# Patient Record
Sex: Female | Born: 1955
Health system: Southern US, Community
[De-identification: ages and names within clinical notes are randomized; demographics above are authoritative.]

## PROBLEM LIST (undated history)

## (undated) DIAGNOSIS — H269 Unspecified cataract: Secondary | ICD-10-CM

## (undated) DIAGNOSIS — I10 Essential (primary) hypertension: Secondary | ICD-10-CM

## (undated) DIAGNOSIS — H409 Unspecified glaucoma: Secondary | ICD-10-CM

## (undated) DIAGNOSIS — E119 Type 2 diabetes mellitus without complications: Secondary | ICD-10-CM

## (undated) DIAGNOSIS — R011 Cardiac murmur, unspecified: Secondary | ICD-10-CM

## (undated) DIAGNOSIS — E7800 Pure hypercholesterolemia, unspecified: Secondary | ICD-10-CM

## (undated) DIAGNOSIS — H543 Unqualified visual loss, both eyes: Secondary | ICD-10-CM

## (undated) DIAGNOSIS — K219 Gastro-esophageal reflux disease without esophagitis: Secondary | ICD-10-CM

## (undated) HISTORY — DX: Gastro-esophageal reflux disease without esophagitis: K21.9

## (undated) HISTORY — PX: TUBAL LIGATION: SHX77

## (undated) HISTORY — DX: Unspecified glaucoma: H40.9

## (undated) HISTORY — DX: Cardiac murmur, unspecified: R01.1

## (undated) HISTORY — DX: Unqualified visual loss, both eyes: H54.3

## (undated) HISTORY — DX: Pure hypercholesterolemia, unspecified: E78.00

## (undated) HISTORY — DX: Type 2 diabetes mellitus without complications: E11.9

## (undated) HISTORY — DX: Unspecified cataract: H26.9

## (undated) HISTORY — PX: CATARACT EXTRACTION, BILATERAL: SHX1313

---

## 1997-05-28 ENCOUNTER — Ambulatory Visit (HOSPITAL_COMMUNITY): Admission: RE | Admit: 1997-05-28 | Discharge: 1997-05-28 | Payer: Self-pay | Admitting: Internal Medicine

## 1997-10-31 ENCOUNTER — Ambulatory Visit (HOSPITAL_COMMUNITY): Admission: RE | Admit: 1997-10-31 | Discharge: 1997-10-31 | Payer: Self-pay | Admitting: Obstetrics and Gynecology

## 1998-04-22 ENCOUNTER — Encounter: Payer: Self-pay | Admitting: Internal Medicine

## 1998-04-22 ENCOUNTER — Ambulatory Visit (HOSPITAL_COMMUNITY): Admission: RE | Admit: 1998-04-22 | Discharge: 1998-04-22 | Payer: Self-pay | Admitting: Internal Medicine

## 1998-05-05 ENCOUNTER — Encounter: Payer: Self-pay | Admitting: Obstetrics and Gynecology

## 1998-05-05 ENCOUNTER — Ambulatory Visit (HOSPITAL_COMMUNITY): Admission: RE | Admit: 1998-05-05 | Discharge: 1998-05-05 | Payer: Self-pay | Admitting: Obstetrics and Gynecology

## 1999-04-26 ENCOUNTER — Encounter: Payer: Self-pay | Admitting: Geriatric Medicine

## 1999-04-26 ENCOUNTER — Ambulatory Visit (HOSPITAL_COMMUNITY): Admission: RE | Admit: 1999-04-26 | Discharge: 1999-04-26 | Payer: Self-pay | Admitting: Geriatric Medicine

## 2000-06-26 ENCOUNTER — Ambulatory Visit (HOSPITAL_COMMUNITY): Admission: RE | Admit: 2000-06-26 | Discharge: 2000-06-26 | Payer: Self-pay | Admitting: Internal Medicine

## 2000-06-26 ENCOUNTER — Encounter: Payer: Self-pay | Admitting: Internal Medicine

## 2001-01-12 ENCOUNTER — Other Ambulatory Visit: Admission: RE | Admit: 2001-01-12 | Discharge: 2001-01-12 | Payer: Self-pay | Admitting: Obstetrics and Gynecology

## 2001-07-02 ENCOUNTER — Ambulatory Visit (HOSPITAL_COMMUNITY): Admission: RE | Admit: 2001-07-02 | Discharge: 2001-07-02 | Payer: Self-pay | Admitting: Internal Medicine

## 2001-07-02 ENCOUNTER — Encounter: Payer: Self-pay | Admitting: Internal Medicine

## 2002-01-16 ENCOUNTER — Other Ambulatory Visit: Admission: RE | Admit: 2002-01-16 | Discharge: 2002-01-16 | Payer: Self-pay | Admitting: Obstetrics and Gynecology

## 2002-08-13 ENCOUNTER — Encounter: Payer: Self-pay | Admitting: Internal Medicine

## 2002-08-13 ENCOUNTER — Ambulatory Visit (HOSPITAL_COMMUNITY): Admission: RE | Admit: 2002-08-13 | Discharge: 2002-08-13 | Payer: Self-pay | Admitting: Internal Medicine

## 2003-01-28 ENCOUNTER — Other Ambulatory Visit: Admission: RE | Admit: 2003-01-28 | Discharge: 2003-01-28 | Payer: Self-pay | Admitting: Obstetrics and Gynecology

## 2003-07-16 ENCOUNTER — Encounter: Admission: RE | Admit: 2003-07-16 | Discharge: 2003-07-16 | Payer: Self-pay | Admitting: Internal Medicine

## 2003-08-15 ENCOUNTER — Other Ambulatory Visit: Admission: RE | Admit: 2003-08-15 | Discharge: 2003-08-15 | Payer: Self-pay | Admitting: *Deleted

## 2004-02-05 ENCOUNTER — Other Ambulatory Visit: Admission: RE | Admit: 2004-02-05 | Discharge: 2004-02-05 | Payer: Self-pay | Admitting: Obstetrics and Gynecology

## 2004-08-09 ENCOUNTER — Ambulatory Visit (HOSPITAL_COMMUNITY): Admission: RE | Admit: 2004-08-09 | Discharge: 2004-08-09 | Payer: Self-pay | Admitting: Internal Medicine

## 2005-10-18 ENCOUNTER — Ambulatory Visit (HOSPITAL_COMMUNITY): Admission: RE | Admit: 2005-10-18 | Discharge: 2005-10-18 | Payer: Self-pay | Admitting: Internal Medicine

## 2007-05-24 ENCOUNTER — Ambulatory Visit (HOSPITAL_COMMUNITY): Admission: RE | Admit: 2007-05-24 | Discharge: 2007-05-24 | Payer: Self-pay | Admitting: Internal Medicine

## 2009-08-11 ENCOUNTER — Ambulatory Visit (HOSPITAL_COMMUNITY): Admission: RE | Admit: 2009-08-11 | Discharge: 2009-08-11 | Payer: Self-pay | Admitting: Internal Medicine

## 2009-08-13 ENCOUNTER — Encounter: Admission: RE | Admit: 2009-08-13 | Discharge: 2009-08-13 | Payer: Self-pay | Admitting: Internal Medicine

## 2010-01-17 HISTORY — PX: EYE SURGERY: SHX253

## 2010-02-07 ENCOUNTER — Encounter: Payer: Self-pay | Admitting: Internal Medicine

## 2010-08-24 ENCOUNTER — Encounter: Payer: Self-pay | Admitting: Dietician

## 2010-08-26 NOTE — Progress Notes (Deleted)
  Medical Nutrition Therapy:  Appt start time: {Time; Appointment:21385} end time:  {Time; Appointment:21385}.   Assessment:  Primary concerns today: ***.   MEDICATIONS: ***   DIETARY INTAKE:  Usual eating pattern includes *** meals and *** snacks per day.  Everyday foods include ***.  Avoided foods include ***.    24-hr recall:  B ( AM): ***  Snk ( AM): ***  L ( PM): *** Snk ( PM): *** D ( PM): *** Snk ( PM): ***  Usual physical activity: ***  Estimated energy needs: *** calories *** g carbohydrates *** g protein *** g fat  Progress Towards Goal(s):  {Desc; Goals Progress:21388}.   Nutritional Diagnosis:  {CHL AMB NUTRITIONAL [phone]}    Intervention:  Nutrition ***.  Monitoring/Evaluation:  Dietary intake, exercise, ***, and body weight {follow up:15908}.

## 2013-01-17 DIAGNOSIS — I639 Cerebral infarction, unspecified: Secondary | ICD-10-CM

## 2013-01-17 HISTORY — DX: Cerebral infarction, unspecified: I63.9

## 2013-05-17 ENCOUNTER — Encounter (INDEPENDENT_AMBULATORY_CARE_PROVIDER_SITE_OTHER): Payer: 59 | Admitting: Ophthalmology

## 2013-05-17 DIAGNOSIS — E1139 Type 2 diabetes mellitus with other diabetic ophthalmic complication: Secondary | ICD-10-CM

## 2013-05-17 DIAGNOSIS — E11319 Type 2 diabetes mellitus with unspecified diabetic retinopathy without macular edema: Secondary | ICD-10-CM

## 2013-05-17 DIAGNOSIS — H4310 Vitreous hemorrhage, unspecified eye: Secondary | ICD-10-CM

## 2013-05-17 DIAGNOSIS — H43819 Vitreous degeneration, unspecified eye: Secondary | ICD-10-CM

## 2013-05-17 DIAGNOSIS — E1165 Type 2 diabetes mellitus with hyperglycemia: Secondary | ICD-10-CM

## 2013-06-14 ENCOUNTER — Encounter (INDEPENDENT_AMBULATORY_CARE_PROVIDER_SITE_OTHER): Payer: 59 | Admitting: Ophthalmology

## 2013-06-14 DIAGNOSIS — E11311 Type 2 diabetes mellitus with unspecified diabetic retinopathy with macular edema: Secondary | ICD-10-CM

## 2013-06-14 DIAGNOSIS — H43819 Vitreous degeneration, unspecified eye: Secondary | ICD-10-CM

## 2013-06-14 DIAGNOSIS — E1165 Type 2 diabetes mellitus with hyperglycemia: Secondary | ICD-10-CM

## 2013-06-14 DIAGNOSIS — E1139 Type 2 diabetes mellitus with other diabetic ophthalmic complication: Secondary | ICD-10-CM

## 2013-06-14 DIAGNOSIS — E11359 Type 2 diabetes mellitus with proliferative diabetic retinopathy without macular edema: Secondary | ICD-10-CM

## 2013-07-12 ENCOUNTER — Encounter (INDEPENDENT_AMBULATORY_CARE_PROVIDER_SITE_OTHER): Payer: 59 | Admitting: Ophthalmology

## 2013-07-12 DIAGNOSIS — E1139 Type 2 diabetes mellitus with other diabetic ophthalmic complication: Secondary | ICD-10-CM

## 2013-07-12 DIAGNOSIS — H43819 Vitreous degeneration, unspecified eye: Secondary | ICD-10-CM

## 2013-07-12 DIAGNOSIS — E11311 Type 2 diabetes mellitus with unspecified diabetic retinopathy with macular edema: Secondary | ICD-10-CM

## 2013-07-12 DIAGNOSIS — E1165 Type 2 diabetes mellitus with hyperglycemia: Secondary | ICD-10-CM

## 2013-07-12 DIAGNOSIS — E11359 Type 2 diabetes mellitus with proliferative diabetic retinopathy without macular edema: Secondary | ICD-10-CM

## 2013-07-12 DIAGNOSIS — H4310 Vitreous hemorrhage, unspecified eye: Secondary | ICD-10-CM

## 2013-07-26 ENCOUNTER — Other Ambulatory Visit (INDEPENDENT_AMBULATORY_CARE_PROVIDER_SITE_OTHER): Payer: 59 | Admitting: Ophthalmology

## 2013-07-26 DIAGNOSIS — H3581 Retinal edema: Secondary | ICD-10-CM

## 2013-08-02 ENCOUNTER — Other Ambulatory Visit (INDEPENDENT_AMBULATORY_CARE_PROVIDER_SITE_OTHER): Payer: 59 | Admitting: Ophthalmology

## 2013-08-02 DIAGNOSIS — E1139 Type 2 diabetes mellitus with other diabetic ophthalmic complication: Secondary | ICD-10-CM

## 2013-08-02 DIAGNOSIS — E11359 Type 2 diabetes mellitus with proliferative diabetic retinopathy without macular edema: Secondary | ICD-10-CM

## 2013-08-02 DIAGNOSIS — E1165 Type 2 diabetes mellitus with hyperglycemia: Secondary | ICD-10-CM

## 2013-08-23 ENCOUNTER — Other Ambulatory Visit (INDEPENDENT_AMBULATORY_CARE_PROVIDER_SITE_OTHER): Payer: 59 | Admitting: Ophthalmology

## 2013-08-23 DIAGNOSIS — E11359 Type 2 diabetes mellitus with proliferative diabetic retinopathy without macular edema: Secondary | ICD-10-CM

## 2013-08-23 DIAGNOSIS — E1139 Type 2 diabetes mellitus with other diabetic ophthalmic complication: Secondary | ICD-10-CM

## 2013-08-23 DIAGNOSIS — E1165 Type 2 diabetes mellitus with hyperglycemia: Secondary | ICD-10-CM

## 2013-08-30 ENCOUNTER — Other Ambulatory Visit (INDEPENDENT_AMBULATORY_CARE_PROVIDER_SITE_OTHER): Payer: 59 | Admitting: Ophthalmology

## 2013-08-30 DIAGNOSIS — E11359 Type 2 diabetes mellitus with proliferative diabetic retinopathy without macular edema: Secondary | ICD-10-CM

## 2013-08-30 DIAGNOSIS — E1165 Type 2 diabetes mellitus with hyperglycemia: Secondary | ICD-10-CM

## 2013-08-30 DIAGNOSIS — E1139 Type 2 diabetes mellitus with other diabetic ophthalmic complication: Secondary | ICD-10-CM

## 2013-10-25 ENCOUNTER — Encounter (INDEPENDENT_AMBULATORY_CARE_PROVIDER_SITE_OTHER): Payer: 59 | Admitting: Ophthalmology

## 2013-10-25 DIAGNOSIS — E10311 Type 1 diabetes mellitus with unspecified diabetic retinopathy with macular edema: Secondary | ICD-10-CM

## 2013-10-25 DIAGNOSIS — E10351 Type 1 diabetes mellitus with proliferative diabetic retinopathy with macular edema: Secondary | ICD-10-CM

## 2013-10-31 ENCOUNTER — Other Ambulatory Visit: Payer: Self-pay | Admitting: Family Medicine

## 2013-10-31 DIAGNOSIS — H5461 Unqualified visual loss, right eye, normal vision left eye: Secondary | ICD-10-CM

## 2013-11-01 ENCOUNTER — Ambulatory Visit
Admission: RE | Admit: 2013-11-01 | Discharge: 2013-11-01 | Disposition: A | Discharge status: Home / Self Care | Payer: 59 | Source: Ambulatory Visit | Attending: Family Medicine | Admitting: Family Medicine

## 2013-11-01 DIAGNOSIS — H5461 Unqualified visual loss, right eye, normal vision left eye: Secondary | ICD-10-CM

## 2013-11-04 ENCOUNTER — Encounter: Payer: Self-pay | Admitting: *Deleted

## 2013-11-05 ENCOUNTER — Encounter (INDEPENDENT_AMBULATORY_CARE_PROVIDER_SITE_OTHER): Payer: Self-pay

## 2013-11-05 ENCOUNTER — Encounter: Payer: Self-pay | Admitting: Neurology

## 2013-11-05 ENCOUNTER — Ambulatory Visit (INDEPENDENT_AMBULATORY_CARE_PROVIDER_SITE_OTHER): Payer: 59 | Admitting: Neurology

## 2013-11-05 ENCOUNTER — Ambulatory Visit
Admission: RE | Admit: 2013-11-05 | Discharge: 2013-11-05 | Disposition: A | Discharge status: Home / Self Care | Payer: 59 | Source: Ambulatory Visit | Attending: Family Medicine | Admitting: Family Medicine

## 2013-11-05 ENCOUNTER — Ambulatory Visit: Admission: RE | Admit: 2013-11-05 | Payer: 59 | Source: Ambulatory Visit

## 2013-11-05 VITALS — BP 142/80 | HR 85 | Ht 67.0 in | Wt 180.0 lb

## 2013-11-05 DIAGNOSIS — E11349 Type 2 diabetes mellitus with severe nonproliferative diabetic retinopathy without macular edema: Secondary | ICD-10-CM

## 2013-11-05 DIAGNOSIS — H3411 Central retinal artery occlusion, right eye: Secondary | ICD-10-CM

## 2013-11-05 DIAGNOSIS — H5461 Unqualified visual loss, right eye, normal vision left eye: Secondary | ICD-10-CM

## 2013-11-05 DIAGNOSIS — E785 Hyperlipidemia, unspecified: Secondary | ICD-10-CM

## 2013-11-05 NOTE — Patient Instructions (Signed)
-   continue ASA 325 mg and crestor for stroke prevention - DM control is the key - Please follow up with your primary care physician for stroke risk factor modification. - please let me know the result of MRI and carotid doppler and echo and we will give you more recommendations after that - continue to follow up with your eye doctor for visual improvement - follow up in 2 weeks.

## 2013-11-05 NOTE — Progress Notes (Signed)
NEUROLOGY CLINIC NEW PATIENT NOTE  NAME: Adrienne [name] DOB: 1955/08/26  I saw Adrienne [name] as a new patient in the neurovascular clinic today regarding  Chief Complaint  Patient presents with  . New Evaluation    RM 4  . Cerebrovascular Accident  .  HPI: Adrienne [name] is a 58 y.o. female with PMH of DM and HLD who presents as a new patient for right eye vision loss.   Patient stated that the on October 8, she was working and suddenly she felt right eye blurry vision, which lasted for 2 hours and cleared up. However it returned in the evening, she did not seek for any medical attention and went to sleep. When she woke up in the morning second day, she still had the right eye blurry vision as well as right ptosis. She went to see her ophthalmologist Dr. Zigmund Daniel, and her ptosis gradually getting better during the clinic visit. Dr. Zigmund Daniel refer her to see could not ophthalmologist in [place], and was told to have a CRAO. She was recommended to have further work up for stroke. Her PCP has ordered MRI and MRA, carotid doppler and echo with stroke lab. She was sent here for further evaluation. For these days, she stated that that her right high blurry vision still continues, with intermittent right orbit pain. Denies any speech difficulty, weakness, numbness, loss of consciousness, or headache.  She had history of diabetes since 1997, she is on insulin with Lantus and night and pre-meal insulin at dinner. She had bilateral diabetic retinopathy, left eye worse than right eye, at baseline she has poor vision on the left eye, but relatively good on the right side. She had later surgery both eye in 2012 for retinopathy.  She had a hyperlipidemia, on Crestor. She denies hypertension, her blood pressure 142/80 today.  Family history, father has carotid disease decreased status post bypass surgery, mom had stroke, heart attack. Siblings have diabetes and hypertension.  She has quit smoking  and alcohol in 1997, use of cocaine a couple times in the past, denies any other illicit drug use.   She had another appointment with her ophthalmology [facility] on October 27.  10/27 see in [place]  Past Medical History  Diagnosis Date  . Diabetes   . High cholesterol    Past Surgical History  Procedure Laterality Date  . Cesarean section      Twins, Breech  . Tubal ligation Bilateral   . Eye surgery Bilateral 2012    LASER, CATARACT   Family History  Problem Relation Age of Onset  . Diabetes Father   . Hypertension Father   . Dementia Mother   . Hypertension Sister   . Diabetes Sister   . Diabetes Sister   . Heart disease Sister    Current Outpatient Prescriptions  Medication Sig Dispense Refill  . APIDRA 100 UNIT/ML injection Inject 100 Units into the skin 3 (three) times daily with meals.       Marland Kitchen aspirin EC 81 MG tablet Take 81 mg by mouth daily.       . insulin glargine (LANTUS) 100 UNIT/ML injection Inject 100 Units into the skin at bedtime.      . JENTADUETO 2.05-998 MG TABS Take by mouth daily.       . rosuvastatin (CRESTOR) 10 MG tablet Take 10 mg by mouth daily.        No current facility-administered medications for this visit.   Allergies  Allergen Reactions  . Codeine  Itching  . Hydrocodone Itching  . Oxycodone Itching  . Penicillin G Itching   History   Social History  . Marital Status: Married    Spouse Name: N/A    Number of Children: 3  . Years of Education: 12   Occupational History  .     Social History Main Topics  . Smoking status: Former Research scientist (life sciences)  . Smokeless tobacco: Never Used     Comment: QUIT IN 36  . Alcohol Use: No     Comment: QUIT IN 1987  . Drug Use: 2.00 per week     Comment: QUIT 1980  . Sexual Activity: Not on file   Other Topics Concern  . Not on file   Social History Narrative  . No narrative on file    Review of Systems Full 14 system review of systems performed and notable only for those listed, all others are  neg:  Constitutional: fatigue Cardiovascular:   chest painEar/Nose/Throat: N/A  Skin: N/A  Eyes: revision, double vision, loss of vision, eye pain   Respiratory: cough, runny nose   Gastroitestinal: N/A  Hematology/Lymphatic: N/A  Endocrine: N/A  Musculoskeletal: N/A  Allergy/Immunology:  skin sensitivity  Neurological: numbness   Psychiatric: not enough sleep, sleepiness    Physical Exam  Filed Vitals:   11/05/13 0810  BP: 142/80  Pulse: 85    General - Well nourished, well developed, in no apparent distress.  Ophthalmologic - Sharp disc margin bilaterally. right side disc pale, no retinal artery or vein seen, chronic retinal hemorrhage. Left side disc pale, retinal artery and vein were seen but some branches are missing.   Cardiovascular - Regular rate and rhythm with no murmur. Carotid pulses were 2+ without bruits .   Neck - supple, no nuchal rigidity .  Mental Status -  Level of arousal and orientation to time, place, and person were intact. Language including expression, naming, repetition, comprehension was assessed and found intact.  Cranial Nerves II - XII - II - Visual field intact OU, right visual acuity finger counting, left 20/400. III, IV, VI - Extraocular movements intact. V - Facial sensation intact bilaterally. VII - Facial movement intact bilaterally. VIII - Hearing & vestibular intact bilaterally. X - Palate elevates symmetrically. XI - Chin turning & shoulder shrug intact bilaterally. XII - Tongue protrusion intact.  Motor Strength - The patient's strength was normal in all extremities and pronator drift was absent.  Bulk was normal and fasciculations were absent.   Motor Tone - Muscle tone was assessed at the neck and appendages and was normal.  Reflexes - The patient's reflexes were normal in all extremities and she had no pathological reflexes.  Sensory - Light touch, temperature/pinprick were assessed and were normal.    Coordination - The  patient had normal movements in the hands and feet with no ataxia or dysmetria.  Tremor was absent.  Gait and Station - slow cautious gait due to decreased vision.   Imaging none  Lab Review CMP normal, CBC normal, ESR 5, LDL 86, A1C 12.1, CRP 1.8 (10/29/13)    Assessment and Plan:   In summary, Adrienne [name] is a 59 y.o. female with PMH of diabetes, hyperlipidemia presents  acute onset right decreased vision. 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 Fungal examination today also showed sharp disc bilaterally but no retinal artery away in seen on the right with retinal central artery branches missing on  the left. Her A1c was 12.1, diabetes not in good control.  She needs further stroke workup. Will followup with her imaging result today.   - Continue aspirin 325 mg and Crestor for stroke prevention - Aggressive diabetic control - Followup with a PCP closely for stroke risk factor modification - Will followup with MRI brain, carotid Doppler, 2-D echo, and make further recommendations - Keep appointment with ophthalmologist in [place] on October 27 - Followup in 2 weeks  I recommend aggressive blood pressure control with a goal <130/80 mm Hg.  Lipids should be managed intensively, with a goal LDL < 70 mg/dL.  I encouraged the patient to discuss these important issues with her primary care physician.  I counseled the patient on measures to reduce stroke risk, including the importance of medication compliance, risk factor control, exercise, healthy diet, and avoidance of smoking.  I reviewed stroke warning signs and symptoms and appropriate actions to take if such occurs.   Thank you very much for the opportunity to participate in the care of this patient.  Please do not hesitate to call if any questions or concerns arise.  Orders Placed This Encounter  Procedures  .  Ambulatory referral to Occupational Therapy    Referral Priority:  Routine    Referral Type:  Occupational Therapy    Referral Reason:  Specialty Services Required    Requested Specialty:  Occupational Therapy    Number of Visits Requested:  1    No orders of the defined types were placed in this encounter.    Patient Instructions  - continue ASA 325 mg and crestor for stroke prevention - DM control is the key - Please follow up with your primary care physician for stroke risk factor modification. - please let me know the result of MRI and carotid doppler and echo and we will give you more recommendations after that - continue to follow up with your eye doctor for visual improvement - follow up in 2 weeks.   Rosalin Hawking, MD PhD [facility] [address] [place], [place] [address] [phone]

## 2013-11-08 ENCOUNTER — Other Ambulatory Visit: Payer: 59

## 2013-11-19 ENCOUNTER — Encounter: Payer: Self-pay | Admitting: Neurology

## 2013-11-19 ENCOUNTER — Ambulatory Visit (INDEPENDENT_AMBULATORY_CARE_PROVIDER_SITE_OTHER): Payer: 59 | Admitting: Neurology

## 2013-11-19 VITALS — BP 125/77 | HR 85 | Ht 66.0 in | Wt 179.6 lb

## 2013-11-19 DIAGNOSIS — E7849 Other hyperlipidemia: Secondary | ICD-10-CM | POA: Insufficient documentation

## 2013-11-19 DIAGNOSIS — E119 Type 2 diabetes mellitus without complications: Secondary | ICD-10-CM | POA: Insufficient documentation

## 2013-11-19 DIAGNOSIS — E785 Hyperlipidemia, unspecified: Secondary | ICD-10-CM

## 2013-11-19 DIAGNOSIS — E11349 Type 2 diabetes mellitus with severe nonproliferative diabetic retinopathy without macular edema: Secondary | ICD-10-CM

## 2013-11-19 DIAGNOSIS — H3411 Central retinal artery occlusion, right eye: Secondary | ICD-10-CM

## 2013-11-19 MED ORDER — ALPRAZOLAM 0.5 MG PO TABS: 0.5000 mg | ORAL_TABLET | ORAL | Status: DC | PRN

## 2013-11-19 NOTE — Progress Notes (Signed)
NEUROLOGY CLINIC FOLLOW UP NOTE  NAME: Adrienne [name] DOB: [birth year] History from: pt and chart  History summary: Adrienne [name] is a 58 y.o. female with PMH of DM and HLD who was first seen on 11/05/13 as a new patient for right eye vision loss. Patient stated that the on October 8, she was working and suddenly she felt right eye blurry vision, which lasted for 2 hours and cleared up. However it returned in the evening, she did not seek for any medical attention and went to sleep. When she woke up in the morning second day, she still had the right eye blurry vision as well as right ptosis. She went to see her ophthalmologist Dr. Zigmund Daniel, and her ptosis gradually getting better during the clinic visit. Dr. Zigmund Daniel refer her to see could not ophthalmologist in [employer] [employer], and was told to have a CRAO. She was recommended to have further work up for stroke. Her PCP has ordered MRI and MRA, carotid doppler and echo with stroke lab. She was sent here for further evaluation. For these days, she stated that that her right high blurry vision still continues, with intermittent right orbit pain. Denies any speech difficulty, weakness, numbness, loss of consciousness, or headache. She had history of diabetes since 1997, she is on insulin with Lantus and night and pre-meal insulin at dinner. She had bilateral diabetic retinopathy, left eye worse than right eye, at baseline she has poor vision on the left eye, but relatively good on the right side. She had later surgery both eye in 2012 for retinopathy. She had a hyperlipidemia, on Crestor. She denies hypertension, her blood pressure 142/80 today. Family history, father has carotid disease decreased status post bypass surgery, mom had stroke, heart attack. Siblings have diabetes and hypertension. She has quit smoking and alcohol in 1997, use of cocaine a couple times in the past, denies any other illicit drug use.   Interval history: During the interval time,  she has been doing the same. She stated that her right eye vision has not improved or worsened. She followed up in [place] ophthalmology and got left eye injection for DM retinopathy. She was also seen a neurologist in [place] for second opinion and was told no need to follow with neurology any more. She has not had MRI done yet due to anxiety and claustrophobia. She had carotid doppler which is unremarkable. Still waiting for 2D echo. She stated that her sugar level at 200s and she saw her PCP 4 days ago and increased on lantus. BP good today in clinic 125/77.  Past Medical History  Diagnosis Date  . Diabetes   . High cholesterol    Past Surgical History  Procedure Laterality Date  . Cesarean section      Twins, Breech  . Tubal ligation Bilateral   . Eye surgery Bilateral 2012    LASER, CATARACT   Family History  Problem Relation Age of Onset  . Diabetes Father   . Hypertension Father   . Dementia Mother   . Hypertension Sister   . Diabetes Sister   . Diabetes Sister   . Heart disease Sister    Current Outpatient Prescriptions  Medication Sig Dispense Refill  . APIDRA 100 UNIT/ML injection Inject 100 Units into the skin 3 (three) times daily with meals.     Marland Kitchen aspirin EC 81 MG tablet Take 81 mg by mouth daily.     . clopidogrel (PLAVIX) 75 MG tablet Take by mouth.    Marland Kitchen  insulin glargine (LANTUS) 100 UNIT/ML injection Inject 100 Units into the skin at bedtime.    . JENTADUETO 2.05-998 MG TABS Take by mouth daily.     Marland Kitchen LANTUS SOLOSTAR 100 UNIT/ML Solostar Pen 28 Units.    Marland Kitchen losartan (COZAAR) 50 MG tablet Take by mouth.    . Loteprednol Etabonate (LOTEMAX) 0.5 % GEL     . rosuvastatin (CRESTOR) 10 MG tablet Take 10 mg by mouth daily.     . rosuvastatin (CRESTOR) 10 MG tablet Take by mouth.    . ALPRAZolam (XANAX) 0.5 MG tablet Take 1 tablet (0.5 mg total) by mouth as needed for anxiety. 3 tablet 0   No current facility-administered medications for this visit.   Allergies  Allergen  Reactions  . Codeine Itching  . Hydrocodone Itching  . Oxycodone Itching  . Penicillin G Itching   History   Social History  . Marital Status: Married    Spouse Name: N/A    Number of Children: 3  . Years of Education: 12   Occupational History  .     Social History Main Topics  . Smoking status: Former Research scientist (life sciences)  . Smokeless tobacco: Never Used     Comment: QUIT IN 62  . Alcohol Use: No     Comment: QUIT IN 1987  . Drug Use: 2.00 per week     Comment: QUIT 1980  . Sexual Activity: Not on file   Other Topics Concern  . Not on file   Social History Narrative   Patient is married with 3 children.   Patient is right handed.   Patient has 12 th grade education.   Patient has not been drinking caffeine.    Review of Systems Full 14 system review of systems performed and notable only for those listed, all others are neg:  Constitutional: fatigue Cardiovascular: chest pain Ear/Nose/Throat: N/A  Skin: N/A  Eyes: double vision, loss of vision, eye pain   Respiratory: cough, runny nose   Gastroitestinal: N/A  Hematology/Lymphatic: N/A  Endocrine: N/A  Musculoskeletal: N/A  Allergy/Immunology:  skin sensitivity  Neurological: HA Psychiatric: not enough sleep, sleepiness    Physical Exam  Filed Vitals:   11/19/13 1021  BP: 125/77  Pulse: 85    General - Well nourished, well developed, in no apparent distress.  Ophthalmologic - Sharp disc margin bilaterally. right side disc pale, there are two branches of retinal artery were seen today, chronic retinal hemorrhage. Left side disc pale, retinal artery and vein were seen but some branches are missing.   Cardiovascular - Regular rate and rhythm with no murmur. Carotid pulses were 2+ without bruits .   Neck - supple, no nuchal rigidity .  Mental Status -  Level of arousal and orientation to time, place, and person were intact. Language including expression, naming, repetition, comprehension was assessed and found  intact.  Cranial Nerves II - XII - II - Visual field intact OU, right visual acuity finger counting, left 20/400. III, IV, VI - Extraocular movements intact. V - Facial sensation intact bilaterally. VII - Facial movement intact bilaterally. VIII - Hearing & vestibular intact bilaterally. X - Palate elevates symmetrically. XI - Chin turning & shoulder shrug intact bilaterally. XII - Tongue protrusion intact.  Motor Strength - The patient's strength was normal in all extremities and pronator drift was absent.  Bulk was normal and fasciculations were absent.   Motor Tone - Muscle tone was assessed at the neck and appendages and was normal.  Reflexes - The patient's reflexes were normal in all extremities and she had no pathological reflexes.  Sensory - Light touch, temperature/pinprick were assessed and were normal.    Coordination - The patient had normal movements in the hands and feet with no ataxia or dysmetria.  Tremor was absent.  Gait and Station - slow cautious gait due to decreased vision.   Imaging CUS - 1. Very minimal amount of right-sided atherosclerotic plaque, not resulting in a hemodynamically significant stenosis. 2. Normal sonographic evaluation of the left carotid system.  Lab Review CMP normal, CBC normal, ESR 5, LDL 86, A1C 12.1, CRP 1.8 (10/29/13)    Assessment and Plan:   In summary, Adrienne [name] is a 58 y.o. female with PMH of diabetes, hyperlipidemia presents  acute onset right decreased vision. 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 Fungal examination today also showed sharp disc bilaterally but improved retinal artery branches than last visit. CUS normal ruled out carotid dissection, ESR ruled out GCA. MRI still pending, will do open MRI and MRA. Her A1c was 12.1, diabetes not in good control. Needs close follow up with PCP.  -  Continue aspirin  and Crestor for stroke prevention - Aggressive diabetic control - Followup with a PCP closely for glucose control - Follow up with your primary care physician for stroke risk factor modification. Recommend maintain blood pressure goal <130/80, diabetes with hemoglobin A1c goal below 6.5% and lipids with LDL cholesterol goal below 70 mg/dL.  - will order open MRI and MRA.  - may consider TCD to look at ophthalmic artery if MRA not see well - will follow up for 2D echo - may consider OT after stroke work up. - follow up with ophthalmologist for vision loss - Followup in 4 weeks  Orders Placed This Encounter  Procedures  . MR Brain Wo Contrast    Open MRI please, pt has failed twice with regular MRI. Thanks.    Standing Status: Future     Number of Occurrences:      Standing Expiration Date: 01/21/2015    Scheduling Instructions:     Open MRI please, pt has failed twice with regular MRI. Thanks.    Order Specific Question:  Reason for Exam (SYMPTOM  OR DIAGNOSIS REQUIRED)    Answer:  vision loss    Order Specific Question:  Preferred imaging location?    Answer:  Internal    Order Specific Question:  Does the patient have a pacemaker or implanted devices?    Answer:  No    Order Specific Question:  What is the patient's sedation requirement?    Answer:  No Sedation  . MR MRA HEAD WO CONTRAST    Open MRI please, pt has failed twice with regular MRI. Thanks.    Standing Status: Future     Number of Occurrences:      Standing Expiration Date: 01/21/2015    Scheduling Instructions:     Open MRI please, pt has failed twice with regular MRI. Thanks.    Order Specific Question:  Reason for Exam (SYMPTOM  OR DIAGNOSIS REQUIRED)    Answer:  vision loss    Order Specific Question:  Preferred imaging location?    Answer:  Internal    Order Specific Question:  Does the patient have a pacemaker or implanted devices?    Answer:  No    Order Specific Question:  What is the  patient's  sedation requirement?    Answer:  No Sedation    Meds ordered this encounter                                       . ALPRAZolam (XANAX) 0.5 MG tablet    Sig: Take 1 tablet (0.5 mg total) by mouth as needed for anxiety.    Dispense:  3 tablet    Refill:  0    Patient Instructions  - continue ASA and crestor for stroke prevention - will order open MRI and MRA to evaluate brain structure - wait for 2D echo result - continue insulin therapy for DM control - Follow up with your primary care physician for stroke risk factor modification. Recommend maintain blood pressure goal <130/80, diabetes with hemoglobin A1c goal below 6.5% and lipids with LDL cholesterol goal below 70 mg/dL.  - continue follow up with ophthalmology for vision loss - follow up in one month  Rosalin Hawking, MD PhD [facility] [address], [place], [place] [address] [phone]

## 2013-11-19 NOTE — Patient Instructions (Signed)
-   continue ASA and crestor for stroke prevention - will order open MRI and MRA to evaluate brain structure - wait for 2D echo result - continue insulin therapy for DM control - Follow up with your primary care physician for stroke risk factor modification. Recommend maintain blood pressure goal <130/80, diabetes with hemoglobin A1c goal below 6.5% and lipids with LDL cholesterol goal below 70 mg/dL.  - continue follow up with ophthalmology for vision loss - follow up in one month

## 2013-12-16 ENCOUNTER — Ambulatory Visit: Payer: 59 | Admitting: Neurology

## 2013-12-19 ENCOUNTER — Encounter: Payer: Self-pay | Admitting: Neurology

## 2013-12-19 ENCOUNTER — Ambulatory Visit (INDEPENDENT_AMBULATORY_CARE_PROVIDER_SITE_OTHER): Payer: 59 | Admitting: Neurology

## 2013-12-19 VITALS — BP 147/91 | HR 78 | Ht 66.0 in | Wt 190.2 lb

## 2013-12-19 DIAGNOSIS — E785 Hyperlipidemia, unspecified: Secondary | ICD-10-CM

## 2013-12-19 DIAGNOSIS — E11349 Type 2 diabetes mellitus with severe nonproliferative diabetic retinopathy without macular edema: Secondary | ICD-10-CM

## 2013-12-19 DIAGNOSIS — H3411 Central retinal artery occlusion, right eye: Secondary | ICD-10-CM

## 2013-12-19 DIAGNOSIS — I10 Essential (primary) hypertension: Secondary | ICD-10-CM

## 2013-12-19 NOTE — Patient Instructions (Addendum)
-   continue ASA and plavix and crestor for stroke prevention - follow up with Dr. Einar Gip for TEE and please let Dr. Einar Gip fax over the result to [place] - will contact Dr. Einar Gip to see how long you need to be on both ASA and plavix - Follow up with your primary care physician for stroke risk factor modification. Recommend maintain blood pressure goal <130/80, diabetes with hemoglobin A1c goal below 6.5% and lipids with LDL cholesterol goal below 70 mg/dL.  - continue to follow up with ophthalmology in [place] - check glucose at home - ask Dr. Karren Burly for a prescription for home BP monitoring device and check for BP at home. - follow up in 2 months.

## 2013-12-20 DIAGNOSIS — I10 Essential (primary) hypertension: Secondary | ICD-10-CM | POA: Insufficient documentation

## 2013-12-20 NOTE — Progress Notes (Signed)
NEUROLOGY CLINIC FOLLOW UP NOTE  NAME: Adrienne [name] DOB: 06-Mar-1955 History from: pt and chart  History summary: Adrienne [name] is a 58 y.o. female with PMH of DM and HLD who was first seen on 11/05/13 as a new patient for right eye vision loss. Patient stated that the on October 8, she was working and suddenly she felt right eye blurry vision, which lasted for 2 hours and cleared up. However it returned in the evening, she did not seek for any medical attention and went to sleep. When she woke up in the morning second day, she still had the right eye blurry vision as well as right ptosis. She went to see her ophthalmologist Dr. Zigmund Daniel, and her ptosis gradually getting better during the clinic visit. Dr. Zigmund Daniel refer her to see could not ophthalmologist in [place], and was told to have a CRAO. She was recommended to have further work up for stroke. Her PCP has ordered MRI and MRA, carotid doppler and echo with stroke lab. She was sent here for further evaluation. For these days, she stated that that her right high blurry vision still continues, with intermittent right orbit pain. Denies any speech difficulty, weakness, numbness, loss of consciousness, or headache. She had history of diabetes since 1997, she is on insulin with Lantus and night and pre-meal insulin at dinner. She had bilateral diabetic retinopathy, left eye worse than right eye, at baseline she has poor vision on the left eye, but relatively good on the right side. She had later surgery both eye in 2012 for retinopathy. She had a hyperlipidemia, on Crestor. She denies hypertension, her blood pressure 142/80 today. Family history, father has carotid disease decreased status post bypass surgery, mom had stroke, heart attack. Siblings have diabetes and hypertension. She has quit smoking and alcohol in 1997, use of cocaine a couple times in the past, denies any other illicit drug use.  Follow up 11/19/13 -  she has been doing the  same. She stated that her right eye vision has not improved or worsened. She followed up in [facility] [facility] and got left eye injection for DM retinopathy. She was also seen a neurologist in [place] for second opinion and was told no need to follow with neurology any more. She has not had MRI done yet due to anxiety and claustrophobia. She had carotid doppler which is unremarkable. Still waiting for 2D echo. She stated that her sugar level at 200s and she saw her PCP 4 days ago and increased on lantus. BP good today in clinic 125/77.  Interval history: During the interval time, pt has been doing the same. Her vision changes are stable and no change since last visit. She had 2D echo with Dr. Einar Gip and was told normal although I do not have her record. She is also going to have TEE with Dr. Einar Gip on 12/31/13. She will follow up with her PCP next month. She has been followed up with her ophthalmologist in [place] and received laser treatment and injection but so far no change of visual acuity. Her DM is in better control and glucose at home today 99, she is on lantus with novolog as well as jentadueto. Her BP was 147/91 today in clinic. She was put on dural antiplatelet by Dr. Einar Gip during the interval time. We have tried to do open MRI for her, however, she still has severe claustrophobic even in open MRI, so it was aborted.   Past Medical History  Diagnosis Date  .  Diabetes   . High cholesterol    Past Surgical History  Procedure Laterality Date  . Cesarean section      Twins, Breech  . Tubal ligation Bilateral   . Eye surgery Bilateral 2012    LASER, CATARACT   Family History  Problem Relation Age of Onset  . Diabetes Father   . Hypertension Father   . Dementia Mother   . Hypertension Sister   . Diabetes Sister   . Diabetes Sister   . Heart disease Sister    Current Outpatient Prescriptions  Medication Sig Dispense Refill  . aspirin EC 81 MG tablet Take 81 mg by mouth daily.     .  clopidogrel (PLAVIX) 75 MG tablet Take by mouth.    . CRESTOR 20 MG tablet   1  . insulin glargine (LANTUS) 100 UNIT/ML injection Inject 100 Units into the skin at bedtime.    . JENTADUETO 2.05-998 MG TABS Take by mouth daily.     Marland Kitchen LANTUS SOLOSTAR 100 UNIT/ML Solostar Pen 28 Units.    Marland Kitchen losartan (COZAAR) 50 MG tablet Take by mouth.    Marland Kitchen NOVOLOG FLEXPEN 100 UNIT/ML FlexPen   5  . ALPRAZolam (XANAX) 0.5 MG tablet Take 1 tablet (0.5 mg total) by mouth as needed for anxiety. (Patient not taking: Reported on 12/19/2013) 3 tablet 0  . Loteprednol Etabonate (LOTEMAX) 0.5 % GEL Place into both eyes 2 (two) times daily.      No current facility-administered medications for this visit.   Allergies  Allergen Reactions  . Codeine Itching  . Hydrocodone Itching  . Oxycodone Itching  . Penicillin G Itching   History   Social History  . Marital Status: Divorced    Spouse Name: N/A    Number of Children: 3  . Years of Education: 12   Occupational History  .     Social History Main Topics  . Smoking status: Former Research scientist (life sciences)  . Smokeless tobacco: Never Used     Comment: QUIT IN 67  . Alcohol Use: No     Comment: QUIT IN 1987  . Drug Use: No     Comment: QUIT 1980  . Sexual Activity: Not on file   Other Topics Concern  . Not on file   Social History Narrative   Patient is married with 3 children.   Patient is right handed.   Patient has 12 th grade education.   Patient has not been drinking caffeine.    Review of Systems Full 14 system review of systems performed and notable only for those listed, all others are neg:  Constitutional: fatigue Cardiovascular: chest pain Ear/Nose/Throat: N/A  Skin: N/A  Eyes: double vision, loss of vision, eye pain   Respiratory: cough, runny nose   Gastroitestinal: N/A  Hematology/Lymphatic: N/A  Endocrine: N/A  Musculoskeletal: N/A  Allergy/Immunology:  skin sensitivity  Neurological: HA Psychiatric: not enough sleep, sleepiness     Physical Exam  Filed Vitals:   12/19/13 0819  BP: 147/91  Pulse: 78    General - Well nourished, well developed, in no apparent distress.  Ophthalmologic - Sharp disc margin bilaterally. right side disc pale, there are several small branches of retinal artery were grown into disc, chronic retinal hemorrhage. Left side disc pale, retinal artery and vein were seen but some branches are still missing.   Cardiovascular - Regular rate and rhythm with no murmur. Carotid pulses were 2+ without bruits .   Neck - supple, no nuchal rigidity .  Mental Status -  Level of arousal and orientation to time, place, and person were intact. Language including expression, naming, repetition, comprehension was assessed and found intact.  Cranial Nerves II - XII - II - Visual field intact OU, right visual acuity finger counting, left 20/400. III, IV, VI - Extraocular movements intact. V - Facial sensation intact bilaterally. VII - Facial movement intact bilaterally. VIII - Hearing & vestibular intact bilaterally. X - Palate elevates symmetrically. XI - Chin turning & shoulder shrug intact bilaterally. XII - Tongue protrusion intact.  Motor Strength - The patient's strength was normal in all extremities and pronator drift was absent.  Bulk was normal and fasciculations were absent.   Motor Tone - Muscle tone was assessed at the neck and appendages and was normal.  Reflexes - The patient's reflexes were normal in all extremities and she had no pathological reflexes.  Sensory - Light touch, temperature/pinprick were assessed and were normal.    Coordination - The patient had normal movements in the hands and feet with no ataxia or dysmetria.  Tremor was absent.  Gait and Station - slow cautious gait due to decreased vision.   Imaging CUS - 1. Very minimal amount of right-sided atherosclerotic plaque, not resulting in a hemodynamically significant stenosis. 2. Normal sonographic evaluation  of the left carotid system.  Lab Review CMP normal, CBC normal, ESR 5, LDL 86, A1C 12.1, CRP 1.8 (10/29/13)    Assessment and Plan:   In summary, Adrienne [name] is a 58 y.o. female with PMH of diabetes, hyperlipidemia presents  acute onset right decreased vision in 10/2013. 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 Fungal examination today also showed sharp disc bilaterally but improved retinal artery branches overtime. However, her visual acuity did not improve much. CUS normal ruled out carotid dissection, ESR ruled out GCA. MRI not able to tolerate, will hold off as it will not change management at this time. Cardiology will do TEE but TTE was told to be negative. She is on dural antiplatelet now. Her A1c was 12.1, diabetes not in good control, currently on lantus and novolog, glucose better than before.   - Continue aspirin and plavix and Crestor for stroke prevention - will contact Dr. Einar Gip to see how long dural antiplatelet we need. - Aggressive diabetic control with insulin, follow up with PCP closely. Check glucose at home. - continue BP meds for BP control. Check BP at home. - Follow up with your primary care physician for stroke risk factor modification. Recommend maintain blood pressure goal <130/80, diabetes with hemoglobin A1c goal below 6.5% and lipids with LDL cholesterol goal below 70 mg/dL.  - will follow up TEE by Dr. Einar Gip - may consider OT after all work up done. - follow up with ophthalmologist for vision loss - RTC in 2 months.  No orders of the defined types were placed in this encounter.    Patient Instructions  - continue ASA and plavix and crestor for stroke prevention - follow up with Dr. Einar Gip for TEE and please let Dr. Einar Gip fax over the result to [place] - will contact Dr. Einar Gip to see how long you need to be on both ASA and plavix - Follow up  with your primary care physician for stroke risk factor modification. Recommend maintain blood pressure goal <130/80, diabetes with hemoglobin A1c goal below 6.5% and lipids with LDL cholesterol goal below 70 mg/dL.  - continue to  follow up with ophthalmology in [place] - check glucose at home - ask Dr. Karren Burly for a prescription for home BP monitoring device and check for BP at home. - follow up in 2 months.  Rosalin Hawking, MD PhD [facility] [address] [place], [place] [address] [phone]

## 2013-12-31 ENCOUNTER — Encounter (HOSPITAL_COMMUNITY)
Admission: RE | Disposition: A | Discharge status: Home / Self Care | Payer: Self-pay | Source: Ambulatory Visit | Attending: Cardiology

## 2013-12-31 ENCOUNTER — Ambulatory Visit (HOSPITAL_COMMUNITY)
Admission: RE | Admit: 2013-12-31 | Discharge: 2013-12-31 | Disposition: A | Discharge status: Home / Self Care | Payer: 59 | Source: Ambulatory Visit | Attending: Cardiology | Admitting: Cardiology

## 2013-12-31 ENCOUNTER — Encounter (HOSPITAL_COMMUNITY): Payer: Self-pay

## 2013-12-31 DIAGNOSIS — Q211 Atrial septal defect: Secondary | ICD-10-CM | POA: Diagnosis present

## 2013-12-31 HISTORY — PX: TEE WITHOUT CARDIOVERSION: SHX5443

## 2013-12-31 LAB — GLUCOSE, CAPILLARY: Glucose-Capillary: 142 mg/dL — ABNORMAL HIGH (ref 70–99)

## 2013-12-31 SURGERY — ECHOCARDIOGRAM, TRANSESOPHAGEAL
Anesthesia: Moderate Sedation

## 2013-12-31 MED ORDER — BUTAMBEN-TETRACAINE-BENZOCAINE 2-2-14 % EX AERO
INHALATION_SPRAY | CUTANEOUS | Status: DC | PRN
  Administered 2013-12-31: 2 via TOPICAL

## 2013-12-31 MED ORDER — SODIUM CHLORIDE 0.9 % IV SOLN
INTRAVENOUS | Status: DC
  Administered 2013-12-31: 11:00:00 via INTRAVENOUS

## 2013-12-31 MED ORDER — MIDAZOLAM HCL 10 MG/2ML IJ SOLN
INTRAMUSCULAR | Status: DC | PRN
  Administered 2013-12-31: 1 mg via INTRAVENOUS
  Administered 2013-12-31: 2 mg via INTRAVENOUS

## 2013-12-31 MED ORDER — HYDRALAZINE HCL 20 MG/ML IJ SOLN
INTRAMUSCULAR | Status: DC | PRN
  Administered 2013-12-31: 10 mg via INTRAVENOUS

## 2013-12-31 MED ORDER — FENTANYL CITRATE 0.05 MG/ML IJ SOLN
INTRAMUSCULAR | Status: DC | PRN
  Administered 2013-12-31: 50 ug via INTRAVENOUS

## 2013-12-31 MED ORDER — HYDRALAZINE HCL 20 MG/ML IJ SOLN
INTRAMUSCULAR | Status: AC
  Filled 2013-12-31: qty 1

## 2013-12-31 MED ORDER — FENTANYL CITRATE 0.05 MG/ML IJ SOLN
INTRAMUSCULAR | Status: AC
  Filled 2013-12-31: qty 2

## 2013-12-31 MED ORDER — MIDAZOLAM HCL 5 MG/ML IJ SOLN
INTRAMUSCULAR | Status: AC
  Filled 2013-12-31: qty 2

## 2013-12-31 NOTE — Discharge Instructions (Addendum)
Conscious Sedation °Sedation is the use of medicines to promote relaxation and relieve discomfort and anxiety. Conscious sedation is a type of sedation. Under conscious sedation you are less alert than normal but are still able to respond to instructions or stimulation. Conscious sedation is used during short medical and dental procedures. It is milder than deep sedation or general anesthesia and allows you to return to your regular activities sooner.  °LET YOUR HEALTH CARE PROVIDER KNOW ABOUT:  °· Any allergies you have. °· All medicines you are taking, including vitamins, herbs, eye drops, creams, and over-the-counter medicines. °· Use of steroids (by mouth or creams). °· Previous problems you or members of your family have had with the use of anesthetics. °· Any blood disorders you have. °· Previous surgeries you have had. °· Medical conditions you have. °· Possibility of pregnancy, if this applies. °· Use of cigarettes, alcohol, or illegal drugs. °RISKS AND COMPLICATIONS °Generally, this is a safe procedure. However, as with any procedure, problems can occur. Possible problems include: °· Oversedation. °· Trouble breathing on your own. You may need to have a breathing tube until you are awake and breathing on your own. °· Allergic reaction to any of the medicines used for the procedure. °BEFORE THE PROCEDURE °· You may have blood tests done. These tests can help show how well your kidneys and liver are working. They can also show how well your blood clots. °· A physical exam will be done.   °· Only take medicines as directed by your health care provider. You may need to stop taking medicines (such as blood thinners, aspirin, or nonsteroidal anti-inflammatory drugs) before the procedure.   °· Do not eat or drink at least 6 hours before the procedure or as directed by your health care provider. °· Arrange for a responsible adult, family member, or friend to take you home after the procedure. He or she should stay  with you for at least 24 hours after the procedure, until the medicine has worn off. °PROCEDURE  °· An intravenous (IV) catheter will be inserted into one of your veins. Medicine will be able to flow directly into your body through this catheter. You may be given medicine through this tube to help prevent pain and help you relax. °· The medical or dental procedure will be done. °AFTER THE PROCEDURE °· You will stay in a recovery area until the medicine has worn off. Your blood pressure and pulse will be checked.   °·  Depending on the procedure you had, you may be allowed to go home when you can tolerate liquids and your pain is under control. °Document Released: 09/28/2000 Document Revised: 01/08/2013 Document Reviewed: 09/10/2012 °ExitCare® Patient Information ©2015 [employer]. This information is not intended to replace advice given to you by your health care provider. Make sure you discuss any questions you have with your health care provider. °Transesophageal Echocardiogram °Transesophageal echocardiography (TEE) is a special type of test that produces images of the heart by using sound waves (echocardiogram). This type of echocardiography can obtain better images of the heart than standard echocardiography. TEE is done by passing a flexible tube down the esophagus. The heart is located in front of the esophagus. Because the heart and esophagus are close to one another, your health care provider can take very clear, detailed pictures of the heart via ultrasound waves. °TEE may be done: °· If your health care provider needs more information based on standard echocardiography findings. °· If you had a stroke. This might have   happened because a clot formed in your heart. TEE can visualize different areas of the heart and check for clots. °· To check valve anatomy and function. °· To check for infection on the inside of your heart (endocarditis). °· To evaluate the dividing wall (septum) of the heart and  presence of a hole that did not close after birth (patent foramen ovale or atrial septal defect). °· To help diagnose a tear in the wall of the aorta (aortic dissection). °· During cardiac valve surgery. This allows the surgeon to assess the valve repair before closing the chest. °· During a variety of other cardiac procedures to guide positioning of catheters. °· Sometimes before a cardioversion, which is a shock to convert heart rhythm back to normal. °LET YOUR HEALTH CARE PROVIDER KNOW ABOUT:  °· Any allergies you have. °· All medicines you are taking, including vitamins, herbs, eye drops, creams, and over-the-counter medicines. °· Previous problems you or members of your family have had with the use of anesthetics. °· Any blood disorders you have. °· Previous surgeries you have had. °· Medical conditions you have. °· Swallowing difficulties. °· An esophageal obstruction. °RISKS AND COMPLICATIONS  °Generally, TEE is a safe procedure. However, as with any procedure, complications can occur. Possible complications include an esophageal tear (rupture). °BEFORE THE PROCEDURE  °· Do not eat or drink for 6 hours before the procedure or as directed by your health care provider. °· Arrange for someone to drive you home after the procedure. Do not drive yourself home. During the procedure, you will be given medicines that can continue to make you feel drowsy and can impair your reflexes. °· An IV access tube will be started in the arm. °PROCEDURE  °· A medicine to help you relax (sedative) will be given through the IV access tube. °· A medicine may be sprayed or gargled to numb the back of the throat. °· Your blood pressure, heart rate, and breathing (vital signs) will be monitored during the procedure. °· The TEE probe is a long, flexible tube. The tip of the probe is placed into the back of the mouth, and you will be asked to swallow. This helps to pass the tip of the probe into the esophagus. Once the tip of the probe  is in the correct area, your health care provider can take pictures of the heart. °· TEE is usually not a painful procedure. You may feel the probe press against the back of the throat. The probe does not enter the trachea and does not affect your breathing. °AFTER THE PROCEDURE  °· You will be in bed, resting, until you have fully returned to consciousness. °· When you first awaken, your throat may feel slightly sore and will probably still feel numb. This will improve slowly over time. °· You will not be allowed to eat or drink until it is clear that the numbness has improved. °· Once you have been able to drink, urinate, and sit on the edge of the bed without feeling sick to your stomach (nausea) or dizzy, you may be cleared to go home. °· You should have a friend or family member with you for the next 24 hours after your procedure. °Document Released: 03/26/2002 Document Revised: 01/08/2013 Document Reviewed: 07/05/2012 °ExitCare® Patient Information ©2015 [employer]. This information is not intended to replace advice given to you by your health care provider. Make sure you discuss any questions you have with your health care provider. ° °

## 2013-12-31 NOTE — H&P (Signed)
  Please see office visit notes for complete details of HPI.  

## 2013-12-31 NOTE — Progress Notes (Signed)
  Echocardiogram Echocardiogram Transesophageal has been performed.  Adrienne [name] 12/31/2013, 11:39 AM

## 2013-12-31 NOTE — CV Procedure (Signed)
Normal LVEF. Small fenestrated atrial septal defect with left to right shunting by color Doppler exam. The largest defect measured 0.5cm2 Contrast study was negative for right to left shunting. Otherwise normal TEE with trace MR, TR. Mild soft plaque in the thoracic aorta.

## 2013-12-31 NOTE — Interval H&P Note (Signed)
History and Physical Interval Note:  12/31/2013 11:06 AM  Adrienne [name]  has presented today for surgery, with the diagnosis of ASB  The various methods of treatment have been discussed with the patient and family. After consideration of risks, benefits and other options for treatment, the patient has consented to  Procedure(s) with comments: TRANSESOPHAGEAL ECHOCARDIOGRAM (TEE) (N/A) - H&P in file as a surgical intervention .  The patient's history has been reviewed, patient examined, no change in status, stable for surgery.  I have reviewed the patient's chart and labs.  Questions were answered to the patient's satisfaction.     Laverda Page

## 2014-01-01 ENCOUNTER — Encounter (HOSPITAL_COMMUNITY): Payer: Self-pay | Admitting: Cardiology

## 2014-01-06 ENCOUNTER — Ambulatory Visit (INDEPENDENT_AMBULATORY_CARE_PROVIDER_SITE_OTHER): Payer: 59 | Admitting: Ophthalmology

## 2014-01-08 ENCOUNTER — Ambulatory Visit: Payer: 59 | Admitting: Occupational Therapy

## 2014-01-13 ENCOUNTER — Encounter: Payer: Self-pay | Admitting: Neurology

## 2014-01-30 ENCOUNTER — Ambulatory Visit: Payer: 59 | Admitting: Occupational Therapy

## 2014-02-17 ENCOUNTER — Encounter: Payer: Self-pay | Admitting: Neurology

## 2014-02-25 ENCOUNTER — Ambulatory Visit: Payer: 59 | Admitting: Neurology

## 2014-10-13 ENCOUNTER — Telehealth: Payer: Self-pay

## 2014-10-13 NOTE — Telephone Encounter (Signed)
Received records request from [employer] , forwarded to [employer] for processing. ° °

## 2014-10-22 NOTE — Telephone Encounter (Signed)
[employer] calling from [employer] office to check status of records release.  Gave contact information for [employer]  To check status of request.

## 2015-09-05 ENCOUNTER — Inpatient Hospital Stay (HOSPITAL_COMMUNITY)
Admission: EM | Admit: 2015-09-05 | Discharge: 2015-09-08 | DRG: 639 | Disposition: A | Discharge status: Home Health | Payer: Medicaid Other | Attending: Internal Medicine | Admitting: Internal Medicine

## 2015-09-05 ENCOUNTER — Encounter (HOSPITAL_COMMUNITY): Payer: Self-pay

## 2015-09-05 DIAGNOSIS — W19XXXA Unspecified fall, initial encounter: Secondary | ICD-10-CM | POA: Diagnosis present

## 2015-09-05 DIAGNOSIS — E1110 Type 2 diabetes mellitus with ketoacidosis without coma: Secondary | ICD-10-CM | POA: Diagnosis present

## 2015-09-05 DIAGNOSIS — Z9119 Patient's noncompliance with other medical treatment and regimen: Secondary | ICD-10-CM

## 2015-09-05 DIAGNOSIS — Z7982 Long term (current) use of aspirin: Secondary | ICD-10-CM

## 2015-09-05 DIAGNOSIS — I480 Paroxysmal atrial fibrillation: Secondary | ICD-10-CM

## 2015-09-05 DIAGNOSIS — H409 Unspecified glaucoma: Secondary | ICD-10-CM | POA: Diagnosis present

## 2015-09-05 DIAGNOSIS — Z7902 Long term (current) use of antithrombotics/antiplatelets: Secondary | ICD-10-CM

## 2015-09-05 DIAGNOSIS — Z833 Family history of diabetes mellitus: Secondary | ICD-10-CM

## 2015-09-05 DIAGNOSIS — E875 Hyperkalemia: Secondary | ICD-10-CM | POA: Diagnosis present

## 2015-09-05 DIAGNOSIS — E11319 Type 2 diabetes mellitus with unspecified diabetic retinopathy without macular edema: Secondary | ICD-10-CM | POA: Diagnosis present

## 2015-09-05 DIAGNOSIS — E860 Dehydration: Secondary | ICD-10-CM | POA: Diagnosis present

## 2015-09-05 DIAGNOSIS — Z87891 Personal history of nicotine dependence: Secondary | ICD-10-CM

## 2015-09-05 DIAGNOSIS — E785 Hyperlipidemia, unspecified: Secondary | ICD-10-CM | POA: Diagnosis present

## 2015-09-05 DIAGNOSIS — I959 Hypotension, unspecified: Secondary | ICD-10-CM | POA: Diagnosis present

## 2015-09-05 DIAGNOSIS — Z8249 Family history of ischemic heart disease and other diseases of the circulatory system: Secondary | ICD-10-CM

## 2015-09-05 DIAGNOSIS — Z8673 Personal history of transient ischemic attack (TIA), and cerebral infarction without residual deficits: Secondary | ICD-10-CM

## 2015-09-05 DIAGNOSIS — I10 Essential (primary) hypertension: Secondary | ICD-10-CM | POA: Diagnosis present

## 2015-09-05 DIAGNOSIS — Z885 Allergy status to narcotic agent status: Secondary | ICD-10-CM

## 2015-09-05 DIAGNOSIS — E1310 Other specified diabetes mellitus with ketoacidosis without coma: Principal | ICD-10-CM | POA: Diagnosis present

## 2015-09-05 DIAGNOSIS — Z880 Allergy status to penicillin: Secondary | ICD-10-CM

## 2015-09-05 DIAGNOSIS — E119 Type 2 diabetes mellitus without complications: Secondary | ICD-10-CM | POA: Diagnosis present

## 2015-09-05 DIAGNOSIS — R296 Repeated falls: Secondary | ICD-10-CM | POA: Diagnosis present

## 2015-09-05 DIAGNOSIS — Z9114 Patient's other noncompliance with medication regimen: Secondary | ICD-10-CM

## 2015-09-05 DIAGNOSIS — E7849 Other hyperlipidemia: Secondary | ICD-10-CM | POA: Diagnosis present

## 2015-09-05 DIAGNOSIS — Z794 Long term (current) use of insulin: Secondary | ICD-10-CM

## 2015-09-05 HISTORY — DX: Essential (primary) hypertension: I10

## 2015-09-05 LAB — BASIC METABOLIC PANEL
ANION GAP: 17 — AB (ref 5–15)
BUN: 31 mg/dL — ABNORMAL HIGH (ref 6–20)
CHLORIDE: 92 mmol/L — AB (ref 101–111)
CO2: 16 mmol/L — AB (ref 22–32)
Calcium: 9.6 mg/dL (ref 8.9–10.3)
Creatinine, Ser: 1.7 mg/dL — ABNORMAL HIGH (ref 0.44–1.00)
GFR calc non Af Amer: 32 mL/min — ABNORMAL LOW (ref >60)
GFR, EST AFRICAN AMERICAN: 37 mL/min — AB (ref >60)
Glucose, Bld: 818 mg/dL (ref 65–99)
POTASSIUM: 5.7 mmol/L — AB (ref 3.5–5.1)
Sodium: 125 mmol/L — ABNORMAL LOW (ref 135–145)

## 2015-09-05 LAB — URINALYSIS, ROUTINE W REFLEX MICROSCOPIC
Bilirubin Urine: NEGATIVE
Ketones, ur: 15 mg/dL — AB
Leukocytes, UA: NEGATIVE
Nitrite: NEGATIVE
Protein, ur: NEGATIVE mg/dL
SPECIFIC GRAVITY, URINE: 1.03 (ref 1.005–1.030)
pH: 5 (ref 5.0–8.0)

## 2015-09-05 LAB — BLOOD GAS, VENOUS
ACID-BASE DEFICIT: 11.9 mmol/L — AB (ref 0.0–2.0)
Bicarbonate: 15 mEq/L — ABNORMAL LOW (ref 20.0–24.0)
DRAWN BY: 11249
FIO2: 0.21
O2 Saturation: 55.3 %
PCO2 VEN: 38.1 mmHg — AB (ref 45.0–50.0)
PH VEN: 7.219 — AB (ref 7.250–7.300)
PO2 VEN: 32.9 mmHg (ref 31.0–45.0)
Patient temperature: 98.6
TCO2: 13.9 mmol/L (ref 0–100)

## 2015-09-05 LAB — I-STAT CG4 LACTIC ACID, ED: LACTIC ACID, VENOUS: 2.23 mmol/L — AB (ref 0.5–1.9)

## 2015-09-05 LAB — I-STAT CHEM 8, ED
BUN: 36 mg/dL — ABNORMAL HIGH (ref 6–20)
CALCIUM ION: 1.31 mmol/L — AB (ref 1.12–1.23)
CHLORIDE: 94 mmol/L — AB (ref 101–111)
CREATININE: 1.4 mg/dL — AB (ref 0.44–1.00)
HCT: 43 % (ref 36.0–46.0)
Hemoglobin: 14.6 g/dL (ref 12.0–15.0)
Potassium: 6.2 mmol/L — ABNORMAL HIGH (ref 3.5–5.1)
SODIUM: 126 mmol/L — AB (ref 135–145)
TCO2: 19 mmol/L (ref 0–100)

## 2015-09-05 LAB — CBC
HCT: 39.4 % (ref 36.0–46.0)
HEMOGLOBIN: 13.3 g/dL (ref 12.0–15.0)
MCH: 31.1 pg (ref 26.0–34.0)
MCHC: 33.8 g/dL (ref 30.0–36.0)
MCV: 92.1 fL (ref 78.0–100.0)
Platelets: 423 10*3/uL — ABNORMAL HIGH (ref 150–400)
RBC: 4.28 MIL/uL (ref 3.87–5.11)
RDW: 12.1 % (ref 11.5–15.5)
WBC: 7.7 10*3/uL (ref 4.0–10.5)

## 2015-09-05 LAB — CBG MONITORING, ED: Glucose-Capillary: >600 mg/dL (ref 65–99)

## 2015-09-05 LAB — URINE MICROSCOPIC-ADD ON
BACTERIA UA: NONE SEEN
WBC UA: NONE SEEN WBC/hpf (ref 0–5)

## 2015-09-05 MED ORDER — SODIUM CHLORIDE 0.9 % IV SOLN
INTRAVENOUS | Status: DC
  Administered 2015-09-05: 5.4 [IU]/h via INTRAVENOUS
  Filled 2015-09-05: qty 2.5

## 2015-09-05 MED ORDER — SODIUM CHLORIDE 0.9 % IV BOLUS (SEPSIS)
1000.0000 mL | Freq: Once | INTRAVENOUS | Status: AC
  Administered 2015-09-06: 1000 mL via INTRAVENOUS

## 2015-09-05 MED ORDER — DEXTROSE-NACL 5-0.45 % IV SOLN: INTRAVENOUS | Status: DC

## 2015-09-05 MED ORDER — SODIUM CHLORIDE 0.9 % IV BOLUS (SEPSIS)
1000.0000 mL | Freq: Once | INTRAVENOUS | Status: AC
  Administered 2015-09-05: 1000 mL via INTRAVENOUS

## 2015-09-05 MED ORDER — SODIUM CHLORIDE 0.9 % IV SOLN
INTRAVENOUS | Status: DC
  Administered 2015-09-06: 03:00:00 via INTRAVENOUS

## 2015-09-05 NOTE — ED Notes (Signed)
Critical glucose 818

## 2015-09-05 NOTE — H&P (Signed)
Adrienne [name] DOB: [birth year] DOA: 09/05/2015     PCP: Maggie Font, MD   Outpatient Specialists: Cardiology  Patient coming from:    home Lives alone,        Chief Complaint: Lightheadedness and frequent urination  HPI: Adrienne [name] is a 60 y.o. female with medical history significant of DM2, A. fib, diabetic retinopathy followed by [facility], glaucoma, hypertension      Presented with severe hyperglycemia patient reports feeling lightheaded and increasing urinary frequency for at least 2 weeks she's been nauseous but not vomiting have been feeling increased thirst this and this has became severe today and she presented to emergency me she haven't checked her blood sugar but that has been taking her medications except that she ran out of her insulin and haven't had a chance to get her refill  secondary to trouble of insurance reports has been out of insulin for the past 3 weeks. She reports have been feeling frequent falls, no head injury. Unsure if had LOC 2 weeks ago. She has been trying to drink Pepsi to help her thirst with no results.   Forced no chest pain or shortness of breath. 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 No Hx of CAD,    IN ER:  Temp (24hrs) Max:97.7 F (36.5 C)      And arrival to ER blood sugar 818 VBG 7.219 30.1 bicarbonate 15 Sodium 126 creatinine 1.7 Lactic acid 2.2 AG17 WBC 7.7 Following Medications were ordered in ER: Medications  insulin regular (NOVOLIN R,HUMULIN R) 250 Units in sodium chloride 0.9 % 250 mL (1 Units/mL) infusion (not administered)  sodium chloride 0.9 % bolus 1,000 mL (not administered)    And  sodium chloride 0.9 % bolus 1,000 mL (not administered)    And  0.9 %  sodium chloride infusion (not administered)  dextrose 5 %-0.45 % sodium chloride infusion (not  administered)     Hospitalist was called for admission for DKA secondary to medical noncompliance  Review of Systems:    Pertinent positives include: Frequency of urination  fatigue, weight loss   Constitutional:  No weight loss, night sweats, Fevers, chills, HEENT:  No headaches, Difficulty swallowing,Tooth/dental problems,Sore throat,  No sneezing, itching, ear ache, nasal congestion, post nasal drip,  Cardio-vascular:  No chest pain, Orthopnea, PND, anasarca, dizziness, palpitations.no Bilateral lower extremity swelling  GI:  No heartburn, indigestion, abdominal pain, nausea, vomiting, diarrhea, change in bowel habits, loss of appetite, melena, blood in stool, hematemesis Resp:  no shortness of breath at rest. No dyspnea on exertion, No excess mucus, no productive cough, No non-productive cough, No coughing up of blood.No change in color of mucus.No wheezing. Skin:  no rash or lesions. No jaundice GU:  no dysuria, change in color of urine, no urgency or frequency. No straining to urinate.  No flank pain.  Musculoskeletal:  No joint pain or no joint swelling. No decreased range of motion. No back pain.  Psych:  No change in mood or affect. No depression or anxiety. No memory loss.  Neuro: no localizing neurological complaints, no tingling, no weakness, no double vision, no gait abnormality, no slurred speech, no confusion  As per HPI otherwise 10 point review of systems negative.   Past Medical History: Past Medical History:  Diagnosis Date  . Diabetes ([place])   .  High cholesterol   . Hypertension    Past Surgical History:  Procedure Laterality Date  . CATARACT EXTRACTION, BILATERAL     Laser eye surgery, cataract surgery bi-lat  . CESAREAN SECTION     [facility]ins, [place]  . EYE SURGERY Bilateral 2012   LASER, CATARACT  . TEE WITHOUT CARDIOVERSION N/A 12/31/2013   Procedure: TRANSESOPHAGEAL ECHOCARDIOGRAM (TEE);  Surgeon: Laverda Page, MD;  Location: [place];   Service: Cardiovascular;  Laterality: N/A;  H&P in file  . TUBAL LIGATION Bilateral      Social History:  Ambulatory   independently or cane     reports that she has quit smoking. She has never used smokeless tobacco. She reports that she does not drink alcohol or use drugs.  Allergies:   Allergies  Allergen Reactions  . Codeine Itching  . Hydrocodone Itching  . Niacin And Related Itching  . Oxycodone Itching  . Penicillin G Itching     Family History:   Family History  Problem Relation Age of Onset  . Dementia Mother   . Diabetes Father   . Hypertension Father   . Hypertension Sister   . Diabetes Sister   . Diabetes Sister   . Heart disease Sister     Medications: Prior to Admission medications   Medication Sig Start Date End Date Taking? Authorizing Provider  aspirin EC 81 MG tablet Take 81 mg by mouth daily.    Yes Historical Provider, MD  brimonidine (ALPHAGAN) 0.15 % ophthalmic solution Place 1 drop into both eyes 2 (two) times daily.  05/20/15  Yes Historical Provider, MD  carvedilol (COREG) 12.5 MG tablet Take 12.5 mg by mouth 2 (two) times daily. 07/06/15  Yes Historical Provider, MD  clopidogrel (PLAVIX) 75 MG tablet Take 75 mg by mouth daily.  11/06/13  Yes Historical Provider, MD  dorzolamide-timolol (COSOPT) 22.3-6.8 MG/ML ophthalmic solution Place 1 drop into both eyes 2 (two) times daily.  08/05/15  Yes Historical Provider, MD  metFORMIN (GLUCOPHAGE) 1000 MG tablet Take 1,000 mg by mouth 2 (two) times daily. 08/05/15  Yes Historical Provider, MD  spironolactone (ALDACTONE) 50 MG tablet Take 50 mg by mouth every morning. 08/15/15  Yes Historical Provider, MD  valsartan (DIOVAN) 320 MG tablet Take 320 mg by mouth daily. 06/30/15  Yes Historical Provider, MD  ALPRAZolam Duanne Moron) 0.5 MG tablet Take 1 tablet (0.5 mg total) by mouth as needed for anxiety. Patient not taking: Reported on 12/19/2013 11/19/13   Rosalin Hawking, MD  insulin glargine (LANTUS) 100 UNIT/ML  injection Inject 30 Units into the skin at bedtime.     Historical Provider, MD  insulin lispro (HUMALOG KWIKPEN) 100 UNIT/ML KiwkPen Inject 18-20 Units into the skin every evening.  11/02/14   Historical Provider, MD    Physical Exam: Patient Vitals for the past 24 hrs:  BP Temp Temp src Pulse Resp SpO2 Height Weight  09/05/15 2141 122/77 - - 83 16 98 % - -  09/05/15 2044 - - - - - - 5\' 6"  (1.676 m) 81.6 kg (180 lb)  09/05/15 1943 126/73 97.7 F (36.5 C) Oral 99 18 99 % - -    1. General:  in No Acute distress 2. Psychological: Alert and  Oriented 3. Head/ENT:     Dry Mucous Membranes                          Head Non traumatic, neck supple  Poor Dentition 4. SKIN:   decreased Skin turgor,  Skin clean Dry and intact no rash 5. Heart: Regular rate and rhythm no  Murmur, Rub or gallop 6. Lungs: Clear to auscultation bilaterally, no wheezes or crackles   7. Abdomen: Soft,  non-tender, Non distended 8. Lower extremities: no clubbing, cyanosis, or edema 9. Neurologically Grossly intact, moving all 4 extremities equally  10. MSK: Normal range of motion   body mass index is 29.05 kg/m.  Labs on Admission:   Labs on Admission: I have personally reviewed following labs and imaging studies  CBC:  Recent Labs Lab 09/05/15 2059 09/05/15 2259  WBC 7.7  --   HGB 13.3 14.6  HCT 39.4 43.0  MCV 92.1  --   PLT 423*  --    Basic Metabolic Panel:  Recent Labs Lab 09/05/15 2059 09/05/15 2259 09/06/15 0033  NA 125* 126* 127*  K 5.7* 6.2* 6.9*  CL 92* 94* 100*  CO2 16*  --   --   GLUCOSE 818* >700* 665*  BUN 31* 36* 44*  CREATININE 1.70* 1.40* 1.30*  CALCIUM 9.6  --   --    GFR: Estimated Creatinine Clearance: 46 mL/min (by C-G formula based on SCr of 1.4 mg/dL). Liver Function Tests: No results for input(s): AST, ALT, ALKPHOS, BILITOT, PROT, ALBUMIN in the last 168 hours. No results for input(s): LIPASE, AMYLASE in the last 168 hours. No results  for input(s): AMMONIA in the last 168 hours. Coagulation Profile: No results for input(s): INR, PROTIME in the last 168 hours. Cardiac Enzymes: No results for input(s): CKTOTAL, CKMB, CKMBINDEX, TROPONINI in the last 168 hours. BNP (last 3 results) No results for input(s): PROBNP in the last 8760 hours. HbA1C: No results for input(s): HGBA1C in the last 72 hours. CBG:  Recent Labs Lab 09/05/15 1942 09/05/15 2245  GLUCAP >600* >600*   Lipid Profile: No results for input(s): CHOL, HDL, LDLCALC, TRIG, CHOLHDL, LDLDIRECT in the last 72 hours. Thyroid Function Tests: No results for input(s): TSH, T4TOTAL, FREET4, T3FREE, THYROIDAB in the last 72 hours. Anemia Panel: No results for input(s): VITAMINB12, FOLATE, FERRITIN, TIBC, IRON, RETICCTPCT in the last 72 hours. Urine analysis:    Component Value Date/Time   COLORURINE YELLOW 09/05/2015 2047   APPEARANCEUR CLEAR 09/05/2015 2047   LABSPEC 1.030 09/05/2015 2047   PHURINE 5.0 09/05/2015 2047   GLUCOSEU >1000 (A) 09/05/2015 2047   HGBUR MODERATE (A) 09/05/2015 2047   BILIRUBINUR NEGATIVE 09/05/2015 2047   KETONESUR 15 (A) 09/05/2015 2047   PROTEINUR NEGATIVE 09/05/2015 2047   NITRITE NEGATIVE 09/05/2015 2047   LEUKOCYTESUR NEGATIVE 09/05/2015 2047   Sepsis Labs: @LABRCNTIP (procalcitonin:4,lacticidven:4) )No results found for this or any previous visit (from the past 240 hour(s)).    UA   no evidence of UTI    No results found for: HGBA1C  Estimated Creatinine Clearance: 46 mL/min (by C-G formula based on SCr of 1.4 mg/dL).  BNP (last 3 results) No results for input(s): PROBNP in the last 8760 hours.   ECG REPORT  Independently reviewed Rate: 74  Rhythm: SR ST&T Change: No acute ischemic changes  QTC 445  Filed Weights   09/05/15 2044  Weight: 81.6 kg (180 lb)     Cultures: No results found for: SDES, SPECREQUEST, CULT, REPTSTATUS   Radiological Exams on Admission: No results found.  Chart has been  reviewed   Assessment/Plan  60 y.o. female with medical history significant of DM2, A. fib, diabetic retinopathy followed by [facility], glaucoma,  hypertension  Here with DKA secondary to medical noncompliance   Present on Admission:  . DKA, type 2, not at goal [facility]) will admit per DKA protocol, obtain serial BMET, start on glucosestabalizer, aggressive IVF.   Will work up cause of DKA was likely noncompliance patient has been off of insulin for the past 3 weeks Monitor in [place]. Replace potassium as needed.  Diabetic coordinator consult, can manage a consult for help with medication compliance . HLD (hyperlipidemia)   check lipid panel in the morning . Hyperkalemia - potassium elevated on i-STAT labs only will obtain BMET nursing staff is concerned that it has been hemolyzed . Falls -most likely secondary to dehydration but will order PT OT evaluation . Hypertension - Hold ARB given worsening renal function in the setting of dehydration and DKA History of atrial fibrillation currently in sinus rhythm patient on aspirin and Plavix has had recent falls will hold off on father anticoagulation patient will need to follow up with cardiology Dehydration secondary to DKA will rehydrate Other plan as per orders.  DVT prophylaxis:    Lovenox     Code Status:  FULL CODE as per patient    Family Communication:   Family not  at  Bedside    Disposition Plan:    To home once workup is complete and patient is stable                        Would benefit from PT/OT eval prior to DC due to repeated falls  ordered                     Case mangemnt    Diabetes coordinator       consulted                          Consults called: none   Admission status:    inpatient      Level of care       SDU      I have spent a total of 55 min on this admission    Adrienne [name] 09/05/2015, 1:04 AM    [facility]  Pager [phone]   after 2 AM please page floor coverage PA If 7AM-7PM, please  contact the day team taking care of the patient  [email or web address]  Password [email or web address]

## 2015-09-05 NOTE — ED Provider Notes (Addendum)
[facility] DEPT Provider Note   CSN: [id number] Arrival date & time: 09/05/15  1905     History   Chief Complaint Chief Complaint  Patient presents with  . Hyperglycemia    HPI Adrienne [name] is a 60 y.o. female.  HPI Pt comes in with cc of elevated blood sugar. Pt has IDDM type 2, stroke, HTN, HL hx. Reports that she lost her insurance and ran out of insulin 3 weeks ago. Since then she has had elevated blood sugars, but now she is having weakness, and falls and anorexia - so she decided to come to the ER. Pt denies nausea, emesis, fevers, chills, chest pains, shortness of breath, headaches, abdominal pain, uti like symptoms.    Past Medical History:  Diagnosis Date  . Diabetes ([place])   . High cholesterol   . Hypertension     Patient Active Problem List   Diagnosis Date Noted  . Fall 09/06/2015  . DKA (diabetic ketoacidoses) ([place]) 09/06/2015  . Hyperkalemia 09/05/2015  . Hypertension 12/20/2013  . DKA, type 2, not at goal [facility]) 11/19/2013  . HLD (hyperlipidemia) 11/19/2013  . 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 CATARACT EXTRACTION, BILATERAL     Laser eye surgery, cataract surgery bi-lat  . CESAREAN SECTION     Twins, [place]  . EYE SURGERY Bilateral 2012   LASER, CATARACT  . TEE WITHOUT CARDIOVERSION N/A 12/31/2013   Procedure: TRANSESOPHAGEAL ECHOCARDIOGRAM (TEE);  Surgeon: Laverda Page, MD;  Location: [place];  Service: Cardiovascular;  Laterality: N/A;  H&P in file  . TUBAL LIGATION Bilateral     OB History    No data available       Home Medications    Prior to Admission medications   Medication Sig Start Date End Date Taking? Authorizing Provider  aspirin EC 81 MG tablet Take 81 mg by mouth daily.    Yes Historical Provider, MD  brimonidine (ALPHAGAN) 0.15 % ophthalmic solution Place 1 drop into both eyes 2 (two) times daily.  05/20/15  Yes Historical Provider,  MD  carvedilol (COREG) 12.5 MG tablet Take 12.5 mg by mouth 2 (two) times daily. 07/06/15  Yes Historical Provider, MD  clopidogrel (PLAVIX) 75 MG tablet Take 75 mg by mouth daily.  11/06/13  Yes Historical Provider, MD  dorzolamide-timolol (COSOPT) 22.3-6.8 MG/ML ophthalmic solution Place 1 drop into both eyes 2 (two) times daily.  08/05/15  Yes Historical Provider, MD  metFORMIN (GLUCOPHAGE) 1000 MG tablet Take 1,000 mg by mouth 2 (two) times daily. 08/05/15  Yes Historical Provider, MD  spironolactone (ALDACTONE) 50 MG tablet Take 50 mg by mouth every morning. 08/15/15  Yes Historical Provider, MD  valsartan (DIOVAN) 320 MG tablet Take 320 mg by mouth daily. 06/30/15  Yes Historical Provider, MD  ALPRAZolam Duanne Moron) 0.5 MG tablet Take 1 tablet (0.5 mg total) by mouth as needed for anxiety. Patient not taking: Reported on 12/19/2013 11/19/13   Rosalin Hawking, MD  insulin glargine (LANTUS) 100 UNIT/ML injection Inject 30 Units into the skin at bedtime.     Historical Provider, MD  insulin lispro (HUMALOG KWIKPEN) 100 UNIT/ML KiwkPen Inject 18-20 Units into the skin every evening.  11/02/14   Historical Provider, MD    Family History Family History  Problem Relation Age of Onset  . Dementia Mother   . Diabetes Father   . Hypertension Father   . Hypertension Sister   . Diabetes Sister   .  Diabetes Sister   . Heart disease Sister     Social History Social History  Substance Use Topics  . Smoking status: Former Research scientist (life sciences)  . Smokeless tobacco: Never Used     Comment: QUIT IN 9  . Alcohol use No     Comment: QUIT IN 1987     Allergies   Codeine; Hydrocodone; Niacin and related; Oxycodone; and Penicillin g   Review of Systems Review of Systems  ROS 10 Systems reviewed and are negative for acute change except as noted in the HPI.    Physical Exam Updated Vital Signs BP 121/75 (BP Location: Right Arm)   Pulse 77   Temp 97.7 F (36.5 C) (Oral)   Resp 11   Ht 5\' 6"  (1.676 m)   Wt  180 lb (81.6 kg)   SpO2 100%   BMI 29.05 kg/m   Physical Exam  Constitutional: She is oriented to person, place, and time. She appears well-developed.  HENT:  Head: Normocephalic and atraumatic.  Eyes: Conjunctivae and EOM are normal. Pupils are equal, round, and reactive to light.  Neck: Normal range of motion. Neck supple.  Cardiovascular: Normal rate, regular rhythm and normal heart sounds.   Pulmonary/Chest: Effort normal and breath sounds normal. No respiratory distress.  Abdominal: Soft. Bowel sounds are normal. She exhibits no distension. There is no tenderness. There is no rebound and no guarding.  Neurological: She is alert and oriented to person, place, and time.  Skin: Skin is warm and dry.  Nursing note and vitals reviewed.    ED Treatments / Results  Labs (all labs ordered are listed, but only abnormal results are displayed) Labs Reviewed  BASIC METABOLIC PANEL - Abnormal; Notable for the following:       Result Value   Sodium 125 (*)    Potassium 5.7 (*)    Chloride 92 (*)    CO2 16 (*)    Glucose, Bld 818 (*)    BUN 31 (*)    Creatinine, Ser 1.70 (*)    GFR calc non Af Amer 32 (*)    GFR calc Af Amer 37 (*)    Anion gap 17 (*)    All other components within normal limits  CBC - Abnormal; Notable for the following:    Platelets 423 (*)    All other components within normal limits  URINALYSIS, ROUTINE W REFLEX MICROSCOPIC (NOT AT [facility]) - Abnormal; Notable for the following:    Glucose, UA >1000 (*)    Hgb urine dipstick MODERATE (*)    Ketones, ur 15 (*)    All other components within normal limits  URINE MICROSCOPIC-ADD ON - Abnormal; Notable for the following:    Squamous Epithelial / LPF 0-5 (*)    All other components within normal limits  BLOOD GAS, VENOUS - Abnormal; Notable for the following:    pH, Ven 7.219 (*)    pCO2, Ven 38.1 (*)    Bicarbonate 15.0 (*)    Acid-base deficit 11.9 (*)    All other components within normal limits  CBG  MONITORING, ED - Abnormal; Notable for the following:    Glucose-Capillary >600 (*)    All other components within normal limits  CBG MONITORING, ED - Abnormal; Notable for the following:    Glucose-Capillary >600 (*)    All other components within normal limits  I-STAT CHEM 8, ED - Abnormal; Notable for the following:    Sodium 126 (*)    Potassium 6.2 (*)  Chloride 94 (*)    BUN 36 (*)    Creatinine, Ser 1.40 (*)    Glucose, Bld >700 (*)    Calcium, Ion 1.31 (*)    All other components within normal limits  I-STAT CG4 LACTIC ACID, ED - Abnormal; Notable for the following:    Lactic Acid, Venous 2.23 (*)    All other components within normal limits  TROPONIN I  TROPONIN I  TROPONIN I  CBG MONITORING, ED  CBG MONITORING, ED  I-STAT CHEM 8, ED    EKG  EKG Interpretation  Date/Time:  Saturday September 05 2015 23:54:56 EDT Ventricular Rate:  74 PR Interval:    QRS Duration: 92 QT Interval:  401 QTC Calculation: 445 R Axis:   2 Text Interpretation:  Sinus rhythm Normal ECG No previous tracing Confirmed by KNOTT MD, DANIEL [id number]) on 09/06/2015 12:06:30 AM       Radiology No results found.  Procedures .Critical Care Performed by: Varney Biles Authorized by: Varney Biles   Critical care provider statement:    Critical care time (minutes):  40   Critical care time was exclusive of:  Separately billable procedures and treating other patients   Critical care was necessary to treat or prevent imminent or life-threatening deterioration of the following conditions:  Metabolic crisis   Critical care was time spent personally by me on the following activities:  Blood draw for specimens, development of treatment plan with patient or surrogate, discussions with consultants, examination of patient, obtaining history from patient or surrogate, ordering and performing treatments and interventions, ordering and review of laboratory studies, ordering and review of radiographic  studies, pulse oximetry and re-evaluation of patient's condition    (including critical care time)  Medications Ordered in ED Medications  insulin regular (NOVOLIN R,HUMULIN R) 250 Units in sodium chloride 0.9 % 250 mL (1 Units/mL) infusion (5.4 Units/hr Intravenous New Bag/Given 09/05/15 2330)  sodium chloride 0.9 % bolus 1,000 mL (not administered)    And  sodium chloride 0.9 % bolus 1,000 mL (1,000 mLs Intravenous New Bag/Given 09/05/15 2329)    And  0.9 %  sodium chloride infusion (not administered)  dextrose 5 %-0.45 % sodium chloride infusion (not administered)     Initial Impression / Assessment and Plan / ED Course  I have reviewed the triage vital signs and the nursing notes.  Pertinent labs & imaging results that were available during my care of the patient were reviewed by me and considered in my medical decision making (see chart for details).  Clinical Course    Pt in DKA, likely due to her non compliance. Will start hydration and insulin drip. Medicine to admit.  Final Clinical Impressions(s) / ED Diagnoses   Final diagnoses:  Diabetic ketoacidosis without coma associated with type 2 diabetes mellitus ([place])    New Prescriptions New Prescriptions   No medications on file     Varney Biles, MD 09/05/15 [place], MD 09/06/15 [id number]

## 2015-09-05 NOTE — ED Triage Notes (Signed)
Patient states that she began feeling lightheaded and urinary frequency, and nausea x2 weeks ago, denies vomiting..  Patient states that became worse today.  Patient states that does not check her blood sugar daily but, does take her medications.  Patient states that she is having trouble with her insurance and has not been able to f/u as she should be.  Patient denies pain at this time.

## 2015-09-06 DIAGNOSIS — E1110 Type 2 diabetes mellitus with ketoacidosis without coma: Secondary | ICD-10-CM | POA: Diagnosis present

## 2015-09-06 DIAGNOSIS — Z87891 Personal history of nicotine dependence: Secondary | ICD-10-CM | POA: Diagnosis not present

## 2015-09-06 DIAGNOSIS — Z7982 Long term (current) use of aspirin: Secondary | ICD-10-CM | POA: Diagnosis not present

## 2015-09-06 DIAGNOSIS — Z9114 Patient's other noncompliance with medication regimen: Secondary | ICD-10-CM | POA: Diagnosis not present

## 2015-09-06 DIAGNOSIS — Z880 Allergy status to penicillin: Secondary | ICD-10-CM | POA: Diagnosis not present

## 2015-09-06 DIAGNOSIS — R350 Frequency of micturition: Secondary | ICD-10-CM | POA: Diagnosis present

## 2015-09-06 DIAGNOSIS — Z8673 Personal history of transient ischemic attack (TIA), and cerebral infarction without residual deficits: Secondary | ICD-10-CM | POA: Diagnosis not present

## 2015-09-06 DIAGNOSIS — Z885 Allergy status to narcotic agent status: Secondary | ICD-10-CM | POA: Diagnosis not present

## 2015-09-06 DIAGNOSIS — E860 Dehydration: Secondary | ICD-10-CM | POA: Diagnosis present

## 2015-09-06 DIAGNOSIS — E1310 Other specified diabetes mellitus with ketoacidosis without coma: Principal | ICD-10-CM

## 2015-09-06 DIAGNOSIS — Z794 Long term (current) use of insulin: Secondary | ICD-10-CM | POA: Diagnosis not present

## 2015-09-06 DIAGNOSIS — H409 Unspecified glaucoma: Secondary | ICD-10-CM | POA: Diagnosis present

## 2015-09-06 DIAGNOSIS — Z8249 Family history of ischemic heart disease and other diseases of the circulatory system: Secondary | ICD-10-CM | POA: Diagnosis not present

## 2015-09-06 DIAGNOSIS — E875 Hyperkalemia: Secondary | ICD-10-CM | POA: Diagnosis present

## 2015-09-06 DIAGNOSIS — E11319 Type 2 diabetes mellitus with unspecified diabetic retinopathy without macular edema: Secondary | ICD-10-CM | POA: Diagnosis present

## 2015-09-06 DIAGNOSIS — Z7902 Long term (current) use of antithrombotics/antiplatelets: Secondary | ICD-10-CM | POA: Diagnosis not present

## 2015-09-06 DIAGNOSIS — Z9119 Patient's noncompliance with other medical treatment and regimen: Secondary | ICD-10-CM | POA: Diagnosis not present

## 2015-09-06 DIAGNOSIS — W19XXXA Unspecified fall, initial encounter: Secondary | ICD-10-CM | POA: Diagnosis present

## 2015-09-06 DIAGNOSIS — I480 Paroxysmal atrial fibrillation: Secondary | ICD-10-CM | POA: Diagnosis present

## 2015-09-06 DIAGNOSIS — I959 Hypotension, unspecified: Secondary | ICD-10-CM | POA: Diagnosis present

## 2015-09-06 DIAGNOSIS — I10 Essential (primary) hypertension: Secondary | ICD-10-CM | POA: Diagnosis present

## 2015-09-06 DIAGNOSIS — R296 Repeated falls: Secondary | ICD-10-CM | POA: Diagnosis present

## 2015-09-06 DIAGNOSIS — E785 Hyperlipidemia, unspecified: Secondary | ICD-10-CM | POA: Diagnosis present

## 2015-09-06 DIAGNOSIS — Z833 Family history of diabetes mellitus: Secondary | ICD-10-CM | POA: Diagnosis not present

## 2015-09-06 LAB — BASIC METABOLIC PANEL
ANION GAP: 13 (ref 5–15)
Anion gap: 5 (ref 5–15)
BUN: 29 mg/dL — ABNORMAL HIGH (ref 6–20)
BUN: 21 mg/dL — AB (ref 6–20)
CHLORIDE: 103 mmol/L (ref 101–111)
CHLORIDE: 113 mmol/L — AB (ref 101–111)
CO2: 16 mmol/L — AB (ref 22–32)
CO2: 21 mmol/L — ABNORMAL LOW (ref 22–32)
CREATININE: 0.85 mg/dL (ref 0.44–1.00)
Calcium: 9.1 mg/dL (ref 8.9–10.3)
Calcium: 8.5 mg/dL — ABNORMAL LOW (ref 8.9–10.3)
Creatinine, Ser: 1.45 mg/dL — ABNORMAL HIGH (ref 0.44–1.00)
GFR calc Af Amer: >60 mL/min (ref >60)
GFR calc non Af Amer: >60 mL/min (ref >60)
GFR, EST AFRICAN AMERICAN: 44 mL/min — AB (ref >60)
GFR, EST NON AFRICAN AMERICAN: 38 mL/min — AB (ref >60)
GLUCOSE: 525 mg/dL — AB (ref 65–99)
GLUCOSE: 162 mg/dL — AB (ref 65–99)
POTASSIUM: 4.4 mmol/L (ref 3.5–5.1)
Potassium: 3.4 mmol/L — ABNORMAL LOW (ref 3.5–5.1)
SODIUM: 139 mmol/L (ref 135–145)
Sodium: 132 mmol/L — ABNORMAL LOW (ref 135–145)

## 2015-09-06 LAB — I-STAT CHEM 8, ED
BUN: 44 mg/dL — AB (ref 6–20)
CHLORIDE: 100 mmol/L — AB (ref 101–111)
CREATININE: 1.3 mg/dL — AB (ref 0.44–1.00)
Calcium, Ion: 1.12 mmol/L (ref 1.12–1.23)
GLUCOSE: 665 mg/dL — AB (ref 65–99)
HCT: 45 % (ref 36.0–46.0)
HEMOGLOBIN: 15.3 g/dL — AB (ref 12.0–15.0)
POTASSIUM: 6.9 mmol/L — AB (ref 3.5–5.1)
Sodium: 127 mmol/L — ABNORMAL LOW (ref 135–145)
TCO2: 17 mmol/L (ref 0–100)

## 2015-09-06 LAB — GLUCOSE, CAPILLARY
GLUCOSE-CAPILLARY: 358 mg/dL — AB (ref 65–99)
GLUCOSE-CAPILLARY: 233 mg/dL — AB (ref 65–99)
GLUCOSE-CAPILLARY: 217 mg/dL — AB (ref 65–99)
GLUCOSE-CAPILLARY: 183 mg/dL — AB (ref 65–99)
GLUCOSE-CAPILLARY: 180 mg/dL — AB (ref 65–99)
GLUCOSE-CAPILLARY: 115 mg/dL — AB (ref 65–99)
GLUCOSE-CAPILLARY: 223 mg/dL — AB (ref 65–99)
Glucose-Capillary: 252 mg/dL — ABNORMAL HIGH (ref 65–99)
Glucose-Capillary: 136 mg/dL — ABNORMAL HIGH (ref 65–99)
Glucose-Capillary: 118 mg/dL — ABNORMAL HIGH (ref 65–99)
Glucose-Capillary: 132 mg/dL — ABNORMAL HIGH (ref 65–99)
Glucose-Capillary: 405 mg/dL — ABNORMAL HIGH (ref 65–99)

## 2015-09-06 LAB — CBG MONITORING, ED: Glucose-Capillary: 467 mg/dL — ABNORMAL HIGH (ref 65–99)

## 2015-09-06 LAB — TROPONIN I
Troponin I: <0.03 ng/mL (ref <0.03)
Troponin I: <0.03 ng/mL (ref <0.03)
Troponin I: <0.03 ng/mL (ref <0.03)

## 2015-09-06 MED ORDER — BRIMONIDINE TARTRATE 0.15 % OP SOLN
1.0000 [drp] | Freq: Two times a day (BID) | OPHTHALMIC | Status: DC
  Administered 2015-09-06 – 2015-09-08 (×5): 1 [drp] via OPHTHALMIC
  Filled 2015-09-06: qty 5

## 2015-09-06 MED ORDER — SODIUM CHLORIDE 0.9 % IV SOLN: INTRAVENOUS | Status: DC

## 2015-09-06 MED ORDER — SPIRONOLACTONE 25 MG PO TABS
50.0000 mg | ORAL_TABLET | Freq: Every morning | ORAL | Status: DC
  Administered 2015-09-06: 50 mg via ORAL
  Filled 2015-09-06: qty 2

## 2015-09-06 MED ORDER — SODIUM CHLORIDE 0.9 % IV SOLN
INTRAVENOUS | Status: DC
  Administered 2015-09-06 – 2015-09-08 (×3): via INTRAVENOUS

## 2015-09-06 MED ORDER — ENOXAPARIN SODIUM 40 MG/0.4ML ~~LOC~~ SOLN
40.0000 mg | SUBCUTANEOUS | Status: DC
  Administered 2015-09-06 – 2015-09-07 (×2): 40 mg via SUBCUTANEOUS
  Filled 2015-09-06: qty 0.4

## 2015-09-06 MED ORDER — CLOPIDOGREL BISULFATE 75 MG PO TABS
75.0000 mg | ORAL_TABLET | Freq: Every day | ORAL | Status: DC
  Administered 2015-09-06 – 2015-09-08 (×3): 75 mg via ORAL
  Filled 2015-09-06 (×3): qty 1

## 2015-09-06 MED ORDER — ENOXAPARIN SODIUM 30 MG/0.3ML ~~LOC~~ SOLN: 30.0000 mg | SUBCUTANEOUS | Status: DC

## 2015-09-06 MED ORDER — DEXTROSE-NACL 5-0.45 % IV SOLN: INTRAVENOUS | Status: DC

## 2015-09-06 MED ORDER — ACETAMINOPHEN 325 MG PO TABS: 650.0000 mg | ORAL_TABLET | Freq: Four times a day (QID) | ORAL | Status: DC | PRN

## 2015-09-06 MED ORDER — POTASSIUM CHLORIDE CRYS ER 20 MEQ PO TBCR
40.0000 meq | EXTENDED_RELEASE_TABLET | Freq: Once | ORAL | Status: AC
  Administered 2015-09-06: 40 meq via ORAL
  Filled 2015-09-06: qty 2

## 2015-09-06 MED ORDER — CHLORHEXIDINE GLUCONATE 0.12 % MT SOLN
15.0000 mL | Freq: Two times a day (BID) | OROMUCOSAL | Status: DC
  Administered 2015-09-06 (×2): 15 mL via OROMUCOSAL
  Filled 2015-09-06 (×2): qty 15

## 2015-09-06 MED ORDER — DEXTROSE-NACL 5-0.45 % IV SOLN
INTRAVENOUS | Status: DC
  Administered 2015-09-06: 07:00:00 via INTRAVENOUS

## 2015-09-06 MED ORDER — ASPIRIN EC 81 MG PO TBEC
81.0000 mg | DELAYED_RELEASE_TABLET | Freq: Every day | ORAL | Status: DC
  Administered 2015-09-06 – 2015-09-08 (×3): 81 mg via ORAL
  Filled 2015-09-06 (×3): qty 1

## 2015-09-06 MED ORDER — DORZOLAMIDE HCL-TIMOLOL MAL 2-0.5 % OP SOLN
1.0000 [drp] | Freq: Two times a day (BID) | OPHTHALMIC | Status: DC
  Administered 2015-09-06 – 2015-09-08 (×5): 1 [drp] via OPHTHALMIC
  Filled 2015-09-06: qty 10

## 2015-09-06 MED ORDER — CETYLPYRIDINIUM CHLORIDE 0.05 % MT LIQD
7.0000 mL | Freq: Two times a day (BID) | OROMUCOSAL | Status: DC
  Administered 2015-09-06 (×2): 7 mL via OROMUCOSAL

## 2015-09-06 MED ORDER — SODIUM CHLORIDE 0.9 % IV BOLUS (SEPSIS)
1000.0000 mL | INTRAVENOUS | Status: AC
  Administered 2015-09-06 (×2): 1000 mL via INTRAVENOUS

## 2015-09-06 MED ORDER — INSULIN ASPART 100 UNIT/ML ~~LOC~~ SOLN
0.0000 [IU] | Freq: Three times a day (TID) | SUBCUTANEOUS | Status: DC
  Administered 2015-09-06: 5 [IU] via SUBCUTANEOUS
  Administered 2015-09-07: 11 [IU] via SUBCUTANEOUS
  Administered 2015-09-07: 15 [IU] via SUBCUTANEOUS
  Administered 2015-09-07: 5 [IU] via SUBCUTANEOUS
  Administered 2015-09-08: 8 [IU] via SUBCUTANEOUS
  Administered 2015-09-08: 5 [IU] via SUBCUTANEOUS

## 2015-09-06 MED ORDER — SODIUM CHLORIDE 0.9 % IV SOLN
INTRAVENOUS | Status: DC
  Filled 2015-09-06: qty 2.5

## 2015-09-06 MED ORDER — INSULIN ASPART 100 UNIT/ML ~~LOC~~ SOLN
0.0000 [IU] | Freq: Every day | SUBCUTANEOUS | Status: DC
  Administered 2015-09-06: 5 [IU] via SUBCUTANEOUS
  Administered 2015-09-07: 3 [IU] via SUBCUTANEOUS

## 2015-09-06 MED ORDER — SODIUM POLYSTYRENE SULFONATE 15 GM/60ML PO SUSP: 15.0000 g | Freq: Once | ORAL | Status: DC

## 2015-09-06 MED ORDER — ALPRAZOLAM 0.5 MG PO TABS: 0.5000 mg | ORAL_TABLET | ORAL | Status: DC | PRN

## 2015-09-06 MED ORDER — INSULIN GLARGINE 100 UNIT/ML ~~LOC~~ SOLN
30.0000 [IU] | Freq: Every day | SUBCUTANEOUS | Status: DC
  Administered 2015-09-06: 30 [IU] via SUBCUTANEOUS
  Filled 2015-09-06 (×2): qty 0.3

## 2015-09-06 MED ORDER — CARVEDILOL 12.5 MG PO TABS
12.5000 mg | ORAL_TABLET | Freq: Two times a day (BID) | ORAL | Status: DC
  Administered 2015-09-06: 12.5 mg via ORAL
  Filled 2015-09-06: qty 1

## 2015-09-06 MED ORDER — SODIUM CHLORIDE 0.9 % IV SOLN: INTRAVENOUS | Status: AC

## 2015-09-06 NOTE — Progress Notes (Signed)
[facility] Progress Note  Patient: Adrienne [name] [id number]   PCP: Maggie Font, MD DOB: 1955/03/15   DOA: 09/05/2015   DOS: 09/06/2015   Date of Service: the patient was seen and examined on 09/06/2015  Subjective: Denies any acute complaint other than feeling fatigued and sleepy. No nausea no vomiting no abdominal pain. Nutrition: Tolerating oral diet  Brief hospital course: Pt. with PMH of DM, HTN; admitted on 09/05/2015, with complaint of fatigue, was found to have DKA. Currently further plan is continue IV hydration.  Assessment and Plan: 1. DKA Likely from noncompliance of medication. Still remained hypotensive. IV fluid bolus given. Anion gap is closed. Advance diet advance to home insulin regimen with sliding scale.  2. History of hypertension. Currently hypotensive and therefore will discontinue blood pressure medication.   Pain management: When necessary Tylenol Activity: Consulted physical therapy Bowel regimen: last BM prior to admission Diet: Carb modified diet DVT Prophylaxis: subcutaneous Heparin  Advance goals of care discussion: Full code  Family Communication: nofamily was present at bedside, at the time of interview.   Disposition:  Discharge to home. Expected discharge date: 09/07/2015 Consultants: none Procedures: none  Antibiotics: Anti-infectives    None        Intake/Output Summary (Last 24 hours) at 09/06/15 1823 Last data filed at 09/06/15 1700  Gross per 24 hour  Intake          2751.81 ml  Output             1000 ml  Net          1751.81 ml   Filed Weights   09/05/15 2044 09/06/15 0317  Weight: 81.6 kg (180 lb) 76.8 kg (169 lb 4.8 oz)    Objective: Physical Exam: Vitals:   09/06/15 0605 09/06/15 1355 09/06/15 1402 09/06/15 1629  BP: (!) 107/57 (!) 79/54 (!) 88/40 114/65  Pulse: 79 68    Resp: 16 18    Temp: 98 F (36.7 C)     TempSrc: Oral     SpO2: 100% 99%    Weight:      Height:        General: Alert,  Awake and Oriented to Time, Place and Person. Appear in mild distress Eyes: PERRL, Conjunctiva normal ENT: Oral Mucosa clear moist. Neck: no JVD, no Abnormal Mass Or lumps Cardiovascular: S1 and S2 Present, no Murmur, Respiratory: Bilateral Air entry equal and Decreased, Clear to Auscultation, no Crackles, no wheezes Abdomen: Bowel Sound present, Soft and no tenderness Skin: no redness, no Rash  Extremities: no Pedal edema, no calf tenderness Neurologic: Grossly no focal neuro deficit. Bilaterally Equal motor strength  Data Reviewed: CBC:  Recent Labs Lab 09/05/15 2059 09/05/15 2259 09/06/15 0033  WBC 7.7  --   --   HGB 13.3 14.6 15.3*  HCT 39.4 43.0 45.0  MCV 92.1  --   --   PLT 423*  --   --    Basic Metabolic Panel:  Recent Labs Lab 09/05/15 2059 09/05/15 2259 09/06/15 0033 09/06/15 0101 09/06/15 0940  NA 125* 126* 127* 132* 139  K 5.7* 6.2* 6.9* 4.4 3.4*  CL 92* 94* 100* 103 113*  CO2 16*  --   --  16* 21*  GLUCOSE 818* >700* 665* 525* 162*  BUN 31* 36* 44* 29* 21*  CREATININE 1.70* 1.40* 1.30* 1.45* 0.85  CALCIUM 9.6  --   --  9.1 8.5*    Liver Function Tests: No results for input(s): AST, ALT,  ALKPHOS, BILITOT, PROT, ALBUMIN in the last 168 hours. No results for input(s): LIPASE, AMYLASE in the last 168 hours. No results for input(s): AMMONIA in the last 168 hours. Coagulation Profile: No results for input(s): INR, PROTIME in the last 168 hours. Cardiac Enzymes:  Recent Labs Lab 09/05/15 2356 09/06/15 0700 09/06/15 1155  TROPONINI <0.03 <0.03 <0.03   BNP (last 3 results) No results for input(s): PROBNP in the last 8760 hours.  CBG:  Recent Labs Lab 09/06/15 1019 09/06/15 1130 09/06/15 1202 09/06/15 1321 09/06/15 1719  GLUCAP 136* 115* 118* 132* 223*    Studies: No results found.   Scheduled Meds: . antiseptic oral rinse  7 mL Mouth Rinse q12n4p  . aspirin EC  81 mg Oral Daily  . brimonidine  1 drop Both Eyes BID  . chlorhexidine   15 mL Mouth Rinse BID  . clopidogrel  75 mg Oral Daily  . dorzolamide-timolol  1 drop Both Eyes BID  . enoxaparin (LOVENOX) injection  40 mg Subcutaneous Q24H  . insulin aspart  0-15 Units Subcutaneous TID WC  . insulin aspart  0-5 Units Subcutaneous QHS  . insulin glargine  30 Units Subcutaneous Daily   Continuous Infusions: . sodium chloride 75 mL/hr at 09/06/15 1631   PRN Meds: acetaminophen, ALPRAZolam  Time spent: 30 minutes  Author: Berle Mull, MD [facility] Pager: [phone] 09/06/2015 6:23 PM  If 7PM-7AM, please contact night-coverage at [email or web address], password [facility]

## 2015-09-06 NOTE — Evaluation (Signed)
Physical Therapy Evaluation Patient Details Name: Adrienne [name] MRN: [id number] DOB: [birth year] Today's Date: 09/06/2015   History of Present Illness  Pt admit with hyperglycemia over 800, and recent falls (3 ) in past 2 weeks.   Clinical Impression  Pt with recent falls admit with hyperglycemia , however during our evaluation pt with reports of very groggy feeling today and with increased dizziness sitting EOB for 2 minutes with drop in BP. Pt with decreased mobility at this time , to continue to follow with PT to assure safety with mobility.    Follow Up Recommendations Home health PT (depending on progress here )    Equipment Recommendations  Other (comment) (unsure at this time)    Recommendations for Other Services       Precautions / Restrictions Precautions Precautions: Fall (pt reprots she has fallen about 3 times in past 2 weeks , no injuries )      Mobility  Bed Mobility Overal bed mobility: Modified Independent             General bed mobility comments: moved to EOB fairly well. However after sitting for about 2 minutes continued to get dizzy and it did not ease off. So patient laid back down and nurse took BP after about 2 minutes it was 79/54. Even still dizzy after a few minutes. Educated on safety of not walkign to bathroom at this time, to call for assist and may have to use the [facility], nursing will assess.   Transfers                    Ambulation/Gait Ambulation/Gait assistance:  (pt stated she has walked to bathroom since she has been here (however felt dizzy a bit then as well) )              Stairs            Wheelchair Mobility    Modified Rankin (Stroke Patients Only)       Balance                                             Pertinent Vitals/Pain Pain Assessment: No/denies pain    Home Living Family/patient expects to be discharged to:: Private residence Living Arrangements: Alone Available Help  at Discharge: Friend(s) Type of Home: [place]: Two level;Bed/bath upstairs (pt not very specific for questions ) Home Equipment: Cane - single point      Prior Function Level of Independence: Independent         Comments: occasssionally I use a cane, but normally indpendent. Not driving. (pt was not very detailed with how she mamages her meals and grocery, even aftter asked seeral ways)      Hand Dominance        Extremity/Trunk Assessment               Lower Extremity Assessment: Generalized weakness         Communication   Communication: No difficulties  Cognition Arousal/Alertness: Lethargic (Pt stated " i have been ery sleepy today, maybe becasue my blood sugars came down" groggy with conversation as well ) Behavior During Therapy: Flat affect Overall Cognitive Status: Within Functional Limits for tasks assessed  General Comments      Exercises        Assessment/Plan    PT Assessment Patient needs continued PT services  PT Diagnosis Generalized weakness   PT Problem List Decreased activity tolerance;Decreased knowledge of use of DME  PT Treatment Interventions Functional mobility training;Gait training;Therapeutic activities;Patient/family education   PT Goals (Current goals can be found in the Care Plan section) Acute Rehab PT Goals Patient Stated Goal: I want to feel better  PT Goal Formulation: With patient Time For Goal Achievement: 09/20/15 Potential to Achieve Goals: Good    Frequency Min 3X/week   Barriers to discharge        Co-evaluation               End of Session   Activity Tolerance: Patient limited by lethargy (limited due to low BPs) Patient left: in bed;with bed alarm set Nurse Communication: Mobility status    Functional Assessment Tool Used: clinical judgement Functional Limitation: Mobility: Walking and moving around Mobility: Walking and Moving Around Current  Status [id number]): At least 20 percent but less than 40 percent impaired, limited or restricted Mobility: Walking and Moving Around Goal Status [phone]): 0 percent impaired, limited or restricted    Time: 1330-1357 PT Time Calculation (min) (ACUTE ONLY): 27 min   Charges:   PT Evaluation $PT Eval Low Complexity: 1 Procedure     PT G Codes:   PT G-Codes **NOT FOR INPATIENT CLASS** Functional Assessment Tool Used: clinical judgement Functional Limitation: Mobility: Walking and moving around Mobility: Walking and Moving Around Current Status [id number]): At least 20 percent but less than 40 percent impaired, limited or restricted Mobility: Walking and Moving Around Goal Status [phone]): 0 percent impaired, limited or restricted    Adrienne [name] 09/06/2015, 2:16 PM  Clide Dales, PT Pager: [phone] 09/06/2015

## 2015-09-06 NOTE — Progress Notes (Signed)
BP +88/40 manually. pt feels dizzy. MD notified  Barbee Shropshire. Brigitte Pulse, RN

## 2015-09-07 ENCOUNTER — Telehealth: Payer: Self-pay

## 2015-09-07 LAB — LIPID PANEL
CHOLESTEROL: 266 mg/dL — AB (ref 0–200)
HDL: 25 mg/dL — ABNORMAL LOW (ref >40)
LDL CALC: 174 mg/dL — AB (ref 0–99)
Total CHOL/HDL Ratio: 10.6 RATIO
Triglycerides: 334 mg/dL — ABNORMAL HIGH (ref <150)
VLDL: 67 mg/dL — AB (ref 0–40)

## 2015-09-07 LAB — GLUCOSE, CAPILLARY
GLUCOSE-CAPILLARY: 268 mg/dL — AB (ref 65–99)
Glucose-Capillary: 345 mg/dL — ABNORMAL HIGH (ref 65–99)
Glucose-Capillary: 245 mg/dL — ABNORMAL HIGH (ref 65–99)

## 2015-09-07 LAB — CBC
HCT: 28.1 % — ABNORMAL LOW (ref 36.0–46.0)
Hemoglobin: 9.9 g/dL — ABNORMAL LOW (ref 12.0–15.0)
MCH: 31.8 pg (ref 26.0–34.0)
MCHC: 35.2 g/dL (ref 30.0–36.0)
MCV: 90.4 fL (ref 78.0–100.0)
Platelets: 310 10*3/uL (ref 150–400)
RBC: 3.11 MIL/uL — AB (ref 3.87–5.11)
RDW: 12.2 % (ref 11.5–15.5)
WBC: 5.7 10*3/uL (ref 4.0–10.5)

## 2015-09-07 LAB — BASIC METABOLIC PANEL
Anion gap: 5 (ref 5–15)
BUN: 12 mg/dL (ref 6–20)
CALCIUM: 8.1 mg/dL — AB (ref 8.9–10.3)
CO2: 18 mmol/L — AB (ref 22–32)
CREATININE: 0.85 mg/dL (ref 0.44–1.00)
Chloride: 111 mmol/L (ref 101–111)
GFR calc non Af Amer: >60 mL/min (ref >60)
Glucose, Bld: 381 mg/dL — ABNORMAL HIGH (ref 65–99)
Potassium: 4.3 mmol/L (ref 3.5–5.1)
SODIUM: 134 mmol/L — AB (ref 135–145)

## 2015-09-07 LAB — HEMOGLOBIN A1C
Hgb A1c MFr Bld: 15 % — ABNORMAL HIGH (ref 4.8–5.6)
Mean Plasma Glucose: 384 mg/dL

## 2015-09-07 MED ORDER — INSULIN ASPART PROT & ASPART (70-30 MIX) 100 UNIT/ML ~~LOC~~ SUSP
25.0000 [IU] | Freq: Two times a day (BID) | SUBCUTANEOUS | Status: DC
  Filled 2015-09-07 (×2): qty 10

## 2015-09-07 MED ORDER — CLOTRIMAZOLE 1 % VA CREA
1.0000 | TOPICAL_CREAM | Freq: Every day | VAGINAL | Status: DC
  Administered 2015-09-07: 1 via VAGINAL
  Filled 2015-09-07: qty 45

## 2015-09-07 MED ORDER — CLOTRIMAZOLE 2 % VA CREA
1.0000 | TOPICAL_CREAM | Freq: Every day | VAGINAL | Status: DC
  Filled 2015-09-07: qty 21

## 2015-09-07 MED ORDER — INSULIN GLARGINE 100 UNIT/ML ~~LOC~~ SOLN: 40.0000 [IU] | Freq: Every day | SUBCUTANEOUS | Status: DC

## 2015-09-07 MED ORDER — FLEET ENEMA 7-19 GM/118ML RE ENEM
1.0000 | ENEMA | Freq: Once | RECTAL | Status: AC
  Administered 2015-09-07: 1 via RECTAL
  Filled 2015-09-07: qty 1

## 2015-09-07 MED ORDER — INSULIN GLARGINE 100 UNIT/ML ~~LOC~~ SOLN
35.0000 [IU] | Freq: Every day | SUBCUTANEOUS | Status: DC
  Administered 2015-09-07: 35 [IU] via SUBCUTANEOUS
  Filled 2015-09-07: qty 0.35

## 2015-09-07 MED ORDER — INSULIN ASPART 100 UNIT/ML ~~LOC~~ SOLN
8.0000 [IU] | Freq: Three times a day (TID) | SUBCUTANEOUS | Status: DC
  Administered 2015-09-08 (×2): 8 [IU] via SUBCUTANEOUS

## 2015-09-07 NOTE — Telephone Encounter (Signed)
Message received from Dessa Phi, RN CM requesting a hospital follow up appointment for the patient at [facility].  An appointment was scheduled for 09/11/15 @ 1530 and the information was placed on the AVS.   Update provided to K. Mahabir, RN CM

## 2015-09-07 NOTE — Progress Notes (Signed)
Inpatient Diabetes Program Recommendations  AACE/ADA: New Consensus Statement on Inpatient Glycemic Control (2015)  Target Ranges:  Prepandial:   less than 140 mg/dL      Peak postprandial:   less than 180 mg/dL (1-2 hours)      Critically ill patients:  140 - 180 mg/dL   Lab Results  Component Value Date   GLUCAP 345 (H) 09/07/2015   HGBA1C 15.0 (H) 09/06/2015    Review of Glycemic Control  Diabetes history: DM2 Outpatient Diabetes medications: None on admission. Previously on Lantus 30 units, Humalog 18-20 units QPM, metformin 1000 mg bid  Current orders for Inpatient glycemic control: Lantus 40 units QD, Novolog moderate tidwc and hs  HgbA1C of 15% indicates poor glycemic control prior to admission. Pt states she does not have insurance at this time and will need affordable medicine. Will also need PCP to manage DM.  Inpatient Diabetes Program Recommendations:    Consider addition of Novolog 8 units tidwc for meal coverage insulin. Needs close f/u by PCP. Will need affordable insulin when discharged -  70/30 25 units bid and Novolin R for correction insulin.  Long discussion with pt regarding importance of getting HgbA1C down to prevent short and long term complications of DM. Pt states she can afford generic 70/30 and Regular insulin at [facility] for $24.88. Discussed glucose monitoring 3-4 times/day and taking logbook of blood sugars to MD for any needed adjustments.  Will continue to follow. Discussed with RN.  Thank you. Lorenda Peck, RD, LDN, CDE Inpatient Diabetes Coordinator [phone]

## 2015-09-07 NOTE — Progress Notes (Addendum)
Physical Therapy Treatment Patient Details Name: Adrienne [name] MRN: [id number] DOB: 30-Nov-1955 Today's Date: 09/07/2015    History of Present Illness Pt admit with hyperglycemia over 800, and recent falls (3 ) in past 2 weeks.     PT Comments    Progressing with mobility. Pt c/o mild lightheadedness/dizziness this session. She was unsteady at times especially without UE support. Will continue to follow and progress activity as tolerated. BP sitting: 133/70, standing:105/71, end of session: 110/72.  Follow Up Recommendations  Home health PT;Supervision for mobility/OOB     Equipment Recommendations   (pt states she has a cane for the blind that she sometimes uses)    Recommendations for Other Services OT consult     Precautions / Restrictions Precautions Precautions: Fall Precaution Comments: reports falls in the days leading up to hospitalization Restrictions Weight Bearing Restrictions: No    Mobility  Bed Mobility Overal bed mobility: Modified Independent                Transfers Overall transfer level: Needs assistance   Transfers: Sit to/from Stand Sit to Stand: Supervision         General transfer comment: supervised for safety, no c/o dizziness  Ambulation/Gait Ambulation/Gait assistance: Min assist Ambulation Distance (Feet): 120 Feet Assistive device:  (none vs IV pole) Gait Pattern/deviations: Step-through pattern;Decreased stride length     General Gait Details: intermittent assist needed to stabilize especially without 1 UE support of IV pole. Pt c/o mild lightheadedness/dizziness.    Stairs            Wheelchair Mobility    Modified Rankin (Stroke Patients Only)       Balance Overall balance assessment: Needs assistance;History of Falls           Standing balance-Leahy Scale: Fair                      Cognition Arousal/Alertness: Awake/alert Behavior During Therapy: WFL for tasks assessed/performed Overall  Cognitive Status: Within Functional Limits for tasks assessed                      Exercises      General Comments        Pertinent Vitals/Pain Pain Assessment: No/denies pain    Home Living Family/patient expects to be discharged to:: Private residence Living Arrangements: Alone ("someone is the home") Available Help at Discharge: Friend(s);Available PRN/intermittently;Family (only for distant supervision) Type of Home: House Home Access: Stairs to enter Entrance Stairs-Rails: Right Home Layout: Two level;Bed/bath upstairs;1/2 bath on main level Home Equipment: Bedside commode;Shower seat;Other (comment) (low vision cane)      Prior Function Level of Independence: Independent (reports having trouble with housekeeping )      Comments: uses cane intermittently, doesn't want to be dependent on it, opthamologist told her not to drive   PT Goals (current goals can now be found in the care plan section) Acute Rehab PT Goals Patient Stated Goal: I want to feel better  Progress towards PT goals: Progressing toward goals    Frequency  Min 3X/week    PT Plan Current plan remains appropriate    Co-evaluation             End of Session Equipment Utilized During Treatment: Gait belt Activity Tolerance: Patient tolerated treatment well Patient left: in bed;with call bell/phone within reach     Time: 1216-1232 PT Time Calculation (min) (ACUTE ONLY): 16 min  Charges:  $Gait Training: 8-22  mins                    G Codes:      Weston Anna, MPT Pager: [phone]

## 2015-09-07 NOTE — Progress Notes (Signed)
[facility] Progress Note  Patient: Adrienne [name] [id number]   PCP: Maggie Font, MD DOB: April 11, 1955   DOA: 09/05/2015   DOS: 09/07/2015   Date of Service: the patient was seen and examined on 09/07/2015  Subjective: Headache is resolved. Complaining about generalized fatigue. Mentions that she has not taken her insulin due to financial situation. Nutrition: Tolerating oral diet  Brief hospital course: Pt. with PMH of DM, HTN; admitted on 09/05/2015, with complaint of fatigue, was found to have DKA. Currently further plan is continue IV hydration.  Assessment and Plan: 1. DKA Likely from noncompliance of medication. Blood pressure significantly improved after IV fluid bolus Anion gap is closed. Advance diet. As the patient is unable to afford Lantus I would transition her to 7030 insulin 25 units twice a day, also add 8 units of short-acting insulin per diabetic educator.  2. History of hypertension. Currently hypotensive and therefore will discontinue blood pressure medication.   Pain management: When necessary Tylenol Activity: Consulted physical therapy Bowel regimen: last BM 09/06/2015 Diet: Carb modified diet DVT Prophylaxis: subcutaneous Heparin  Advance goals of care discussion: Full code  Family Communication: no family was present at bedside, at the time of interview.   Disposition:  Discharge to home. Expected discharge date: 09/08/2015 Consultants: none Procedures: none  Antibiotics: Anti-infectives    None        Intake/Output Summary (Last 24 hours) at 09/07/15 1806 Last data filed at 09/07/15 1342  Gross per 24 hour  Intake            642.5 ml  Output             2425 ml  Net          -1782.5 ml   Filed Weights   09/05/15 2044 09/06/15 0317  Weight: 81.6 kg (180 lb) 76.8 kg (169 lb 4.8 oz)    Objective: Physical Exam: Vitals:   09/06/15 1629 09/06/15 2018 09/07/15 0533 09/07/15 1330  BP: 114/65 123/70 128/64 (!) 120/55  Pulse:   71 61 69  Resp:  16 18 18   Temp:  98.2 F (36.8 C) 97.8 F (36.6 C) 97.9 F (36.6 C)  TempSrc:  Oral Oral Oral  SpO2:  100% 99% 100%  Weight:      Height:        General: Alert, Awake and Oriented to Time, Place and Person. Appear in mild distress Eyes: PERRL, Conjunctiva normal ENT: Oral Mucosa clear moist. Neck: no JVD, no Abnormal Mass Or lumps Cardiovascular: S1 and S2 Present, no Murmur, Respiratory: Bilateral Air entry equal and Decreased, Clear to Auscultation, no Crackles, no wheezes Abdomen: Bowel Sound present, Soft and no tenderness Skin: no redness, no Rash  Extremities: no Pedal edema, no calf tenderness Neurologic: Grossly no focal neuro deficit. Bilaterally Equal motor strength  Data Reviewed: CBC:  Recent Labs Lab 09/05/15 2059 09/05/15 2259 09/06/15 0033 09/07/15 0605  WBC 7.7  --   --  5.7  HGB 13.3 14.6 15.3* 9.9*  HCT 39.4 43.0 45.0 28.1*  MCV 92.1  --   --  90.4  PLT 423*  --   --  99991111   Basic Metabolic Panel:  Recent Labs Lab 09/05/15 2059 09/05/15 2259 09/06/15 0033 09/06/15 0101 09/06/15 0940 09/07/15 0605  NA 125* 126* 127* 132* 139 134*  K 5.7* 6.2* 6.9* 4.4 3.4* 4.3  CL 92* 94* 100* 103 113* 111  CO2 16*  --   --  16* 21* 18*  GLUCOSE 818* >700* 665* 525* 162* 381*  BUN 31* 36* 44* 29* 21* 12  CREATININE 1.70* 1.40* 1.30* 1.45* 0.85 0.85  CALCIUM 9.6  --   --  9.1 8.5* 8.1*    Liver Function Tests: No results for input(s): AST, ALT, ALKPHOS, BILITOT, PROT, ALBUMIN in the last 168 hours. No results for input(s): LIPASE, AMYLASE in the last 168 hours. No results for input(s): AMMONIA in the last 168 hours. Coagulation Profile: No results for input(s): INR, PROTIME in the last 168 hours. Cardiac Enzymes:  Recent Labs Lab 09/05/15 2356 09/06/15 0700 09/06/15 1155  TROPONINI <0.03 <0.03 <0.03   BNP (last 3 results) No results for input(s): PROBNP in the last 8760 hours.  CBG:  Recent Labs Lab 09/06/15 1321  09/06/15 1719 09/06/15 2140 09/07/15 0735 09/07/15 1656  GLUCAP 132* 223* 405* 345* 245*    Studies: No results found.   Scheduled Meds: . aspirin EC  81 mg Oral Daily  . brimonidine  1 drop Both Eyes BID  . clopidogrel  75 mg Oral Daily  . dorzolamide-timolol  1 drop Both Eyes BID  . insulin aspart  0-15 Units Subcutaneous TID WC  . insulin aspart  0-5 Units Subcutaneous QHS  . [START ON 09/08/2015] insulin aspart  8 Units Subcutaneous TID WC  . [START ON 09/08/2015] insulin aspart protamine- aspart  25 Units Subcutaneous BID WC   Continuous Infusions: . sodium chloride 75 mL/hr at 09/07/15 0449   PRN Meds: acetaminophen, ALPRAZolam  Time spent: 30 minutes  Author: Berle Mull, MD [facility] Pager: [phone] 09/07/2015 6:06 PM  If 7PM-7AM, please contact night-coverage at [email or web address], password [email or web address]

## 2015-09-07 NOTE — Evaluation (Signed)
Occupational Therapy Evaluation and Discharge Patient Details Name: Adrienne [name] MRN: [id number] DOB: [birth year] Today's Date: 09/07/2015    History of Present Illness Pt admit with hyperglycemia over 800, and recent falls (3 ) in past 2 weeks.    Clinical Impression   Pt is performing ADL and ADL transfers at a set up to supervision level due to hx of hypotension during hospitalization. No further OT needs.    Follow Up Recommendations  No OT follow up    Equipment Recommendations  None recommended by OT    Recommendations for Other Services       Precautions / Restrictions Precautions Precautions: Fall Precaution Comments: reports falls in the days leading up to hospitalization Restrictions Weight Bearing Restrictions: No      Mobility Bed Mobility Overal bed mobility: Modified Independent                Transfers Overall transfer level: Needs assistance   Transfers: Sit to/from Stand Sit to Stand: Supervision         General transfer comment: supervised for safety, no c/o dizziness    Balance                                            ADL Overall ADL's : Needs assistance/impaired Eating/Feeding: Independent;Sitting   Grooming: Wash/dry hands;Standing;Supervision/safety   Upper Body Bathing: Set up;Sitting   Lower Body Bathing: Sit to/from stand;Set up   Upper Body Dressing : Set up;Sitting   Lower Body Dressing: Set up;Sit to/from stand   Toilet Transfer: Supervision/safety   Toileting- Water quality scientist and Hygiene: Supervision/safety       Functional mobility during ADLs: Supervision/safety       Vision Additional Comments: pt with light only on R and has blurred vision in L, 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   Perception     Praxis      Pertinent Vitals/Pain Pain Assessment: No/denies pain     Hand Dominance Right   Extremity/Trunk Assessment Upper Extremity Assessment Upper  Extremity Assessment: Overall WFL for tasks assessed   Lower Extremity Assessment Lower Extremity Assessment: Defer to PT evaluation       Communication Communication Communication: No difficulties   Cognition Arousal/Alertness: Awake/alert Behavior During Therapy: WFL for tasks assessed/performed Overall Cognitive Status: Within Functional Limits for tasks assessed                     General Comments       Exercises       Shoulder Instructions      Home Living Family/patient expects to be discharged to:: Private residence Living Arrangements: Alone ("someone is the home") Available Help at Discharge: Friend(s);Available PRN/intermittently;Family (only for distant supervision) Type of Home: House Home Access: Stairs to enter [employer] of Steps: 3 Entrance Stairs-Rails: Right Home Layout: Two level;Bed/bath upstairs;1/2 bath on main level     Bathroom Shower/Tub: Occupational psychologist: Standard     Home Equipment: Bedside commode;Shower seat;Other (comment) (low vision cane)          Prior Functioning/Environment Level of Independence: Independent (reports having trouble with housekeeping )        Comments: uses cane intermittently, doesn't want to be dependent on it, opthamologist told her not to drive    OT Diagnosis: Generalized weakness;Blindness and low vision   OT  Problem List:     OT Treatment/Interventions:      OT Goals(Current goals can be found in the care plan section) Acute Rehab OT Goals Patient Stated Goal: I want to feel better   OT Frequency:     Barriers to D/C:            Co-evaluation              End of Session    Activity Tolerance: Patient tolerated treatment well Patient left: in chair;with call bell/phone within reach   Time: 1125-1201 OT Time Calculation (min): 36 min Charges:  OT General Charges $OT Visit: 1 Procedure OT Evaluation $OT Eval Moderate Complexity: 1  Procedure OT Treatments $Self Care/Home Management : 8-22 mins G-Codes:    Malka So 09/07/2015, 12:08 PM [phone]

## 2015-09-07 NOTE — Care Management Note (Signed)
Case Management Note  Patient Details  Name: Ariel Laubenstein MRN: [id number] Date of Birth: [birth year]  Subjective/Objective: 60 y/o f admitted w/htn,DM. From home. Hx: legally blind.No insurance-financial counselor-medicaid potential. PT-recc HHPT. Spoke to patient about d/c plans.[place] liason for TCC Opal Sidles will let me know if qualifies for TCC or if she can go to [facility] for pcp-await response.  Patient will need further instruction of drawaing up insulin. [facility] chosen for [employer] aware & will let me know if able to accept.Would recc HHRN, Education officer, museum. Patient will need script for glucometer,& strips. DM ed-see note for recc.                  Action/Plan:d/c plan home w/HHC.   Expected Discharge Date:                  Expected Discharge Plan:  [place]  In-House Referral:     Discharge planning Services  CM Consult, Medication Assistance, [facility]  Post Acute Care Choice:    Choice offered to:  Patient  DME Arranged:    DME Agency:     HH Arranged:    [place] Agency:  [place]  Status of Service:  In process, will continue to follow  If discussed at Long Length of Stay Meetings, dates discussed:    Additional Comments:  Dessa Phi, RN 09/07/2015, 3:40 PM

## 2015-09-08 ENCOUNTER — Telehealth: Payer: Self-pay

## 2015-09-08 LAB — BASIC METABOLIC PANEL
Anion gap: 4 — ABNORMAL LOW (ref 5–15)
BUN: 11 mg/dL (ref 6–20)
CALCIUM: 8.1 mg/dL — AB (ref 8.9–10.3)
CO2: 19 mmol/L — AB (ref 22–32)
CREATININE: 0.74 mg/dL (ref 0.44–1.00)
Chloride: 113 mmol/L — ABNORMAL HIGH (ref 101–111)
GFR calc non Af Amer: >60 mL/min (ref >60)
Glucose, Bld: 253 mg/dL — ABNORMAL HIGH (ref 65–99)
Potassium: 3.6 mmol/L (ref 3.5–5.1)
SODIUM: 136 mmol/L (ref 135–145)

## 2015-09-08 LAB — GLUCOSE, CAPILLARY
GLUCOSE-CAPILLARY: 245 mg/dL — AB (ref 65–99)
Glucose-Capillary: 268 mg/dL — ABNORMAL HIGH (ref 65–99)

## 2015-09-08 MED ORDER — INSULIN ASPART 100 UNIT/ML ~~LOC~~ SOLN: 8.0000 [IU] | Freq: Three times a day (TID) | SUBCUTANEOUS | 0 refills | Status: DC

## 2015-09-08 MED ORDER — INSULIN PEN NEEDLE 31G X 5 MM MISC: 1.0000 | Freq: Three times a day (TID) | 0 refills | Status: DC

## 2015-09-08 MED ORDER — INSULIN GLARGINE 100 UNIT/ML SOLOSTAR PEN: 40.0000 [IU] | PEN_INJECTOR | Freq: Every day | SUBCUTANEOUS | 0 refills | Status: DC

## 2015-09-08 MED ORDER — BLOOD GLUCOSE METER KIT: PACK | 0 refills | Status: DC

## 2015-09-08 MED FILL — !TRUE METRIX BLOOD GLUCOSE: 1 days supply | Qty: 1 | Fill #0

## 2015-09-08 MED FILL — TRUEplus LANCETS 28G MISC: 25 days supply | Qty: 100 | Fill #0

## 2015-09-08 MED FILL — !NOVOLOG 100UNITS/ML VIAL: 100/ML | 28 days supply | Qty: 10 | Fill #0

## 2015-09-08 MED FILL — TRUE METRIX TEST STRIP: 25 days supply | Qty: 100 | Fill #0

## 2015-09-08 MED FILL — TRUEPLUS SYR 0.5ML 30GX5/16: 30G X 5/16" | 25 days supply | Qty: 100 | Fill #0

## 2015-09-08 MED FILL — !LANTUS SOLOSTAR 100UNITS/M: 100 | 12 days supply | Qty: 12 | Fill #0

## 2015-09-08 NOTE — Progress Notes (Signed)
Nutrition Brief Note  Patient identified on the Malnutrition Screening Tool (MST) Report  Wt Readings from Last 15 Encounters:  09/06/15 169 lb 4.8 oz (76.8 kg)  12/19/13 190 lb 3.2 oz (86.3 kg)  11/19/13 179 lb 9.6 oz (81.5 kg)  11/05/13 180 lb (81.6 kg)    Body mass index is 27.33 kg/m. Patient meets criteria for overweight based on current BMI. No skin issues noted.   Current diet order is Carb Modified, patient is consuming 50-100% of meals at this time. Labs and medications reviewed.   Order for d/c in place, no summary yet. DM Coordinator, who is also an RD, saw pt on 8/21 PM and talked with her about importance of medication administration, complications if [id number] remains elevated. Pt admitted for hyperglycemia with hx of Type 2 DM.   No further nutrition interventions warranted at this time. If nutrition issues arise, please consult RD.    Jarome Matin, MS, RD, LDN Inpatient Clinical Dietitian Pager # [phone] After hours/weekend pager # [phone]

## 2015-09-08 NOTE — Care Management Note (Signed)
Case Management Note  Patient Details  Name: Amymarie Gal MRN: [id number] Date of Birth: 1955-03-29  Subjective/Objective:  Contacted [place] pharmacy-[place]-has Lantus Solo Star pen samples, & Novolog samples.MD can escript to [facility] pharmacy. Also can escript-glucometer,syringes,strips. Await [place] social worker order-[facility] already [place] aware.Patient has pcp appt.No further CM needs.                  Action/Plan:d/c home w/HHC.   Expected Discharge Date:                  Expected Discharge Plan:  [place]  In-House Referral:     Discharge planning Services  CM Consult, Medication Assistance, [facility]  Post Acute Care Choice:    Choice offered to:  Patient  DME Arranged:    DME Agency:     HH Arranged:  RN, Social Work [employer] Agency:  [place]  Status of Service:  Completed, signed off  If discussed at [employer], dates discussed:    Additional Comments:  Dessa Phi, RN 09/08/2015, 11:40 AM

## 2015-09-08 NOTE — Discharge Summary (Signed)
[facility] Discharge Summary   Patient: Adrienne [name] [id number]   PCP: Maggie Font, MD DOB: 03/22/1955   Date of admission: 09/05/2015   Date of discharge: 09/08/2015     Discharge Diagnoses:  Active Problems:   DKA, type 2, not at goal [facility])   HLD (hyperlipidemia)   Hypertension   Hyperkalemia   Fall   DKA (diabetic ketoacidoses) ([place])   Admitted From: Home Disposition:  Home with home health  Recommendations for Outpatient Follow-up:  1. Establish care with [facility]   Follow-up [place]. Go on 09/11/2015.   Specialty:  Internal Medicine Why:  at 3:30pm for an hospital follow up appointment Contact information: [address] [id number] mc [address] [place] [phone]       [place] [facility] .   Why:  Go directly to the pharmacy for meds @ d/c- they have been escripted to pharmacy. Contact information: [place] [phone] [place] .   Why:  [place] Education officer, museum. Contact information: [place] [address] [phone]          Diet recommendation: Cardiac and carb modified diet  Activity: The patient is advised to gradually reintroduce usual activities.  Discharge Condition: good  Code Status: Full code  History of present illness: As per the H and P dictated on admission, "Presented with severe hyperglycemia patient reports feeling lightheaded and increasing urinary frequency for at least 2 weeks she's been nauseous but not vomiting have been feeling increased thirst this and this has became severe today and she presented to emergency me she haven't checked her blood sugar but that has been taking her medications except that she ran out of her insulin and haven't had a chance to get her refill  secondary to trouble  of insurance reports has been out of insulin for the past 3 weeks. She reports have been feeling frequent falls, no head injury. Unsure if had LOC 2 weeks ago. She has been trying to drink Pepsi to help her thirst with no results. "  Hospital Course:  Summary of her active problems in the hospital is as following. 1. DKA Likely from noncompliance of medication. Tolerating diet. Anion gap remained closed. Case management was able to arrange insulin Lantus for the patient. Patient will discharged on insulin Lantus and NovoLog.  2. History of hypertension. Blood pressures are stable without medication. discontinue blood pressure medication  All other chronic medical condition were stable during the hospitalization.  Patient was seen by physical therapy, who recommended home health, which was arranged by Education officer, museum and case Freight forwarder. On the day of the discharge the patient's vitals were stable, and no other acute medical condition were reported by patient. the patient was felt safe to be discharge at home with home health.  Procedures and Results:  none   Consultations:  none  DISCHARGE MEDICATION: Discharge Medication List as of 09/08/2015  2:31 PM    START taking these medications   Details  blood glucose meter kit and supplies Dispense based on patient and insurance preference. Use up to four times daily as directed. (FOR ICD-9 250.00, 250.01)., Print    Insulin Pen Needle 31G X 5 MM MISC 1 Act by Does not apply route 4 (four) times daily - after meals and at bedtime., Starting Tue 09/08/2015, Normal  CONTINUE these medications which have CHANGED   Details  insulin aspart (NOVOLOG) 100 UNIT/ML injection Inject 8 Units into the skin 3 (three) times daily with meals., Starting Tue 09/08/2015, Normal    Insulin Glargine (LANTUS) 100 UNIT/ML Solostar Pen Inject 40 Units into the skin daily at 10 pm., Starting Tue 09/08/2015, Normal      CONTINUE these medications which have  NOT CHANGED   Details  ALPRAZolam (XANAX) 0.5 MG tablet Take 1 tablet (0.5 mg total) by mouth as needed for anxiety., Starting Tue 11/19/2013, Print    aspirin EC 81 MG tablet Take 81 mg by mouth daily. , Historical Med    brimonidine (ALPHAGAN) 0.15 % ophthalmic solution Place 1 drop into both eyes 2 (two) times daily. , Starting Wed 05/20/2015, Historical Med    clopidogrel (PLAVIX) 75 MG tablet Take 75 mg by mouth daily. , Starting Wed 11/06/2013, Historical Med    dorzolamide-timolol (COSOPT) 22.3-6.8 MG/ML ophthalmic solution Place 1 drop into both eyes 2 (two) times daily. , Starting Wed 08/05/2015, Historical Med    metFORMIN (GLUCOPHAGE) 1000 MG tablet Take 1,000 mg by mouth 2 (two) times daily., Starting Wed 08/05/2015, Historical Med      STOP taking these medications     carvedilol (COREG) 12.5 MG tablet      insulin glargine (LANTUS) 100 UNIT/ML injection      insulin lispro (HUMALOG KWIKPEN) 100 UNIT/ML KiwkPen      spironolactone (ALDACTONE) 50 MG tablet      valsartan (DIOVAN) 320 MG tablet        Allergies  Allergen Reactions  . Codeine Itching  . Hydrocodone Itching  . Niacin And Related Itching  . Oxycodone Itching  . Penicillin G Itching   Discharge Instructions    Diet - low sodium heart healthy    Complete by:  As directed   Diet Carb Modified    Complete by:  As directed   Discharge instructions    Complete by:  As directed   It is important that you read following instructions as well as go over your medication list with RN to help you understand your care after this hospitalization.  Discharge Instructions: Please follow-up with PCP in one week  Please request your primary care physician to go over all Hospital Tests and Procedure/Radiological results at the follow up,  Please get all Hospital records sent to your PCP by signing hospital release before you go home.   Do not take more than prescribed Pain, Sleep and Anxiety Medications. You were  cared for by a hospitalist during your hospital stay. If you have any questions about your discharge medications or the care you received while you were in the hospital after you are discharged, you can call the unit and ask to speak with the hospitalist on call if the hospitalist that took care of you is not available.  Once you are discharged, your primary care physician will handle any further medical issues. Please note that NO REFILLS for any discharge medications will be authorized once you are discharged, as it is imperative that you return to your primary care physician (or establish a relationship with a primary care physician if you do not have one) for your aftercare needs so that they can reassess your need for medications and monitor your lab values. You Must read complete instructions/literature along with all the possible adverse reactions/side effects for all the Medicines you take and that have been prescribed to you. Take any new  Medicines after you have completely understood and accept all the possible adverse reactions/side effects. Wear Seat belts while driving.   Increase activity slowly    Complete by:  As directed     Discharge Exam: Filed Weights   09/05/15 2044 09/06/15 0317  Weight: 81.6 kg (180 lb) 76.8 kg (169 lb 4.8 oz)   Vitals:   09/07/15 2122 09/08/15 0434  BP: 136/68 (!) 120/55  Pulse: 64 61  Resp: 18 16  Temp: 98.3 F (36.8 C) 98.5 F (36.9 C)   General: Appear in no distress, no Rash; Oral Mucosa moist. Cardiovascular: S1 and S2 Present, no Murmur, no JVD Respiratory: Bilateral Air entry present and Clear to Auscultation, no Crackles, no wheezes Abdomen: Bowel Sound present, Soft and no tenderness Extremities: no Pedal edema, no calf tenderness Neurology: Grossly no focal neuro deficit.  The results of significant diagnostics from this hospitalization (including imaging, microbiology, ancillary and laboratory) are listed below for reference.     Significant Diagnostic Studies: No results found.  Microbiology: No results found for this or any previous visit (from the past 240 hour(s)).   Labs: CBC:  Recent Labs Lab 09/05/15 2059 09/05/15 2259 09/06/15 0033 09/07/15 0605  WBC 7.7  --   --  5.7  HGB 13.3 14.6 15.3* 9.9*  HCT 39.4 43.0 45.0 28.1*  MCV 92.1  --   --  90.4  PLT 423*  --   --  353   Basic Metabolic Panel:  Recent Labs Lab 09/05/15 2059  09/06/15 0033 09/06/15 0101 09/06/15 0940 09/07/15 0605 09/08/15 0602  NA 125*  < > 127* 132* 139 134* 136  K 5.7*  < > 6.9* 4.4 3.4* 4.3 3.6  CL 92*  < > 100* 103 113* 111 113*  CO2 16*  --   --  16* 21* 18* 19*  GLUCOSE 818*  < > 665* 525* 162* 381* 253*  BUN 31*  < > 44* 29* 21* 12 11  CREATININE 1.70*  < > 1.30* 1.45* 0.85 0.85 0.74  CALCIUM 9.6  --   --  9.1 8.5* 8.1* 8.1*  < > = values in this interval not displayed. Liver Function Tests: No results for input(s): AST, ALT, ALKPHOS, BILITOT, PROT, ALBUMIN in the last 168 hours. No results for input(s): LIPASE, AMYLASE in the last 168 hours. No results for input(s): AMMONIA in the last 168 hours. Cardiac Enzymes:  Recent Labs Lab 09/05/15 2356 09/06/15 0700 09/06/15 1155  TROPONINI <0.03 <0.03 <0.03   BNP (last 3 results) No results for input(s): BNP in the last 8760 hours. CBG:  Recent Labs Lab 09/07/15 0735 09/07/15 1656 09/07/15 2134 09/08/15 0732 09/08/15 1122  GLUCAP 345* 245* 268* 268* 245*   Time spent: 30 minutes  Signed:  PATEL, PRANAV  [facility] 09/08/2015 , 6:47 PM

## 2015-09-08 NOTE — Telephone Encounter (Signed)
Message from [facility], Rn CM inquiring if the [facility] has samples of NPH insulin.  As per Phillis Knack, [facility] Pharmacist, there are samples of novolog 70/30 in the pharmacy.  udpate provided to K. Mahabir, RN CM

## 2015-09-11 ENCOUNTER — Encounter: Payer: Self-pay | Admitting: Physician Assistant

## 2015-09-11 ENCOUNTER — Ambulatory Visit: Payer: Self-pay | Attending: Internal Medicine | Admitting: Physician Assistant

## 2015-09-11 ENCOUNTER — Other Ambulatory Visit: Payer: Self-pay | Admitting: Physician Assistant

## 2015-09-11 VITALS — BP 132/83 | HR 91 | Temp 98.6°F | Resp 16 | Wt 179.6 lb

## 2015-09-11 DIAGNOSIS — D508 Other iron deficiency anemias: Secondary | ICD-10-CM | POA: Insufficient documentation

## 2015-09-11 DIAGNOSIS — I10 Essential (primary) hypertension: Secondary | ICD-10-CM | POA: Insufficient documentation

## 2015-09-11 DIAGNOSIS — Z880 Allergy status to penicillin: Secondary | ICD-10-CM | POA: Insufficient documentation

## 2015-09-11 DIAGNOSIS — E1110 Type 2 diabetes mellitus with ketoacidosis without coma: Secondary | ICD-10-CM

## 2015-09-11 DIAGNOSIS — Z79899 Other long term (current) drug therapy: Secondary | ICD-10-CM | POA: Insufficient documentation

## 2015-09-11 DIAGNOSIS — Z794 Long term (current) use of insulin: Secondary | ICD-10-CM | POA: Insufficient documentation

## 2015-09-11 DIAGNOSIS — Z7982 Long term (current) use of aspirin: Secondary | ICD-10-CM | POA: Insufficient documentation

## 2015-09-11 DIAGNOSIS — E1310 Other specified diabetes mellitus with ketoacidosis without coma: Secondary | ICD-10-CM | POA: Insufficient documentation

## 2015-09-11 LAB — CBC WITH DIFFERENTIAL/PLATELET
BASOS ABS: 0 {cells}/uL (ref 0–200)
Basophils Relative: 0 %
EOS ABS: 210 {cells}/uL (ref 15–500)
Eosinophils Relative: 3 %
HCT: 30 % — ABNORMAL LOW (ref 35.0–45.0)
HEMOGLOBIN: 9.8 g/dL — AB (ref 11.7–15.5)
LYMPHS ABS: 1820 {cells}/uL (ref 850–3900)
Lymphocytes Relative: 26 %
MCH: 31 pg (ref 27.0–33.0)
MCHC: 32.7 g/dL (ref 32.0–36.0)
MCV: 94.9 fL (ref 80.0–100.0)
MPV: 10.3 fL (ref 7.5–12.5)
Monocytes Absolute: 420 cells/uL (ref 200–950)
Monocytes Relative: 6 %
NEUTROS ABS: 4550 {cells}/uL (ref 1500–7800)
Neutrophils Relative %: 65 %
Platelets: 425 10*3/uL — ABNORMAL HIGH (ref 140–400)
RBC: 3.16 MIL/uL — ABNORMAL LOW (ref 3.80–5.10)
RDW: 13.3 % (ref 11.0–15.0)
WBC: 7 10*3/uL (ref 3.8–10.8)

## 2015-09-11 LAB — GLUCOSE, POCT (MANUAL RESULT ENTRY): POC Glucose: 259 mg/dl — AB (ref 70–99)

## 2015-09-11 NOTE — Patient Instructions (Addendum)
Check Blood sugar 3x daily and record.  Hold Novolog dose if blood sugar is at or under 150.

## 2015-09-11 NOTE — Progress Notes (Signed)
Pt is in the office today for hospital f/u Pt states she is not in any pain

## 2015-09-11 NOTE — Progress Notes (Signed)
Adrienne [name], is a 60 y.o. female  YSH:[id number]  [id number]  DOB - Jan 10, 1956  Subjective:  Chief Complaint and HPI: Adrienne [name] is a 60 y.o. female here today to establish care and for a follow up visit after a recent hospital admission from 09/05/2015-09/08/2015 for uncontrolled diabetes. She was experiencing lightheadedness, polyuria, and polydipsia for about 3 weeks after running out of her meds.  Her last doctor's visit was with Tacey Heap in about April or May of this year.  She has since lost health insurance and had trouble getting her meds filled and could no longer afford lantus.  For the few months prior to hospitalization she was only taking metformin and sliding scale Novolog because she didn't have Lantus.  She has been having some mild leg swelling the last few days.  Home health and social worker are assigned to her case. No s/sx of volume loss/bleeding- Light headedness and dizziness has resolved.   ED/Hospital notes reviewed.  Of note but I don't see that it was addressed:  09/06/2015 Hgb=15.3; Hct=45.0 09/07/2015 Hgb=9.9;  Hct 28.1   ROS:   Constitutional:  No f/c, No night sweats, No unexplained weight loss. EENT:  No vision changes, No blurry vision, No hearing changes. No mouth, throat, or ear problems.  Respiratory: No cough, No SOB Cardiac: No CP, no palpitations GI:  No abd pain, No N/V/D. GU: No Urinary s/sx Musculoskeletal: No joint pain Neuro: No headache, no dizziness, no motor weakness.  Skin: No rash Endocrine:  No polydipsia. No polyuria.  Psych: Denies SI/HI  No problems updated.  ALLERGIES: Allergies  Allergen Reactions  . Codeine Itching  . Hydrocodone Itching  . Niacin And Related Itching  . Oxycodone Itching  . Penicillin G Itching    PAST MEDICAL HISTORY: Past Medical History:  Diagnosis Date  . Diabetes ([place])   . High cholesterol   . Hypertension     MEDICATIONS AT HOME: Prior to Admission medications     Medication Sig Start Date End Date Taking? Authorizing Provider  ALPRAZolam Duanne Moron) 0.5 MG tablet Take 1 tablet (0.5 mg total) by mouth as needed for anxiety. 11/19/13  Yes Rosalin Hawking, MD  aspirin EC 81 MG tablet Take 81 mg by mouth daily.    Yes Historical Provider, MD  blood glucose meter kit and supplies Dispense based on patient and insurance preference. Use up to four times daily as directed. (FOR ICD-9 250.00, 250.01). 09/08/15  Yes Lavina Hamman, MD  brimonidine (ALPHAGAN) 0.15 % ophthalmic solution Place 1 drop into both eyes 2 (two) times daily.  05/20/15  Yes Historical Provider, MD  clopidogrel (PLAVIX) 75 MG tablet Take 75 mg by mouth daily.  11/06/13  Yes Historical Provider, MD  dorzolamide-timolol (COSOPT) 22.3-6.8 MG/ML ophthalmic solution Place 1 drop into both eyes 2 (two) times daily.  08/05/15  Yes Historical Provider, MD  insulin aspart (NOVOLOG) 100 UNIT/ML injection Inject 8 Units into the skin 3 (three) times daily with meals. 09/08/15  Yes Lavina Hamman, MD  Insulin Glargine (LANTUS) 100 UNIT/ML Solostar Pen Inject 40 Units into the skin daily at 10 pm. 09/08/15  Yes Lavina Hamman, MD  Insulin Pen Needle 31G X 5 MM MISC 1 Act by Does not apply route 4 (four) times daily - after meals and at bedtime. 09/08/15  Yes Lavina Hamman, MD  metFORMIN (GLUCOPHAGE) 1000 MG tablet Take 1,000 mg by mouth 2 (two) times daily. 08/05/15  Yes Historical Provider,  MD     Objective:  EXAM:   Vitals:   09/11/15 1531  BP: 132/83  Pulse: 91  Resp: 16  Temp: 98.6 F (37 C)  TempSrc: Oral  SpO2: 99%  Weight: 179 lb 9.6 oz (81.5 kg)    General appearance : A&OX3. NAD. Non-toxic-appearing HEENT: Atraumatic and Normocephalic.  PERRLA. EOM intact. Neck: supple, no JVD. No cervical lymphadenopathy. No thyromegaly Chest/Lungs:  Breathing-non-labored, Good air entry bilaterally, breath sounds normal without rales, rhonchi, or wheezing  CVS: S1 S2 regular, no murmurs, gallops, rubs   Extremities: Bilateral Lower Ext shows <1+ edema, both legs are warm to touch with = pulse throughout Neurology:  CN II-XII grossly intact, Non focal.   Psych:  TP linear. J/I WNL. Normal speech. Appropriate eye contact and affect.  Skin:  No Rash  Data Review Lab Results  Component Value Date   HGBA1C 15.0 (H) 09/06/2015     Assessment & Plan   1. DKA, type 2, not at goal [facility]) Blood sugars improving - Glucose (CBG) - Basic metabolic panel Check Blood sugar 3x daily and record. Hold Novolog dose if blood sugar is at or under 150.  She is currently taking Lantus 35units at bedtime and Novolog 8 units with meals tid in addition to metformin. ROI filled out for Cardiologist(Dr Tamsen Snider) and PCP Dr Iona Beard   2. Other iron deficiency anemias Recent drop in hospital of hemoglobin that I do not see was addressed as inpatient.  She is asymptomatic - CBC with Differential/Platelet   Patient have been counseled extensively about nutrition and exercise  Return in about 3 weeks (around 10/02/2015) for establish with PCP; f/up diabetes.  The patient was given clear instructions to go to ER or return to medical center if symptoms don't improve, worsen or new problems develop. The patient verbalized understanding. The patient was told to call to get lab results if they haven't heard anything in the next week.     Freeman Caldron, PA-C [facility] and [place] [place], [place]   09/11/2015, 5:48 PM

## 2015-09-12 LAB — BASIC METABOLIC PANEL
BUN: 15 mg/dL (ref 7–25)
CALCIUM: 9 mg/dL (ref 8.6–10.4)
CO2: 20 mmol/L (ref 20–31)
Chloride: 108 mmol/L (ref 98–110)
Creat: 0.9 mg/dL (ref 0.50–0.99)
GLUCOSE: 248 mg/dL — AB (ref 65–99)
Potassium: 3.8 mmol/L (ref 3.5–5.3)
Sodium: 140 mmol/L (ref 135–146)

## 2015-09-14 ENCOUNTER — Other Ambulatory Visit: Payer: Self-pay | Admitting: Physician Assistant

## 2015-09-14 ENCOUNTER — Telehealth: Payer: Self-pay

## 2015-09-14 DIAGNOSIS — D509 Iron deficiency anemia, unspecified: Secondary | ICD-10-CM

## 2015-09-14 NOTE — Telephone Encounter (Signed)
Contacted pt to go over lab results pt is aware of lab results and is aware of the addition labs being added on and the colonoscopy referral being placed. Pt states she doesn't have any questions or concerns

## 2015-09-15 ENCOUNTER — Telehealth: Payer: Self-pay | Admitting: Gastroenterology

## 2015-09-15 LAB — IBC PANEL
%SAT: 27 % (ref 11–50)
TIBC: 256 ug/dL (ref 250–450)
UIBC: 187 ug/dL (ref 125–400)

## 2015-09-15 LAB — VITAMIN B12: VITAMIN B 12: 436 pg/mL (ref 200–1100)

## 2015-09-15 LAB — FOLATE: FOLATE: 6.3 ng/mL (ref >5.4)

## 2015-09-15 LAB — IRON: Iron: 69 ug/dL (ref 45–160)

## 2015-09-15 LAB — FERRITIN: FERRITIN: 150 ng/mL (ref 20–288)

## 2015-09-15 NOTE — Telephone Encounter (Signed)
routine

## 2015-09-15 NOTE — Telephone Encounter (Signed)
Patient states GI hx w-Dr. Sharlett Iles 20 years ago. GI hx w-[facility] 8-9 years ago. Patient will have [facility] records faxed to our office for review.

## 2015-09-18 ENCOUNTER — Other Ambulatory Visit: Payer: Self-pay | Admitting: Physician Assistant

## 2015-09-18 ENCOUNTER — Telehealth: Payer: Self-pay | Admitting: Internal Medicine

## 2015-09-18 MED ORDER — INSULIN GLARGINE 100 UNIT/ML SOLOSTAR PEN: 30.0000 [IU] | PEN_INJECTOR | Freq: Every day | SUBCUTANEOUS | 0 refills | Status: DC

## 2015-09-18 NOTE — Telephone Encounter (Signed)
Please call and have them adjust her Lantus to 30 units at bedtime instead of 40units at bedtime.  Record blood sugars and call back if needed.  To ED if she is symptomatic.

## 2015-09-18 NOTE — Telephone Encounter (Signed)
Will forward to Dr. Thereasa Solo

## 2015-09-18 NOTE — Telephone Encounter (Signed)
Contacted Margaretha Sheffield and informed of Angela's changes to the patients lantus Margaretha Sheffield states she is aware and understand the changes and will make patient aware

## 2015-09-18 NOTE — Telephone Encounter (Signed)
Adrienne [name] from [facility] calling to inform that pt woke up sweating, but did not check blood sugar, instead pt ate fruit then checked blood sugar and it was 130  [name] from [facility] states this is the second time this has occurred. Pt states she cannot check blood sugar right when she wake up, pt instantly needs food  Adrienne [name] states pt has one pedal edema in left lower extremity and 1 trace edema in right  Contact for more information: [phone]

## 2015-09-23 ENCOUNTER — Telehealth: Payer: Self-pay

## 2015-09-23 NOTE — Telephone Encounter (Signed)
-----   Message from Argentina Donovan, [place] sent at 09/17/2015  8:50 AM EDT ----- Please call patient and let her know that her additional labs I added to her blood work were B12, folate, and Iron studies and were all normal.  I have put in a referral for colonoscopy because of the low hemoglobin.  No other action required at this time other than keeping her follow-up appointment with her assigned PCP.   Thanks, Freeman Caldron, PA-C

## 2015-09-23 NOTE — Telephone Encounter (Signed)
Patient hipaa verif; advised of recent lab results/colonoscopy referral per PA McClung. Patient verbalized understanding.

## 2015-10-05 ENCOUNTER — Ambulatory Visit: Payer: Self-pay | Attending: Family Medicine | Admitting: Family Medicine

## 2015-10-05 ENCOUNTER — Encounter: Payer: Self-pay | Admitting: Family Medicine

## 2015-10-05 VITALS — BP 147/76 | HR 82 | Temp 98.2°F | Resp 18 | Ht 66.0 in | Wt 180.2 lb

## 2015-10-05 DIAGNOSIS — D6489 Other specified anemias: Secondary | ICD-10-CM

## 2015-10-05 DIAGNOSIS — Z7902 Long term (current) use of antithrombotics/antiplatelets: Secondary | ICD-10-CM | POA: Insufficient documentation

## 2015-10-05 DIAGNOSIS — Z7982 Long term (current) use of aspirin: Secondary | ICD-10-CM | POA: Insufficient documentation

## 2015-10-05 DIAGNOSIS — E785 Hyperlipidemia, unspecified: Secondary | ICD-10-CM

## 2015-10-05 DIAGNOSIS — E1169 Type 2 diabetes mellitus with other specified complication: Secondary | ICD-10-CM

## 2015-10-05 DIAGNOSIS — Z23 Encounter for immunization: Secondary | ICD-10-CM

## 2015-10-05 DIAGNOSIS — H3411 Central retinal artery occlusion, right eye: Secondary | ICD-10-CM

## 2015-10-05 DIAGNOSIS — Z79899 Other long term (current) drug therapy: Secondary | ICD-10-CM | POA: Insufficient documentation

## 2015-10-05 DIAGNOSIS — D649 Anemia, unspecified: Secondary | ICD-10-CM | POA: Insufficient documentation

## 2015-10-05 LAB — CBC WITH DIFFERENTIAL/PLATELET
BASOS PCT: 1 %
Basophils Absolute: 71 cells/uL (ref 0–200)
EOS PCT: 4 %
Eosinophils Absolute: 284 cells/uL (ref 15–500)
HEMATOCRIT: 33.3 % — AB (ref 35.0–45.0)
Hemoglobin: 10.8 g/dL — ABNORMAL LOW (ref 11.7–15.5)
LYMPHS ABS: 2201 {cells}/uL (ref 850–3900)
LYMPHS PCT: 31 %
MCH: 30.6 pg (ref 27.0–33.0)
MCHC: 32.4 g/dL (ref 32.0–36.0)
MCV: 94.3 fL (ref 80.0–100.0)
MONO ABS: 568 {cells}/uL (ref 200–950)
MPV: 9.3 fL (ref 7.5–12.5)
Monocytes Relative: 8 %
NEUTROS ABS: 3976 {cells}/uL (ref 1500–7800)
NEUTROS PCT: 56 %
Platelets: 527 10*3/uL — ABNORMAL HIGH (ref 140–400)
RBC: 3.53 MIL/uL — AB (ref 3.80–5.10)
RDW: 14.1 % (ref 11.0–15.0)
WBC: 7.1 10*3/uL (ref 3.8–10.8)

## 2015-10-05 LAB — GLUCOSE, POCT (MANUAL RESULT ENTRY): POC GLUCOSE: 143 mg/dL — AB (ref 70–99)

## 2015-10-05 MED ORDER — CLOPIDOGREL BISULFATE 75 MG PO TABS: 75.0000 mg | ORAL_TABLET | Freq: Every day | ORAL | 5 refills | Status: DC

## 2015-10-05 MED ORDER — INSULIN GLARGINE 100 UNIT/ML SOLOSTAR PEN
30.0000 [IU] | PEN_INJECTOR | Freq: Every day | SUBCUTANEOUS | 5 refills | Status: DC
Start: 2015-10-05 — End: 2015-12-15

## 2015-10-05 MED ORDER — METFORMIN HCL 1000 MG PO TABS: 1000.0000 mg | ORAL_TABLET | Freq: Two times a day (BID) | ORAL | 5 refills | Status: DC

## 2015-10-05 MED ORDER — ATORVASTATIN CALCIUM 40 MG PO TABS: 40.0000 mg | ORAL_TABLET | Freq: Every day | ORAL | 5 refills | Status: DC

## 2015-10-05 MED ORDER — INSULIN ASPART 100 UNIT/ML ~~LOC~~ SOLN
8.0000 [IU] | Freq: Three times a day (TID) | SUBCUTANEOUS | 5 refills | Status: DC
Start: 2015-10-05 — End: 2015-12-15

## 2015-10-05 MED FILL — !NOVOLOG 100UNITS/ML VIAL: 100/ML | 28 days supply | Qty: 10 | Fill #0

## 2015-10-05 MED FILL — CLOPIDOGREL 75 MG TABLET: 75 | 30 days supply | Qty: 30 | Fill #0

## 2015-10-05 MED FILL — ?METFORMIN HCL 1,000 MG TAB: 1000 | 30 days supply | Qty: 60 | Fill #0

## 2015-10-05 MED FILL — LANTUS SOLOSTAR 100 UNITS/M: 100 | 28 days supply | Qty: 9 | Fill #0

## 2015-10-05 MED FILL — ATORVASTATIN 40 MG TABLET: 40 | 30 days supply | Qty: 30 | Fill #0

## 2015-10-05 NOTE — Progress Notes (Signed)
Subjective:  Patient ID: Adrienne [name], female    DOB: June 24, 1955  Age: 60 y.o. MRN: [id number]  CC: Diabetes   HPI Adrienne [name] is a 60 year old female with a history of uncontrolled type 2 diabetes mellitus (A1c 15.0), anemia, central retinal artery occlusion, hypertension who comes into the clinic for a follow-up visit.  She has been compliant with 30 units of Lantus and denies hypoglycemia. Blood sugar log reveals fasting sugars ranging from 84 to 254 and random sugars in the 270 range. She has been unable to continue with her regular primary care doctor due to loss of insurance.  Labs from last visit revealed anemia for which she was referred to GI for colonoscopy and is currently awaiting an appointment.  Denies shortness of breath, chest pains or pedal edema at this time but tells me that in the past she was on Lasix for pedal edema.  2-D echo from 11/2013 revealed EF of 45-50%, diffuse hypokinesis; she is not currently followed by cardiology.  Past Medical History:  Diagnosis Date  . Diabetes ([place])   . High cholesterol   . Hypertension     Past Surgical History:  Procedure Laterality Date  . CATARACT EXTRACTION, BILATERAL     Laser eye surgery, cataract surgery bi-lat  . CESAREAN SECTION     Twins, Breech  . EYE SURGERY Bilateral 2012   LASER, CATARACT  . TEE WITHOUT CARDIOVERSION N/A 12/31/2013   Procedure: TRANSESOPHAGEAL ECHOCARDIOGRAM (TEE);  Surgeon: Laverda Page, MD;  Location: [place];  Service: Cardiovascular;  Laterality: N/A;  H&P in file  . TUBAL LIGATION Bilateral     Allergies  Allergen Reactions  . Codeine Itching  . Hydrocodone Itching  . Niacin And Related Itching  . Oxycodone Itching  . Penicillin G Itching     Outpatient Medications Prior to Visit  Medication Sig Dispense Refill  . aspirin EC 81 MG tablet Take 81 mg by mouth daily.     . blood glucose meter kit and supplies Dispense based on patient and insurance  preference. Use up to four times daily as directed. (FOR ICD-9 250.00, 250.01). 1 each 0  . brimonidine (ALPHAGAN) 0.15 % ophthalmic solution Place 1 drop into both eyes 2 (two) times daily.     . dorzolamide-timolol (COSOPT) 22.3-6.8 MG/ML ophthalmic solution Place 1 drop into both eyes 2 (two) times daily.   2  . Insulin Pen Needle 31G X 5 MM MISC 1 Act by Does not apply route 4 (four) times daily - after meals and at bedtime. 300 each 0  . clopidogrel (PLAVIX) 75 MG tablet Take 75 mg by mouth daily.     . insulin aspart (NOVOLOG) 100 UNIT/ML injection Inject 8 Units into the skin 3 (three) times daily with meals. 10 mL 0  . Insulin Glargine (LANTUS) 100 UNIT/ML Solostar Pen Inject 30 Units into the skin daily at 10 pm. 15 mL 0  . metFORMIN (GLUCOPHAGE) 1000 MG tablet Take 1,000 mg by mouth 2 (two) times daily.  2  . ALPRAZolam (XANAX) 0.5 MG tablet Take 1 tablet (0.5 mg total) by mouth as needed for anxiety. (Patient not taking: Reported on 10/05/2015) 3 tablet 0   No facility-administered medications prior to visit.     ROS Review of Systems  Constitutional: Negative for activity change, appetite change and fatigue.  HENT: Negative for congestion, sinus pressure and sore throat.   Eyes: Negative for visual disturbance.  Respiratory: Negative for cough, chest tightness, shortness of breath  and wheezing.   Cardiovascular: Negative for chest pain and palpitations.  Gastrointestinal: Negative for abdominal distention, abdominal pain and constipation.  Endocrine: Negative for polydipsia.  Genitourinary: Negative for dysuria and frequency.  Musculoskeletal: Negative for arthralgias and back pain.  Skin: Negative for rash.  Neurological: Negative for tremors, light-headedness and numbness.  Hematological: Does not bruise/bleed easily.  Psychiatric/Behavioral: Negative for agitation and behavioral problems.    Objective:  BP (!) 147/76 (BP Location: Right Arm, Patient Position: Sitting,  Cuff Size: Normal)   Pulse 82   Temp 98.2 F (36.8 C) (Oral)   Resp 18   Ht 5' 6"  (1.676 m)   Wt 180 lb 3.2 oz (81.7 kg)   SpO2 100%   BMI 29.09 kg/m   BP/Weight 10/05/2015 09/11/2015 5/64/3329  Systolic BP 518 841 660  Diastolic BP 76 83 55  Wt. (Lbs) 180.2 179.6 -  BMI 29.09 28.99 -      Physical Exam  Constitutional: She is oriented to person, place, and time. She appears well-developed and well-nourished.  Cardiovascular: Normal rate and intact distal pulses.   Murmur heard. Pulmonary/Chest: Effort normal and breath sounds normal. She has no wheezes. She has no rales. She exhibits no tenderness.  Abdominal: Soft. Bowel sounds are normal. She exhibits no distension and no mass. There is no tenderness.  Musculoskeletal: Normal range of motion.  Neurological: She is alert and oriented to person, place, and time.      Lab Results  Component Value Date   HGBA1C 15.0 (H) 09/06/2015    Assessment & Plan:   1. Type 2 diabetes mellitus with other specified complication (HCC) Y3K of 15.0 Blood sugar log reveals some improvement The regimen changed today and she has been provided with a blood sugar log for for for recording of sugars which will be reviewed at her next visit and insulin adjusted as needed. - Glucose (CBG) - insulin aspart (NOVOLOG) 100 UNIT/ML injection; Inject 8 Units into the skin 3 (three) times daily with meals.  Dispense: 10 mL; Refill: 5 - Insulin Glargine (LANTUS) 100 UNIT/ML Solostar Pen; Inject 30 Units into the skin daily at 10 pm.  Dispense: 15 mL; Refill: 5 - metFORMIN (GLUCOPHAGE) 1000 MG tablet; Take 1 tablet (1,000 mg total) by mouth 2 (two) times daily.  Dispense: 60 tablet; Refill: 5  2. CRAO (central retinal artery occlusion), right - clopidogrel (PLAVIX) 75 MG tablet; Take 1 tablet (75 mg total) by mouth daily.  Dispense: 30 tablet; Refill: 5  3. Encounter for immunization - Glucose (CBG) - Flu Vaccine QUAD 36+ mos IM  4. HLD  (hyperlipidemia) Currently not on a statin. Statin initiated - atorvastatin (LIPITOR) 40 MG tablet; Take 1 tablet (40 mg total) by mouth daily.  Dispense: 30 tablet; Refill: 5  5. Anemia due to other cause Referred for colonoscopy as part of the anemia workup  - CBC with Differential/Platelet   Meds ordered this encounter  Medications  . clopidogrel (PLAVIX) 75 MG tablet    Sig: Take 1 tablet (75 mg total) by mouth daily.    Dispense:  30 tablet    Refill:  5  . insulin aspart (NOVOLOG) 100 UNIT/ML injection    Sig: Inject 8 Units into the skin 3 (three) times daily with meals.    Dispense:  10 mL    Refill:  5  . Insulin Glargine (LANTUS) 100 UNIT/ML Solostar Pen    Sig: Inject 30 Units into the skin daily at 10 pm.  Dispense:  15 mL    Refill:  5  . metFORMIN (GLUCOPHAGE) 1000 MG tablet    Sig: Take 1 tablet (1,000 mg total) by mouth 2 (two) times daily.    Dispense:  60 tablet    Refill:  5  . atorvastatin (LIPITOR) 40 MG tablet    Sig: Take 1 tablet (40 mg total) by mouth daily.    Dispense:  30 tablet    Refill:  5    Follow-up: Return in about 3 weeks (around 10/26/2015) for Follow-up of diabetes mellitus.   Arnoldo Morale MD

## 2015-10-05 NOTE — Progress Notes (Signed)
Patient is here for DM FU  Patient denies pain at this time.  Patient has taken medication today and patient has eaten today. 

## 2015-10-05 NOTE — Patient Instructions (Signed)
Diabetes Mellitus and Food It is important for you to manage your blood sugar (glucose) level. Your blood glucose level can be greatly affected by what you eat. Eating healthier foods in the appropriate amounts throughout the day at about the same time each day will help you control your blood glucose level. It can also help slow or prevent worsening of your diabetes mellitus. Healthy eating may even help you improve the level of your blood pressure and reach or maintain a healthy weight.  General recommendations for healthful eating and cooking habits include:  Eating meals and snacks regularly. Avoid going long periods of time without eating to lose weight.  Eating a diet that consists mainly of plant-based foods, such as fruits, vegetables, nuts, legumes, and whole grains.  Using low-heat cooking methods, such as baking, instead of high-heat cooking methods, such as deep frying. Work with your dietitian to make sure you understand how to use the Nutrition Facts information on food labels. HOW CAN FOOD AFFECT ME? Carbohydrates Carbohydrates affect your blood glucose level more than any other type of food. Your dietitian will help you determine how many carbohydrates to eat at each meal and teach you how to count carbohydrates. Counting carbohydrates is important to keep your blood glucose at a healthy level, especially if you are using insulin or taking certain medicines for diabetes mellitus. Alcohol Alcohol can cause sudden decreases in blood glucose (hypoglycemia), especially if you use insulin or take certain medicines for diabetes mellitus. Hypoglycemia can be a life-threatening condition. Symptoms of hypoglycemia (sleepiness, dizziness, and disorientation) are similar to symptoms of having too much alcohol.  If your health care provider has given you approval to drink alcohol, do so in moderation and use the following guidelines:  Women should not have more than one drink per day, and men  should not have more than two drinks per day. One drink is equal to:  12 oz of beer.  5 oz of wine.  1 oz of hard liquor.  Do not drink on an empty stomach.  Keep yourself hydrated. Have water, diet soda, or unsweetened iced tea.  Regular soda, juice, and other mixers might contain a lot of carbohydrates and should be counted. WHAT FOODS ARE NOT RECOMMENDED? As you make food choices, it is important to remember that all foods are not the same. Some foods have fewer nutrients per serving than other foods, even though they might have the same number of calories or carbohydrates. It is difficult to get your body what it needs when you eat foods with fewer nutrients. Examples of foods that you should avoid that are high in calories and carbohydrates but low in nutrients include:  Trans fats (most processed foods list trans fats on the Nutrition Facts label).  Regular soda.  Juice.  Candy.  Sweets, such as cake, pie, doughnuts, and cookies.  Fried foods. WHAT FOODS CAN I EAT? Eat nutrient-rich foods, which will nourish your body and keep you healthy. The food you should eat also will depend on several factors, including:  The calories you need.  The medicines you take.  Your weight.  Your blood glucose level.  Your blood pressure level.  Your cholesterol level. You should eat a variety of foods, including:  Protein.  Lean cuts of meat.  Proteins low in saturated fats, such as fish, egg whites, and beans. Avoid processed meats.  Fruits and vegetables.  Fruits and vegetables that may help control blood glucose levels, such as apples, mangoes, and   yams.  Dairy products.  Choose fat-free or low-fat dairy products, such as milk, yogurt, and cheese.  Grains, bread, pasta, and rice.  Choose whole grain products, such as multigrain bread, whole oats, and brown rice. These foods may help control blood pressure.  Fats.  Foods containing healthful fats, such as nuts,  avocado, olive oil, canola oil, and fish. DOES EVERYONE WITH DIABETES MELLITUS HAVE THE SAME MEAL PLAN? Because every person with diabetes mellitus is different, there is not one meal plan that works for everyone. It is very important that you meet with a dietitian who will help you create a meal plan that is just right for you.   This information is not intended to replace advice given to you by your health care provider. Make sure you discuss any questions you have with your health care provider.   Document Released: 09/30/2004 Document Revised: 01/24/2014 Document Reviewed: 11/30/2012 Elsevier Interactive Patient Education 2016 [employer]  

## 2015-10-12 ENCOUNTER — Other Ambulatory Visit: Payer: Self-pay | Admitting: Pharmacist

## 2015-10-12 MED ORDER — GLUCOSE BLOOD VI STRP: ORAL_STRIP | 12 refills | Status: DC

## 2015-10-12 MED FILL — TRUE METRIX GLUCOSE TEST ST: 25 days supply | Qty: 100 | Fill #0

## 2015-10-26 ENCOUNTER — Ambulatory Visit: Payer: Self-pay | Admitting: Family Medicine

## 2015-11-04 ENCOUNTER — Encounter: Payer: Self-pay | Admitting: Family Medicine

## 2015-11-04 ENCOUNTER — Ambulatory Visit: Payer: Self-pay | Attending: Family Medicine | Admitting: Family Medicine

## 2015-11-04 VITALS — BP 163/90 | HR 79 | Temp 98.5°F | Ht 66.0 in | Wt 184.2 lb

## 2015-11-04 DIAGNOSIS — E0810 Diabetes mellitus due to underlying condition with ketoacidosis without coma: Secondary | ICD-10-CM

## 2015-11-04 DIAGNOSIS — E785 Hyperlipidemia, unspecified: Secondary | ICD-10-CM | POA: Insufficient documentation

## 2015-11-04 DIAGNOSIS — Z885 Allergy status to narcotic agent status: Secondary | ICD-10-CM | POA: Insufficient documentation

## 2015-11-04 DIAGNOSIS — E119 Type 2 diabetes mellitus without complications: Secondary | ICD-10-CM

## 2015-11-04 DIAGNOSIS — Z7902 Long term (current) use of antithrombotics/antiplatelets: Secondary | ICD-10-CM | POA: Insufficient documentation

## 2015-11-04 DIAGNOSIS — I10 Essential (primary) hypertension: Secondary | ICD-10-CM | POA: Insufficient documentation

## 2015-11-04 DIAGNOSIS — E118 Type 2 diabetes mellitus with unspecified complications: Secondary | ICD-10-CM | POA: Insufficient documentation

## 2015-11-04 DIAGNOSIS — Z880 Allergy status to penicillin: Secondary | ICD-10-CM | POA: Insufficient documentation

## 2015-11-04 DIAGNOSIS — Z794 Long term (current) use of insulin: Secondary | ICD-10-CM | POA: Insufficient documentation

## 2015-11-04 DIAGNOSIS — E7800 Pure hypercholesterolemia, unspecified: Secondary | ICD-10-CM | POA: Insufficient documentation

## 2015-11-04 DIAGNOSIS — E7849 Other hyperlipidemia: Secondary | ICD-10-CM

## 2015-11-04 DIAGNOSIS — Z7982 Long term (current) use of aspirin: Secondary | ICD-10-CM | POA: Insufficient documentation

## 2015-11-04 DIAGNOSIS — E784 Other hyperlipidemia: Secondary | ICD-10-CM

## 2015-11-04 DIAGNOSIS — R0982 Postnasal drip: Secondary | ICD-10-CM | POA: Insufficient documentation

## 2015-11-04 DIAGNOSIS — D638 Anemia in other chronic diseases classified elsewhere: Secondary | ICD-10-CM | POA: Insufficient documentation

## 2015-11-04 DIAGNOSIS — Z79899 Other long term (current) drug therapy: Secondary | ICD-10-CM | POA: Insufficient documentation

## 2015-11-04 DIAGNOSIS — H3411 Central retinal artery occlusion, right eye: Secondary | ICD-10-CM | POA: Insufficient documentation

## 2015-11-04 LAB — CBG MONITORING, ED

## 2015-11-04 MED ORDER — CETIRIZINE HCL 10 MG PO TABS: 10.0000 mg | ORAL_TABLET | Freq: Every day | ORAL | 3 refills | Status: DC

## 2015-11-04 MED FILL — BRIMONIDINE TARTRATE 0.15%: 0.15 | 15 days supply | Qty: 5 | Fill #0

## 2015-11-04 NOTE — Patient Instructions (Signed)
Diabetes Mellitus and Food It is important for you to manage your blood sugar (glucose) level. Your blood glucose level can be greatly affected by what you eat. Eating healthier foods in the appropriate amounts throughout the day at about the same time each day will help you control your blood glucose level. It can also help slow or prevent worsening of your diabetes mellitus. Healthy eating may even help you improve the level of your blood pressure and reach or maintain a healthy weight.  General recommendations for healthful eating and cooking habits include:  Eating meals and snacks regularly. Avoid going long periods of time without eating to lose weight.  Eating a diet that consists mainly of plant-based foods, such as fruits, vegetables, nuts, legumes, and whole grains.  Using low-heat cooking methods, such as baking, instead of high-heat cooking methods, such as deep frying. Work with your dietitian to make sure you understand how to use the Nutrition Facts information on food labels. HOW CAN FOOD AFFECT ME? Carbohydrates Carbohydrates affect your blood glucose level more than any other type of food. Your dietitian will help you determine how many carbohydrates to eat at each meal and teach you how to count carbohydrates. Counting carbohydrates is important to keep your blood glucose at a healthy level, especially if you are using insulin or taking certain medicines for diabetes mellitus. Alcohol Alcohol can cause sudden decreases in blood glucose (hypoglycemia), especially if you use insulin or take certain medicines for diabetes mellitus. Hypoglycemia can be a life-threatening condition. Symptoms of hypoglycemia (sleepiness, dizziness, and disorientation) are similar to symptoms of having too much alcohol.  If your health care provider has given you approval to drink alcohol, do so in moderation and use the following guidelines:  Women should not have more than one drink per day, and men  should not have more than two drinks per day. One drink is equal to:  12 oz of beer.  5 oz of wine.  1 oz of hard liquor.  Do not drink on an empty stomach.  Keep yourself hydrated. Have water, diet soda, or unsweetened iced tea.  Regular soda, juice, and other mixers might contain a lot of carbohydrates and should be counted. WHAT FOODS ARE NOT RECOMMENDED? As you make food choices, it is important to remember that all foods are not the same. Some foods have fewer nutrients per serving than other foods, even though they might have the same number of calories or carbohydrates. It is difficult to get your body what it needs when you eat foods with fewer nutrients. Examples of foods that you should avoid that are high in calories and carbohydrates but low in nutrients include:  Trans fats (most processed foods list trans fats on the Nutrition Facts label).  Regular soda.  Juice.  Candy.  Sweets, such as cake, pie, doughnuts, and cookies.  Fried foods. WHAT FOODS CAN I EAT? Eat nutrient-rich foods, which will nourish your body and keep you healthy. The food you should eat also will depend on several factors, including:  The calories you need.  The medicines you take.  Your weight.  Your blood glucose level.  Your blood pressure level.  Your cholesterol level. You should eat a variety of foods, including:  Protein.  Lean cuts of meat.  Proteins low in saturated fats, such as fish, egg whites, and beans. Avoid processed meats.  Fruits and vegetables.  Fruits and vegetables that may help control blood glucose levels, such as apples, mangoes, and   yams.  Dairy products.  Choose fat-free or low-fat dairy products, such as milk, yogurt, and cheese.  Grains, bread, pasta, and rice.  Choose whole grain products, such as multigrain bread, whole oats, and brown rice. These foods may help control blood pressure.  Fats.  Foods containing healthful fats, such as nuts,  avocado, olive oil, canola oil, and fish. DOES EVERYONE WITH DIABETES MELLITUS HAVE THE SAME MEAL PLAN? Because every person with diabetes mellitus is different, there is not one meal plan that works for everyone. It is very important that you meet with a dietitian who will help you create a meal plan that is just right for you.   This information is not intended to replace advice given to you by your health care provider. Make sure you discuss any questions you have with your health care provider.   Document Released: 09/30/2004 Document Revised: 01/24/2014 Document Reviewed: 11/30/2012 Elsevier Interactive Patient Education 2016 [employer]  

## 2015-11-04 NOTE — Progress Notes (Signed)
Medication refills- eye drops

## 2015-11-04 NOTE — Progress Notes (Signed)
Subjective:    Patient ID: Adrienne [name], female    DOB: [birth year], 60 y.o.   MRN: [id number]  HPI Adrienne [name] is a 60 year old female with a history of uncontrolled type 2 diabetes mellitus (A1c 15.0), anemia, central retinal artery occlusion, hypertension who comes into the clinic for a follow-up visit.  She has been compliant with 30 units of Lantus and denies hypoglycemia. Blood sugar log reveals fasting sugars ranging from 80-130 with a single value of 44 and random sugars in the 270 -300 range; she attributes random sugars to eating pasta She has been unable to continue with her regular primary care doctor due to loss of insurance.  Labs from last visit revealed anemia for which she was referred to GI for colonoscopy and is currently awaiting an appointment.  Denies shortness of breath, chest pains or pedal edema at this time but tells me that in the past she was on Lasix for pedal edema.  2-D echo from 11/2013 revealed EF of 45-50%, diffuse hypokinesis; she is not currently followed by cardiology.  Complains of postnasal drip and cough. But no sinus tenderness or fever.  Past Medical History:  Diagnosis Date  . Diabetes ([place])   . High cholesterol   . Hypertension     Past Surgical History:  Procedure Laterality Date  . CATARACT EXTRACTION, BILATERAL     Laser eye surgery, cataract surgery bi-lat  . CESAREAN SECTION     Twins, Breech  . EYE SURGERY Bilateral 2012   LASER, CATARACT  . TEE WITHOUT CARDIOVERSION N/A 12/31/2013   Procedure: TRANSESOPHAGEAL ECHOCARDIOGRAM (TEE);  Surgeon: Laverda Page, MD;  Location: [place];  Service: Cardiovascular;  Laterality: N/A;  H&P in file  . TUBAL LIGATION Bilateral     Allergies  Allergen Reactions  . Codeine Itching  . Hydrocodone Itching  . Niacin And Related Itching  . Oxycodone Itching  . Penicillin G Itching    Current Outpatient Prescriptions on File Prior to Visit  Medication Sig Dispense Refill   . aspirin EC 81 MG tablet Take 81 mg by mouth daily.     Marland Kitchen atorvastatin (LIPITOR) 40 MG tablet Take 1 tablet (40 mg total) by mouth daily. 30 tablet 5  . blood glucose meter kit and supplies Dispense based on patient and insurance preference. Use up to four times daily as directed. (FOR ICD-9 250.00, 250.01). 1 each 0  . brimonidine (ALPHAGAN) 0.15 % ophthalmic solution Place 1 drop into both eyes 2 (two) times daily.     . clopidogrel (PLAVIX) 75 MG tablet Take 1 tablet (75 mg total) by mouth daily. 30 tablet 5  . dorzolamide-timolol (COSOPT) 22.3-6.8 MG/ML ophthalmic solution Place 1 drop into both eyes 2 (two) times daily.   2  . glucose blood (TRUE METRIX BLOOD GLUCOSE TEST) test strip Use as instructed 100 each 12  . insulin aspart (NOVOLOG) 100 UNIT/ML injection Inject 8 Units into the skin 3 (three) times daily with meals. 10 mL 5  . Insulin Glargine (LANTUS) 100 UNIT/ML Solostar Pen Inject 30 Units into the skin daily at 10 pm. 15 mL 5  . Insulin Pen Needle 31G X 5 MM MISC 1 Act by Does not apply route 4 (four) times daily - after meals and at bedtime. 300 each 0  . metFORMIN (GLUCOPHAGE) 1000 MG tablet Take 1 tablet (1,000 mg total) by mouth 2 (two) times daily. 60 tablet 5  . ALPRAZolam (XANAX) 0.5 MG tablet Take 1 tablet (0.5 mg total)  by mouth as needed for anxiety. (Patient not taking: Reported on 11/04/2015) 3 tablet 0   No current facility-administered medications on file prior to visit.      Review of Systems  Constitutional: Negative for activity change, appetite change and fatigue.  HENT: Positive for postnasal drip. Negative for congestion, sinus pressure and sore throat.   Eyes: Negative for visual disturbance.  Respiratory: Positive for cough. Negative for chest tightness, shortness of breath and wheezing.   Cardiovascular: Negative for chest pain and palpitations.  Gastrointestinal: Negative for abdominal distention, abdominal pain and constipation.  Endocrine:  Negative for polydipsia.  Genitourinary: Negative for dysuria and frequency.  Musculoskeletal: Negative for arthralgias and back pain.  Skin: Negative for rash.  Neurological: Negative for tremors, light-headedness and numbness.  Hematological: Does not bruise/bleed easily.  Psychiatric/Behavioral: Negative for agitation and behavioral problems.       Objective: Vitals:   11/04/15 1526  BP: (!) 163/90  Pulse: 79  Temp: 98.5 F (36.9 C)  TempSrc: Oral  SpO2: 100%  Weight: 184 lb 3.2 oz (83.6 kg)  Height: _0  (1.676 m)      Physical Exam  Constitutional: She is oriented to person, place, and time. She appears well-developed and well-nourished.  Cardiovascular: Normal rate, normal heart sounds and intact distal pulses.   No murmur heard. Pulmonary/Chest: Effort normal and breath sounds normal. She has no wheezes. She has no rales. She exhibits no tenderness.  Abdominal: Soft. Bowel sounds are normal. She exhibits no distension and no mass. There is no tenderness.  Musculoskeletal: Normal range of motion.  Neurological: She is alert and oriented to person, place, and time.          Assessment & Plan:  1. Type 2 diabetes mellitus with other specified complication (HCC) P9E of 15.0 Blood sugar log reveals some improvement Continue current regimen - Glucose (CBG) - insulin aspart (NOVOLOG) 100 UNIT/ML injection; Inject 8 Units into the skin 3 (three) times daily with meals.  Dispense: 10 mL; Refill: 5 - Insulin Glargine (LANTUS) 100 UNIT/ML Solostar Pen; Inject 30 Units into the skin daily at 10 pm.  Dispense: 15 mL; Refill: 5 - metFORMIN (GLUCOPHAGE) 1000 MG tablet; Take 1 tablet (1,000 mg total) by mouth 2 (two) times daily.  Dispense: 60 tablet; Refill: 5  2. CRAO (central retinal artery occlusion), right - clopidogrel (PLAVIX) 75 MG tablet; Take 1 tablet (75 mg total) by mouth daily.  Dispense: 30 tablet; Refill: 5  3. HLD (hyperlipidemia) Remains on a statin. -  atorvastatin (LIPITOR) 40 MG tablet; Take 1 tablet (40 mg total) by mouth daily.  Dispense: 30 tablet; Refill: 5  4. Anemia due to other cause Referred for colonoscopy as part of the anemia workup   5. Postnasal drip Placed on antihistamine If cough persists we'll place back on Lasix.  6. Hypertension Uncontrolled BP Patient attributes this to being upset prior to her coming here.

## 2015-11-06 ENCOUNTER — Telehealth: Payer: Self-pay | Admitting: Family Medicine

## 2015-11-06 MED FILL — LANTUS SOLOSTAR 100 UNITS/M: 100 | 28 days supply | Qty: 9 | Fill #1

## 2015-11-06 MED FILL — ATORVASTATIN 40 MG TABLET: 40 | 30 days supply | Qty: 30 | Fill #1

## 2015-11-06 NOTE — Telephone Encounter (Signed)
Adrienne [name] from [facility] called requesting additional orders for skilled nursing.  Please f/u

## 2015-11-18 NOTE — Telephone Encounter (Signed)
Writer has called Margaretha Sheffield from [facility] several times and have LVM as Probation officer did today.  Writer aksed her to call back with the additional requests she is looking for.

## 2015-11-26 MED FILL — TRUE METRIX GLUCOSE TEST ST: 25 days supply | Qty: 100 | Fill #1

## 2015-11-26 MED FILL — CLOPIDOGREL 75 MG TABLET: 75 | 30 days supply | Qty: 30 | Fill #1

## 2015-11-26 MED FILL — LANTUS SOLOSTAR 100 UNITS/M: 100 | 28 days supply | Qty: 9 | Fill #2

## 2015-12-02 MED FILL — ATORVASTATIN 40 MG TABLET: 40 | 30 days supply | Qty: 30 | Fill #2

## 2015-12-03 ENCOUNTER — Encounter: Payer: Self-pay | Admitting: Physician Assistant

## 2015-12-15 ENCOUNTER — Ambulatory Visit: Payer: Self-pay | Attending: Family Medicine | Admitting: Family Medicine

## 2015-12-15 ENCOUNTER — Encounter: Payer: Self-pay | Admitting: Family Medicine

## 2015-12-15 VITALS — BP 140/80 | HR 82 | Temp 98.2°F | Ht 66.0 in | Wt 186.0 lb

## 2015-12-15 DIAGNOSIS — E7800 Pure hypercholesterolemia, unspecified: Secondary | ICD-10-CM | POA: Insufficient documentation

## 2015-12-15 DIAGNOSIS — Z7902 Long term (current) use of antithrombotics/antiplatelets: Secondary | ICD-10-CM | POA: Insufficient documentation

## 2015-12-15 DIAGNOSIS — Z880 Allergy status to penicillin: Secondary | ICD-10-CM | POA: Insufficient documentation

## 2015-12-15 DIAGNOSIS — E1169 Type 2 diabetes mellitus with other specified complication: Secondary | ICD-10-CM

## 2015-12-15 DIAGNOSIS — Z1239 Encounter for other screening for malignant neoplasm of breast: Secondary | ICD-10-CM

## 2015-12-15 DIAGNOSIS — Z885 Allergy status to narcotic agent status: Secondary | ICD-10-CM | POA: Insufficient documentation

## 2015-12-15 DIAGNOSIS — I10 Essential (primary) hypertension: Secondary | ICD-10-CM | POA: Insufficient documentation

## 2015-12-15 DIAGNOSIS — Z7982 Long term (current) use of aspirin: Secondary | ICD-10-CM | POA: Insufficient documentation

## 2015-12-15 DIAGNOSIS — Z79899 Other long term (current) drug therapy: Secondary | ICD-10-CM | POA: Insufficient documentation

## 2015-12-15 DIAGNOSIS — H3411 Central retinal artery occlusion, right eye: Secondary | ICD-10-CM | POA: Insufficient documentation

## 2015-12-15 DIAGNOSIS — Z0000 Encounter for general adult medical examination without abnormal findings: Secondary | ICD-10-CM | POA: Insufficient documentation

## 2015-12-15 DIAGNOSIS — Z1231 Encounter for screening mammogram for malignant neoplasm of breast: Secondary | ICD-10-CM

## 2015-12-15 DIAGNOSIS — Z124 Encounter for screening for malignant neoplasm of cervix: Secondary | ICD-10-CM

## 2015-12-15 DIAGNOSIS — E1100 Type 2 diabetes mellitus with hyperosmolarity without nonketotic hyperglycemic-hyperosmolar coma (NKHHC): Secondary | ICD-10-CM | POA: Insufficient documentation

## 2015-12-15 DIAGNOSIS — Z23 Encounter for immunization: Secondary | ICD-10-CM

## 2015-12-15 DIAGNOSIS — Z794 Long term (current) use of insulin: Secondary | ICD-10-CM | POA: Insufficient documentation

## 2015-12-15 LAB — GLUCOSE, POCT (MANUAL RESULT ENTRY): POC GLUCOSE: 219 mg/dL — AB (ref 70–99)

## 2015-12-15 LAB — POCT GLYCOSYLATED HEMOGLOBIN (HGB A1C): HEMOGLOBIN A1C: 7.9

## 2015-12-15 MED ORDER — INSULIN ASPART 100 UNIT/ML ~~LOC~~ SOLN
8.0000 [IU] | Freq: Three times a day (TID) | SUBCUTANEOUS | 1 refills | Status: DC
Start: 2015-12-15 — End: 2016-05-04

## 2015-12-15 MED ORDER — ATORVASTATIN CALCIUM 40 MG PO TABS: 40.0000 mg | ORAL_TABLET | Freq: Every day | ORAL | 1 refills | Status: DC

## 2015-12-15 MED ORDER — CLOPIDOGREL BISULFATE 75 MG PO TABS: 75.0000 mg | ORAL_TABLET | Freq: Every day | ORAL | 1 refills | Status: DC

## 2015-12-15 MED ORDER — INSULIN GLARGINE 100 UNIT/ML SOLOSTAR PEN: 30.0000 [IU] | PEN_INJECTOR | Freq: Every day | SUBCUTANEOUS | 1 refills | Status: DC

## 2015-12-15 MED ORDER — METFORMIN HCL 1000 MG PO TABS: 1000.0000 mg | ORAL_TABLET | Freq: Two times a day (BID) | ORAL | 1 refills | Status: DC

## 2015-12-15 NOTE — Progress Notes (Signed)
Subjective:  Patient ID: Adrienne [name], female    DOB: 1955-09-11  Age: 60 y.o. MRN: [id number]  CC: Annual Exam   HPI Adrienne [name] presents for An annual physical exam. She also has a disability form to be completed which have informed her I will be completed at her next office visit. She informs me hydrocodone will also need an FMLA form completed so she can bring the patient for doctor's appointments.  Past Medical History:  Diagnosis Date  . Diabetes ([place])   . High cholesterol   . Hypertension     Past Surgical History:  Procedure Laterality Date  . CATARACT EXTRACTION, BILATERAL     Laser eye surgery, cataract surgery bi-lat  . CESAREAN SECTION     Twins, [place]  . EYE SURGERY Bilateral 2012   LASER, CATARACT  . TEE WITHOUT CARDIOVERSION N/A 12/31/2013   Procedure: TRANSESOPHAGEAL ECHOCARDIOGRAM (TEE);  Surgeon: Laverda Page, MD;  Location: [facility];  Service: Cardiovascular;  Laterality: N/A;  H&P in file  . TUBAL LIGATION Bilateral     Allergies  Allergen Reactions  . Codeine Itching  . Hydrocodone Itching  . Niacin And Related Itching  . Oxycodone Itching  . Penicillin G Itching      Outpatient Medications Prior to Visit  Medication Sig Dispense Refill  . aspirin EC 81 MG tablet Take 81 mg by mouth daily.     . blood glucose meter kit and supplies Dispense based on patient and insurance preference. Use up to four times daily as directed. (FOR ICD-9 250.00, 250.01). 1 each 0  . brimonidine (ALPHAGAN) 0.15 % ophthalmic solution Place 1 drop into both eyes 2 (two) times daily.     . dorzolamide-timolol (COSOPT) 22.3-6.8 MG/ML ophthalmic solution Place 1 drop into both eyes 2 (two) times daily.   2  . glucose blood (TRUE METRIX BLOOD GLUCOSE TEST) test strip Use as instructed 100 each 12  . atorvastatin (LIPITOR) 40 MG tablet Take 1 tablet (40 mg total) by mouth daily. 30 tablet 5  . clopidogrel (PLAVIX) 75 MG tablet Take 1 tablet (75 mg total)  by mouth daily. 30 tablet 5  . insulin aspart (NOVOLOG) 100 UNIT/ML injection Inject 8 Units into the skin 3 (three) times daily with meals. 10 mL 5  . Insulin Glargine (LANTUS) 100 UNIT/ML Solostar Pen Inject 30 Units into the skin daily at 10 pm. 15 mL 5  . metFORMIN (GLUCOPHAGE) 1000 MG tablet Take 1 tablet (1,000 mg total) by mouth 2 (two) times daily. 60 tablet 5  . cetirizine (ZYRTEC) 10 MG tablet Take 1 tablet (10 mg total) by mouth daily. (Patient not taking: Reported on 12/15/2015) 30 tablet 3  . Insulin Pen Needle 31G X 5 MM MISC 1 Act by Does not apply route 4 (four) times daily - after meals and at bedtime. 300 each 0  . ALPRAZolam (XANAX) 0.5 MG tablet Take 1 tablet (0.5 mg total) by mouth as needed for anxiety. (Patient not taking: Reported on 12/15/2015) 3 tablet 0   No facility-administered medications prior to visit.     ROS Review of Systems  Constitutional: Negative for activity change, appetite change and fatigue.  HENT: Negative for congestion, sinus pressure and sore throat.   Eyes: Negative for visual disturbance.  Respiratory: Negative for cough, chest tightness, shortness of breath and wheezing.   Cardiovascular: Negative for chest pain and palpitations.  Gastrointestinal: Negative for abdominal distention, abdominal pain and constipation.  Endocrine: Negative for polydipsia.  Genitourinary: Negative for dysuria and frequency.  Musculoskeletal: Negative for arthralgias and back pain.  Skin: Negative for rash.  Neurological: Negative for tremors, light-headedness and numbness.  Hematological: Does not bruise/bleed easily.  Psychiatric/Behavioral: Negative for agitation and behavioral problems.    Objective:  BP 140/80 (BP Location: Right Arm, Patient Position: Sitting, Cuff Size: Normal)   Pulse 82   Temp 98.2 F (36.8 C) (Oral)   Ht _0  (1.676 m)   Wt 186 lb (84.4 kg)   SpO2 100%   BMI 30.02 kg/m   BP/Weight 12/15/2015 11/04/2015 6/56/8127  Systolic  BP 517 001 749  Diastolic BP 80 90 76  Wt. (Lbs) 186 184.2 180.2  BMI 30.02 29.73 29.09      Physical Exam  Constitutional: She is oriented to person, place, and time. She appears well-developed and well-nourished. No distress.  HENT:  Head: Normocephalic.  Right Ear: External ear normal.  Left Ear: External ear normal.  Nose: Nose normal.  Mouth/Throat: Oropharynx is clear and moist.  Eyes: Conjunctivae and EOM are normal. Pupils are equal, round, and reactive to light.  Neck: Normal range of motion. No JVD present.  Cardiovascular: Normal rate, regular rhythm, normal heart sounds and intact distal pulses.  Exam reveals no gallop.   No murmur heard. Pulmonary/Chest: Effort normal and breath sounds normal. No respiratory distress. She has no wheezes. She has no rales. She exhibits no tenderness. Right breast exhibits no mass and no tenderness. Left breast exhibits no mass and no tenderness.  Abdominal: Soft. Bowel sounds are normal. She exhibits no distension and no mass. There is no tenderness.  Genitourinary:  Genitourinary Comments: Normal external genitalia Normal vagina, normal cervix  Musculoskeletal: Normal range of motion. She exhibits no edema or tenderness.  Neurological: She is alert and oriented to person, place, and time. She has normal reflexes.  Skin: Skin is warm and dry. She is not diaphoretic.  Psychiatric: She has a normal mood and affect.    Lab Results  Component Value Date   HGBA1C 7.9 12/15/2015    Assessment & Plan:   1. Type 2 diabetes mellitus with hyperosmolarity without coma, without long-term current use of insulin (HCC) A1c of 7.9 which has Improved significantly from A1c of 15 Microalbumin and comprehensive metabolic panel at next visit. - Glucose (CBG) - HgB A1c  2. Encounter for annual physical exam Patient is working with [facility] for a colonoscopy appointment. She has to obtain her previous colonoscopy report prior to being seen by  GI.  3. Screening for breast cancer - MM DIGITAL SCREENING BILATERAL; Future  4. Screening for malignant neoplasm of cervix - Cytology - PAP [place]  5. Need for Tdap vaccination  6. Type 2 diabetes mellitus with other specified complication, with long-term current use of insulin (HCC) - metFORMIN (GLUCOPHAGE) 1000 MG tablet; Take 1 tablet (1,000 mg total) by mouth 2 (two) times daily.  Dispense: 180 tablet; Refill: 1 - Insulin Glargine (LANTUS) 100 UNIT/ML Solostar Pen; Inject 30 Units into the skin daily at 10 pm.  Dispense: 45 mL; Refill: 1 - insulin aspart (NOVOLOG) 100 UNIT/ML injection; Inject 8 Units into the skin 3 (three) times daily with meals.  Dispense: 30 mL; Refill: 1  7. CRAO (central retinal artery occlusion), right Scheduled to see ophthalmology at [facility] - clopidogrel (PLAVIX) 75 MG tablet; Take 1 tablet (75 mg total) by mouth daily.  Dispense: 90 tablet; Refill: 1  8. Pure hypercholesterolemia Lipid panel at next visit -  atorvastatin (LIPITOR) 40 MG tablet; Take 1 tablet (40 mg total) by mouth daily.  Dispense: 90 tablet; Refill: 1   Meds ordered this encounter  Medications  . metFORMIN (GLUCOPHAGE) 1000 MG tablet    Sig: Take 1 tablet (1,000 mg total) by mouth 2 (two) times daily.    Dispense:  180 tablet    Refill:  1  . Insulin Glargine (LANTUS) 100 UNIT/ML Solostar Pen    Sig: Inject 30 Units into the skin daily at 10 pm.    Dispense:  45 mL    Refill:  1  . insulin aspart (NOVOLOG) 100 UNIT/ML injection    Sig: Inject 8 Units into the skin 3 (three) times daily with meals.    Dispense:  30 mL    Refill:  1  . clopidogrel (PLAVIX) 75 MG tablet    Sig: Take 1 tablet (75 mg total) by mouth daily.    Dispense:  90 tablet    Refill:  1  . atorvastatin (LIPITOR) 40 MG tablet    Sig: Take 1 tablet (40 mg total) by mouth daily.    Dispense:  90 tablet    Refill:  1    Follow-up: Return in 2 weeks (on 12/29/2015) for Completion of disability paperwork.    Arnoldo Morale MD

## 2015-12-15 NOTE — Progress Notes (Signed)
Pt requesting every 3 month prescription instead of every month.

## 2015-12-15 NOTE — Patient Instructions (Signed)

## 2015-12-16 LAB — CYTOLOGY - PAP
DIAGNOSIS: NEGATIVE
HPV: NOT DETECTED

## 2015-12-24 NOTE — Progress Notes (Signed)
After many attempts to call patient with lab results and letter was mailed out to her today.

## 2015-12-25 MED FILL — !LANTUS SOLOSTAR 100UNITS/M: 100 | 28 days supply | Qty: 9 | Fill #3

## 2015-12-25 MED FILL — ATORVASTATIN 40 MG TABLET: 40 | 90 days supply | Qty: 90 | Fill #0

## 2015-12-25 MED FILL — CLOPIDOGREL 75 MG TABLET: 75 | 90 days supply | Qty: 90 | Fill #0

## 2015-12-25 MED FILL — metFORMIN HCL 1000 MG TABS: 1000 | 90 days supply | Qty: 180 | Fill #0

## 2016-01-04 ENCOUNTER — Ambulatory Visit: Payer: Self-pay | Attending: Family Medicine | Admitting: Family Medicine

## 2016-01-04 ENCOUNTER — Encounter: Payer: Self-pay | Admitting: Family Medicine

## 2016-01-04 VITALS — BP 157/70 | HR 81 | Temp 98.0°F | Ht 66.0 in | Wt 167.6 lb

## 2016-01-04 DIAGNOSIS — E1100 Type 2 diabetes mellitus with hyperosmolarity without nonketotic hyperglycemic-hyperosmolar coma (NKHHC): Secondary | ICD-10-CM | POA: Insufficient documentation

## 2016-01-04 DIAGNOSIS — H5711 Ocular pain, right eye: Secondary | ICD-10-CM | POA: Insufficient documentation

## 2016-01-04 DIAGNOSIS — Z79899 Other long term (current) drug therapy: Secondary | ICD-10-CM | POA: Insufficient documentation

## 2016-01-04 DIAGNOSIS — Z794 Long term (current) use of insulin: Secondary | ICD-10-CM | POA: Insufficient documentation

## 2016-01-04 DIAGNOSIS — Z7982 Long term (current) use of aspirin: Secondary | ICD-10-CM | POA: Insufficient documentation

## 2016-01-04 DIAGNOSIS — H543 Unqualified visual loss, both eyes: Secondary | ICD-10-CM | POA: Insufficient documentation

## 2016-01-04 DIAGNOSIS — H3411 Central retinal artery occlusion, right eye: Secondary | ICD-10-CM | POA: Insufficient documentation

## 2016-01-04 DIAGNOSIS — Z0289 Encounter for other administrative examinations: Secondary | ICD-10-CM | POA: Insufficient documentation

## 2016-01-04 DIAGNOSIS — Z1159 Encounter for screening for other viral diseases: Secondary | ICD-10-CM | POA: Insufficient documentation

## 2016-01-04 DIAGNOSIS — I10 Essential (primary) hypertension: Secondary | ICD-10-CM | POA: Insufficient documentation

## 2016-01-04 DIAGNOSIS — Z029 Encounter for administrative examinations, unspecified: Secondary | ICD-10-CM

## 2016-01-04 LAB — COMPLETE METABOLIC PANEL WITH GFR
ALT: 13 U/L (ref 6–29)
AST: 15 U/L (ref 10–35)
Albumin: 4.5 g/dL (ref 3.6–5.1)
Alkaline Phosphatase: 89 U/L (ref 33–130)
BUN: 11 mg/dL (ref 7–25)
CALCIUM: 9.4 mg/dL (ref 8.6–10.4)
CHLORIDE: 104 mmol/L (ref 98–110)
CO2: 27 mmol/L (ref 20–31)
Creat: 0.85 mg/dL (ref 0.50–0.99)
GFR, EST NON AFRICAN AMERICAN: 74 mL/min (ref >=60)
GFR, Est African American: 86 mL/min (ref >=60)
GLUCOSE: 170 mg/dL — AB (ref 65–99)
POTASSIUM: 4.1 mmol/L (ref 3.5–5.3)
SODIUM: 139 mmol/L (ref 135–146)
Total Bilirubin: 0.3 mg/dL (ref 0.2–1.2)
Total Protein: 7.2 g/dL (ref 6.1–8.1)

## 2016-01-04 LAB — LIPID PANEL
CHOL/HDL RATIO: 2.7 ratio (ref <5.0)
CHOLESTEROL: 144 mg/dL (ref <200)
HDL: 53 mg/dL (ref >50)
LDL Cholesterol: 79 mg/dL (ref <100)
Triglycerides: 58 mg/dL (ref <150)
VLDL: 12 mg/dL (ref <30)

## 2016-01-04 LAB — GLUCOSE, POCT (MANUAL RESULT ENTRY): POC GLUCOSE: 168 mg/dL — AB (ref 70–99)

## 2016-01-04 LAB — HEPATITIS C ANTIBODY: HCV Ab: NEGATIVE

## 2016-01-04 NOTE — Patient Instructions (Signed)
Hypertension Hypertension, commonly called high blood pressure, is when the force of blood pumping through your arteries is too strong. Your arteries are the blood vessels that carry blood from your heart throughout your body. A blood pressure reading consists of a higher number over a lower number, such as 110/72. The higher number (systolic) is the pressure inside your arteries when your heart pumps. The lower number (diastolic) is the pressure inside your arteries when your heart relaxes. Ideally you want your blood pressure below 120/80. Hypertension forces your heart to work harder to pump blood. Your arteries may become narrow or stiff. Having untreated or uncontrolled hypertension can cause heart attack, stroke, kidney disease, and other problems. What increases the risk? Some risk factors for high blood pressure are controllable. Others are not. Risk factors you cannot control include:  Race. You may be at higher risk if you are African American.  Age. Risk increases with age.  Gender. Men are at higher risk than women before age 45 years. After age 65, women are at higher risk than men. Risk factors you can control include:  Not getting enough exercise or physical activity.  Being overweight.  Getting too much fat, sugar, calories, or salt in your diet.  Drinking too much alcohol. What are the signs or symptoms? Hypertension does not usually cause signs or symptoms. Extremely high blood pressure (hypertensive crisis) may cause headache, anxiety, shortness of breath, and nosebleed. How is this diagnosed? To check if you have hypertension, your health care provider will measure your blood pressure while you are seated, with your arm held at the level of your heart. It should be measured at least twice using the same arm. Certain conditions can cause a difference in blood pressure between your right and left arms. A blood pressure reading that is higher than normal on one occasion does  not mean that you need treatment. If it is not clear whether you have high blood pressure, you may be asked to return on a different day to have your blood pressure checked again. Or, you may be asked to monitor your blood pressure at home for 1 or more weeks. How is this treated? Treating high blood pressure includes making lifestyle changes and possibly taking medicine. Living a healthy lifestyle can help lower high blood pressure. You may need to change some of your habits. Lifestyle changes may include:  Following the DASH diet. This diet is high in fruits, vegetables, and whole grains. It is low in salt, red meat, and added sugars.  Keep your sodium intake below 2,300 mg per day.  Getting at least 30-45 minutes of aerobic exercise at least 4 times per week.  Losing weight if necessary.  Not smoking.  Limiting alcoholic beverages.  Learning ways to reduce stress. Your health care provider may prescribe medicine if lifestyle changes are not enough to get your blood pressure under control, and if one of the following is true:  You are 18-59 years of age and your systolic blood pressure is above 140.  You are 60 years of age or older, and your systolic blood pressure is above 150.  Your diastolic blood pressure is above 90.  You have diabetes, and your systolic blood pressure is over 140 or your diastolic blood pressure is over 90.  You have kidney disease and your blood pressure is above 140/90.  You have heart disease and your blood pressure is above 140/90. Your personal target blood pressure may vary depending on your medical   conditions, your age, and other factors. Follow these instructions at home:  Have your blood pressure rechecked as directed by your health care provider.  Take medicines only as directed by your health care provider. Follow the directions carefully. Blood pressure medicines must be taken as prescribed. The medicine does not work as well when you skip  doses. Skipping doses also puts you at risk for problems.  Do not smoke.  Monitor your blood pressure at home as directed by your health care provider. Contact a health care provider if:  You think you are having a reaction to medicines taken.  You have recurrent headaches or feel dizzy.  You have swelling in your ankles.  You have trouble with your vision. Get help right away if:  You develop a severe headache or confusion.  You have unusual weakness, numbness, or feel faint.  You have severe chest or abdominal pain.  You vomit repeatedly.  You have trouble breathing. This information is not intended to replace advice given to you by your health care provider. Make sure you discuss any questions you have with your health care provider. Document Released: 01/03/2005 Document Revised: 06/11/2015 Document Reviewed: 10/26/2012 Elsevier Interactive Patient Education  2017 Elsevier Inc.  

## 2016-01-04 NOTE — Progress Notes (Signed)
Subjective:  Patient ID: Adrienne [name], female    DOB: 11/09/1955  Age: 60 y.o. MRN: [id number]  CC: Diabetes; paperwork (disability); and Eye Pain (right side)   HPI Adrienne [name] presents for completion of disability paperwork. She is on disability due to right eye blindness and vision in left eye is 20/200. She is followed by Ophthalmology at [facility]; she has intermittent pain in her right eye. Due her visual impairment she does not drive.  Her blood pressure is elevated but she has been compliant with her antihypertensive.  Compliant with her Diabetic medications and a diabetic diet.  Past Medical History:  Diagnosis Date  . Diabetes ([place])   . High cholesterol   . Hypertension     Past Surgical History:  Procedure Laterality Date  . CATARACT EXTRACTION, BILATERAL     Laser eye surgery, cataract surgery bi-lat  . CESAREAN SECTION     Twins, [place]  . EYE SURGERY Bilateral 2012   LASER, CATARACT  . TEE WITHOUT CARDIOVERSION N/A 12/31/2013   Procedure: TRANSESOPHAGEAL ECHOCARDIOGRAM (TEE);  Surgeon: Laverda Page, MD;  Location: [facility];  Service: Cardiovascular;  Laterality: N/A;  H&P in file  . TUBAL LIGATION Bilateral     Allergies  Allergen Reactions  . Codeine Itching  . Hydrocodone Itching  . Niacin And Related Itching  . Oxycodone Itching  . Penicillin G Itching     Outpatient Medications Prior to Visit  Medication Sig Dispense Refill  . aspirin EC 81 MG tablet Take 81 mg by mouth daily.     Marland Kitchen atorvastatin (LIPITOR) 40 MG tablet Take 1 tablet (40 mg total) by mouth daily. 90 tablet 1  . blood glucose meter kit and supplies Dispense based on patient and insurance preference. Use up to four times daily as directed. (FOR ICD-9 250.00, 250.01). 1 each 0  . brimonidine (ALPHAGAN) 0.15 % ophthalmic solution Place 1 drop into both eyes 2 (two) times daily.     . clopidogrel (PLAVIX) 75 MG tablet Take 1 tablet (75 mg total) by mouth daily. 90 tablet  1  . dorzolamide-timolol (COSOPT) 22.3-6.8 MG/ML ophthalmic solution Place 1 drop into both eyes 2 (two) times daily.   2  . glucose blood (TRUE METRIX BLOOD GLUCOSE TEST) test strip Use as instructed 100 each 12  . insulin aspart (NOVOLOG) 100 UNIT/ML injection Inject 8 Units into the skin 3 (three) times daily with meals. 30 mL 1  . Insulin Glargine (LANTUS) 100 UNIT/ML Solostar Pen Inject 30 Units into the skin daily at 10 pm. 45 mL 1  . Insulin Pen Needle 31G X 5 MM MISC 1 Act by Does not apply route 4 (four) times daily - after meals and at bedtime. 300 each 0  . metFORMIN (GLUCOPHAGE) 1000 MG tablet Take 1 tablet (1,000 mg total) by mouth 2 (two) times daily. 180 tablet 1  . cetirizine (ZYRTEC) 10 MG tablet Take 1 tablet (10 mg total) by mouth daily. (Patient not taking: Reported on 01/04/2016) 30 tablet 3   No facility-administered medications prior to visit.     ROS Review of Systems  Constitutional: Negative for activity change and appetite change.  HENT: Negative for sinus pressure and sore throat.   Eyes: Positive for pain and visual disturbance.  Respiratory: Negative for chest tightness, shortness of breath and wheezing.   Cardiovascular: Negative for chest pain and palpitations.  Gastrointestinal: Negative for abdominal distention, abdominal pain and constipation.  Genitourinary: Negative.   Musculoskeletal: Negative.  Psychiatric/Behavioral: Negative for behavioral problems and dysphoric mood.    Objective:  BP (!) 157/70 (BP Location: Right Arm, Patient Position: Sitting, Cuff Size: Small)   Pulse 81   Temp 98 F (36.7 C) (Oral)   Ht _0  (1.676 m)   Wt 167 lb 9.6 oz (76 kg)   SpO2 96%   BMI 27.05 kg/m   BP/Weight 01/04/2016 12/15/2015 65/68/1275  Systolic BP 170 017 494  Diastolic BP 70 80 90  Wt. (Lbs) 167.6 186 184.2  BMI 27.05 30.02 29.73      Physical Exam  Constitutional: She is oriented to person, place, and time. She appears well-developed and  well-nourished.  Cardiovascular: Normal rate, normal heart sounds and intact distal pulses.   No murmur heard. Pulmonary/Chest: Effort normal and breath sounds normal. She has no wheezes. She has no rales. She exhibits no tenderness.  Abdominal: Soft. Bowel sounds are normal. She exhibits no distension and no mass. There is no tenderness.  Musculoskeletal: Normal range of motion.  Neurological: She is alert and oriented to person, place, and time.     Lab Results  Component Value Date   HGBA1C 7.9 12/15/2015    Assessment & Plan:   1. Type 2 diabetes mellitus with hyperosmolarity without coma, without long-term current use of insulin (HCC) A1c 7.9 Continue ADA diet - Glucose (CBG) - COMPLETE METABOLIC PANEL WITH GFR - Lipid panel - Microalbumin / creatinine urine ratio  2. 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 Need for hepatitis C screening test - Hepatitis C antibody, reflex  4. Essential hypertension Uncontrolled Low sodium diet  No regimen changes today  5. Encounters for administrative purposes Completed paper work   No orders of the defined types were placed in this encounter.   Follow-up: Return in about 3 months (around 04/03/2016) for Follow-up on diabetes mellitus.   Arnoldo Morale MD

## 2016-01-05 LAB — MICROALBUMIN / CREATININE URINE RATIO
Creatinine, Urine: 255 mg/dL (ref 20–320)
MICROALB UR: 6.3 mg/dL
Microalb Creat Ratio: 25 mcg/mg creat (ref <30)

## 2016-01-06 ENCOUNTER — Telehealth: Payer: Self-pay

## 2016-01-06 NOTE — Telephone Encounter (Signed)
Writer discussed lab results per Dr. Jarold Song.  Patient stated understanding.

## 2016-01-06 NOTE — Telephone Encounter (Signed)
Writer called patient to discuss lab results per Dr. Jarold Song.  LVM requesting patient to return this call.

## 2016-01-06 NOTE — Telephone Encounter (Signed)
-----   Message from Arnoldo Morale, MD sent at 01/05/2016  2:25 PM EST ----- Please inform the patient that labs are normal. Thank you.

## 2016-01-13 MED FILL — BRIMONIDINE TARTRATE 0.15%: 0.15 | 15 days supply | Qty: 5 | Fill #1

## 2016-01-14 ENCOUNTER — Telehealth: Payer: Self-pay | Admitting: Family Medicine

## 2016-01-14 NOTE — Telephone Encounter (Signed)
Writer spoke with patient regarding the request for cosopt and asked her why she wasn't filling it through [facility] since they were the ones that originally prescribed it for her.  Patient states that she did but they made a mistake and sent for two bottles of the alphagan instead of getting one of each.  Writer encouraged patient to call [facility] this afternoon and then update here tomorrow. Writer informed patient that MD was not here this afternoon.  Patient stated understanding.  [

## 2016-01-14 NOTE — Telephone Encounter (Signed)
Pt calling requesting a refill of dorzolamide-timolol (COSOPT) 22.3-6.8 MG/ML ophthalmic solution  Pt was informed that PCP did not originally  prescribe eye drops but pt states these eye drops were discussed with PCP at last visit and that PCP stated she would prescribe medication if needed  Pt requesting Rx be sent to [facility] pharmacy   Pt wanted to inform PCP that she was seen at [facility] on Dec. 4th

## 2016-01-14 NOTE — Telephone Encounter (Signed)
Patient called wanting to know status of medication request

## 2016-01-15 ENCOUNTER — Telehealth: Payer: Self-pay | Admitting: Family Medicine

## 2016-01-15 NOTE — Telephone Encounter (Signed)
Writer spoke to patient who stated that [facility] faxed her prescription for the eye drops over to our pharmacy here and that she would be in on Tuesday to pick them up.

## 2016-01-15 NOTE — Telephone Encounter (Signed)
Patient called the office to follow up on the conversation that she had with nurse regarding her medication (drops). Please follow up.   Thank you.

## 2016-01-19 MED FILL — DORZOLAMIDE-TIMOLOL EYE DRP: 22.3-6.8 | 80 days supply | Qty: 10 | Fill #0

## 2016-01-29 ENCOUNTER — Ambulatory Visit
Admission: RE | Admit: 2016-01-29 | Discharge: 2016-01-29 | Disposition: A | Discharge status: Home / Self Care | Payer: BLUE CROSS/BLUE SHIELD | Source: Ambulatory Visit | Attending: Family Medicine | Admitting: Family Medicine

## 2016-01-29 DIAGNOSIS — Z1239 Encounter for other screening for malignant neoplasm of breast: Secondary | ICD-10-CM

## 2016-02-08 MED FILL — !LANTUS SOLOSTAR 100UNITS/M: 100 | 28 days supply | Qty: 9 | Fill #4

## 2016-02-10 ENCOUNTER — Telehealth: Payer: Self-pay | Admitting: Family Medicine

## 2016-02-10 MED ORDER — TRUE METRIX METER DEVI: 1.0000 | Freq: Three times a day (TID) | 0 refills | Status: DC

## 2016-02-10 MED ORDER — RANITIDINE HCL 150 MG PO TABS: 150.0000 mg | ORAL_TABLET | Freq: Two times a day (BID) | ORAL | 1 refills | Status: DC

## 2016-02-10 NOTE — Telephone Encounter (Signed)
Prescription for ranitidine and glucometer sent to the pharmacy. We do have an LCSW she could speak with due to recent bereavement. If she has chest pain she would need to go to the ED.

## 2016-02-10 NOTE — Telephone Encounter (Signed)
C/o Periodic dull pain on the left side of her chest.  She has been stressed that the tenant that lives with her has died. Pain in chest rated at 1/10. Pain come and goes. She figured it would be from the gas b/c she has a problem with belching. Denies SOB or sweatiness. Was given results for mammogram but is unable to read them d/t vision impairment.  She does not feel that she needs to take Xanax when asked if she has medication for anxiety.  She states she has been told she has problems with reflux but doesn't have any medications to take for it.  Also adds that she lost her meter and request another one to be called to pharmacy. pls inform of  Mammogram results. Pt instructed to go to ED if symptoms progress and worsen before being able to hear from provider or nurse.

## 2016-02-10 NOTE — Telephone Encounter (Signed)
Patient called and said that she's experiencing pain in chest left side in breast area. Called routed to nurse  triage

## 2016-02-10 NOTE — Telephone Encounter (Signed)
Left message on voicemail.

## 2016-02-11 MED FILL — raNITIdine HCL 150 MG TABS: 150 | 30 days supply | Qty: 60 | Fill #0

## 2016-02-11 NOTE — Telephone Encounter (Signed)
Pt aware. She would like to speak to and make apt with CSW about bereavement tenant/roomate.

## 2016-02-12 ENCOUNTER — Telehealth: Payer: Self-pay | Admitting: Licensed Clinical Social Worker

## 2016-02-12 ENCOUNTER — Telehealth: Payer: Self-pay

## 2016-02-12 NOTE — Telephone Encounter (Signed)
Writer called patient and discussed her mammogram results.  Patient stated understanding.

## 2016-02-12 NOTE — Telephone Encounter (Signed)
-----   Message from Arnoldo Morale, MD sent at 02/01/2016 12:39 PM EST ----- Mammogram is negative for malignancy

## 2016-02-12 NOTE — Telephone Encounter (Signed)
LCSWA contacted pt via telephone to follow-up on requested information for bereavement resources.  LCSWA introduced self and explained role at [facility].  Pt reported that her tenant recently passed away last week and she is having difficulty coping stating "only prayers got me through". LCSWA discussed benefits of initiating grief counseling through hospice. Pt stated that she is interested; however, is unable to obtain contact information due to driving from out of town. LCSWA agreed to contact pt on Monday, January 29, 18 to provide requested information. Pt was appreciative for the assistance.

## 2016-02-15 ENCOUNTER — Telehealth: Payer: Self-pay | Admitting: Licensed Clinical Social Worker

## 2016-02-15 NOTE — Telephone Encounter (Signed)
LCSWA attempted to contact pt via telephone to provide requested grief counseling resources. LCSWA left a message for a return call.

## 2016-02-15 NOTE — Telephone Encounter (Signed)
Message received from the patient requesting a call back.   LCSWA placed call and provided requested grief support resources. No additional concerns noted.

## 2016-02-23 MED FILL — metFORMIN HCL 1000 MG TABS: 1000 | 90 days supply | Qty: 180 | Fill #1

## 2016-02-25 MED FILL — CLOPIDOGREL 75 MG TABLET: 75 | 90 days supply | Qty: 90 | Fill #1

## 2016-03-01 MED FILL — BRIMONIDINE TARTRATE 0.15%: 0.15 | 15 days supply | Qty: 5 | Fill #2

## 2016-03-01 MED FILL — !LANTUS 100 UNITS/ML VIAL: 100 | 28 days supply | Qty: 10 | Fill #0

## 2016-03-07 ENCOUNTER — Telehealth: Payer: Self-pay | Admitting: Family Medicine

## 2016-03-07 NOTE — Telephone Encounter (Signed)
error 

## 2016-03-07 NOTE — Telephone Encounter (Signed)
Pt called requesting [facility] records faxed over to [employer]  At [phone]. Please f/up

## 2016-03-10 NOTE — Telephone Encounter (Signed)
Writer called patient and discussed exactly what she was requesting.  Writer faxed the last note received from [facility] to the [place].  Patient states she has applied for a job there.

## 2016-03-29 MED FILL — BRIMONIDINE TARTRATE 0.15%: 0.15 | 40 days supply | Qty: 5 | Fill #0

## 2016-03-29 MED FILL — ?ATORVASTATIN 40MG TABLET: 40 | 90 days supply | Qty: 90 | Fill #1

## 2016-04-07 ENCOUNTER — Telehealth: Payer: Self-pay | Admitting: Family Medicine

## 2016-04-07 NOTE — Telephone Encounter (Signed)
Pt calling requesting to speak to nurse when available in regards to medical forms that were filled out for [facility]

## 2016-04-12 ENCOUNTER — Telehealth: Payer: Self-pay | Admitting: Family Medicine

## 2016-04-12 NOTE — Telephone Encounter (Signed)
Patient called the office asking to speak with PCP regarding a document that she needs PCP to fill out. Pt had dropped off document a while back and hasn't head any updates. Please follow up.  Thank you.

## 2016-04-13 MED FILL — LANTUS 100 UNITS/ML VIAL: 100 | 28 days supply | Qty: 10 | Fill #1

## 2016-04-13 MED FILL — NovoLOG 100 UNIT/ML SOLN: 100 | 42 days supply | Qty: 10 | Fill #0

## 2016-04-13 MED FILL — TRUE METRIX TEST STRIP: 15 days supply | Qty: 50 | Fill #2

## 2016-04-13 NOTE — Telephone Encounter (Signed)
She had a disability form completed in her presence at her visit of 01/04/16 after which it was returned to her.

## 2016-04-14 ENCOUNTER — Telehealth: Payer: Self-pay

## 2016-04-14 NOTE — Telephone Encounter (Signed)
Patient stopped in clinic today to talk about her disability paperwork.  After she left writer printed out requested OV notes, lab results and imaging results from 11/2015 til present. Writer faxed the information to [employer] her case Freight forwarder. Writer then called patient and LVM regarding this information.

## 2016-05-04 ENCOUNTER — Encounter: Payer: Self-pay | Admitting: Family Medicine

## 2016-05-04 ENCOUNTER — Ambulatory Visit: Payer: BLUE CROSS/BLUE SHIELD | Attending: Family Medicine | Admitting: Family Medicine

## 2016-05-04 VITALS — BP 145/95 | HR 81 | Temp 98.1°F | Ht 66.0 in | Wt 190.6 lb

## 2016-05-04 DIAGNOSIS — Z885 Allergy status to narcotic agent status: Secondary | ICD-10-CM | POA: Insufficient documentation

## 2016-05-04 DIAGNOSIS — I10 Essential (primary) hypertension: Secondary | ICD-10-CM | POA: Insufficient documentation

## 2016-05-04 DIAGNOSIS — R229 Localized swelling, mass and lump, unspecified: Secondary | ICD-10-CM

## 2016-05-04 DIAGNOSIS — Z888 Allergy status to other drugs, medicaments and biological substances status: Secondary | ICD-10-CM | POA: Diagnosis not present

## 2016-05-04 DIAGNOSIS — Z1211 Encounter for screening for malignant neoplasm of colon: Secondary | ICD-10-CM

## 2016-05-04 DIAGNOSIS — Z880 Allergy status to penicillin: Secondary | ICD-10-CM | POA: Insufficient documentation

## 2016-05-04 DIAGNOSIS — Z7902 Long term (current) use of antithrombotics/antiplatelets: Secondary | ICD-10-CM | POA: Diagnosis not present

## 2016-05-04 DIAGNOSIS — E1169 Type 2 diabetes mellitus with other specified complication: Secondary | ICD-10-CM

## 2016-05-04 DIAGNOSIS — E7800 Pure hypercholesterolemia, unspecified: Secondary | ICD-10-CM | POA: Diagnosis not present

## 2016-05-04 DIAGNOSIS — Z7982 Long term (current) use of aspirin: Secondary | ICD-10-CM | POA: Insufficient documentation

## 2016-05-04 DIAGNOSIS — Z794 Long term (current) use of insulin: Secondary | ICD-10-CM

## 2016-05-04 DIAGNOSIS — H3411 Central retinal artery occlusion, right eye: Secondary | ICD-10-CM | POA: Diagnosis not present

## 2016-05-04 DIAGNOSIS — K3184 Gastroparesis: Secondary | ICD-10-CM | POA: Insufficient documentation

## 2016-05-04 DIAGNOSIS — Z79899 Other long term (current) drug therapy: Secondary | ICD-10-CM | POA: Diagnosis not present

## 2016-05-04 DIAGNOSIS — E1143 Type 2 diabetes mellitus with diabetic autonomic (poly)neuropathy: Secondary | ICD-10-CM | POA: Diagnosis not present

## 2016-05-04 DIAGNOSIS — E119 Type 2 diabetes mellitus without complications: Secondary | ICD-10-CM | POA: Diagnosis present

## 2016-05-04 LAB — POCT GLYCOSYLATED HEMOGLOBIN (HGB A1C): HEMOGLOBIN A1C: 10.9

## 2016-05-04 LAB — GLUCOSE, POCT (MANUAL RESULT ENTRY): POC GLUCOSE: 168 mg/dL — AB (ref 70–99)

## 2016-05-04 MED ORDER — INSULIN ASPART 100 UNIT/ML FLEXPEN: 8.0000 [IU] | PEN_INJECTOR | Freq: Three times a day (TID) | SUBCUTANEOUS | 3 refills | Status: DC

## 2016-05-04 MED ORDER — INSULIN GLARGINE 100 UNIT/ML SOLOSTAR PEN: 40.0000 [IU] | PEN_INJECTOR | Freq: Every day | SUBCUTANEOUS | 3 refills | Status: DC

## 2016-05-04 MED ORDER — ONETOUCH ULTRA SYSTEM W/DEVICE KIT: 1.0000 | PACK | Freq: Three times a day (TID) | 0 refills | Status: DC

## 2016-05-04 MED ORDER — METOCLOPRAMIDE HCL 10 MG PO TABS: 10.0000 mg | ORAL_TABLET | Freq: Three times a day (TID) | ORAL | 1 refills | Status: DC | PRN

## 2016-05-04 MED ORDER — GLUCOSE BLOOD VI STRP: ORAL_STRIP | 12 refills | Status: DC

## 2016-05-04 MED ORDER — LOSARTAN POTASSIUM 50 MG PO TABS: 50.0000 mg | ORAL_TABLET | Freq: Every day | ORAL | 3 refills | Status: DC

## 2016-05-04 MED FILL — LOSARTAN POTASSIUM 50 MG TA: 50 | 30 days supply | Qty: 30 | Fill #0

## 2016-05-04 MED FILL — METOCLOPRAMIDE 10 MG TABLET: 10 | 30 days supply | Qty: 90 | Fill #0

## 2016-05-04 MED FILL — TRUE METRIX TEST STRIP: 15 days supply | Qty: 50 | Fill #3

## 2016-05-04 MED FILL — LANTUS SOLOSTAR 100 UNITS/M: 100 | 30 days supply | Qty: 12 | Fill #0

## 2016-05-04 NOTE — Progress Notes (Signed)
Subjective:  Patient ID: Adrienne [name], female    DOB: 09-08-1955  Age: 61 y.o. MRN: [id number]  CC: Diabetes; Hypertension; Nausea (after eating); Eye Problem (pressure is up in right eye); and Mass (abdominal)   HPI Adrienne [name]  is a 61 year old female with a history of uncontrolled type 2 diabetes mellitus (A1c 10.9 which has trended up from 7.9 previously), anemia, central retinal artery occlusion, hypertension who comes into the clinic for a follow-up visit.  Her blood pressure is elevated and she has not been on any antihypertensive as this was discontinued 9 months ago during a hospitalization in which she was hypotensive.  She has been compliant with NovoLog and Lantus but not with a diabetic diet. She is requesting switching from Lantus vials to pens due to her visual impairment. Denies numbness in extremities.  She is closely followed by [facility] due to the history of  right central retinal artery occlusion.  Complains of nausea especially after meals and this has been on for the last few weeks. [place] a nodule on her anterior abdominal wall which is not tender and has been present for the last few months with no increase in size.  Past Medical History:  Diagnosis Date  . Diabetes ([place])   . High cholesterol   . Hypertension     Past Surgical History:  Procedure Laterality Date  . CATARACT EXTRACTION, BILATERAL     Laser eye surgery, cataract surgery bi-lat  . CESAREAN SECTION     Twins, Breech  . EYE SURGERY Bilateral 2012   LASER, CATARACT  . TEE WITHOUT CARDIOVERSION N/A 12/31/2013   Procedure: TRANSESOPHAGEAL ECHOCARDIOGRAM (TEE);  Surgeon: Laverda Page, MD;  Location: [facility];  Service: Cardiovascular;  Laterality: N/A;  H&P in file  . TUBAL LIGATION Bilateral     Allergies  Allergen Reactions  . Codeine Itching  . Hydrocodone Itching  . Niacin And Related Itching  . Oxycodone Itching  . Penicillin G Itching       Outpatient Medications Prior to Visit  Medication Sig Dispense Refill  . aspirin EC 81 MG tablet Take 81 mg by mouth [name].     Marland Kitchen atorvastatin (LIPITOR) 40 MG tablet Take 1 tablet (40 mg total) by mouth [name]. 90 tablet 1  . blood glucose meter kit and supplies Dispense based on patient and insurance preference. Use up to four times [name] as directed. (FOR ICD-9 250.00, 250.01). 1 each 0  . Blood Glucose Monitoring Suppl (TRUE METRIX METER) DEVI 1 each by Does not apply route 3 (three) times [name] before meals. 1 Device 0  . brimonidine (ALPHAGAN) 0.15 % ophthalmic solution Place 1 drop into both eyes 2 (two) times [name].     . clopidogrel (PLAVIX) 75 MG tablet Take 1 tablet (75 mg total) by mouth [name]. 90 tablet 1  . dorzolamide-timolol (COSOPT) 22.3-6.8 MG/ML ophthalmic solution Place 1 drop into both eyes 2 (two) times [name].   2  . Insulin Pen Needle 31G X 5 MM MISC 1 Act by Does not apply route 4 (four) times [name] - after meals and at bedtime. 300 each 0  . metFORMIN (GLUCOPHAGE) 1000 MG tablet Take 1 tablet (1,000 mg total) by mouth 2 (two) times [name]. 180 tablet 1  . glucose blood (TRUE METRIX BLOOD GLUCOSE TEST) test strip Use as instructed 100 each 12  . insulin aspart (NOVOLOG) 100 UNIT/ML injection Inject 8 Units into the skin 3 (three) times [name] with meals. 30 mL 1  .  Insulin Glargine (LANTUS) 100 UNIT/ML Solostar Pen Inject 30 Units into the skin [name] at 10 pm. 45 mL 1  . cetirizine (ZYRTEC) 10 MG tablet Take 1 tablet (10 mg total) by mouth [name]. (Patient not taking: Reported on 01/04/2016) 30 tablet 3  . ranitidine (ZANTAC) 150 MG tablet Take 1 tablet (150 mg total) by mouth 2 (two) times [name]. (Patient not taking: Reported on 05/04/2016) 60 tablet 1   No facility-administered medications prior to visit.     ROS Review of Systems  Constitutional: Negative for activity change, appetite change and fatigue.  HENT: Negative for congestion, sinus pressure and sore  throat.   Eyes: Positive for visual disturbance.  Respiratory: Negative for cough, chest tightness, shortness of breath and wheezing.   Cardiovascular: Negative for chest pain and palpitations.  Gastrointestinal: Positive for nausea. Negative for abdominal distention, abdominal pain and constipation.  Endocrine: Negative for polydipsia.  Genitourinary: Negative for dysuria and frequency.  Musculoskeletal: Negative for arthralgias and back pain.  Skin: Positive for rash.  Neurological: Negative for tremors, light-headedness and numbness.  Hematological: Does not bruise/bleed easily.  Psychiatric/Behavioral: Negative for agitation and behavioral problems.    Objective:  BP (!) 145/95 (BP Location: Right Arm, Patient Position: Sitting, Cuff Size: Small)   Pulse 81   Temp 98.1 F (36.7 C) (Oral)   Ht 5' 6"  (1.676 m)   Wt 190 lb 9.6 oz (86.5 kg)   SpO2 95%   BMI 30.76 kg/m   BP/Weight 05/04/2016 01/04/2016 60/73/7106  Systolic BP 269 485 462  Diastolic BP 95 70 80  Wt. (Lbs) 190.6 167.6 186  BMI 30.76 27.05 30.02      Physical Exam  Constitutional: She is oriented to person, place, and time. She appears well-developed and well-nourished.  Cardiovascular: Normal rate, normal heart sounds and intact distal pulses.   No murmur heard. Pulmonary/Chest: Effort normal and breath sounds normal. She has no wheezes. She has no rales. She exhibits no tenderness.  Abdominal: Soft. Bowel sounds are normal. She exhibits no distension and no mass. There is no tenderness.  Musculoskeletal: Normal range of motion.  Neurological: She is alert and oriented to person, place, and time.  Skin: Skin is warm and dry.  0.5 x 0.5cm nodule on anterior abdominal wall  Psychiatric: She has a normal mood and affect.      CMP Latest Ref Rng & Units 01/04/2016 09/11/2015 09/08/2015  Glucose 65 - 99 mg/dL 170(H) 248(H) 253(H)  BUN 7 - 25 mg/dL 11 15 11   Creatinine 0.50 - 0.99 mg/dL 0.85 0.90 0.74  Sodium  135 - 146 mmol/L 139 140 136  Potassium 3.5 - 5.3 mmol/L 4.1 3.8 3.6  Chloride 98 - 110 mmol/L 104 108 113(H)  CO2 20 - 31 mmol/L 27 20 19(L)  Calcium 8.6 - 10.4 mg/dL 9.4 9.0 8.1(L)  Total Protein 6.1 - 8.1 g/dL 7.2 - -  Total Bilirubin 0.2 - 1.2 mg/dL 0.3 - -  Alkaline Phos 33 - 130 U/L 89 - -  AST 10 - 35 U/L 15 - -  ALT 6 - 29 U/L 13 - -    Lipid Panel     Component Value Date/Time   CHOL 144 01/04/2016 1138   TRIG 58 01/04/2016 1138   HDL 53 01/04/2016 1138   CHOLHDL 2.7 01/04/2016 1138   VLDL 12 01/04/2016 1138   LDLCALC 79 01/04/2016 1138    Lab Results  Component Value Date   HGBA1C 10.9 05/04/2016  Assessment & Plan:   1. Type 2 diabetes mellitus with other specified complication, with long-term current use of insulin (HCC) Uncontrolled with A1c of 10.9 which has trended up from 7.9 previously Increase Lantus dose to 40 units at bedtime Continue NovoLog - Glucose (CBG) - HgB A1c - Insulin Glargine (LANTUS SOLOSTAR) 100 UNIT/ML Solostar Pen; Inject 40 Units into the skin [name] at 10 pm.  Dispense: 5 pen; Refill: 3 - insulin aspart (NOVOLOG FLEXPEN) 100 UNIT/ML FlexPen; Inject 8 Units into the skin 3 (three) times [name] before meals.  Dispense: 15 mL; Refill: 3 - Blood Glucose Monitoring Suppl (ONE TOUCH ULTRA SYSTEM KIT) w/Device KIT; 1 kit by Does not apply route 3 (three) times [name].  Dispense: 1 each; Refill: 0 - glucose blood (ONE TOUCH ULTRA TEST) test strip; Use as instructed 3 times [name] before meals  Dispense: 100 each; Refill: 12 - CMP14+EGFR  2. Essential hypertension Uncontrolled Commenced losartan Low-sodium diet - losartan (COZAAR) 50 MG tablet; Take 1 tablet (50 mg total) by mouth [name].  Dispense: 30 tablet; Refill: 3  3. Central retinal artery occlusion of right eye Managed by ophthalmology  4. Gastroparesis Secondary to diabetes Advised to eat small frequent meals - metoCLOPramide (REGLAN) 10 MG tablet; Take 1 tablet (10 mg total) by  mouth every 8 (eight) hours as needed for nausea.  Dispense: 90 tablet; Refill: 1  5. Local superficial swelling, mass or lump Likely cyst on anterior abdominal wall Patient reassured  6. Screening for colon cancer - Ambulatory referral to Gastroenterology   Meds ordered this encounter  Medications  . losartan (COZAAR) 50 MG tablet    Sig: Take 1 tablet (50 mg total) by mouth [name].    Dispense:  30 tablet    Refill:  3  . Insulin Glargine (LANTUS SOLOSTAR) 100 UNIT/ML Solostar Pen    Sig: Inject 40 Units into the skin [name] at 10 pm.    Dispense:  5 pen    Refill:  3    Dispense pens- visually impaired. Discontinue previous dose  . insulin aspart (NOVOLOG FLEXPEN) 100 UNIT/ML FlexPen    Sig: Inject 8 Units into the skin 3 (three) times [name] before meals.    Dispense:  15 mL    Refill:  3  . metoCLOPramide (REGLAN) 10 MG tablet    Sig: Take 1 tablet (10 mg total) by mouth every 8 (eight) hours as needed for nausea.    Dispense:  90 tablet    Refill:  1  . Blood Glucose Monitoring Suppl (ONE TOUCH ULTRA SYSTEM KIT) w/Device KIT    Sig: 1 kit by Does not apply route 3 (three) times [name].    Dispense:  1 each    Refill:  0  . glucose blood (ONE TOUCH ULTRA TEST) test strip    Sig: Use as instructed 3 times [name] before meals    Dispense:  100 each    Refill:  12    Follow-up: Return in about 1 month (around 06/03/2016) for Follow-up on hypertension.   Arnoldo Morale MD

## 2016-05-04 NOTE — Patient Instructions (Signed)
Gastroparesis Gastroparesis, also called delayed gastric emptying, is a condition in which food takes longer than normal to empty from the stomach. The condition is usually long-lasting (chronic). What are the causes? This condition may be caused by:  An endocrine disorder, such as hypothyroidism or diabetes. Diabetes is the most common cause of this condition.  A nervous system disease, such as Parkinson disease or multiple sclerosis.  Cancer, infection, or surgery of the stomach or vagus nerve.  A connective tissue disorder, such as scleroderma.  Certain medicines. In most cases, the cause is not known. What increases the risk? This condition is more likely to develop in:  People with certain disorders, including endocrine disorders, eating disorders, amyloidosis, and scleroderma.  People with certain diseases, including Parkinson disease or multiple sclerosis.  People with cancer or infection of the stomach or vagus nerve.  People who have had surgery on the stomach or vagus nerve.  People who take certain medicines.  Women. What are the signs or symptoms? Symptoms of this condition include:  An early feeling of fullness when eating.  Nausea.  Weight loss.  Vomiting.  Heartburn.  Abdominal bloating.  Inconsistent blood glucose levels.  Lack of appetite.  Acid from the stomach coming up into the esophagus (gastroesophageal reflux).  Spasms of the stomach. Symptoms may come and go. How is this diagnosed? This condition is diagnosed with tests, such as:  Tests that check how long it takes food to move through the stomach and intestines. These tests include:  Upper gastrointestinal (GI) series. In this test, X-rays of the intestines are taken after you drink a liquid. The liquid makes the intestines show up better on the X-rays.  Gastric emptying scintigraphy. In this test, scans are taken after you eat food that contains a small amount of radioactive  material.  Wireless capsule GI monitoring system. This test involves swallowing a capsule that records information about movement through the stomach.  Gastric manometry. This test measures electrical and muscular activity in the stomach. It is done with a thin tube that is passed down the throat and into the stomach.  Endoscopy. This test checks for abnormalities in the lining of the stomach. It is done with a long, thin tube that is passed down the throat and into the stomach.  An ultrasound. This test can help rule out gallbladder disease or pancreatitis as a cause of your symptoms. It uses sound waves to take pictures of the inside of your body. How is this treated? There is no cure for gastroparesis. This condition may be managed with:  Treatment of the underlying condition causing the gastroparesis.  Lifestyle changes, including exercise and dietary changes. Dietary changes can include:  Changes in what and when you eat.  Eating smaller meals more often.  Eating low-fat foods.  Eating low-fiber forms of high-fiber foods, such as cooked vegetables instead of raw vegetables.  Having liquid foods in place of solid foods. Liquid foods are easier to digest.  Medicines. These may be given to control nausea and vomiting and to stimulate stomach muscles.  Getting food through a feeding tube. This may be done in severe cases.  A gastric neurostimulator. This is a device that is inserted into the body with surgery. It helps improve stomach emptying and control nausea and vomiting. Follow these instructions at home:  Follow your health care provider's instructions about exercise and diet.  Take medicines only as directed by your health care provider. Contact a health care provider if:  Your symptoms do not improve with treatment.  You have new symptoms. Get help right away if:  You have severe abdominal pain that does not improve with treatment.  You have nausea that does not  go away.  You cannot keep fluids down. This information is not intended to replace advice given to you by your health care provider. Make sure you discuss any questions you have with your health care provider. Document Released: 01/03/2005 Document Revised: 06/11/2015 Document Reviewed: 12/30/2013 Elsevier Interactive Patient Education  2017 [employer].

## 2016-05-04 NOTE — Progress Notes (Signed)
Meter not working Needs lantus pen due to eye sight

## 2016-05-05 LAB — CMP14+EGFR
A/G RATIO: 1.5 (ref 1.2–2.2)
ALT: 16 IU/L (ref 0–32)
AST: 14 IU/L (ref 0–40)
Albumin: 4 g/dL (ref 3.6–4.8)
Alkaline Phosphatase: 97 IU/L (ref 39–117)
BUN/Creatinine Ratio: 17 (ref 12–28)
BUN: 15 mg/dL (ref 8–27)
Bilirubin Total: 0.3 mg/dL (ref 0.0–1.2)
CALCIUM: 9.3 mg/dL (ref 8.7–10.3)
CO2: 25 mmol/L (ref 18–29)
Chloride: 100 mmol/L (ref 96–106)
Creatinine, Ser: 0.89 mg/dL (ref 0.57–1.00)
GFR calc Af Amer: 81 mL/min/{1.73_m2} (ref >59)
GFR, EST NON AFRICAN AMERICAN: 70 mL/min/{1.73_m2} (ref >59)
Globulin, Total: 2.7 g/dL (ref 1.5–4.5)
Glucose: 204 mg/dL — ABNORMAL HIGH (ref 65–99)
POTASSIUM: 4.3 mmol/L (ref 3.5–5.2)
Sodium: 139 mmol/L (ref 134–144)
Total Protein: 6.7 g/dL (ref 6.0–8.5)

## 2016-05-05 NOTE — Telephone Encounter (Signed)
-----   Message from Arnoldo Morale, MD sent at 05/05/2016  9:07 AM EDT ----- Normal kidney, liver function; glucose is elevated, please comply with new dose of Lantus and diabetic diet

## 2016-05-05 NOTE — Telephone Encounter (Signed)
Writer called patient and discussed lab results per Dr. Jarold Song with patient.  Patient stated understanding.

## 2016-05-10 ENCOUNTER — Other Ambulatory Visit: Payer: Self-pay | Admitting: Family Medicine

## 2016-05-10 DIAGNOSIS — Z794 Long term (current) use of insulin: Principal | ICD-10-CM

## 2016-05-10 DIAGNOSIS — E1169 Type 2 diabetes mellitus with other specified complication: Secondary | ICD-10-CM

## 2016-05-10 MED FILL — BRIMONIDINE TARTRATE 0.15%: 0.15 | 40 days supply | Qty: 5 | Fill #1

## 2016-05-11 MED FILL — ?METFORMIN HCL 1,000 MG TAB: 1000 | 30 days supply | Qty: 60 | Fill #0

## 2016-05-23 MED FILL — TIMOLOL 0.5% EYE DROPS: 0.5 | 90 days supply | Qty: 15 | Fill #0

## 2016-05-26 ENCOUNTER — Other Ambulatory Visit: Payer: Self-pay | Admitting: Family Medicine

## 2016-05-26 DIAGNOSIS — H3411 Central retinal artery occlusion, right eye: Secondary | ICD-10-CM

## 2016-05-26 MED FILL — CLOPIDOGREL 75 MG TABLET: 75 | 90 days supply | Qty: 90 | Fill #0

## 2016-05-26 NOTE — Telephone Encounter (Signed)
PT called to request a refill for clopidogrel (PLAVIX) 75 MG tablet  She is on the last one and she want to know if can get the prescription today or tomorrow, she was advice that usually take 24 to 48 hours to get a prescription, please follow up with PT

## 2016-05-26 NOTE — Telephone Encounter (Signed)
Patient is requesting a refill on Plavix.

## 2016-05-27 ENCOUNTER — Telehealth: Payer: Self-pay | Admitting: Family Medicine

## 2016-05-27 NOTE — Telephone Encounter (Signed)
Error

## 2016-06-02 ENCOUNTER — Other Ambulatory Visit: Payer: Self-pay | Admitting: Family Medicine

## 2016-06-02 DIAGNOSIS — E7800 Pure hypercholesterolemia, unspecified: Secondary | ICD-10-CM

## 2016-06-02 MED FILL — ATORVASTATIN 40 MG TABLET: 40 | 30 days supply | Qty: 30 | Fill #0

## 2016-06-02 MED FILL — LANTUS SOLOSTAR 100 UNITS/M: 100 | 30 days supply | Qty: 12 | Fill #1

## 2016-06-02 MED FILL — LOSARTAN POTASSIUM 50 MG TA: 50 | 30 days supply | Qty: 30 | Fill #1

## 2016-06-03 ENCOUNTER — Encounter: Payer: Self-pay | Admitting: Family Medicine

## 2016-06-03 ENCOUNTER — Other Ambulatory Visit: Payer: Self-pay | Admitting: Family Medicine

## 2016-06-03 ENCOUNTER — Ambulatory Visit: Payer: BLUE CROSS/BLUE SHIELD | Attending: Family Medicine | Admitting: Family Medicine

## 2016-06-03 VITALS — BP 171/71 | HR 76 | Temp 98.2°F | Resp 18 | Ht 66.0 in | Wt 197.0 lb

## 2016-06-03 DIAGNOSIS — I10 Essential (primary) hypertension: Secondary | ICD-10-CM | POA: Diagnosis present

## 2016-06-03 DIAGNOSIS — Z794 Long term (current) use of insulin: Secondary | ICD-10-CM | POA: Insufficient documentation

## 2016-06-03 DIAGNOSIS — D649 Anemia, unspecified: Secondary | ICD-10-CM | POA: Diagnosis not present

## 2016-06-03 DIAGNOSIS — Z880 Allergy status to penicillin: Secondary | ICD-10-CM | POA: Insufficient documentation

## 2016-06-03 DIAGNOSIS — M545 Low back pain, unspecified: Secondary | ICD-10-CM

## 2016-06-03 DIAGNOSIS — Z7982 Long term (current) use of aspirin: Secondary | ICD-10-CM | POA: Insufficient documentation

## 2016-06-03 DIAGNOSIS — E1100 Type 2 diabetes mellitus with hyperosmolarity without nonketotic hyperglycemic-hyperosmolar coma (NKHHC): Secondary | ICD-10-CM | POA: Diagnosis not present

## 2016-06-03 DIAGNOSIS — Z9889 Other specified postprocedural states: Secondary | ICD-10-CM | POA: Diagnosis not present

## 2016-06-03 DIAGNOSIS — Z79899 Other long term (current) drug therapy: Secondary | ICD-10-CM | POA: Insufficient documentation

## 2016-06-03 DIAGNOSIS — R600 Localized edema: Secondary | ICD-10-CM

## 2016-06-03 LAB — GLUCOSE, POCT (MANUAL RESULT ENTRY): POC GLUCOSE: 285 mg/dL — AB (ref 70–99)

## 2016-06-03 MED ORDER — METHOCARBAMOL 750 MG PO TABS: 750.0000 mg | ORAL_TABLET | Freq: Three times a day (TID) | ORAL | 1 refills | Status: DC | PRN

## 2016-06-03 MED ORDER — METHOCARBAMOL 500 MG PO TABS: 1000.0000 mg | ORAL_TABLET | Freq: Two times a day (BID) | ORAL | 1 refills | Status: DC | PRN

## 2016-06-03 MED ORDER — LOSARTAN POTASSIUM-HCTZ 100-25 MG PO TABS: 1.0000 | ORAL_TABLET | Freq: Every day | ORAL | 3 refills | Status: DC

## 2016-06-03 MED FILL — METHOCARBAMOL 500 MG TABLET: 500 | 30 days supply | Qty: 120 | Fill #0

## 2016-06-03 MED FILL — BRIMONIDINE TARTRATE 0.15%: 0.15 | 10 days supply | Qty: 5 | Fill #0

## 2016-06-03 MED FILL — LOSARTAN-HCTZ 100-25 MG TAB: 100-25 | 30 days supply | Qty: 30 | Fill #0

## 2016-06-03 NOTE — Progress Notes (Signed)
Subjective:  Patient ID: Adrienne [name], female    DOB: 1955/09/19  Age: 61 y.o. MRN: [id number]  CC: Hypertension and Diabetes   HPI Adrienne [name] is a 61 year old female with a history of uncontrolled type 2 diabetes mellitus (A1c 10.9 which has trended up from 7.9 previously), anemia, central retinal artery occlusion (Followed by [facility]), hypertension who comes into the clinic for a follow-up of her hypertension.  She has been compliant with losartan but blood pressure is elevated today. She has not been compliant with a low-sodium or diabetic diet and does eat a lot of [place] food which she states she has noticed causes her sugars to rise.  She also complains of bilateral lower extremity pedal edema ever since she began her new job with [employer] 3 weeks ago; this job entails sitting for prolonged hours. Denies shortness of breath or chest pains.  She also has low back pain on getting up from a sitting position but denies radiation to her legs. Denies numbness, loss of sphincteric function.  Past Medical History:  Diagnosis Date  . Diabetes ([place])   . High cholesterol   . Hypertension     Past Surgical History:  Procedure Laterality Date  . CATARACT EXTRACTION, BILATERAL     Laser eye surgery, cataract surgery bi-lat  . CESAREAN SECTION     Twins, Breech  . EYE SURGERY Bilateral 2012   LASER, CATARACT  . TEE WITHOUT CARDIOVERSION N/A 12/31/2013   Procedure: TRANSESOPHAGEAL ECHOCARDIOGRAM (TEE);  Surgeon: Laverda Page, MD;  Location: [facility];  Service: Cardiovascular;  Laterality: N/A;  H&P in file  . TUBAL LIGATION Bilateral     Allergies  Allergen Reactions  . Codeine Itching  . Hydrocodone Itching  . Niacin And Related Itching  . Oxycodone Itching  . Penicillin G Itching     Outpatient Medications Prior to Visit  Medication Sig Dispense Refill  . aspirin EC 81 MG tablet Take 81 mg by mouth daily.     Marland Kitchen atorvastatin  (LIPITOR) 40 MG tablet TAKE 1 TABLET BY MOUTH DAILY. 90 tablet 0  . blood glucose meter kit and supplies Dispense based on patient and insurance preference. Use up to four times daily as directed. (FOR ICD-9 250.00, 250.01). 1 each 0  . Blood Glucose Monitoring Suppl (ONE TOUCH ULTRA SYSTEM KIT) w/Device KIT 1 kit by Does not apply route 3 (three) times daily. 1 each 0  . Blood Glucose Monitoring Suppl (TRUE METRIX METER) DEVI 1 each by Does not apply route 3 (three) times daily before meals. 1 Device 0  . brimonidine (ALPHAGAN) 0.15 % ophthalmic solution Place 1 drop into both eyes 2 (two) times daily.     . clopidogrel (PLAVIX) 75 MG tablet TAKE 1 TABLET BY MOUTH DAILY. 90 tablet 1  . dorzolamide-timolol (COSOPT) 22.3-6.8 MG/ML ophthalmic solution Place 1 drop into both eyes 2 (two) times daily.   2  . glucose blood (ONE TOUCH ULTRA TEST) test strip Use as instructed 3 times daily before meals 100 each 12  . insulin aspart (NOVOLOG FLEXPEN) 100 UNIT/ML FlexPen Inject 8 Units into the skin 3 (three) times daily before meals. 15 mL 3  . Insulin Glargine (LANTUS SOLOSTAR) 100 UNIT/ML Solostar Pen Inject 40 Units into the skin daily at 10 pm. 5 pen 3  . Insulin Pen Needle 31G X 5 MM MISC 1 Act by Does not apply route 4 (four) times daily - after meals and at bedtime. 300 each  0  . metFORMIN (GLUCOPHAGE) 1000 MG tablet TAKE 1 TABLET BY MOUTH 2 TIMES DAILY. 180 tablet 1  . metoCLOPramide (REGLAN) 10 MG tablet Take 1 tablet (10 mg total) by mouth every 8 (eight) hours as needed for nausea. 90 tablet 1  . losartan (COZAAR) 50 MG tablet Take 1 tablet (50 mg total) by mouth daily. 30 tablet 3  . cetirizine (ZYRTEC) 10 MG tablet Take 1 tablet (10 mg total) by mouth daily. (Patient not taking: Reported on 01/04/2016) 30 tablet 3  . ranitidine (ZANTAC) 150 MG tablet Take 1 tablet (150 mg total) by mouth 2 (two) times daily. (Patient not taking: Reported on 05/04/2016) 60 tablet 1   No facility-administered  medications prior to visit.     ROS Review of Systems Review of Systems  Constitutional: Negative for activity change, appetite change and fatigue.  HENT: Negative for congestion, sinus pressure and sore throat.   Eyes: Positive for visual disturbance.  Respiratory: Negative for cough, chest tightness, shortness of breath and wheezing.   Cardiovascular: Negative for chest pain and palpitations.  positive for leg swelling Gastrointestinal: Negative for abdominal distention, abdominal pain and constipation.  Endocrine: Negative for polydipsia.  Genitourinary: Negative for dysuria and frequency.  Musculoskeletal: Negative for arthralgias and back pain.  Skin: Negative Neurological: Negative for tremors, light-headedness and numbness.  Hematological: Does not bruise/bleed easily.  Psychiatric/Behavioral: Negative for agitation and behavioral problems.   Objective:  BP (!) 171/71 (BP Location: Left Arm, Patient Position: Sitting, Cuff Size: Large)   Pulse 76   Temp 98.2 F (36.8 C) (Oral)   Resp 18   Ht _0  (1.676 m)   Wt 197 lb (89.4 kg)   SpO2 99%   BMI 31.80 kg/m   BP/Weight 06/03/2016 05/04/2016 12/45/8099  Systolic BP 833 825 053  Diastolic BP 71 95 70  Wt. (Lbs) 197 190.6 167.6  BMI 31.8 30.76 27.05      Physical Exam Constitutional: She is oriented to person, place, and time. She appears well-developed and well-nourished.  Cardiovascular: Normal rate, normal heart sounds and intact distal pulses.   No murmur heard. 1+ bilateral pitting pedal edema Pulmonary/Chest: Effort normal and breath sounds normal. She has no wheezes. She has no rales. She exhibits no tenderness.  Abdominal: Soft. Bowel sounds are normal. She exhibits no distension and no mass. There is no tenderness.  Musculoskeletal: No tenderness to palpation of lumbar spine; tenderness in the lumbar spine on range of motion of hip.  Neurological: She is alert and oriented to person, place, and time.  Skin:  Skin is warm and dry.  Psychiatric: She has a normal mood and affect.    Lab Results  Component Value Date   HGBA1C 10.9 05/04/2016    Assessment & Plan:   1. Type 2 diabetes mellitus with hyperosmolarity without coma, without long-term current use of insulin (HCC) Uncontrolled with A1c of 10.9 This is secondary to noncompliance with dietary restrictions She has also been inconsistent with administration of NovoLog and has been advised on compliance - Glucose (CBG)  2. Essential hypertension Uncontrolled Switch from losartan to losartan/hydrochlorothiazide which should hopefully help with pedal edema Low-sodium diet - losartan-hydrochlorothiazide (HYZAAR) 100-25 MG tablet; Take 1 tablet by mouth daily.  Dispense: 30 tablet; Refill: 3  3. Acute midline low back pain without sciatica Cannot exclude underlying osteoarthritis Unable to give NSAIDs due to the fact that she is on Plavix Advised to use heating pad Use muscle relaxant as needed-discussed sedating side  effects - methocarbamol (ROBAXIN-750) 750 MG tablet; Take 1 tablet (750 mg total) by mouth every 8 (eight) hours as needed for muscle spasms.  Dispense: 90 tablet; Refill: 1  4. Pedal edema Likely dependent edema Elevate feet Compression socks Low-sodium diet Hopefully addition of hydrochlorothiazide will bring about some improvement   Meds ordered this encounter  Medications  . losartan-hydrochlorothiazide (HYZAAR) 100-25 MG tablet    Sig: Take 1 tablet by mouth daily.    Dispense:  30 tablet    Refill:  3    Discontinue losartan  . methocarbamol (ROBAXIN-750) 750 MG tablet    Sig: Take 1 tablet (750 mg total) by mouth every 8 (eight) hours as needed for muscle spasms.    Dispense:  90 tablet    Refill:  1    Follow-up: Return in about 6 weeks (around 07/15/2016) for Follow-up on hypertension and pedal edema.   Arnoldo Morale MD

## 2016-06-03 NOTE — Progress Notes (Signed)
Patient is here for DM/HTN  Patient denies pain at this time.  Patient has taken medication today. Patient has eaten today.

## 2016-06-03 NOTE — Patient Instructions (Signed)
Edema Edema is an abnormal buildup of fluids in your bodytissues. Edema is somewhatdependent on gravity to pull the fluid to the lowest place in your body. That makes the condition more common in the legs and thighs (lower extremities). Painless swelling of the feet and ankles is common and becomes more likely as you get older. It is also common in looser tissues, like around your eyes. When the affected area is squeezed, the fluid may move out of that spot and leave a dent for a few moments. This dent is called pitting. What are the causes? There are many possible causes of edema. Eating too much salt and being on your feet or sitting for a long time can cause edema in your legs and ankles. Hot weather may make edema worse. Common medical causes of edema include:  Heart failure.  Liver disease.  Kidney disease.  Weak blood vessels in your legs.  Cancer.  An injury.  Pregnancy.  Some medications.  Obesity.  What are the signs or symptoms? Edema is usually painless.Your skin may look swollen or shiny. How is this diagnosed? Your health care provider may be able to diagnose edema by asking about your medical history and doing a physical exam. You may need to have tests such as X-rays, an electrocardiogram, or blood tests to check for medical conditions that may cause edema. How is this treated? Edema treatment depends on the cause. If you have heart, liver, or kidney disease, you need the treatment appropriate for these conditions. General treatment may include:  Elevation of the affected body part above the level of your heart.  Compression of the affected body part. Pressure from elastic bandages or support stockings squeezes the tissues and forces fluid back into the blood vessels. This keeps fluid from entering the tissues.  Restriction of fluid and salt intake.  Use of a water pill (diuretic). These medications are appropriate only for some types of edema. They pull fluid  out of your body and make you urinate more often. This gets rid of fluid and reduces swelling, but diuretics can have side effects. Only use diuretics as directed by your health care provider.  Follow these instructions at home:  Keep the affected body part above the level of your heart when you are lying down.  Do not sit still or stand for prolonged periods.  Do not put anything directly under your knees when lying down.  Do not wear constricting clothing or garters on your upper legs.  Exercise your legs to work the fluid back into your blood vessels. This may help the swelling go down.  Wear elastic bandages or support stockings to reduce ankle swelling as directed by your health care provider.  Eat a low-salt diet to reduce fluid if your health care provider recommends it.  Only take medicines as directed by your health care provider. Contact a health care provider if:  Your edema is not responding to treatment.  You have heart, liver, or kidney disease and notice symptoms of edema.  You have edema in your legs that does not improve after elevating them.  You have sudden and unexplained weight gain. Get help right away if:  You develop shortness of breath or chest pain.  You cannot breathe when you lie down.  You develop pain, redness, or warmth in the swollen areas.  You have heart, liver, or kidney disease and suddenly get edema.  You have a fever and your symptoms suddenly get worse. This information is   not intended to replace advice given to you by your health care provider. Make sure you discuss any questions you have with your health care provider. Document Released: 01/03/2005 Document Revised: 06/11/2015 Document Reviewed: 10/26/2012 Elsevier Interactive Patient Education  2017 [employer]  

## 2016-06-14 MED FILL — BRIMONIDINE TARTRATE 0.15%: 0.15 | 10 days supply | Qty: 5 | Fill #1

## 2016-06-14 MED FILL — ?METFORMIN HCL 1,000 MG TAB: 1000 | 30 days supply | Qty: 60 | Fill #1

## 2016-06-16 ENCOUNTER — Telehealth: Payer: Self-pay | Admitting: Family Medicine

## 2016-06-16 MED ORDER — PROMETHAZINE HCL 25 MG PO TABS: 25.0000 mg | ORAL_TABLET | Freq: Three times a day (TID) | ORAL | 0 refills | Status: DC | PRN

## 2016-06-16 MED FILL — PROMETHAZINE 25 MG TABLET: 25 | 7 days supply | Qty: 20 | Fill #0

## 2016-06-16 NOTE — Telephone Encounter (Signed)
Pt aclled to advise that she is sick to her stomach with nausea and vomiting. Scheduled appt for 06/17/16 but would like to know what she may take OTC for her symptoms. Requests Dr Jarold Song or her CMA to call back. Thank you.

## 2016-06-16 NOTE — Telephone Encounter (Signed)
Prescription for promethazine sent to the pharmacy.

## 2016-06-17 ENCOUNTER — Encounter: Payer: Self-pay | Admitting: Family Medicine

## 2016-06-17 ENCOUNTER — Ambulatory Visit: Payer: BLUE CROSS/BLUE SHIELD | Attending: Family Medicine | Admitting: Family Medicine

## 2016-06-17 VITALS — BP 122/81 | HR 85 | Temp 98.1°F | Resp 16 | Wt 193.4 lb

## 2016-06-17 DIAGNOSIS — K529 Noninfective gastroenteritis and colitis, unspecified: Secondary | ICD-10-CM | POA: Diagnosis present

## 2016-06-17 DIAGNOSIS — R51 Headache: Secondary | ICD-10-CM | POA: Diagnosis not present

## 2016-06-17 DIAGNOSIS — R0982 Postnasal drip: Secondary | ICD-10-CM

## 2016-06-17 DIAGNOSIS — E1100 Type 2 diabetes mellitus with hyperosmolarity without nonketotic hyperglycemic-hyperosmolar coma (NKHHC): Secondary | ICD-10-CM

## 2016-06-17 DIAGNOSIS — R519 Headache, unspecified: Secondary | ICD-10-CM

## 2016-06-17 LAB — GLUCOSE, POCT (MANUAL RESULT ENTRY): POC GLUCOSE: 276 mg/dL — AB (ref 70–99)

## 2016-06-17 LAB — POCT GLYCOSYLATED HEMOGLOBIN (HGB A1C): Hemoglobin A1C: 10.6

## 2016-06-17 MED ORDER — CETIRIZINE HCL 10 MG PO TABS: 10.0000 mg | ORAL_TABLET | Freq: Every day | ORAL | 3 refills | Status: DC

## 2016-06-17 NOTE — Progress Notes (Signed)
Subjective:  Patient ID: Adrienne [name], female    DOB: 07-Jun-1955  Age: 61 y.o. MRN: [id number]  CC: Nausea; Vomiting; Headache; and Hypoglycemia   HPI Adrienne [name] is a 61 year old female with a history of uncontrolled type 2 diabetes mellitus (A1c 10.9 which has trended up from 7.9 previously), anemia, central retinal artery occlusion (Followed by [facility]), hypertension who presents today with a history of nausea and vomiting with associated intermittent headaches which commenced 4 days ago after she had pizza.  This was associated with some lightheadedness and a one day history of diarrhea. Vomiting lasted only 2 days after which she had dry heaves but reports that today she has been able to keep food down. Headaches have been intermittent and frontal and she occasionally has mucus at the back of her throat with postnasal drip. She informs me she did have a blood sugar of 68 and as a result 82 bananas, one piece of toast as well as bacon.  Past Medical History:  Diagnosis Date  . Diabetes ([place])   . High cholesterol   . Hypertension     Past Surgical History:  Procedure Laterality Date  . CATARACT EXTRACTION, BILATERAL     Laser eye surgery, cataract surgery bi-lat  . CESAREAN SECTION     Twins, [place]  . EYE SURGERY Bilateral 2012   LASER, CATARACT  . TEE WITHOUT CARDIOVERSION N/A 12/31/2013   Procedure: TRANSESOPHAGEAL ECHOCARDIOGRAM (TEE);  Surgeon: Laverda Page, MD;  Location: [facility];  Service: Cardiovascular;  Laterality: N/A;  H&P in file  . TUBAL LIGATION Bilateral     Allergies  Allergen Reactions  . Codeine Itching  . Hydrocodone Itching  . Niacin And Related Itching  . Oxycodone Itching  . Penicillin G Itching     Outpatient Medications Prior to Visit  Medication Sig Dispense Refill  . aspirin EC 81 MG tablet Take 81 mg by mouth daily.     Marland Kitchen atorvastatin (LIPITOR) 40 MG tablet TAKE 1 TABLET BY MOUTH DAILY. 90 tablet 0  .  brimonidine (ALPHAGAN) 0.15 % ophthalmic solution Place 1 drop into both eyes 2 (two) times daily.     . clopidogrel (PLAVIX) 75 MG tablet TAKE 1 TABLET BY MOUTH DAILY. 90 tablet 1  . dorzolamide-timolol (COSOPT) 22.3-6.8 MG/ML ophthalmic solution Place 1 drop into both eyes 2 (two) times daily.   2  . insulin aspart (NOVOLOG FLEXPEN) 100 UNIT/ML FlexPen Inject 8 Units into the skin 3 (three) times daily before meals. 15 mL 3  . Insulin Glargine (LANTUS SOLOSTAR) 100 UNIT/ML Solostar Pen Inject 40 Units into the skin daily at 10 pm. 5 pen 3  . losartan-hydrochlorothiazide (HYZAAR) 100-25 MG tablet Take 1 tablet by mouth daily. 30 tablet 3  . metFORMIN (GLUCOPHAGE) 1000 MG tablet TAKE 1 TABLET BY MOUTH 2 TIMES DAILY. 180 tablet 1  . methocarbamol (ROBAXIN) 500 MG tablet Take 2 tablets (1,000 mg total) by mouth every 12 (twelve) hours as needed for muscle spasms. 120 tablet 1  . metoCLOPramide (REGLAN) 10 MG tablet Take 1 tablet (10 mg total) by mouth every 8 (eight) hours as needed for nausea. 90 tablet 1  . promethazine (PHENERGAN) 25 MG tablet Take 1 tablet (25 mg total) by mouth every 8 (eight) hours as needed for nausea or vomiting. 20 tablet 0  . ranitidine (ZANTAC) 150 MG tablet Take 1 tablet (150 mg total) by mouth 2 (two) times daily. 60 tablet 1  . cetirizine (ZYRTEC) 10 MG tablet Take  1 tablet (10 mg total) by mouth daily. 30 tablet 3  . blood glucose meter kit and supplies Dispense based on patient and insurance preference. Use up to four times daily as directed. (FOR ICD-9 250.00, 250.01). 1 each 0  . Blood Glucose Monitoring Suppl (ONE TOUCH ULTRA SYSTEM KIT) w/Device KIT 1 kit by Does not apply route 3 (three) times daily. 1 each 0  . Blood Glucose Monitoring Suppl (TRUE METRIX METER) DEVI 1 each by Does not apply route 3 (three) times daily before meals. 1 Device 0  . glucose blood (ONE TOUCH ULTRA TEST) test strip Use as instructed 3 times daily before meals 100 each 12  . Insulin Pen  Needle 31G X 5 MM MISC 1 Act by Does not apply route 4 (four) times daily - after meals and at bedtime. 300 each 0   No facility-administered medications prior to visit.     ROS Review of Systems  Constitutional: Negative for activity change, appetite change and fatigue.  HENT: Negative for congestion, sinus pressure and sore throat.   Eyes: Positive for visual disturbance.  Respiratory: Negative for cough, chest tightness, shortness of breath and wheezing.   Cardiovascular: Negative for chest pain and palpitations.  Gastrointestinal:       See hpi  Endocrine: Negative for polydipsia.  Genitourinary: Negative for dysuria and frequency.  Musculoskeletal: Negative for arthralgias and back pain.  Skin: Negative for rash.  Neurological: Positive for headaches. Negative for tremors, light-headedness and numbness.  Hematological: Does not bruise/bleed easily.  Psychiatric/Behavioral: Negative for agitation and behavioral problems.    Objective:  BP 122/81 (BP Location: Right Arm, Patient Position: Sitting, Cuff Size: Large)   Pulse 85   Temp 98.1 F (36.7 C)   Resp 16   Wt 193 lb 6.4 oz (87.7 kg)   SpO2 100%   BMI 31.22 kg/m   BP/Weight 06/17/2016 06/03/2016 2/77/8242  Systolic BP 353 614 431  Diastolic BP 81 71 95  Wt. (Lbs) 193.4 197 190.6  BMI 31.22 31.8 30.76      Physical Exam Constitutional: She is oriented to person, place, and time. She appears well-developed and well-nourished.  Cardiovascular: Normal rate, normal heart sounds and intact distal pulses.   No murmur heard. 1+ bilateral pitting pedal edema Pulmonary/Chest: Effort normal and breath sounds normal. She has no wheezes. She has no rales. She exhibits no tenderness.  Abdominal: Soft. Bowel sounds are normal. She exhibits no distension and no mass. There is no tenderness.  Musculoskeletal: No tenderness to palpation of lumbar spine; tenderness in the lumbar spine on range of motion of hip.  Neurological: She  is alert and oriented to person, place, and time.  Skin: Skin is warm and dry.  Psychiatric: She has a normal mood and affect.    Lab Results  Component Value Date   HGBA1C 10.6 06/17/2016    Assessment & Plan:   1. Type 2 diabetes mellitus with hyperosmolarity without coma, without long-term current use of insulin (HCC) Uncontrolled with A1c of 10.6 Hemoglobin had been adjusted at her last office visit No regimen changed today - Glucose (CBG) - HgB A1c  2. Gastroenteritis Improving Promethazine had been sent to her pharmacy yesterday Advised on adequate hydration and BRAT diet - Basic Metabolic Panel  3. Sinus headache - cetirizine (ZYRTEC) 10 MG tablet; Take 1 tablet (10 mg total) by mouth daily.  Dispense: 30 tablet; Refill: 3  4. Post-nasal drip - cetirizine (ZYRTEC) 10 MG tablet; Take 1 tablet (  10 mg total) by mouth daily.  Dispense: 30 tablet; Refill: 3   Meds ordered this encounter  Medications  . cetirizine (ZYRTEC) 10 MG tablet    Sig: Take 1 tablet (10 mg total) by mouth daily.    Dispense:  30 tablet    Refill:  3    Follow-up: Return if symptoms worsen or fail to improve, for Follow-up of chronic medical conditions, keep previously scheduled appointment.   Arnoldo Morale MD

## 2016-06-18 LAB — BASIC METABOLIC PANEL
BUN / CREAT RATIO: 13 (ref 12–28)
BUN: 18 mg/dL (ref 8–27)
CHLORIDE: 96 mmol/L (ref 96–106)
CO2: 24 mmol/L (ref 18–29)
Calcium: 9.6 mg/dL (ref 8.7–10.3)
Creatinine, Ser: 1.36 mg/dL — ABNORMAL HIGH (ref 0.57–1.00)
GFR calc non Af Amer: 42 mL/min/{1.73_m2} — ABNORMAL LOW (ref >59)
GFR, EST AFRICAN AMERICAN: 48 mL/min/{1.73_m2} — AB (ref >59)
Glucose: 262 mg/dL — ABNORMAL HIGH (ref 65–99)
POTASSIUM: 4.4 mmol/L (ref 3.5–5.2)
SODIUM: 139 mmol/L (ref 134–144)

## 2016-06-29 MED FILL — ATORVASTATIN 40 MG TABLET: 40 | 30 days supply | Qty: 30 | Fill #1

## 2016-06-29 MED FILL — LOSARTAN POTASSIUM 50 MG TA: 50 | 30 days supply | Qty: 30 | Fill #2

## 2016-06-29 MED FILL — BRIMONIDINE TARTRATE 0.15%: 0.15 | 10 days supply | Qty: 5 | Fill #2

## 2016-06-29 MED FILL — LANTUS SOLOSTAR 100 UNITS/M: 100 | 30 days supply | Qty: 12 | Fill #2

## 2016-07-07 ENCOUNTER — Telehealth: Payer: Self-pay | Admitting: Family Medicine

## 2016-07-07 NOTE — Telephone Encounter (Signed)
I spoke to the patient on the phone, had a fasting blood sugar of 160 and took 3 units of Novolog. Complains of inability to see properly out of the left eye "feels like looking at the sun". 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 Advised to present to the ED. Her Ophthalmologist is at [facility].

## 2016-07-07 NOTE — Telephone Encounter (Signed)
Pt called to advise that blood sugars were elevated this morning when she woke up and now she is experiencing changes in her vision (states it is like looking at the sun and changes in peripheral vision) Requests call back from PCP  Amao or CMA. Please f/u with pt.

## 2016-07-18 MED FILL — ?METFORMIN HCL 1,000 MG TAB: 1000 | 30 days supply | Qty: 60 | Fill #2

## 2016-07-18 MED FILL — TRUE METRIX TEST STRIP: 15 days supply | Qty: 50 | Fill #4

## 2016-07-19 ENCOUNTER — Ambulatory Visit: Payer: BLUE CROSS/BLUE SHIELD | Attending: Family Medicine | Admitting: Family Medicine

## 2016-07-19 ENCOUNTER — Encounter: Payer: Self-pay | Admitting: Family Medicine

## 2016-07-19 VITALS — BP 168/96 | HR 72 | Temp 98.3°F | Resp 16 | Wt 196.0 lb

## 2016-07-19 DIAGNOSIS — Z880 Allergy status to penicillin: Secondary | ICD-10-CM | POA: Insufficient documentation

## 2016-07-19 DIAGNOSIS — Z794 Long term (current) use of insulin: Secondary | ICD-10-CM

## 2016-07-19 DIAGNOSIS — E1165 Type 2 diabetes mellitus with hyperglycemia: Secondary | ICD-10-CM | POA: Diagnosis not present

## 2016-07-19 DIAGNOSIS — Z7982 Long term (current) use of aspirin: Secondary | ICD-10-CM | POA: Insufficient documentation

## 2016-07-19 DIAGNOSIS — Z9889 Other specified postprocedural states: Secondary | ICD-10-CM | POA: Diagnosis not present

## 2016-07-19 DIAGNOSIS — E1169 Type 2 diabetes mellitus with other specified complication: Secondary | ICD-10-CM | POA: Diagnosis not present

## 2016-07-19 DIAGNOSIS — Z79899 Other long term (current) drug therapy: Secondary | ICD-10-CM | POA: Diagnosis not present

## 2016-07-19 DIAGNOSIS — H409 Unspecified glaucoma: Secondary | ICD-10-CM | POA: Diagnosis not present

## 2016-07-19 DIAGNOSIS — E7800 Pure hypercholesterolemia, unspecified: Secondary | ICD-10-CM | POA: Diagnosis not present

## 2016-07-19 DIAGNOSIS — R600 Localized edema: Secondary | ICD-10-CM | POA: Diagnosis not present

## 2016-07-19 DIAGNOSIS — D649 Anemia, unspecified: Secondary | ICD-10-CM | POA: Insufficient documentation

## 2016-07-19 DIAGNOSIS — H5461 Unqualified visual loss, right eye, normal vision left eye: Secondary | ICD-10-CM | POA: Insufficient documentation

## 2016-07-19 DIAGNOSIS — I10 Essential (primary) hypertension: Secondary | ICD-10-CM | POA: Diagnosis not present

## 2016-07-19 DIAGNOSIS — Z888 Allergy status to other drugs, medicaments and biological substances status: Secondary | ICD-10-CM | POA: Diagnosis not present

## 2016-07-19 DIAGNOSIS — H3411 Central retinal artery occlusion, right eye: Secondary | ICD-10-CM | POA: Diagnosis not present

## 2016-07-19 LAB — GLUCOSE, POCT (MANUAL RESULT ENTRY): POC GLUCOSE: 192 mg/dL — AB (ref 70–99)

## 2016-07-19 MED ORDER — PRODIGY VOICE BLOOD GLUCOSE W/DEVICE KIT: PACK | 0 refills | Status: DC

## 2016-07-19 MED ORDER — GLUCOSE BLOOD VI STRP: ORAL_STRIP | 12 refills | Status: DC

## 2016-07-19 MED ORDER — INSULIN ASPART 100 UNIT/ML FLEXPEN: 0.0000 [IU] | PEN_INJECTOR | Freq: Three times a day (TID) | SUBCUTANEOUS | 3 refills | Status: DC

## 2016-07-19 NOTE — Progress Notes (Signed)
Subjective:  Patient ID: Adrienne [name], female    DOB: [birth year]  Age: 61 y.o. MRN: [id number]  CC: Follow-up   HPI  Adrienne [name] is a 61 year old female with a history of uncontrolled type 2 diabetes mellitus (A1c 10.9 which has trended up from 7.9 previously), anemia, central retinal artery occlusion (Followed by [facility]), hypertension who comes into the clinic for follow-up of hypertension and pedal edema.   Her blood pressure is elevated and she endorses not taking one of her medications as she was unsure of the indication and was unable to read the label on the bottle. She is assuming that might have been her antihypertensive.  She also has pedal edema and complains that this is a result of working long hours at [employer] where she sits with her feet in the dependent position.  She was recently seen at the [facility] for complains of flashes of light in her left eye and there was concern for vascular etiology. She has an upcoming appointment in 2 weeks.  Past Medical History:  Diagnosis Date  . Diabetes ([place])   . High cholesterol   . Hypertension     Past Surgical History:  Procedure Laterality Date  . CATARACT EXTRACTION, BILATERAL     Laser eye surgery, cataract surgery bi-lat  . CESAREAN SECTION     Twins, Breech  . EYE SURGERY Bilateral 2012   LASER, CATARACT  . TEE WITHOUT CARDIOVERSION N/A 12/31/2013   Procedure: TRANSESOPHAGEAL ECHOCARDIOGRAM (TEE);  Surgeon: Laverda Page, MD;  Location: [facility];  Service: Cardiovascular;  Laterality: N/A;  H&P in file  . TUBAL LIGATION Bilateral     Allergies  Allergen Reactions  . Codeine Itching  . Hydrocodone Itching  . Niacin And Related Itching  . Oxycodone Itching  . Penicillin G Itching     Outpatient Medications Prior to Visit  Medication Sig Dispense Refill  . aspirin EC 81 MG tablet Take 81 mg by mouth daily.     Marland Kitchen atorvastatin (LIPITOR) 40 MG  tablet TAKE 1 TABLET BY MOUTH DAILY. 90 tablet 0  . blood glucose meter kit and supplies Dispense based on patient and insurance preference. Use up to four times daily as directed. (FOR ICD-9 250.00, 250.01). 1 each 0  . brimonidine (ALPHAGAN) 0.15 % ophthalmic solution Place 1 drop into both eyes 2 (two) times daily.     . cetirizine (ZYRTEC) 10 MG tablet Take 1 tablet (10 mg total) by mouth daily. 30 tablet 3  . clopidogrel (PLAVIX) 75 MG tablet TAKE 1 TABLET BY MOUTH DAILY. 90 tablet 1  . dorzolamide-timolol (COSOPT) 22.3-6.8 MG/ML ophthalmic solution Place 1 drop into both eyes 2 (two) times daily.   2  . Insulin Glargine (LANTUS SOLOSTAR) 100 UNIT/ML Solostar Pen Inject 40 Units into the skin daily at 10 pm. 5 pen 3  . Insulin Pen Needle 31G X 5 MM MISC 1 Act by Does not apply route 4 (four) times daily - after meals and at bedtime. 300 each 0  . losartan-hydrochlorothiazide (HYZAAR) 100-25 MG tablet Take 1 tablet by mouth daily. 30 tablet 3  . metFORMIN (GLUCOPHAGE) 1000 MG tablet TAKE 1 TABLET BY MOUTH 2 TIMES DAILY. 180 tablet 1  . methocarbamol (ROBAXIN) 500 MG tablet Take 2 tablets (1,000 mg total) by mouth every 12 (twelve) hours as needed for muscle spasms. 120 tablet 1  . metoCLOPramide (REGLAN) 10 MG tablet Take 1 tablet (10 mg total) by  mouth every 8 (eight) hours as needed for nausea. 90 tablet 1  . promethazine (PHENERGAN) 25 MG tablet Take 1 tablet (25 mg total) by mouth every 8 (eight) hours as needed for nausea or vomiting. 20 tablet 0  . Blood Glucose Monitoring Suppl (TRUE METRIX METER) DEVI 1 each by Does not apply route 3 (three) times daily before meals. 1 Device 0  . glucose blood (ONE TOUCH ULTRA TEST) test strip Use as instructed 3 times daily before meals 100 each 12  . insulin aspart (NOVOLOG FLEXPEN) 100 UNIT/ML FlexPen Inject 8 Units into the skin 3 (three) times daily before meals. 15 mL 3  . ranitidine (ZANTAC) 150 MG tablet Take 1 tablet (150 mg total) by mouth 2  (two) times daily. (Patient not taking: Reported on 07/19/2016) 60 tablet 1  . Blood Glucose Monitoring Suppl (ONE TOUCH ULTRA SYSTEM KIT) w/Device KIT 1 kit by Does not apply route 3 (three) times daily. 1 each 0   No facility-administered medications prior to visit.     ROS Review of Systems Constitutional: Negative for activity change, appetite change and fatigue.  HENT: Negative for congestion, sinus pressure and sore throat.   Eyes: Positive for visual disturbance.  blind in the right eye Respiratory: Negative for cough, chest tightness, shortness of breath and wheezing.   Cardiovascular: Negative for chest pain and palpitations.  Gastrointestinal:       See hpi  Endocrine: Negative for polydipsia.  Genitourinary: Negative for dysuria and frequency.  Musculoskeletal: Negative for arthralgias and back pain.  Skin: Negative for rash.  Neurological: . Negative for tremors, light-headedness and numbness.  Hematological: Does not bruise/bleed easily.  Psychiatric/Behavioral: Negative for agitation and behavioral problems.   Objective:  BP (!) 168/96 (BP Location: Left Arm)   Pulse 72   Temp 98.3 F (36.8 C)   Resp 16   Wt 196 lb (88.9 kg)   SpO2 99%   BMI 31.64 kg/m   BP/Weight 07/19/2016 06/17/2016 9/52/8413  Systolic BP 244 010 272  Diastolic BP 96 81 71  Wt. (Lbs) 196 193.4 197  BMI 31.64 31.22 31.8      Physical Exam Constitutional: She is oriented to person, place, and time. She appears well-developed and well-nourished.  Cardiovascular: Normal rate, normal heart sounds and intact distal pulses.   No murmur heard. 1+ bilateral pitting pedal edema Pulmonary/Chest: Effort normal and breath sounds normal. She has no wheezes. She has no rales. She exhibits no tenderness.  Abdominal: Soft. Bowel sounds are normal. She exhibits no distension and no mass. There is no tenderness.  Musculoskeletal: No tenderness to palpation of lumbar spine; tenderness in the lumbar spine on  range of motion of hip.  Neurological: She is alert and oriented to person, place, and time.  Skin: Skin is warm and dry.  Psychiatric: She has a normal mood and affect.   Lab Results  Component Value Date   HGBA1C 10.6 06/17/2016     Assessment & Plan:   1. Essential hypertension Uncontrolled She has not been taking her antihypertensive due to visual impairment Advised to inform family members so the pillbox can be filled for her to aid compliance  2. Pedal edema Likely dependent This should improve once she resumes her antihypertensives but does have diuretic Advised to also use compression stockings  3. Type 2 diabetes mellitus with other specified complication, with long-term current use of insulin (HCC) Uncontrolled with A1c of 10.1 Continue Lantus 40 units I have provided her with  sliding scale of NovoLog to ensure she is compliant as she sometimes skips her NovoLog based on her discretion Provided with a voice glucometer - Glucose (CBG) - insulin aspart (NOVOLOG FLEXPEN) 100 UNIT/ML FlexPen; Inject 0-12 Units into the skin 3 (three) times daily before meals. As per sliding scale  Dispense: 15 mL; Refill: 3  4. 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 Blood Glucose Monitoring Suppl (PRODIGY VOICE BLOOD GLUCOSE) w/Device KIT    Sig: Use as directed    Dispense:  1 kit    Refill:  0  . glucose blood test strip    Sig: Use as instructed    Dispense:  100 each    Refill:  12    Pro[facility] voice test strips  . insulin aspart (NOVOLOG FLEXPEN) 100 UNIT/ML FlexPen    Sig: Inject 0-12 Units into the skin 3 (three) times daily before meals. As per sliding scale    Dispense:  15 mL    Refill:  3    Follow-up: Return in about 3 months (around 10/19/2016) for follow up on Diabetes.   This note has been created with Scientist, clinical (histocompatibility and immunogenetics). Any transcriptional errors are unintentional.     Arnoldo Morale MD

## 2016-07-28 MED FILL — LOSARTAN-HCTZ 100-25 MG TAB: 100-25 | 30 days supply | Qty: 30 | Fill #1

## 2016-07-28 MED FILL — DORZOLAMIDE-TIMOLOL EYE DRP: 22.3-6.8 | 50 days supply | Qty: 10 | Fill #1

## 2016-07-28 MED FILL — LANTUS SOLOSTAR 100 UNITS/M: 100 | 30 days supply | Qty: 12 | Fill #3

## 2016-07-28 MED FILL — ATORVASTATIN 40 MG TABLET: 40 | 30 days supply | Qty: 30 | Fill #2

## 2016-08-15 ENCOUNTER — Other Ambulatory Visit: Payer: Self-pay | Admitting: Family Medicine

## 2016-08-15 DIAGNOSIS — E7800 Pure hypercholesterolemia, unspecified: Secondary | ICD-10-CM

## 2016-08-15 MED FILL — BRIMONIDINE TARTRATE 0.15%: 0.15 | 40 days supply | Qty: 5 | Fill #2

## 2016-08-15 MED FILL — raNITIdine HCL 150 MG TABS: 150 | 30 days supply | Qty: 60 | Fill #1

## 2016-08-15 MED FILL — CLOPIDOGREL 75 MG TABLET: 75 | 90 days supply | Qty: 90 | Fill #1

## 2016-08-16 MED FILL — metFORMIN HCL 1000 MG TABS: 1000 | 30 days supply | Qty: 60 | Fill #3

## 2016-08-29 ENCOUNTER — Ambulatory Visit: Payer: BLUE CROSS/BLUE SHIELD | Attending: Family Medicine | Admitting: Physician Assistant

## 2016-08-29 VITALS — BP 133/82 | HR 89 | Temp 98.2°F | Resp 16 | Wt 194.8 lb

## 2016-08-29 DIAGNOSIS — I10 Essential (primary) hypertension: Secondary | ICD-10-CM | POA: Diagnosis not present

## 2016-08-29 DIAGNOSIS — E1169 Type 2 diabetes mellitus with other specified complication: Secondary | ICD-10-CM

## 2016-08-29 DIAGNOSIS — E1143 Type 2 diabetes mellitus with diabetic autonomic (poly)neuropathy: Secondary | ICD-10-CM | POA: Insufficient documentation

## 2016-08-29 DIAGNOSIS — Z79899 Other long term (current) drug therapy: Secondary | ICD-10-CM | POA: Diagnosis not present

## 2016-08-29 DIAGNOSIS — R112 Nausea with vomiting, unspecified: Secondary | ICD-10-CM | POA: Diagnosis present

## 2016-08-29 DIAGNOSIS — Z794 Long term (current) use of insulin: Secondary | ICD-10-CM | POA: Diagnosis not present

## 2016-08-29 DIAGNOSIS — K3184 Gastroparesis: Secondary | ICD-10-CM

## 2016-08-29 DIAGNOSIS — Z7982 Long term (current) use of aspirin: Secondary | ICD-10-CM | POA: Diagnosis not present

## 2016-08-29 LAB — POCT CBG (FASTING - GLUCOSE)-MANUAL ENTRY: Glucose Fasting, POC: 233 mg/dL — AB (ref 70–99)

## 2016-08-29 MED ORDER — INSULIN GLARGINE 100 UNIT/ML SOLOSTAR PEN: 50.0000 [IU] | PEN_INJECTOR | Freq: Every day | SUBCUTANEOUS | 3 refills | Status: DC

## 2016-08-29 MED ORDER — METOCLOPRAMIDE HCL 10 MG PO TABS: 10.0000 mg | ORAL_TABLET | Freq: Three times a day (TID) | ORAL | 1 refills | Status: DC | PRN

## 2016-08-29 MED FILL — METOCLOPRAMIDE 10 MG TABLET: 10 | 30 days supply | Qty: 90 | Fill #0

## 2016-08-29 NOTE — Progress Notes (Signed)
Patient ID: Adrienne [name], female   DOB: 09/09/1955, 61 y.o.   MRN: [id number]   Adrienne [name], is a 61 y.o. female  AYT:[id number]  [id number]  DOB - 1955-11-27  Subjective:  Chief Complaint and HPI: Adrienne [name] is a 61 y.o. female here today for nausea with some morning vomiting and feeling as though food is getting stuck in her throat for < 1week.  She has had <1 vomiting episode in the mornings over the last week daily.  She has a h/o gastroparesis with hospitalization about  A year ago.  Says blood sugars running in mid 200s at home.  Denies hyper/hypoglycemia.  Only takes novolog 1-2 times a day.  Says she just isn't realistically going to take it when she is working.  Today she is feeling a little weak.  No CP/SOB.  No abdominal pain.  No f/c.  No changes in BM.  She does feel similar to when she had gastroparesis in the past, but not as bad.   ROS:   Constitutional:  No f/c, No night sweats, No unexplained weight loss. EENT:  No vision changes, No blurry vision, No hearing changes. No mouth, throat, or ear problems.  Respiratory: No cough, No SOB Cardiac: No CP, no palpitations GI:  No abd pain, No D/C. +nausea, mild vomiting GU: No Urinary s/sx Musculoskeletal: No joint pain Neuro: No headache, no dizziness, no motor weakness.  Skin: No rash Endocrine:  No polydipsia. No polyuria.  Psych: Denies SI/HI  No problems updated.  ALLERGIES: Allergies  Allergen Reactions  . Codeine Itching  . Hydrocodone Itching  . Niacin And Related Itching  . Oxycodone Itching  . Penicillin G Itching    PAST MEDICAL HISTORY: Past Medical History:  Diagnosis Date  . Diabetes ([place])   . High cholesterol   . Hypertension     MEDICATIONS AT HOME: Prior to Admission medications   Medication Sig Start Date End Date Taking? Authorizing Provider  aspirin EC 81 MG tablet Take 81 mg by mouth daily.    Yes [provider]  atorvastatin (LIPITOR) 40 MG tablet TAKE 1  TABLET BY MOUTH DAILY. 08/15/16  Yes Arnoldo Morale, MD  blood glucose meter kit and supplies Dispense based on patient and insurance preference. Use up to four times daily as directed. (FOR ICD-9 250.00, 250.01). 09/08/15  Yes Lavina Hamman, MD  Blood Glucose Monitoring Suppl (PRODIGY VOICE BLOOD GLUCOSE) w/Device KIT Use as directed 07/19/16  Yes Amao, Charlane Ferretti, MD  brimonidine (ALPHAGAN) 0.15 % ophthalmic solution Place 1 drop into both eyes 2 (two) times daily.  05/20/15  Yes [provider]  clopidogrel (PLAVIX) 75 MG tablet TAKE 1 TABLET BY MOUTH DAILY. 05/26/16  Yes Arnoldo Morale, MD  dorzolamide-timolol (COSOPT) 22.3-6.8 MG/ML ophthalmic solution Place 1 drop into both eyes 2 (two) times daily.  08/05/15  Yes [provider]  glucose blood test strip Use as instructed 07/19/16  Yes Amao, Enobong, MD  insulin aspart (NOVOLOG FLEXPEN) 100 UNIT/ML FlexPen Inject 0-12 Units into the skin 3 (three) times daily before meals. As per sliding scale 07/19/16  Yes Arnoldo Morale, MD  Insulin Glargine (LANTUS SOLOSTAR) 100 UNIT/ML Solostar Pen Inject 50 Units into the skin daily at 10 pm. 08/29/16  Yes McClung, Dionne Bucy, PA-C  Insulin Pen Needle 31G X 5 MM MISC 1 Act by Does not apply route 4 (four) times daily - after meals and at bedtime. 09/08/15  Yes Lavina Hamman, MD  losartan-hydrochlorothiazide (HYZAAR) 100-25 MG  tablet Take 1 tablet by mouth daily. 06/03/16  Yes Arnoldo Morale, MD  metFORMIN (GLUCOPHAGE) 1000 MG tablet TAKE 1 TABLET BY MOUTH 2 TIMES DAILY. 05/11/16  Yes Arnoldo Morale, MD  methocarbamol (ROBAXIN) 500 MG tablet Take 2 tablets (1,000 mg total) by mouth every 12 (twelve) hours as needed for muscle spasms. 06/03/16  Yes Arnoldo Morale, MD  ranitidine (ZANTAC) 150 MG tablet Take 1 tablet (150 mg total) by mouth 2 (two) times daily. 02/10/16  Yes Arnoldo Morale, MD  cetirizine (ZYRTEC) 10 MG tablet Take 1 tablet (10 mg total) by mouth daily. Patient not taking: Reported on 08/29/2016 06/17/16    Arnoldo Morale, MD  metoCLOPramide (REGLAN) 10 MG tablet Take 1 tablet (10 mg total) by mouth every 8 (eight) hours as needed for nausea. 08/29/16   Argentina Donovan, PA-C  promethazine (PHENERGAN) 25 MG tablet Take 1 tablet (25 mg total) by mouth every 8 (eight) hours as needed for nausea or vomiting. Patient not taking: Reported on 08/29/2016 06/16/16   Arnoldo Morale, MD     Objective:  EXAM:   Vitals:   08/29/16 1551  BP: 133/82  Pulse: 89  Resp: 16  Temp: 98.2 F (36.8 C)  SpO2: 96%  Weight: 194 lb 12.8 oz (88.4 kg)    General appearance : A&OX3. NAD. Non-toxic-appearing HEENT: Atraumatic and Normocephalic.  PERRLA. EOM intact.  TM clear B. Mouth-MMM, post pharynx WNL w/o erythema, No PND. Neck: supple, no JVD. No cervical lymphadenopathy. No thyromegaly Chest/Lungs:  Breathing-non-labored, Good air entry bilaterally, breath sounds normal without rales, rhonchi, or wheezing  CVS: S1 S2 regular, no murmurs, gallops, rubs  Abdomen: Bowel sounds present, Non tender and not distended with no gaurding, rigidity or rebound. Extremities: Bilateral Lower Ext shows no edema, both legs are warm to touch with = pulse throughout Neurology:  CN II-XII grossly intact, Non focal.   Psych:  TP linear. J/I WNL. Normal speech. Appropriate eye contact and affect.  Skin:  No Rash  Data Review Lab Results  Component Value Date   HGBA1C 10.6 06/17/2016   HGBA1C 10.9 05/04/2016   HGBA1C 7.9 12/15/2015     Assessment & Plan   1. Type 2 diabetes mellitus with other specified complication, with long-term current use of insulin (HCC) Uncontrolled.  Increase lantus.  I question her compliance with checking her blood sugars and taking meds as directed based on previous A1C and not able to give me exact numbers from when she is checking her sugar at home. - Glucose (CBG), Fasting - Insulin Glargine (LANTUS SOLOSTAR) 100 UNIT/ML Solostar Pen; Inject 50 Units into the skin daily at 10 pm.   Dispense: 5 pen; Refill: 3 - Comprehensive metabolic panel Increase water intake is imperative.  If she worsens, I have instructed her to go to the ED.  2. Gastroparesis is likely what is causing her N/V secondary to poor blood sugar control. Restart- - metoCLOPramide (REGLAN) 10 MG tablet; Take 1 tablet (10 mg total) by mouth every 8 (eight) hours as needed for nausea.  Dispense: 90 tablet; Refill: 1  Patient have been counseled extensively about nutrition and exercise  Return in about 2 weeks (around 09/12/2016) for Dr Jarold Song; f/up DM and gastroparesis.  The patient was given clear instructions to go to ER or return to medical center if symptoms don't improve, worsen or new problems develop. The patient verbalized understanding. The patient was told to call to get lab results if they haven't heard anything in the next  week.     Freeman Caldron, PA-C [facility] [place], [place]   08/29/2016, 4:38 PM

## 2016-08-29 NOTE — Progress Notes (Signed)
Not feeling well N/V x 1 week Feels like a lump in throat and has cough

## 2016-08-30 LAB — COMPREHENSIVE METABOLIC PANEL
ALK PHOS: 105 IU/L (ref 39–117)
ALT: 13 IU/L (ref 0–32)
AST: 14 IU/L (ref 0–40)
Albumin/Globulin Ratio: 1.5 (ref 1.2–2.2)
Albumin: 4.1 g/dL (ref 3.6–4.8)
BUN/Creatinine Ratio: 18 (ref 12–28)
BUN: 21 mg/dL (ref 8–27)
Bilirubin Total: 0.2 mg/dL (ref 0.0–1.2)
CO2: 24 mmol/L (ref 20–29)
CREATININE: 1.15 mg/dL — AB (ref 0.57–1.00)
Calcium: 9.2 mg/dL (ref 8.7–10.3)
Chloride: 99 mmol/L (ref 96–106)
GFR calc Af Amer: 59 mL/min/{1.73_m2} — ABNORMAL LOW (ref >59)
GFR calc non Af Amer: 51 mL/min/{1.73_m2} — ABNORMAL LOW (ref >59)
GLUCOSE: 184 mg/dL — AB (ref 65–99)
Globulin, Total: 2.7 g/dL (ref 1.5–4.5)
Potassium: 4.3 mmol/L (ref 3.5–5.2)
SODIUM: 137 mmol/L (ref 134–144)
Total Protein: 6.8 g/dL (ref 6.0–8.5)

## 2016-09-01 ENCOUNTER — Telehealth: Payer: Self-pay | Admitting: *Deleted

## 2016-09-01 MED FILL — LANTUS SOLOSTAR 100 UNITS/M: 100 | 30 days supply | Qty: 15 | Fill #0

## 2016-09-01 NOTE — Telephone Encounter (Signed)
Notes recorded by Argentina Donovan, PA-C on 08/30/2016 at 8:44 AM EDT Please call patient and let her know that her kidney function is impaired likely because of uncontrolled blood sugars. It is a little better than 2 months ago, but still not normal. Work on hydration and blood sugar control as we discussed. Her liver function and electrolytes are good. Follow-up as planned.   Left message to return call on voicemail.

## 2016-09-02 NOTE — Telephone Encounter (Signed)
Left message on voicemail.  Will mail letter for return call.

## 2016-09-05 NOTE — Telephone Encounter (Signed)
Left message on voicemail for patient to call back. 

## 2016-09-05 NOTE — Telephone Encounter (Signed)
Pt aware of results by A.Magdalen Spatz on Friday 09/02/2016

## 2016-09-12 MED FILL — DORZOLAMIDE-TIMOLOL EYE DRP: 22.3-6.8 | 37 days supply | Qty: 10 | Fill #2

## 2016-09-12 MED FILL — LOSARTAN-HCTZ 100-25 MG TAB: 100-25 | 30 days supply | Qty: 30 | Fill #2

## 2016-09-12 MED FILL — ATORVASTATIN 40 MG TABLET: 40 | 30 days supply | Qty: 30 | Fill #0

## 2016-09-12 MED FILL — metFORMIN HCL 1000 MG TABS: 1000 | 30 days supply | Qty: 60 | Fill #4

## 2016-09-27 ENCOUNTER — Other Ambulatory Visit: Payer: Self-pay | Admitting: Family Medicine

## 2016-09-27 DIAGNOSIS — H3411 Central retinal artery occlusion, right eye: Secondary | ICD-10-CM

## 2016-09-27 MED FILL — METOCLOPRAMIDE 10 MG TABLET: 10 | 30 days supply | Qty: 90 | Fill #1

## 2016-09-27 MED FILL — raNITIdine HCL 150 MG TABS: 150 | 30 days supply | Qty: 60 | Fill #0

## 2016-09-27 MED FILL — LANTUS SOLOSTAR 100 UNITS/M: 100 | 30 days supply | Qty: 15 | Fill #1

## 2016-10-17 ENCOUNTER — Ambulatory Visit: Payer: BLUE CROSS/BLUE SHIELD | Admitting: Licensed Clinical Social Worker

## 2016-10-17 ENCOUNTER — Ambulatory Visit: Payer: BLUE CROSS/BLUE SHIELD | Attending: Family Medicine | Admitting: Internal Medicine

## 2016-10-17 ENCOUNTER — Encounter: Payer: Self-pay | Admitting: Internal Medicine

## 2016-10-17 VITALS — BP 125/72 | HR 84 | Temp 98.2°F | Resp 18 | Ht 66.0 in | Wt 195.0 lb

## 2016-10-17 DIAGNOSIS — Z79899 Other long term (current) drug therapy: Secondary | ICD-10-CM | POA: Diagnosis not present

## 2016-10-17 DIAGNOSIS — H409 Unspecified glaucoma: Secondary | ICD-10-CM | POA: Diagnosis not present

## 2016-10-17 DIAGNOSIS — Z23 Encounter for immunization: Secondary | ICD-10-CM

## 2016-10-17 DIAGNOSIS — Z794 Long term (current) use of insulin: Secondary | ICD-10-CM | POA: Insufficient documentation

## 2016-10-17 DIAGNOSIS — E7849 Other hyperlipidemia: Secondary | ICD-10-CM | POA: Insufficient documentation

## 2016-10-17 DIAGNOSIS — E1169 Type 2 diabetes mellitus with other specified complication: Secondary | ICD-10-CM | POA: Diagnosis not present

## 2016-10-17 DIAGNOSIS — E1165 Type 2 diabetes mellitus with hyperglycemia: Secondary | ICD-10-CM | POA: Diagnosis not present

## 2016-10-17 DIAGNOSIS — H3411 Central retinal artery occlusion, right eye: Secondary | ICD-10-CM | POA: Insufficient documentation

## 2016-10-17 DIAGNOSIS — E119 Type 2 diabetes mellitus without complications: Secondary | ICD-10-CM | POA: Diagnosis present

## 2016-10-17 DIAGNOSIS — D649 Anemia, unspecified: Secondary | ICD-10-CM | POA: Diagnosis not present

## 2016-10-17 DIAGNOSIS — Z7982 Long term (current) use of aspirin: Secondary | ICD-10-CM | POA: Insufficient documentation

## 2016-10-17 DIAGNOSIS — F439 Reaction to severe stress, unspecified: Secondary | ICD-10-CM

## 2016-10-17 DIAGNOSIS — Z7902 Long term (current) use of antithrombotics/antiplatelets: Secondary | ICD-10-CM | POA: Diagnosis not present

## 2016-10-17 DIAGNOSIS — E7800 Pure hypercholesterolemia, unspecified: Secondary | ICD-10-CM | POA: Insufficient documentation

## 2016-10-17 DIAGNOSIS — I10 Essential (primary) hypertension: Secondary | ICD-10-CM

## 2016-10-17 LAB — GLUCOSE, POCT (MANUAL RESULT ENTRY): POC GLUCOSE: 88 mg/dL (ref 70–99)

## 2016-10-17 LAB — POCT GLYCOSYLATED HEMOGLOBIN (HGB A1C): Hemoglobin A1C: 9.7

## 2016-10-17 MED ORDER — LOSARTAN POTASSIUM-HCTZ 100-25 MG PO TABS: 1.0000 | ORAL_TABLET | Freq: Every day | ORAL | 3 refills | Status: DC

## 2016-10-17 MED ORDER — METFORMIN HCL 1000 MG PO TABS: 1000.0000 mg | ORAL_TABLET | Freq: Two times a day (BID) | ORAL | 3 refills | Status: DC

## 2016-10-17 MED ORDER — INSULIN GLARGINE 100 UNIT/ML SOLOSTAR PEN: 50.0000 [IU] | PEN_INJECTOR | Freq: Every day | SUBCUTANEOUS | 3 refills | Status: DC

## 2016-10-17 MED ORDER — ATORVASTATIN CALCIUM 40 MG PO TABS: 40.0000 mg | ORAL_TABLET | Freq: Every day | ORAL | 3 refills | Status: DC

## 2016-10-17 MED ORDER — CLOPIDOGREL BISULFATE 75 MG PO TABS: 75.0000 mg | ORAL_TABLET | Freq: Every day | ORAL | 3 refills | Status: DC

## 2016-10-17 MED ORDER — INSULIN ASPART 100 UNIT/ML FLEXPEN: 0.0000 [IU] | PEN_INJECTOR | Freq: Three times a day (TID) | SUBCUTANEOUS | 3 refills | Status: DC

## 2016-10-17 MED FILL — metFORMIN HCL 1000 MG TABS: 1000 | 30 days supply | Qty: 60 | Fill #5

## 2016-10-17 NOTE — Patient Instructions (Signed)
DASH Eating Plan DASH stands for "Dietary Approaches to Stop Hypertension." The DASH eating plan is a healthy eating plan that has been shown to reduce high blood pressure (hypertension). It may also reduce your risk for type 2 diabetes, heart disease, and stroke. The DASH eating plan may also help with weight loss. What are tips for following this plan? General guidelines  Avoid eating more than 2,300 mg (milligrams) of salt (sodium) a day. If you have hypertension, you may need to reduce your sodium intake to 1,500 mg a day.  Limit alcohol intake to no more than 1 drink a day for nonpregnant women and 2 drinks a day for men. One drink equals 12 oz of beer, 5 oz of wine, or 1 oz of hard liquor.  Work with your health care provider to maintain a healthy body weight or to lose weight. Ask what an ideal weight is for you.  Get at least 30 minutes of exercise that causes your heart to beat faster (aerobic exercise) most days of the week. Activities may include walking, swimming, or biking.  Work with your health care provider or diet and nutrition specialist (dietitian) to adjust your eating plan to your individual calorie needs. Reading food labels  Check food labels for the amount of sodium per serving. Choose foods with less than 5 percent of the Daily Value of sodium. Generally, foods with less than 300 mg of sodium per serving fit into this eating plan.  To find whole grains, look for the word "whole" as the first word in the ingredient list. Shopping  Buy products labeled as "low-sodium" or "no salt added."  Buy fresh foods. Avoid canned foods and premade or frozen meals. Cooking  Avoid adding salt when cooking. Use salt-free seasonings or herbs instead of table salt or sea salt. Check with your health care provider or pharmacist before using salt substitutes.  Do not fry foods. Cook foods using healthy methods such as baking, boiling, grilling, and broiling instead.  Cook with  heart-healthy oils, such as olive, canola, soybean, or sunflower oil. Meal planning   Eat a balanced diet that includes: ? 5 or more servings of fruits and vegetables each day. At each meal, try to fill half of your plate with fruits and vegetables. ? Up to 6-8 servings of whole grains each day. ? Less than 6 oz of lean meat, poultry, or fish each day. A 3-oz serving of meat is about the same size as a deck of cards. One egg equals 1 oz. ? 2 servings of low-fat dairy each day. ? A serving of nuts, seeds, or beans 5 times each week. ? Heart-healthy fats. Healthy fats called Omega-3 fatty acids are found in foods such as flaxseeds and coldwater fish, like sardines, salmon, and mackerel.  Limit how much you eat of the following: ? Canned or prepackaged foods. ? Food that is high in trans fat, such as fried foods. ? Food that is high in saturated fat, such as fatty meat. ? Sweets, desserts, sugary drinks, and other foods with added sugar. ? Full-fat dairy products.  Do not salt foods before eating.  Try to eat at least 2 vegetarian meals each week.  Eat more home-cooked food and less restaurant, buffet, and fast food.  When eating at a restaurant, ask that your food be prepared with less salt or no salt, if possible. What foods are recommended? The items listed may not be a complete list. Talk with your dietitian about what   dietary choices are best for you. Grains Whole-grain or whole-wheat bread. Whole-grain or whole-wheat pasta. Brown rice. Oatmeal. Quinoa. Bulgur. Whole-grain and low-sodium cereals. Pita bread. Low-fat, low-sodium crackers. Whole-wheat flour tortillas. Vegetables Fresh or frozen vegetables (raw, steamed, roasted, or grilled). Low-sodium or reduced-sodium tomato and vegetable juice. Low-sodium or reduced-sodium tomato sauce and tomato paste. Low-sodium or reduced-sodium canned vegetables. Fruits All fresh, dried, or frozen fruit. Canned fruit in natural juice (without  added sugar). Meat and other protein foods Skinless chicken or turkey. Ground chicken or turkey. Pork with fat trimmed off. Fish and seafood. Egg whites. Dried beans, peas, or lentils. Unsalted nuts, nut butters, and seeds. Unsalted canned beans. Lean cuts of beef with fat trimmed off. Low-sodium, lean deli meat. Dairy Low-fat (1%) or fat-free (skim) milk. Fat-free, low-fat, or reduced-fat cheeses. Nonfat, low-sodium ricotta or cottage cheese. Low-fat or nonfat yogurt. Low-fat, low-sodium cheese. Fats and oils Soft margarine without trans fats. Vegetable oil. Low-fat, reduced-fat, or light mayonnaise and salad dressings (reduced-sodium). Canola, safflower, olive, soybean, and sunflower oils. Avocado. Seasoning and other foods Herbs. Spices. Seasoning mixes without salt. Unsalted popcorn and pretzels. Fat-free sweets. What foods are not recommended? The items listed may not be a complete list. Talk with your dietitian about what dietary choices are best for you. Grains Baked goods made with fat, such as croissants, muffins, or some breads. Dry pasta or rice meal packs. Vegetables Creamed or fried vegetables. Vegetables in a cheese sauce. Regular canned vegetables (not low-sodium or reduced-sodium). Regular canned tomato sauce and paste (not low-sodium or reduced-sodium). Regular tomato and vegetable juice (not low-sodium or reduced-sodium). Pickles. Olives. Fruits Canned fruit in a light or heavy syrup. Fried fruit. Fruit in cream or butter sauce. Meat and other protein foods Fatty cuts of meat. Ribs. Fried meat. Bacon. Sausage. Bologna and other processed lunch meats. Salami. Fatback. Hotdogs. Bratwurst. Salted nuts and seeds. Canned beans with added salt. Canned or smoked fish. Whole eggs or egg yolks. Chicken or turkey with skin. Dairy Whole or 2% milk, cream, and half-and-half. Whole or full-fat cream cheese. Whole-fat or sweetened yogurt. Full-fat cheese. Nondairy creamers. Whipped toppings.  Processed cheese and cheese spreads. Fats and oils Butter. Stick margarine. Lard. Shortening. Ghee. Bacon fat. Tropical oils, such as coconut, palm kernel, or palm oil. Seasoning and other foods Salted popcorn and pretzels. Onion salt, garlic salt, seasoned salt, table salt, and sea salt. Worcestershire sauce. Tartar sauce. Barbecue sauce. Teriyaki sauce. Soy sauce, including reduced-sodium. Steak sauce. Canned and packaged gravies. Fish sauce. Oyster sauce. Cocktail sauce. Horseradish that you find on the shelf. Ketchup. Mustard. Meat flavorings and tenderizers. Bouillon cubes. Hot sauce and Tabasco sauce. Premade or packaged marinades. Premade or packaged taco seasonings. Relishes. Regular salad dressings. Where to find more information:  National Heart, Lung, and Blood Institute: www.nhlbi.nih.gov  American Heart Association: www.heart.org Summary  The DASH eating plan is a healthy eating plan that has been shown to reduce high blood pressure (hypertension). It may also reduce your risk for type 2 diabetes, heart disease, and stroke.  With the DASH eating plan, you should limit salt (sodium) intake to 2,300 mg a day. If you have hypertension, you may need to reduce your sodium intake to 1,500 mg a day.  When on the DASH eating plan, aim to eat more fresh fruits and vegetables, whole grains, lean proteins, low-fat dairy, and heart-healthy fats.  Work with your health care provider or diet and nutrition specialist (dietitian) to adjust your eating plan to your individual   calorie needs. This information is not intended to replace advice given to you by your health care provider. Make sure you discuss any questions you have with your health care provider. Document Released: 12/23/2010 Document Revised: 12/28/2015 Document Reviewed: 12/28/2015 Elsevier Interactive Patient Education  2017 [place]. Diabetes Mellitus and Exercise Exercising regularly is important for your overall health,  especially when you have diabetes (diabetes mellitus). Exercising is not only about losing weight. It has many health benefits, such as increasing muscle strength and bone density and reducing body fat and stress. This leads to improved fitness, flexibility, and endurance, all of which result in better overall health. Exercise has additional benefits for people with diabetes, including:  Reducing appetite.  Helping to lower and control blood glucose.  Lowering blood pressure.  Helping to control amounts of fatty substances (lipids) in the blood, such as cholesterol and triglycerides.  Helping the body to respond better to insulin (improving insulin sensitivity).  Reducing how much insulin the body needs.  Decreasing the risk for heart disease by: ? Lowering cholesterol and triglyceride levels. ? Increasing the levels of good cholesterol. ? Lowering blood glucose levels.  What is my activity plan? Your health care provider or certified diabetes educator can help you make a plan for the type and frequency of exercise (activity plan) that works for you. Make sure that you:  Do at least 150 minutes of moderate-intensity or vigorous-intensity exercise each week. This could be brisk walking, biking, or water aerobics. ? Do stretching and strength exercises, such as yoga or weightlifting, at least 2 times a week. ? Spread out your activity over at least 3 days of the week.  Get some form of physical activity every day. ? Do not go more than 2 days in a row without some kind of physical activity. ? Avoid being inactive for more than 90 minutes at a time. Take frequent breaks to walk or stretch.  Choose a type of exercise or activity that you enjoy, and set realistic goals.  Start slowly, and gradually increase the intensity of your exercise over time.  What do I need to know about managing my diabetes?  Check your blood glucose before and after exercising. ? If your blood glucose is  higher than 240 mg/dL (13.3 mmol/L) before you exercise, check your urine for ketones. If you have ketones in your urine, do not exercise until your blood glucose returns to normal.  Know the symptoms of low blood glucose (hypoglycemia) and how to treat it. Your risk for hypoglycemia increases during and after exercise. Common symptoms of hypoglycemia can include: ? Hunger. ? Anxiety. ? Sweating and feeling clammy. ? Confusion. ? Dizziness or feeling light-headed. ? Increased heart rate or palpitations. ? Blurry vision. ? Tingling or numbness around the mouth, lips, or tongue. ? Tremors or shakes. ? Irritability.  Keep a rapid-acting carbohydrate snack available before, during, and after exercise to help prevent or treat hypoglycemia.  Avoid injecting insulin into areas of the body that are going to be exercised. For example, avoid injecting insulin into: ? The arms, when playing tennis. ? The legs, when jogging.  Keep records of your exercise habits. Doing this can help you and your health care provider adjust your diabetes management plan as needed. Write down: ? Food that you eat before and after you exercise. ? Blood glucose levels before and after you exercise. ? The type and amount of exercise you have done. ? When your insulin is expected to peak,  if you use insulin. Avoid exercising at times when your insulin is peaking.  When you start a new exercise or activity, work with your health care provider to make sure the activity is safe for you, and to adjust your insulin, medicines, or food intake as needed.  Drink plenty of water while you exercise to prevent dehydration or heat stroke. Drink enough fluid to keep your urine clear or pale yellow. This information is not intended to replace advice given to you by your health care provider. Make sure you discuss any questions you have with your health care provider. Document Released: 03/26/2003 Document Revised: 07/24/2015 Document  Reviewed: 06/15/2015 Elsevier Interactive Patient Education  2018 [place]. Blood Glucose Monitoring, Adult Monitoring your blood sugar (glucose) helps you manage your diabetes. It also helps you and your health care provider determine how well your diabetes management plan is working. Blood glucose monitoring involves checking your blood glucose as often as directed, and keeping a record (log) of your results over time. Why should I monitor my blood glucose? Checking your blood glucose regularly can:  Help you understand how food, exercise, illnesses, and medicines affect your blood glucose.  Let you know what your blood glucose is at any time. You can quickly tell if you are having low blood glucose (hypoglycemia) or high blood glucose (hyperglycemia).  Help you and your health care provider adjust your medicines as needed.  When should I check my blood glucose? Follow instructions from your health care provider about how often to check your blood glucose. This may depend on:  The type of diabetes you have.  How well-controlled your diabetes is.  Medicines you are taking.  If you have type 1 diabetes:  Check your blood glucose at least 2 times a day.  Also check your blood glucose: ? Before every insulin injection. ? Before and after exercise. ? Between meals. ? 2 hours after a meal. ? Occasionally between 2:00 a.m. and 3:00 a.m., as directed. ? Before potentially dangerous tasks, like driving or using heavy machinery. ? At bedtime.  You may need to check your blood glucose more often, up to 6-10 times a day: ? If you use an insulin pump. ? If you need multiple daily injections (MDI). ? If your diabetes is not well-controlled. ? If you are ill. ? If you have a history of severe hypoglycemia. ? If you have a history of not knowing when your blood glucose is getting low (hypoglycemia unawareness). If you have type 2 diabetes:  If you take insulin or other diabetes  medicines, check your blood glucose at least 2 times a day.  If you are on intensive insulin therapy, check your blood glucose at least 4 times a day. Occasionally, you may also need to check between 2:00 a.m. and 3:00 a.m., as directed.  Also check your blood glucose: ? Before and after exercise. ? Before potentially dangerous tasks, like driving or using heavy machinery.  You may need to check your blood glucose more often if: ? Your medicine is being adjusted. ? Your diabetes is not well-controlled. ? You are ill. What is a blood glucose log?  A blood glucose log is a record of your blood glucose readings. It helps you and your health care provider: ? Look for patterns in your blood glucose over time. ? Adjust your diabetes management plan as needed.  Every time you check your blood glucose, write down your result and notes about things that may be affecting  your blood glucose, such as your diet and exercise for the day.  Most glucose meters store a record of glucose readings in the meter. Some meters allow you to download your records to a computer. How do I check my blood glucose? Follow these steps to get accurate readings of your blood glucose: Supplies needed   Blood glucose meter.  Test strips for your meter. Each meter has its own strips. You must use the strips that come with your meter.  A needle to prick your finger (lancet). Do not use lancets more than once.  A device that holds the lancet (lancing device).  A journal or log book to write down your results. Procedure  Wash your hands with soap and water.  Prick the side of your finger (not the tip) with the lancet. Use a different finger each time.  Gently rub the finger until a small drop of blood appears.  Follow instructions that come with your meter for inserting the test strip, applying blood to the strip, and using your blood glucose meter.  Write down your result and any notes. Alternative testing  sites  Some meters allow you to use areas of your body other than your finger (alternative sites) to test your blood.  If you think you may have hypoglycemia, or if you have hypoglycemia unawareness, do not use alternative sites. Use your finger instead.  Alternative sites may not be as accurate as the fingers, because blood flow is slower in these areas. This means that the result you get may be delayed, and it may be different from the result that you would get from your finger.  The most common alternative sites are: ? Forearm. ? Thigh. ? Palm of the hand. Additional tips  Always keep your supplies with you.  If you have questions or need help, all blood glucose meters have a 24-hour "hotline" number that you can call. You may also contact your health care provider.  After you use a few boxes of test strips, adjust (calibrate) your blood glucose meter by following instructions that came with your meter. This information is not intended to replace advice given to you by your health care provider. Make sure you discuss any questions you have with your health care provider. Document Released: 01/06/2003 Document Revised: 07/24/2015 Document Reviewed: 06/15/2015 Elsevier Interactive Patient Education  2017 [employer].

## 2016-10-17 NOTE — Progress Notes (Signed)
Adrienne [name], is a 61 y.o. female  AYO:[id number]  ELT:[id number]  DOB - 05/21/1955  Chief Complaint  Patient presents with  . Diabetes       Subjective:   Adrienne [name] is a 61 y.o. female with a history of uncontrolled type 2 diabetes mellitus (A1c 10.9 which has trended up from 7.9 previously), anemia, central retinal artery occlusion (Followed by [facility]), hypertension who presents here today for a follow up visit of diabetes and to get prescription for glucometer scan. Blood pressure is now controlled, BS is 83 this afternoon. Patient lives alone at home, has difficulty with ADL especially cooking and grocery shopping. She said her BS is better controlled though. No complaint today. She has another appointment coming up with Glaucoma specialist and a retinal specialist. Patient denies being depressed, denies suicidal ideation or thoughts. Patient has No headache, No chest pain, No abdominal pain - No Nausea, No new weakness tingling or numbness, No Cough - SOB.  Problem  Essential Hypertension  Diabetes Mellitus (Hcc)  Other Hyperlipidemia  Central Retinal Artery Occlusion of Right Eye    ALLERGIES: Allergies  Allergen Reactions  . Codeine Itching  . Hydrocodone Itching  . Niacin And Related Itching  . Oxycodone Itching  . Penicillin G Itching    PAST MEDICAL HISTORY: Past Medical History:  Diagnosis Date  . Diabetes ([place])   . High cholesterol   . Hypertension     MEDICATIONS AT HOME: Prior to Admission medications   Medication Sig Start Date End Date Taking? Authorizing Provider  aspirin EC 81 MG tablet Take 81 mg by mouth daily.    Yes [provider]  atorvastatin (LIPITOR) 40 MG tablet Take 1 tablet (40 mg total) by mouth daily. 10/17/16  Yes Tresa Garter, MD  blood glucose meter kit and supplies Dispense based on patient and insurance preference. Use up to four times daily as directed. (FOR ICD-9 250.00, 250.01). 09/08/15   Yes Lavina Hamman, MD  Blood Glucose Monitoring Suppl (PRODIGY VOICE BLOOD GLUCOSE) w/Device KIT Use as directed 07/19/16  Yes Amao, Charlane Ferretti, MD  brimonidine (ALPHAGAN) 0.15 % ophthalmic solution Place 1 drop into both eyes 2 (two) times daily.  05/20/15  Yes [provider]  cetirizine (ZYRTEC) 10 MG tablet Take 1 tablet (10 mg total) by mouth daily. 06/17/16  Yes Arnoldo Morale, MD  clopidogrel (PLAVIX) 75 MG tablet Take 1 tablet (75 mg total) by mouth daily. 10/17/16  Yes Ludy Messamore, Marlena Clipper, MD  dorzolamide-timolol (COSOPT) 22.3-6.8 MG/ML ophthalmic solution Place 1 drop into both eyes 2 (two) times daily.  08/05/15  Yes [provider]  glucose blood test strip Use as instructed 07/19/16  Yes Amao, Enobong, MD  insulin aspart (NOVOLOG FLEXPEN) 100 UNIT/ML FlexPen Inject 0-12 Units into the skin 3 (three) times daily before meals. As per sliding scale 10/17/16  Yes Cyd Hostler E, MD  Insulin Glargine (LANTUS SOLOSTAR) 100 UNIT/ML Solostar Pen Inject 50 Units into the skin daily at 10 pm. 10/17/16  Yes Sheryn Aldaz E, MD  Insulin Pen Needle 31G X 5 MM MISC 1 Act by Does not apply route 4 (four) times daily - after meals and at bedtime. 09/08/15  Yes Lavina Hamman, MD  losartan-hydrochlorothiazide (HYZAAR) 100-25 MG tablet Take 1 tablet by mouth daily. 10/17/16  Yes Tresa Garter, MD  metFORMIN (GLUCOPHAGE) 1000 MG tablet Take 1 tablet (1,000 mg total) by mouth 2 (two) times daily. 10/17/16  Yes Tresa Garter, MD  methocarbamol (ROBAXIN) 500 MG tablet Take 2 tablets (1,000 mg total) by mouth every 12 (twelve) hours as needed for muscle spasms. 06/03/16  Yes Arnoldo Morale, MD  metoCLOPramide (REGLAN) 10 MG tablet Take 1 tablet (10 mg total) by mouth every 8 (eight) hours as needed for nausea. 08/29/16  Yes McClung, Angela M, PA-C  promethazine (PHENERGAN) 25 MG tablet Take 1 tablet (25 mg total) by mouth every 8 (eight) hours as needed for nausea or vomiting. 06/16/16   Yes Amao, Enobong, MD  ranitidine (ZANTAC) 150 MG tablet TAKE 1 TABLET BY MOUTH 2 TIMES DAILY 09/27/16  Yes Arnoldo Morale, MD    Objective:   Vitals:   10/17/16 1538  BP: 125/72  Pulse: 84  Resp: 18  Temp: 98.2 F (36.8 C)  TempSrc: Oral  SpO2: 99%  Weight: 195 lb (88.5 kg)  Height: 5' 6"  (1.676 m)   Exam General appearance : Awake, alert, not in any distress. Speech Clear. Not toxic looking, visually impaired HEENT: Atraumatic and Normocephalic, pupils equally reactive to light and accomodation Neck: Supple, no JVD. No cervical lymphadenopathy.  Chest: Good air entry bilaterally, no added sounds  CVS: S1 S2 regular, no murmurs.  Abdomen: Bowel sounds present, Non tender and not distended with no gaurding, rigidity or rebound. Extremities: B/L Lower Ext shows no edema, both legs are warm to touch Neurology: Awake alert, and oriented X 3, CN II-XII intact, Non focal Skin: No Rash  Data Review Lab Results  Component Value Date   HGBA1C 10.6 06/17/2016   HGBA1C 10.9 05/04/2016   HGBA1C 7.9 12/15/2015    Assessment & Plan   1. Type 2 diabetes mellitus with other specified complication, with long-term current use of insulin (HCC)  - metFORMIN (GLUCOPHAGE) 1000 MG tablet; Take 1 tablet (1,000 mg total) by mouth 2 (two) times daily.  Dispense: 180 tablet; Refill: 3 - Insulin Glargine (LANTUS SOLOSTAR) 100 UNIT/ML Solostar Pen; Inject 50 Units into the skin daily at 10 pm.  Dispense: 5 pen; Refill: 3 - insulin aspart (NOVOLOG FLEXPEN) 100 UNIT/ML FlexPen; Inject 0-12 Units into the skin 3 (three) times daily before meals. As per sliding scale  Dispense: 15 mL; Refill: 3 - clopidogrel (PLAVIX) 75 MG tablet; Take 1 tablet (75 mg total) by mouth daily.  Dispense: 90 tablet; Refill: 3  2. Essential hypertension  - losartan-hydrochlorothiazide (HYZAAR) 100-25 MG tablet; Take 1 tablet by mouth daily.  Dispense: 90 tablet; Refill: 3  We have discussed target BP range and blood  pressure goal. I have advised patient to check BP regularly and to call us back or report to clinic if the numbers are consistently higher than 140/90. We discussed the importance of compliance with medical therapy and DASH diet recommended, consequences of uncontrolled hypertension discussed.  - continue current BP medications  3. Central retinal artery occlusion of right eye  - Patient is visually impaired, coupled with multiple medical co-morbidities including uncontrolled diabetes, and hypertension, it will be difficult to ,onitor blood sugar or inject appropriate doses of insulin - Refer to LCSW and Case Manager for Home health arrangement  4. Other hyperlipidemia  - clopidogrel (PLAVIX) 75 MG tablet; Take 1 tablet (75 mg total) by mouth daily.  Dispense: 90 tablet; Refill: 3 - atorvastatin (LIPITOR) 40 MG tablet; Take 1 tablet (40 mg total) by mouth daily.  Dispense: 90 tablet; Refill: 3  Patient have been counseled extensively about nutrition and exercise. Other issues discussed during this visit include: low cholesterol diet, weight  control and daily exercise, foot care, annual eye examinations at Ophthalmology, importance of adherence with medications and regular follow-up. We also discussed long term complications of uncontrolled diabetes and hypertension.   Return in about 3 months (around 01/17/2017) for Hemoglobin A1C and Follow up, DM, Follow up HTN.  The patient was given clear instructions to go to ER or return to medical center if symptoms don't improve, worsen or new problems develop. The patient verbalized understanding. The patient was told to call to get lab results if they haven't heard anything in the next week.   This note has been created with Surveyor, quantity. Any transcriptional errors are unintentional.    Angelica Chessman, MD, [place], [place], [place], [place] and [place],  [place]   10/17/2016, 4:05 PM

## 2016-10-17 NOTE — BH Specialist Note (Signed)
Integrated Behavioral Health Initial Visit  MRN: [id number] Name: Adrienne [name]  Number of [place] Clinician visits:: 1/6 Session Start time: 4:25 PM  Session End time: 4:50 PM Total time: 25 minutes  Type of Service: [place] Interpretor:No. Interpretor Name and Language: N/A   Warm Hand Off Completed.       SUBJECTIVE: Adrienne [name] is a 61 y.o. female accompanied by self Patient was referred by Dr. Doreene Burke for depression and anxiety. Patient reports the following symptoms/concerns: little interest in doing things, difficulty sleeping, difficulty concentrating, and difficulty relaxing Duration of problem: A month; Severity of problem: mild  OBJECTIVE: Mood: Pleasant and Affect: Appropriate Risk of harm to self or others: No plan to harm self or others  LIFE CONTEXT: Family and Social: Pt receives support from adult daughters and son. She has a relationship with her neighbors and goes to church when able School/Work: Pt was recently fired on 10/05/16. She receives disability ($1760) and food stamps ($15) Self-Care: Pt prays and talks with family to cope with stressors Life Changes: Pt recently lost employment. She has ongoing medical concerns that is impacting ability to complete her activities of daily living  GOALS ADDRESSED: Patient will: 1. Reduce symptoms of: stress 2. Increase knowledge and/or ability of: coping skills  3. Demonstrate ability to: Increase healthy adjustment to current life circumstances and Increase adequate support systems for patient/family  INTERVENTIONS: Interventions utilized: Solution-Focused Strategies, Supportive Counseling, Psychoeducation and/or Health Education and Link to [employer]  Standardized Assessments completed: GAD-7 and PHQ 2&9  ASSESSMENT: Patient currently experiencing mild anxiety and depression triggered by ongoing medical concerns that is impacting  pt's ability to complete her activities of daily living. Pt reports increased stress from impaired sight.    Patient may benefit from psychoeducation and psychotherapy. Starke educated pt on how stress can negatively impact one's mental and physical health. LCSWA introduced mindfulness and relaxation techniques to assist in managing symptoms. Pt has SCAT and has participated in therapy through hospice after the loss of a previous tenant.  PLAN: 1. Follow up with behavioral health clinician on : LCSWA will follow up with pt to inquire about healthy coping skills discussed during visit. Pt was encouraged to contact LCSWA if symptoms worsen or fail to improve to schedule behavioral appointments at [facility]. 2. Behavioral recommendations: LCSWA recommends that pt apply healthy coping skills discussed. Pt is encouraged to schedule follow up appointment with LCSWA 3. Referral(s): Community Resources:  Food 4. "From scale of 1-10, how likely are you to follow plan?": 9/10  Rebekah Chesterfield, LCSW 10/19/16 5:43 PM

## 2016-10-18 DIAGNOSIS — Z23 Encounter for immunization: Secondary | ICD-10-CM | POA: Diagnosis not present

## 2016-10-20 MED FILL — LOSARTAN-HCTZ 100-25 MG TAB: 100-25 | 30 days supply | Qty: 30 | Fill #0

## 2016-10-20 MED FILL — ATORVASTATIN 40 MG TABLET: 40 | 30 days supply | Qty: 30 | Fill #1

## 2016-11-02 MED FILL — BRIMONIDINE TARTRATE 0.15%: 0.15 | 40 days supply | Qty: 5 | Fill #3

## 2016-11-02 MED FILL — LANTUS SOLOSTAR 100 UNITS/M: 100 | 30 days supply | Qty: 15 | Fill #0

## 2016-11-14 MED FILL — LOSARTAN-HCTZ 100-25 MG TAB: 100-25 | 30 days supply | Qty: 30 | Fill #1

## 2016-11-14 MED FILL — raNITIdine HCL 150 MG TABS: 150 | 30 days supply | Qty: 60 | Fill #1

## 2016-11-14 MED FILL — ATORVASTATIN 40 MG TABLET: 40 | 30 days supply | Qty: 30 | Fill #2

## 2016-11-14 MED FILL — CLOPIDOGREL 75 MG TABLET: 75 | 90 days supply | Qty: 90 | Fill #0

## 2016-11-14 MED FILL — metFORMIN HCL 1000 MG TABS: 1000 | 90 days supply | Qty: 180 | Fill #0

## 2016-12-12 MED FILL — LANTUS SOLOSTAR 100 UNITS/M: 100 | 30 days supply | Qty: 15 | Fill #1

## 2016-12-12 MED FILL — DORZOLAMIDE-TIMOLOL EYE DRP: 22.3-6.8 | 37 days supply | Qty: 10 | Fill #3

## 2016-12-14 MED FILL — CIPROFLOXACIN 0.3% EYE DROP: 0.3 | 50 days supply | Qty: 100 | Fill #0

## 2016-12-14 MED FILL — PREDNISOLONE AC 1% EYE DROP: 1 | 18 days supply | Qty: 5 | Fill #0

## 2016-12-20 MED FILL — LOSARTAN-HCTZ 100-25 MG TAB: 100-25 | 30 days supply | Qty: 30 | Fill #2

## 2017-01-23 ENCOUNTER — Telehealth: Payer: Self-pay

## 2017-01-23 ENCOUNTER — Encounter: Payer: Self-pay | Admitting: Family Medicine

## 2017-01-23 ENCOUNTER — Ambulatory Visit: Payer: BLUE CROSS/BLUE SHIELD | Attending: Family Medicine | Admitting: Family Medicine

## 2017-01-23 VITALS — BP 152/84 | HR 74 | Temp 97.7°F | Ht 66.0 in | Wt 187.2 lb

## 2017-01-23 DIAGNOSIS — Z7902 Long term (current) use of antithrombotics/antiplatelets: Secondary | ICD-10-CM | POA: Diagnosis not present

## 2017-01-23 DIAGNOSIS — Z794 Long term (current) use of insulin: Secondary | ICD-10-CM | POA: Diagnosis not present

## 2017-01-23 DIAGNOSIS — E7800 Pure hypercholesterolemia, unspecified: Secondary | ICD-10-CM | POA: Diagnosis not present

## 2017-01-23 DIAGNOSIS — R519 Headache, unspecified: Secondary | ICD-10-CM

## 2017-01-23 DIAGNOSIS — E1169 Type 2 diabetes mellitus with other specified complication: Secondary | ICD-10-CM | POA: Diagnosis not present

## 2017-01-23 DIAGNOSIS — I10 Essential (primary) hypertension: Secondary | ICD-10-CM | POA: Insufficient documentation

## 2017-01-23 DIAGNOSIS — E7849 Other hyperlipidemia: Secondary | ICD-10-CM | POA: Insufficient documentation

## 2017-01-23 DIAGNOSIS — H409 Unspecified glaucoma: Secondary | ICD-10-CM | POA: Insufficient documentation

## 2017-01-23 DIAGNOSIS — R51 Headache: Secondary | ICD-10-CM | POA: Diagnosis not present

## 2017-01-23 DIAGNOSIS — E1110 Type 2 diabetes mellitus with ketoacidosis without coma: Secondary | ICD-10-CM | POA: Insufficient documentation

## 2017-01-23 DIAGNOSIS — E119 Type 2 diabetes mellitus without complications: Secondary | ICD-10-CM | POA: Diagnosis present

## 2017-01-23 DIAGNOSIS — Z79899 Other long term (current) drug therapy: Secondary | ICD-10-CM | POA: Insufficient documentation

## 2017-01-23 DIAGNOSIS — N941 Unspecified dyspareunia: Secondary | ICD-10-CM | POA: Diagnosis not present

## 2017-01-23 DIAGNOSIS — R0982 Postnasal drip: Secondary | ICD-10-CM | POA: Diagnosis not present

## 2017-01-23 DIAGNOSIS — Z7982 Long term (current) use of aspirin: Secondary | ICD-10-CM | POA: Diagnosis not present

## 2017-01-23 LAB — POCT GLYCOSYLATED HEMOGLOBIN (HGB A1C): Hemoglobin A1C: 8.9

## 2017-01-23 LAB — GLUCOSE, POCT (MANUAL RESULT ENTRY): POC GLUCOSE: 128 mg/dL — AB (ref 70–99)

## 2017-01-23 MED ORDER — PROGESTERONE MICRONIZED 100 MG PO CAPS: 100.0000 mg | ORAL_CAPSULE | Freq: Every day | ORAL | 1 refills | Status: DC

## 2017-01-23 MED ORDER — ATORVASTATIN CALCIUM 40 MG PO TABS: 40.0000 mg | ORAL_TABLET | Freq: Every day | ORAL | 3 refills | Status: DC

## 2017-01-23 MED ORDER — RANITIDINE HCL 150 MG PO TABS: 150.0000 mg | ORAL_TABLET | Freq: Two times a day (BID) | ORAL | 1 refills | Status: DC

## 2017-01-23 MED ORDER — LOSARTAN POTASSIUM-HCTZ 100-25 MG PO TABS: 1.0000 | ORAL_TABLET | Freq: Every day | ORAL | 3 refills | Status: DC

## 2017-01-23 MED ORDER — INSULIN GLARGINE 100 UNIT/ML SOLOSTAR PEN: 55.0000 [IU] | PEN_INJECTOR | Freq: Every day | SUBCUTANEOUS | 3 refills | Status: DC

## 2017-01-23 MED ORDER — INSULIN ASPART 100 UNIT/ML FLEXPEN: 0.0000 [IU] | PEN_INJECTOR | Freq: Three times a day (TID) | SUBCUTANEOUS | 3 refills | Status: DC

## 2017-01-23 MED ORDER — CLOPIDOGREL BISULFATE 75 MG PO TABS: 75.0000 mg | ORAL_TABLET | Freq: Every day | ORAL | 3 refills | Status: DC

## 2017-01-23 MED ORDER — ESTRADIOL 0.1 MG/GM VA CREA: 1.0000 | TOPICAL_CREAM | Freq: Every day | VAGINAL | 1 refills | Status: DC

## 2017-01-23 MED ORDER — METFORMIN HCL 1000 MG PO TABS: 1000.0000 mg | ORAL_TABLET | Freq: Two times a day (BID) | ORAL | 3 refills | Status: DC

## 2017-01-23 MED ORDER — CETIRIZINE HCL 10 MG PO TABS: 10.0000 mg | ORAL_TABLET | Freq: Every day | ORAL | 3 refills | Status: DC

## 2017-01-23 MED FILL — LOSARTAN-HCTZ 100-25 MG TAB: 100-25 | 30 days supply | Qty: 30 | Fill #3

## 2017-01-23 MED FILL — LANTUS SOLOSTAR 100 UNITS/M: 100 | 30 days supply | Qty: 15 | Fill #2

## 2017-01-23 NOTE — Progress Notes (Signed)
Subjective:  Patient ID: Adrienne [name], female    DOB: 08-05-1955  Age: 62 y.o. MRN: [id number]  CC: Diabetes   HPI Adrienne [name]  is a 62 year old female with a history of uncontrolled type 2 diabetes mellitus (A1c 8.9 previously), anemia, central retinal artery occlusion (Followed by [facility]), glaucoma, hypertension who comes into the clinic for follow-up visit  Her A1c is 8.9 and has improved from 9.7 previously; she endorses compliance with her medications and has been cutting out sweets and trying to eat more healthy.  She has also lost about 11 pounds since her last visit.  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 He has had a postop visit and has upcoming appointments with ophthalmology plans for glasses. She is severely visually impaired and has to feel around when walking as she is only able to identify forms but not specifics.  Today she complains of pain during intercourse she has not been active in the last 11 years but now has a boyfriend and he is unable to attain penetration. She has also been coughing for the last 1 month but endorses postnasal drainage; review of her medications she has been out of her Zyrtec.  Past Medical History:  Diagnosis Date  . Diabetes ([place])   . High cholesterol   . Hypertension     Past Surgical History:  Procedure Laterality Date  . CATARACT EXTRACTION, BILATERAL     Laser eye surgery, cataract surgery bi-lat  . CESAREAN SECTION     [facility], [place]  . EYE SURGERY Bilateral 2012   LASER, CATARACT  . TEE WITHOUT CARDIOVERSION N/A 12/31/2013   Procedure: TRANSESOPHAGEAL ECHOCARDIOGRAM (TEE);  Surgeon: Laverda Page, MD;  Location: [facility];  Service: Cardiovascular;  Laterality: N/A;  H&P in file  . TUBAL LIGATION Bilateral     Allergies  Allergen Reactions  .  Codeine Itching  . Hydrocodone Itching  . Niacin And Related Itching  . Oxycodone Itching  . Penicillin G Itching     Outpatient Medications Prior to Visit  Medication Sig Dispense Refill  . aspirin EC 81 MG tablet Take 81 mg by mouth daily.     . blood glucose meter kit and supplies Dispense based on patient and insurance preference. Use up to four times daily as directed. (FOR ICD-9 250.00, 250.01). 1 each 0  . brimonidine (ALPHAGAN) 0.15 % ophthalmic solution Place 1 drop into both eyes 2 (two) times daily.     . dorzolamide-timolol (COSOPT) 22.3-6.8 MG/ML ophthalmic solution Place 1 drop into both eyes 2 (two) times daily.   2  . Insulin Pen Needle 31G X 5 MM MISC 1 Act by Does not apply route 4 (four) times daily - after meals and at bedtime. 300 each 0  . metoCLOPramide (REGLAN) 10 MG tablet Take 1 tablet (10 mg total) by mouth every 8 (eight) hours as needed for nausea. 90 tablet 1  . promethazine (PHENERGAN) 25 MG tablet Take 1 tablet (25 mg total) by mouth every 8 (eight) hours as needed for nausea or vomiting. 20 tablet 0  . atorvastatin (LIPITOR) 40 MG tablet Take 1 tablet (40 mg total) by mouth daily. 90 tablet 3  . clopidogrel (PLAVIX) 75 MG tablet Take 1 tablet (75 mg total) by mouth daily. 90 tablet 3  . Insulin Glargine (LANTUS SOLOSTAR) 100 UNIT/ML Solostar Pen Inject 50  Units into the skin daily at 10 pm. 5 pen 3  . losartan-hydrochlorothiazide (HYZAAR) 100-25 MG tablet Take 1 tablet by mouth daily. 90 tablet 3  . metFORMIN (GLUCOPHAGE) 1000 MG tablet Take 1 tablet (1,000 mg total) by mouth 2 (two) times daily. 180 tablet 3  . methocarbamol (ROBAXIN) 500 MG tablet Take 2 tablets (1,000 mg total) by mouth every 12 (twelve) hours as needed for muscle spasms. 120 tablet 1  . Blood Glucose Monitoring Suppl (PRODIGY VOICE BLOOD GLUCOSE) w/Device KIT Use as directed (Patient not taking: Reported on 01/23/2017) 1 kit 0  . glucose blood test strip Use as instructed (Patient not  taking: Reported on 01/23/2017) 100 each 12  . cetirizine (ZYRTEC) 10 MG tablet Take 1 tablet (10 mg total) by mouth daily. (Patient not taking: Reported on 01/23/2017) 30 tablet 3  . insulin aspart (NOVOLOG FLEXPEN) 100 UNIT/ML FlexPen Inject 0-12 Units into the skin 3 (three) times daily before meals. As per sliding scale (Patient not taking: Reported on 01/23/2017) 15 mL 3  . ranitidine (ZANTAC) 150 MG tablet TAKE 1 TABLET BY MOUTH 2 TIMES DAILY (Patient not taking: Reported on 01/23/2017) 60 tablet 1   No facility-administered medications prior to visit.     ROS Review of Systems  Constitutional: Negative for activity change, appetite change and fatigue.  HENT: Negative for congestion, sinus pressure and sore throat.   Eyes: Positive for visual disturbance.  Respiratory: Negative for cough, chest tightness, shortness of breath and wheezing.   Cardiovascular: Negative for chest pain and palpitations.  Gastrointestinal: Negative for abdominal distention, abdominal pain and constipation.  Endocrine: Negative for polydipsia.  Genitourinary: Negative for dysuria and frequency.  Musculoskeletal: Negative for arthralgias and back pain.  Skin: Negative for rash.  Neurological: Negative for tremors, light-headedness and numbness.  Hematological: Does not bruise/bleed easily.  Psychiatric/Behavioral: Negative for agitation and behavioral problems.    Objective:  BP (!) 152/84   Pulse 74   Temp 97.7 F (36.5 C) (Oral)   Ht _0  (1.676 m)   Wt 187 lb 3.2 oz (84.9 kg)   SpO2 100%   BMI 30.21 kg/m   BP/Weight 01/23/2017 10/17/2016 1/60/7371  Systolic BP 062 694 854  Diastolic BP 84 72 82  Wt. (Lbs) 187.2 195 194.8  BMI 30.21 31.47 31.44     Physical Exam  Constitutional: She is oriented to person, place, and time. She appears well-developed and well-nourished.  Cardiovascular: Normal rate, normal heart sounds and intact distal pulses.  No murmur heard. Pulmonary/Chest: Effort normal and  breath sounds normal. She has no wheezes. She has no rales. She exhibits no tenderness.  Abdominal: Soft. Bowel sounds are normal. She exhibits no distension and no mass. There is no tenderness.  Musculoskeletal: Normal range of motion.  Neurological: She is alert and oriented to person, place, and time.  Skin: Skin is warm and dry.  Psychiatric: She has a normal mood and affect.     CMP Latest Ref Rng & Units 08/29/2016 06/17/2016 05/04/2016  Glucose 65 - 99 mg/dL 184(H) 262(H) 204(H)  BUN 8 - 27 mg/dL _1 Creatinine 0.57 - 1.00 mg/dL 1.15(H) 1.36(H) 0.89  Sodium 134 - 144 mmol/L 137 139 139  Potassium 3.5 - 5.2 mmol/L 4.3 4.4 4.3  Chloride 96 - 106 mmol/L 99 96 100  CO2 20 - 29 mmol/L _2 Calcium 8.7 - 10.3 mg/dL 9.2 9.6 9.3  Total Protein 6.0 - 8.5 g/dL 6.8 - 6.7  Total Bilirubin 0.0 - 1.2 mg/dL 0.2 - 0.3  Alkaline Phos 39 - 117 IU/L 105 - 97  AST 0 - 40 IU/L 14 - 14  ALT 0 - 32 IU/L 13 - 16    Lipid Panel     Component Value Date/Time   CHOL 144 01/04/2016 1138   TRIG 58 01/04/2016 1138   HDL 53 01/04/2016 1138   CHOLHDL 2.7 01/04/2016 1138   VLDL 12 01/04/2016 1138   LDLCALC 79 01/04/2016 1138    Lab Results  Component Value Date   HGBA1C 8.9 01/23/2017    Assessment & Plan:   1. Type 2 diabetes mellitus with ketoacidosis without coma, unspecified whether long term insulin use (HCC) Uncontrolled with A1c of 8.9 which has improved from 9.7 previously Advised to increase Lantus dose from 50 units to 55 units at bedtime Diabetic diet, lifestyle modifications - POCT glucose (manual entry) - POCT glycosylated hemoglobin (Hb A1C)  2. Other hyperlipidemia Controlled Low-cholesterol diet - atorvastatin (LIPITOR) 40 MG tablet; Take 1 tablet (40 mg total) by mouth daily.  Dispense: 90 tablet; Refill: 3 - clopidogrel (PLAVIX) 75 MG tablet; Take 1 tablet (75 mg total) by mouth daily.  Dispense: 90 tablet; Refill: 3  3. Type 2 diabetes mellitus with other  specified complication, with long-term current use of insulin (HCC) - clopidogrel (PLAVIX) 75 MG tablet; Take 1 tablet (75 mg total) by mouth daily.  Dispense: 90 tablet; Refill: 3 - insulin aspart (NOVOLOG FLEXPEN) 100 UNIT/ML FlexPen; Inject 0-12 Units into the skin 3 (three) times daily before meals. As per sliding scale  Dispense: 15 mL; Refill: 3 - Insulin Glargine (LANTUS SOLOSTAR) 100 UNIT/ML Solostar Pen; Inject 55 Units into the skin daily at 10 pm.  Dispense: 5 pen; Refill: 3 - metFORMIN (GLUCOPHAGE) 1000 MG tablet; Take 1 tablet (1,000 mg total) by mouth 2 (two) times daily.  Dispense: 180 tablet; Refill: 3  4. Essential hypertension Uncontrolled No regimen change today but patient has been counseled on low-sodium, DASH diet If blood pressure remains elevated at next visit I will adjust her regimen - losartan-hydrochlorothiazide (HYZAAR) 100-25 MG tablet; Take 1 tablet by mouth daily.  Dispense: 90 tablet; Refill: 3  5. Sinus headache - cetirizine (ZYRTEC) 10 MG tablet; Take 1 tablet (10 mg total) by mouth daily.  Dispense: 30 tablet; Refill: 3  6. Post-nasal drip - cetirizine (ZYRTEC) 10 MG tablet; Take 1 tablet (10 mg total) by mouth daily.  Dispense: 30 tablet; Refill: 3  7. Dyspareunia, female I have discussed the risks of treatment and she is been advised to alternate Estrace cream (which is subject to approval by her insurance company) with Prometrium given she has an intact uterus.  I am holding off on prescribing estradiol tablets due to the fact that she is visually impaired and that will be a major challenge with regards to alternating with progesterone. - estradiol (ESTRACE) 0.1 MG/GM vaginal cream; Place 1 Applicatorful vaginally at bedtime. For 2 weeks then alternate with Prometrium capsules  Dispense: 42.5 g; Refill: 1 - progesterone (PROMETRIUM) 100 MG capsule; Take 1 capsule (100 mg total) by mouth daily. For 2 weeks after use of Estrace cream  Dispense: 14 capsule;  Refill: 1   Meds ordered this encounter  Medications  . atorvastatin (LIPITOR) 40 MG tablet    Sig: Take 1 tablet (40 mg total) by mouth daily.    Dispense:  90 tablet    Refill:  3  . clopidogrel (PLAVIX) 75 MG  tablet    Sig: Take 1 tablet (75 mg total) by mouth daily.    Dispense:  90 tablet    Refill:  3  . insulin aspart (NOVOLOG FLEXPEN) 100 UNIT/ML FlexPen    Sig: Inject 0-12 Units into the skin 3 (three) times daily before meals. As per sliding scale    Dispense:  15 mL    Refill:  3  . Insulin Glargine (LANTUS SOLOSTAR) 100 UNIT/ML Solostar Pen    Sig: Inject 55 Units into the skin daily at 10 pm.    Dispense:  5 pen    Refill:  3    Dispense pens- visually impaired.  Marland Kitchen losartan-hydrochlorothiazide (HYZAAR) 100-25 MG tablet    Sig: Take 1 tablet by mouth daily.    Dispense:  90 tablet    Refill:  3  . metFORMIN (GLUCOPHAGE) 1000 MG tablet    Sig: Take 1 tablet (1,000 mg total) by mouth 2 (two) times daily.    Dispense:  180 tablet    Refill:  3  . ranitidine (ZANTAC) 150 MG tablet    Sig: Take 1 tablet (150 mg total) by mouth 2 (two) times daily.    Dispense:  60 tablet    Refill:  1  . cetirizine (ZYRTEC) 10 MG tablet    Sig: Take 1 tablet (10 mg total) by mouth daily.    Dispense:  30 tablet    Refill:  3  . estradiol (ESTRACE) 0.1 MG/GM vaginal cream    Sig: Place 1 Applicatorful vaginally at bedtime. For 2 weeks then alternate with Prometrium capsules    Dispense:  42.5 g    Refill:  1  . progesterone (PROMETRIUM) 100 MG capsule    Sig: Take 1 capsule (100 mg total) by mouth daily. For 2 weeks after use of Estrace cream    Dispense:  14 capsule    Refill:  1    Follow-up: Return in about 3 months (around 04/23/2017) for follow up of chronic medical conditions.   Arnoldo Morale MD

## 2017-01-23 NOTE — Patient Instructions (Signed)
Dyspareunia, Female Dyspareunia is pain that is associated with sexual activity. This can affect any part of the genitals or lower abdomen, and there are many possible causes. This condition ranges from mild to severe. Depending on the cause, dyspareunia may get better with treatment, or it may return (recur) over time. What are the causes? The cause of this condition is not always known. Possible causes include:  Cancer.  Psychological factors, such as depression, anxiety, or previous traumatic experiences.  Severe pain and tenderness of the skin around the vagina (vulva) when it is touched (vulvar vestibulitis syndrome).  Infection of the pelvis or the vulva.  Infection of the vagina.  Painful, involuntary tightening (contraction) of the vaginal muscles when anything is put inside the vagina (vaginismus).  Allergic reaction.  Ovarian cysts.  Solid growths of tissue (tumors) in the ovaries or the uterus.  Scar tissue in the ovaries, vagina, or pelvis.  Vaginal dryness.  Thinning of the tissue (atrophy) of the vulva and vagina.  Skin conditions that affect the vulva (vulvar dermatoses), such as lichen sclerosus or lichen planus.  Endometriosis.  Tubal pregnancy.  A tilted uterus.  Uterine prolapse.  Adhesions in the vagina.  Bladder problems.  Intestinal problems.  Certain medicines.  Medical conditions such as diabetes, arthritis, or thyroid disease.  What increases the risk? The following factors may make you more likely to develop this condition:  Having experienced physical or sexual trauma.  Having given birth more than once.  Taking birth control pills.  Having gone through menopause.  Having recently given birth, typically within the past 3-6 months.  Breastfeeding.  What are the signs or symptoms? The main symptom of this condition is pain in any part of the genitals or lower abdomen during or after sexual activity. This may include pain  during sexual arousal, genital stimulation, or orgasm. Pain may get worse when anything is inserted into the vagina, or when the genitals are touched in any way, such as when sitting or wearing pants. Pain can range from mild to severe, depending on the cause of the condition. In some cases, symptoms go away with treatment and return (recur) at a later date. How is this diagnosed? This condition may be diagnosed based on:  Your symptoms, including: ? Where your pain is located. ? When your pain occurs.  Your medical history.  A physical exam. This may include a pelvic exam and a Pap test. This is a screening test that is used to check for signs of cancer of the vagina, cervix, and uterus.  Tests, including: ? Blood tests. ? Ultrasound. This uses sound waves to make a picture of the area that is being tested. ? Urine culture. This test involves checking a urine sample for signs of infection. ? Culture test. This is when your health care provider uses a swab to collect a sample of vaginal fluid. The sample is checked for signs of infection. ? X-rays. ? MRI. ? CT scan. ? Laparoscopy. This is a procedure in which a small incision is made in your lower abdomen and a lighted, pencil-sized instrument (laparoscope) is passed through the incision and used to look inside your pelvis.  You may be referred to a health care provider who specializes in women's health (gynecologist). In some cases, diagnosing the cause of dyspareunia can be difficult. How is this treated? Treatment depends on the cause of your condition and your symptoms. In most cases, you may need to stop sexual activity until your symptoms   improve. Treatment may include:  Lubricants.  Kegel exercises or vaginal dilators.  Medicated skin creams.  Medicated vaginal creams.  Hormonal therapy.  Antibiotic medicine to prevent or fight infection.  Medicines that help to relieve pain.  Medicines that treat depression  (antidepressants).  Psychological counseling.  Sex therapy.  Surgery.  Follow these instructions at home: Lifestyle  Avoid tight clothing and irritating materials around your genital and abdominal area.  Use water-based lubricants as needed. Avoid oil-based lubricants.  Do not use any products that irritate you. This may include certain condoms, spermicides, lubricants, soaps, tampons, vaginal sprays, or douches.  Always practice safe sex. Talk with your health care provider about which form of birth control (contraception) is best for you.  Maintain open communication with your sexual partner. General instructions  Take over-the-counter and prescription medicines only as told by your health care provider.  If you had tests done, it is your responsibility to get your tests results. Ask your health care provider or the department performing the test when your results will be ready.  Urinate before you engage in sexual activity.  Consider joining a support group.  Keep all follow-up visits as told by your health care provider. This is important. Contact a health care provider if:  You develop vaginal bleeding after sexual intercourse.  You develop a lump at the opening of your vagina. Seek medical care even if the lump is painless.  You have: ? Abnormal vaginal discharge. ? Vaginal dryness. ? Itchiness or irritation of your vulva or vagina. ? A new rash. ? Symptoms that get worse or do not improve with treatment. ? A fever. ? Pain when you urinate. ? Blood in your urine. Get help right away if:  You develop severe pain in your abdomen during or shortly after sexual intercourse.  You pass out after having sexual intercourse. This information is not intended to replace advice given to you by your health care provider. Make sure you discuss any questions you have with your health care provider. Document Released: 01/23/2007 Document Revised: 05/15/2015 Document  Reviewed: 08/05/2014 Elsevier Interactive Patient Education  2018 Elsevier Inc.  

## 2017-01-23 NOTE — Telephone Encounter (Signed)
Met with the patient when she was in the clinic today for her appointment with Dr Jarold Song. She is interested in personal care/homemaking  services.  Currently she has [employer]BS which will only pay for short term personal care services as long as qualifies for skilled care. She stated that she is having a difficult time paying the premium and is considering dropping the insurance.  She stated that is costs about $200/month and her current income ( disability + check from a prior employer ) is less than $2000/month. She stated that she had received medicaid after a hospitalization but it was terminated. Discussed the option of PCS but she would need to be eligible for medicaid.  She is interested in having some help for a few hours/week. She stated that she will go to DSS to apply for medicaid within the next week but she will need to make sure that she has a person with healthy vision accompany her.    She has a Product/process development scientist with the [facility] [place].   The agency has provided her with her cane, sunglasses and other adaptive equipment.   She stated that she has qualified for $15/month food stamps but did not want to pursue the application process for only $15/month.  She is aware of the Food bank at [facility] on the 4th Tuesday of the month.  Provided her with the booklet of food pantries in [place]. She stated that she has a friend who will be able to read it for her.

## 2017-01-24 ENCOUNTER — Encounter: Payer: Self-pay | Admitting: Family Medicine

## 2017-01-30 MED FILL — ATORVASTATIN 40 MG TABLET: 40 | 90 days supply | Qty: 90 | Fill #0

## 2017-02-01 ENCOUNTER — Telehealth: Payer: Self-pay | Admitting: Family Medicine

## 2017-02-01 NOTE — Telephone Encounter (Signed)
Call placed to [facility] [phone], social worker, at the [facility] to inquire about housekeeping/homemaking as per patient's request. No answer. Left Reuben Likes a message to return my call at [phone].   Call placed to [facility] from [place] [phone], as well regarding services. Cora informed me that patient can benefit from ISP services. She will be sending more information for it regarding it, before a referral is placed.  Called placed to patient to give her an update on my calls for housekeeping/homemaking as she requested. No answer. Left a message asking her to return my call at [phone].

## 2017-02-02 ENCOUNTER — Telehealth: Payer: Self-pay | Admitting: Family Medicine

## 2017-02-02 NOTE — Telephone Encounter (Signed)
Patient returned call from 02/01/17

## 2017-02-09 ENCOUNTER — Telehealth: Payer: Self-pay | Admitting: Family Medicine

## 2017-02-09 NOTE — Telephone Encounter (Signed)
Spoke with patient last week Friday and informed her that I'm currently working on finding an organization that will help her with her housekeeping/homemaking needs. Informed her that I reached out to two organization and I'm waiting for a call back and information. Patient was appreciative and understood. Informed her that I would be calling once I had any updates.  Call placed to [facility] [phone], social worker, at the [facility] to inquire about housekeeping/homemaking. No answer. Left Reuben Likes a message to return my call at [phone]. She speak with Opal Sidles as well.

## 2017-02-13 MED FILL — metFORMIN HCL 1000 MG TABS: 1000 | 90 days supply | Qty: 180 | Fill #1

## 2017-02-13 MED FILL — raNITIdine HCL 150 MG TABS: 150 | 30 days supply | Qty: 60 | Fill #0

## 2017-02-13 MED FILL — CLOPIDOGREL 75 MG TABLET: 75 | 90 days supply | Qty: 90 | Fill #0

## 2017-02-17 ENCOUNTER — Telehealth: Payer: Self-pay | Admitting: Family Medicine

## 2017-02-17 NOTE — Telephone Encounter (Signed)
Received an e-mail from [place] with information on different organizations that can help patient. Called [facility] and one of the phone numbers is no longer in service [phone] other number seems like a personal number [phone]. No one answered.   Called [facility] and spoke with receptionist and she informed me that since patient doesn't have any mental health in her medical history, the organization won't be able to help her. Receptionist did transfer me to Dorita Sciara from [place] who happened to be at their location. Spoke with Kennyth Lose and informed her of patient's situation and Kennyth Lose stated that they can help her. Kennyth Lose gave me her phone number [phone] and asked that patient reach out to her.

## 2017-02-21 ENCOUNTER — Telehealth: Payer: Self-pay | Admitting: Family Medicine

## 2017-02-21 NOTE — Telephone Encounter (Signed)
Call placed to patient [phone] to inform her of my conversation with Kennyth Lose from [facility] and to give her Jackie's information [phone]. Kennyth Lose informed me that she can help patient with her house making/housekeeping needs. Kennyth Lose asked that patient contact her so she can provide her with more information about [facility].   No answer. Left patient a message asking her to return my call at [phone]. Patient can speak with Opal Sidles as well.

## 2017-02-21 NOTE — Telephone Encounter (Signed)
Patient called you back to inform you that the number you gave is wrong.

## 2017-02-23 ENCOUNTER — Telehealth: Payer: Self-pay | Admitting: Family Medicine

## 2017-02-23 NOTE — Telephone Encounter (Signed)
Correct number to Kennyth Lose at [facility] is [phone]

## 2017-02-23 NOTE — Telephone Encounter (Signed)
Patient returned my call. Gave her Lurlean Horns (from [facility]) phone number [phone]. Patient stated that she will be contacting her.

## 2017-02-23 NOTE — Telephone Encounter (Signed)
Returned patient's call. No answer. Left patient a message asking her to call me back at [phone].

## 2017-02-27 MED FILL — !LANTUS SOLOSTAR 100UNITS/M: 100 | 30 days supply | Qty: 15 | Fill #3

## 2017-02-27 MED FILL — LOSARTAN-HCTZ 100-25 MG TAB: 100-25 | 30 days supply | Qty: 30 | Fill #4

## 2017-03-17 MED FILL — DORZOLAMIDE-TIMOLOL EYE DRP: 22.3-6.8 | 30 days supply | Qty: 10 | Fill #0

## 2017-03-17 MED FILL — BRIMONIDINE TARTRATE 0.15%: 0.15 | 30 days supply | Qty: 5 | Fill #3

## 2017-03-31 MED FILL — !LANTUS SOLOSTAR 100UNITS/M: 100 | 27 days supply | Qty: 15 | Fill #0

## 2017-04-14 MED FILL — CLOPIDOGREL 75 MG TABLET: 75 | 90 days supply | Qty: 90 | Fill #0

## 2017-04-14 MED FILL — raNITIdine HCL 150 MG TABS: 150 | 30 days supply | Qty: 60 | Fill #1

## 2017-04-14 MED FILL — LOSARTAN-HCTZ 100-25 MG TAB: 100-25 | 30 days supply | Qty: 30 | Fill #5

## 2017-04-24 ENCOUNTER — Ambulatory Visit: Payer: BLUE CROSS/BLUE SHIELD | Admitting: Family Medicine

## 2017-05-01 MED FILL — DORZOLAMIDE-TIMOLOL EYE DRP: 22.3-6.8 | 30 days supply | Qty: 10 | Fill #1

## 2017-05-01 MED FILL — ATORVASTATIN 40 MG TABLET: 40 | 90 days supply | Qty: 90 | Fill #1

## 2017-05-01 MED FILL — BRIMONIDINE TARTRATE 0.15%: 0.15 | 30 days supply | Qty: 5 | Fill #4

## 2017-05-01 MED FILL — !LANTUS SOLOSTAR 100UNITS/M: 100 | 27 days supply | Qty: 15 | Fill #1

## 2017-05-08 ENCOUNTER — Ambulatory Visit: Payer: Medicare Other | Attending: Family Medicine | Admitting: Family Medicine

## 2017-05-08 ENCOUNTER — Encounter: Payer: Self-pay | Admitting: Family Medicine

## 2017-05-08 VITALS — BP 124/82 | HR 87 | Temp 97.6°F | Ht 66.0 in | Wt 189.0 lb

## 2017-05-08 DIAGNOSIS — Z880 Allergy status to penicillin: Secondary | ICD-10-CM | POA: Insufficient documentation

## 2017-05-08 DIAGNOSIS — E7849 Other hyperlipidemia: Secondary | ICD-10-CM | POA: Diagnosis not present

## 2017-05-08 DIAGNOSIS — Z794 Long term (current) use of insulin: Secondary | ICD-10-CM | POA: Diagnosis not present

## 2017-05-08 DIAGNOSIS — I10 Essential (primary) hypertension: Secondary | ICD-10-CM | POA: Insufficient documentation

## 2017-05-08 DIAGNOSIS — Z79899 Other long term (current) drug therapy: Secondary | ICD-10-CM | POA: Insufficient documentation

## 2017-05-08 DIAGNOSIS — E0800 Diabetes mellitus due to underlying condition with hyperosmolarity without nonketotic hyperglycemic-hyperosmolar coma (NKHHC): Secondary | ICD-10-CM | POA: Diagnosis not present

## 2017-05-08 DIAGNOSIS — Z1211 Encounter for screening for malignant neoplasm of colon: Secondary | ICD-10-CM | POA: Diagnosis not present

## 2017-05-08 DIAGNOSIS — Z885 Allergy status to narcotic agent status: Secondary | ICD-10-CM | POA: Diagnosis not present

## 2017-05-08 DIAGNOSIS — G8929 Other chronic pain: Secondary | ICD-10-CM | POA: Insufficient documentation

## 2017-05-08 DIAGNOSIS — Z7902 Long term (current) use of antithrombotics/antiplatelets: Secondary | ICD-10-CM | POA: Diagnosis not present

## 2017-05-08 DIAGNOSIS — Z888 Allergy status to other drugs, medicaments and biological substances status: Secondary | ICD-10-CM | POA: Diagnosis not present

## 2017-05-08 DIAGNOSIS — R0982 Postnasal drip: Secondary | ICD-10-CM | POA: Insufficient documentation

## 2017-05-08 DIAGNOSIS — E1169 Type 2 diabetes mellitus with other specified complication: Secondary | ICD-10-CM | POA: Insufficient documentation

## 2017-05-08 DIAGNOSIS — Z7982 Long term (current) use of aspirin: Secondary | ICD-10-CM | POA: Diagnosis not present

## 2017-05-08 DIAGNOSIS — M25511 Pain in right shoulder: Secondary | ICD-10-CM

## 2017-05-08 DIAGNOSIS — H409 Unspecified glaucoma: Secondary | ICD-10-CM | POA: Diagnosis not present

## 2017-05-08 DIAGNOSIS — D649 Anemia, unspecified: Secondary | ICD-10-CM | POA: Insufficient documentation

## 2017-05-08 DIAGNOSIS — E1165 Type 2 diabetes mellitus with hyperglycemia: Secondary | ICD-10-CM | POA: Diagnosis present

## 2017-05-08 LAB — POCT GLYCOSYLATED HEMOGLOBIN (HGB A1C): HEMOGLOBIN A1C: 8.8

## 2017-05-08 LAB — GLUCOSE, POCT (MANUAL RESULT ENTRY): POC Glucose: 168 mg/dl — AB (ref 70–99)

## 2017-05-08 MED ORDER — INSULIN GLARGINE 100 UNIT/ML SOLOSTAR PEN: 60.0000 [IU] | PEN_INJECTOR | Freq: Every day | SUBCUTANEOUS | 3 refills | Status: DC

## 2017-05-08 MED ORDER — LOSARTAN POTASSIUM-HCTZ 100-25 MG PO TABS: 1.0000 | ORAL_TABLET | Freq: Every day | ORAL | 3 refills | Status: DC

## 2017-05-08 MED ORDER — ATORVASTATIN CALCIUM 40 MG PO TABS: 40.0000 mg | ORAL_TABLET | Freq: Every day | ORAL | 3 refills | Status: DC

## 2017-05-08 MED ORDER — RANITIDINE HCL 150 MG PO TABS: 150.0000 mg | ORAL_TABLET | Freq: Two times a day (BID) | ORAL | 3 refills | Status: DC

## 2017-05-08 MED ORDER — TRAMADOL HCL 50 MG PO TABS: 50.0000 mg | ORAL_TABLET | Freq: Two times a day (BID) | ORAL | 1 refills | Status: DC | PRN

## 2017-05-08 MED ORDER — METFORMIN HCL 1000 MG PO TABS: 1000.0000 mg | ORAL_TABLET | Freq: Two times a day (BID) | ORAL | 3 refills | Status: DC

## 2017-05-08 MED ORDER — CLOPIDOGREL BISULFATE 75 MG PO TABS: 75.0000 mg | ORAL_TABLET | Freq: Every day | ORAL | 3 refills | Status: DC

## 2017-05-08 MED ORDER — PRODIGY VOICE BLOOD GLUCOSE W/DEVICE KIT: PACK | 0 refills | Status: DC

## 2017-05-08 MED ORDER — CETIRIZINE HCL 10 MG PO TABS: 10.0000 mg | ORAL_TABLET | Freq: Every day | ORAL | 3 refills | Status: DC

## 2017-05-08 MED FILL — traMADol HCL 50 MG TABS: 50 | 15 days supply | Qty: 30 | Fill #0

## 2017-05-08 NOTE — Progress Notes (Signed)
Subjective:  Patient ID: Adrienne [name], female    DOB: [birth year]  Age: 62 y.o. 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 She does not check her blood sugars as she is yet to receive the voice glucometer I had previously ordered. She does not exercise much. Doing well on her statin and denies myalgias or other adverse effects and she tolerates her antihypertensives. I had prescribed estrace cream for vaginal dryness which she had complained about however she never commenced it due to concerns about the side effects.  She complains of chronic right shoulder pain in the anterior and superior aspects which occur about 3-4 times/week with no preceding history of trauma and is described as moderate. She does not use any analgesics for it. Denies radiation of pain or numbness.     Past Medical History:  Diagnosis Date  . Diabetes ([place])   . High cholesterol   . Hypertension     Past Surgical History:  Procedure Laterality Date  . CATARACT EXTRACTION, BILATERAL     Laser eye surgery, cataract surgery bi-lat  . CESAREAN SECTION     [facility], [place]  . EYE SURGERY Bilateral 2012   LASER, CATARACT  . TEE WITHOUT CARDIOVERSION N/A 12/31/2013   Procedure: TRANSESOPHAGEAL ECHOCARDIOGRAM (TEE);  Surgeon: Laverda Page, MD;  Location: [facility];  Service: Cardiovascular;   Laterality: N/A;  H&P in file  . TUBAL LIGATION Bilateral     Allergies  Allergen Reactions  . Codeine Itching  . Hydrocodone Itching  . Niacin And Related Itching  . Oxycodone Itching  . Penicillin G Itching     Outpatient Medications Prior to Visit  Medication Sig Dispense Refill  . aspirin EC 81 MG tablet Take 81 mg by mouth daily.     . blood glucose meter kit and supplies Dispense based on patient and insurance preference. Use up to four times daily as directed. (FOR ICD-9 250.00, 250.01). 1 each 0  . brimonidine (ALPHAGAN) 0.15 % ophthalmic solution Place 1 drop into both eyes 2 (two) times daily.     . dorzolamide-timolol (COSOPT) 22.3-6.8 MG/ML ophthalmic solution Place 1 drop into both eyes 2 (two) times daily.   2  . estradiol (ESTRACE) 0.1 MG/GM vaginal cream Place 1 Applicatorful vaginally at bedtime. For 2 weeks then alternate with Prometrium capsules 42.5 g 1  . Insulin Pen Needle 31G X 5 MM MISC 1 Act by Does not apply route 4 (four) times daily - after meals and at bedtime. 300 each 0  . metoCLOPramide (REGLAN) 10 MG tablet Take 1 tablet (10 mg total) by mouth every 8 (eight) hours as needed for nausea. 90 tablet 1  . progesterone (PROMETRIUM) 100 MG capsule Take 1 capsule (100 mg total) by mouth daily. For 2 weeks after use of Estrace cream 14 capsule 1  . atorvastatin (LIPITOR) 40 MG tablet Take 1  tablet (40 mg total) by mouth daily. 90 tablet 3  . cetirizine (ZYRTEC) 10 MG tablet Take 1 tablet (10 mg total) by mouth daily. 30 tablet 3  . clopidogrel (PLAVIX) 75 MG tablet Take 1 tablet (75 mg total) by mouth daily. 90 tablet 3  . Insulin Glargine (LANTUS SOLOSTAR) 100 UNIT/ML Solostar Pen Inject 55 Units into the skin daily at 10 pm. 5 pen 3  . losartan-hydrochlorothiazide (HYZAAR) 100-25 MG tablet Take 1 tablet by mouth daily. 90 tablet 3  . metFORMIN (GLUCOPHAGE) 1000 MG tablet Take 1 tablet (1,000 mg total) by mouth 2 (two) times daily. 180 tablet 3  . promethazine  (PHENERGAN) 25 MG tablet Take 1 tablet (25 mg total) by mouth every 8 (eight) hours as needed for nausea or vomiting. 20 tablet 0  . ranitidine (ZANTAC) 150 MG tablet Take 1 tablet (150 mg total) by mouth 2 (two) times daily. 60 tablet 1  . glucose blood test strip Use as instructed (Patient not taking: Reported on 01/23/2017) 100 each 12  . insulin aspart (NOVOLOG FLEXPEN) 100 UNIT/ML FlexPen Inject 0-12 Units into the skin 3 (three) times daily before meals. As per sliding scale (Patient not taking: Reported on 05/08/2017) 15 mL 3  . Blood Glucose Monitoring Suppl (PRODIGY VOICE BLOOD GLUCOSE) w/Device KIT Use as directed (Patient not taking: Reported on 01/23/2017) 1 kit 0   No facility-administered medications prior to visit.     ROS Review of Systems  Constitutional: Negative for activity change, appetite change and fatigue.  HENT: Negative for congestion, sinus pressure and sore throat.   Eyes: Positive for visual disturbance.  Respiratory: Negative for cough, chest tightness, shortness of breath and wheezing.   Cardiovascular: Negative for chest pain and palpitations.  Gastrointestinal: Negative for abdominal distention, abdominal pain and constipation.  Endocrine: Negative for polydipsia.  Genitourinary: Negative for dysuria and frequency.  Musculoskeletal:       See hpi  Skin: Negative for rash.  Neurological: Negative for tremors, light-headedness and numbness.  Hematological: Does not bruise/bleed easily.  Psychiatric/Behavioral: Negative for agitation and behavioral problems.    Objective:  BP 124/82   Pulse 87   Temp 97.6 F (36.4 C) (Oral)   Ht _0  (1.676 m)   Wt 189 lb (85.7 kg)   SpO2 97%   BMI 30.51 kg/m   BP/Weight 05/08/2017 01/23/2017 09/21/7094  Systolic BP 283 662 947  Diastolic BP 82 84 72  Wt. (Lbs) 189 187.2 195  BMI 30.51 30.21 31.47      Physical Exam  Constitutional: She is oriented to person, place, and time. She appears well-developed and  well-nourished.  HENT:  Right Ear: External ear normal.  Left Ear: External ear normal.  Mouth/Throat: Oropharynx is clear and moist.  Eyes:  Visually impaired  Cardiovascular: Normal rate, normal heart sounds and intact distal pulses.  No murmur heard. Pulmonary/Chest: Effort normal and breath sounds normal. She has no wheezes. She has no rales. She exhibits no tenderness.  Abdominal: Soft. Bowel sounds are normal. She exhibits no distension and no mass. There is no tenderness.  Musculoskeletal: Normal range of motion. She exhibits tenderness (TTP of anterior and superior aspect of right shoulder).  Neurological: She is alert and oriented to person, place, and time.  Skin: Skin is warm and dry.  Psychiatric: She has a normal mood and affect.    CMP Latest Ref Rng & Units 08/29/2016 06/17/2016 05/04/2016  Glucose 65 - 99 mg/dL 184(H) 262(H) 204(H)  BUN  8 - 27 mg/dL _0 Creatinine 0.57 - 1.00 mg/dL 1.15(H) 1.36(H) 0.89  Sodium 134 - 144 mmol/L 137 139 139  Potassium 3.5 - 5.2 mmol/L 4.3 4.4 4.3  Chloride 96 - 106 mmol/L 99 96 100  CO2 20 - 29 mmol/L _1 Calcium 8.7 - 10.3 mg/dL 9.2 9.6 9.3  Total Protein 6.0 - 8.5 g/dL 6.8 - 6.7  Total Bilirubin 0.0 - 1.2 mg/dL 0.2 - 0.3  Alkaline Phos 39 - 117 IU/L 105 - 97  AST 0 - 40 IU/L 14 - 14  ALT 0 - 32 IU/L 13 - 16    Lab Results  Component Value Date   HGBA1C 8.8 05/08/2017    Assessment & Plan:   1. Type 2 diabetes mellitus with other specified complication, with long-term current use of insulin (HCC) Not at goal with A1c of 8.8 Lantus increased to 55 units Diabetic diet, lifestyle modifications - POCT glucose (manual entry) - POCT glycosylated hemoglobin (Hb A1C) - clopidogrel (PLAVIX) 75 MG tablet; Take 1 tablet (75 mg total) by mouth daily.  Dispense: 30 tablet; Refill: 3 - Insulin Glargine (LANTUS SOLOSTAR) 100 UNIT/ML Solostar Pen; Inject 60 Units into the skin daily at 10 pm.  Dispense: 5 pen; Refill: 3 -  metFORMIN (GLUCOPHAGE) 1000 MG tablet; Take 1 tablet (1,000 mg total) by mouth 2 (two) times daily.  Dispense: 180 tablet; Refill: 3 - CMP14+EGFR; Future - Lipid panel; Future  2. Other hyperlipidemia Controlled Low cholesterol diet - atorvastatin (LIPITOR) 40 MG tablet; Take 1 tablet (40 mg total) by mouth daily.  Dispense: 30 tablet; Refill: 3 - clopidogrel (PLAVIX) 75 MG tablet; Take 1 tablet (75 mg total) by mouth daily.  Dispense: 30 tablet; Refill: 3  3. Post-nasal drip Stable - cetirizine (ZYRTEC) 10 MG tablet; Take 1 tablet (10 mg total) by mouth daily.  Dispense: 30 tablet; Refill: 3  4. Essential hypertension Controlled Low sodium, DASH diet - losartan-hydrochlorothiazide (HYZAAR) 100-25 MG tablet; Take 1 tablet by mouth daily.  Dispense: 30 tablet; Refill: 3  5. Chronic right shoulder pain Holding off on NSAIDS as she is on Plavix - traMADol (ULTRAM) 50 MG tablet; Take 1 tablet (50 mg total) by mouth every 12 (twelve) hours as needed.  Dispense: 30 tablet; Refill: 1  6. Screening for colon cancer - Ambulatory referral to Gastroenterology   Meds ordered this encounter  Medications  . Blood Glucose Monitoring Suppl (PRODIGY VOICE BLOOD GLUCOSE) w/Device KIT    Sig: Use as directed    Dispense:  1 kit    Refill:  0  . atorvastatin (LIPITOR) 40 MG tablet    Sig: Take 1 tablet (40 mg total) by mouth daily.    Dispense:  30 tablet    Refill:  3  . clopidogrel (PLAVIX) 75 MG tablet    Sig: Take 1 tablet (75 mg total) by mouth daily.    Dispense:  30 tablet    Refill:  3  . cetirizine (ZYRTEC) 10 MG tablet    Sig: Take 1 tablet (10 mg total) by mouth daily.    Dispense:  30 tablet    Refill:  3  . Insulin Glargine (LANTUS SOLOSTAR) 100 UNIT/ML Solostar Pen    Sig: Inject 60 Units into the skin daily at 10 pm.    Dispense:  5 pen    Refill:  3    Dispense pens- visually impaired.  Marland Kitchen losartan-hydrochlorothiazide (HYZAAR) 100-25 MG tablet  Sig: Take 1 tablet by  mouth daily.    Dispense:  30 tablet    Refill:  3  . metFORMIN (GLUCOPHAGE) 1000 MG tablet    Sig: Take 1 tablet (1,000 mg total) by mouth 2 (two) times daily.    Dispense:  180 tablet    Refill:  3  . ranitidine (ZANTAC) 150 MG tablet    Sig: Take 1 tablet (150 mg total) by mouth 2 (two) times daily.    Dispense:  60 tablet    Refill:  3  . traMADol (ULTRAM) 50 MG tablet    Sig: Take 1 tablet (50 mg total) by mouth every 12 (twelve) hours as needed.    Dispense:  30 tablet    Refill:  1    Follow-up: Return in about 3 months (around 08/07/2017) for follow up on diabetes mellitus.   Charlott Rakes MD

## 2017-05-08 NOTE — Patient Instructions (Signed)
Diabetes Mellitus and Nutrition When you have diabetes (diabetes mellitus), it is very important to have healthy eating habits because your blood sugar (glucose) levels are greatly affected by what you eat and drink. Eating healthy foods in the appropriate amounts, at about the same times every day, can help you:  Control your blood glucose.  Lower your risk of heart disease.  Improve your blood pressure.  Reach or maintain a healthy weight.  Every person with diabetes is different, and each person has different needs for a meal plan. Your health care provider may recommend that you work with a diet and nutrition specialist (dietitian) to make a meal plan that is best for you. Your meal plan may vary depending on factors such as:  The calories you need.  The medicines you take.  Your weight.  Your blood glucose, blood pressure, and cholesterol levels.  Your activity level.  Other health conditions you have, such as heart or kidney disease.  How do carbohydrates affect me? Carbohydrates affect your blood glucose level more than any other type of food. Eating carbohydrates naturally increases the amount of glucose in your blood. Carbohydrate counting is a method for keeping track of how many carbohydrates you eat. Counting carbohydrates is important to keep your blood glucose at a healthy level, especially if you use insulin or take certain oral diabetes medicines. It is important to know how many carbohydrates you can safely have in each meal. This is different for every person. Your dietitian can help you calculate how many carbohydrates you should have at each meal and for snack. Foods that contain carbohydrates include:  Bread, cereal, rice, pasta, and crackers.  Potatoes and corn.  Peas, beans, and lentils.  Milk and yogurt.  Fruit and juice.  Desserts, such as cakes, cookies, ice cream, and candy.  How does alcohol affect me? Alcohol can cause a sudden decrease in blood  glucose (hypoglycemia), especially if you use insulin or take certain oral diabetes medicines. Hypoglycemia can be a life-threatening condition. Symptoms of hypoglycemia (sleepiness, dizziness, and confusion) are similar to symptoms of having too much alcohol. If your health care provider says that alcohol is safe for you, follow these guidelines:  Limit alcohol intake to no more than 1 drink per day for nonpregnant women and 2 drinks per day for men. One drink equals 12 oz of beer, 5 oz of wine, or 1 oz of hard liquor.  Do not drink on an empty stomach.  Keep yourself hydrated with water, diet soda, or unsweetened iced tea.  Keep in mind that regular soda, juice, and other mixers may contain a lot of sugar and must be counted as carbohydrates.  What are tips for following this plan? Reading food labels  Start by checking the serving size on the label. The amount of calories, carbohydrates, fats, and other nutrients listed on the label are based on one serving of the food. Many foods contain more than one serving per package.  Check the total grams (g) of carbohydrates in one serving. You can calculate the number of servings of carbohydrates in one serving by dividing the total carbohydrates by 15. For example, if a food has 30 g of total carbohydrates, it would be equal to 2 servings of carbohydrates.  Check the number of grams (g) of saturated and trans fats in one serving. Choose foods that have low or no amount of these fats.  Check the number of milligrams (mg) of sodium in one serving. Most people   should limit total sodium intake to less than 2,300 mg per day.  Always check the nutrition information of foods labeled as "low-fat" or "nonfat". These foods may be higher in added sugar or refined carbohydrates and should be avoided.  Talk to your dietitian to identify your daily goals for nutrients listed on the label. Shopping  Avoid buying canned, premade, or processed foods. These  foods tend to be high in fat, sodium, and added sugar.  Shop around the outside edge of the grocery store. This includes fresh fruits and vegetables, bulk grains, fresh meats, and fresh dairy. Cooking  Use low-heat cooking methods, such as baking, instead of high-heat cooking methods like deep frying.  Cook using healthy oils, such as olive, canola, or sunflower oil.  Avoid cooking with butter, cream, or high-fat meats. Meal planning  Eat meals and snacks regularly, preferably at the same times every day. Avoid going long periods of time without eating.  Eat foods high in fiber, such as fresh fruits, vegetables, beans, and whole grains. Talk to your dietitian about how many servings of carbohydrates you can eat at each meal.  Eat 4-6 ounces of lean protein each day, such as lean meat, chicken, fish, eggs, or tofu. 1 ounce is equal to 1 ounce of meat, chicken, or fish, 1 egg, or 1/4 cup of tofu.  Eat some foods each day that contain healthy fats, such as avocado, nuts, seeds, and fish. Lifestyle   Check your blood glucose regularly.  Exercise at least 30 minutes 5 or more days each week, or as told by your health care provider.  Take medicines as told by your health care provider.  Do not use any products that contain nicotine or tobacco, such as cigarettes and e-cigarettes. If you need help quitting, ask your health care provider.  Work with a counselor or diabetes educator to identify strategies to manage stress and any emotional and social challenges. What are some questions to ask my health care provider?  Do I need to meet with a diabetes educator?  Do I need to meet with a dietitian?  What number can I call if I have questions?  When are the best times to check my blood glucose? Where to find more information:  American Diabetes Association: diabetes.org/food-and-fitness/food  Academy of Nutrition and Dietetics:  www.eatright.org/resources/health/diseases-and-conditions/diabetes  National Institute of Diabetes and Digestive and Kidney Diseases (NIH): www.niddk.nih.gov/health-information/diabetes/overview/diet-eating-physical-activity Summary  A healthy meal plan will help you control your blood glucose and maintain a healthy lifestyle.  Working with a diet and nutrition specialist (dietitian) can help you make a meal plan that is best for you.  Keep in mind that carbohydrates and alcohol have immediate effects on your blood glucose levels. It is important to count carbohydrates and to use alcohol carefully. This information is not intended to replace advice given to you by your health care provider. Make sure you discuss any questions you have with your health care provider. Document Released: 09/30/2004 Document Revised: 02/08/2016 Document Reviewed: 02/08/2016 Elsevier Interactive Patient Education  2018 Elsevier Inc.  

## 2017-05-09 ENCOUNTER — Telehealth: Payer: Self-pay

## 2017-05-09 ENCOUNTER — Other Ambulatory Visit: Payer: Self-pay | Admitting: Pharmacist

## 2017-05-09 MED ORDER — PRODIGY VOICE BLOOD GLUCOSE W/DEVICE KIT: PACK | 0 refills | Status: DC

## 2017-05-09 NOTE — Telephone Encounter (Signed)
JA 

## 2017-05-09 NOTE — Telephone Encounter (Signed)
The patient has an order for a prodigy glucometer.   Attempted to contact the patient to inform her that glucometers are covered by medicare Part B and [facility] pharmacy does not bill medicare part B, she would need to select another pharmacy to place the order for her prodigy meter.  Call placed to #  # [phone] and her sister said that she was not home.  Call placed to # [phone] to inquire if the patient has a pharmacy that she prefers to use for the glucometer order and a HIPAA compliant voicemail message was left requesting a call back to #[phone].

## 2017-05-10 ENCOUNTER — Ambulatory Visit: Payer: Medicare Other | Attending: Family Medicine

## 2017-05-10 DIAGNOSIS — Z794 Long term (current) use of insulin: Secondary | ICD-10-CM | POA: Diagnosis not present

## 2017-05-10 DIAGNOSIS — E1169 Type 2 diabetes mellitus with other specified complication: Secondary | ICD-10-CM | POA: Diagnosis not present

## 2017-05-10 NOTE — Progress Notes (Signed)
Patient here for lab visit only 

## 2017-05-11 ENCOUNTER — Telehealth: Payer: Self-pay

## 2017-05-11 ENCOUNTER — Other Ambulatory Visit: Payer: Self-pay | Admitting: Family Medicine

## 2017-05-11 DIAGNOSIS — E7849 Other hyperlipidemia: Secondary | ICD-10-CM

## 2017-05-11 LAB — LIPID PANEL
CHOL/HDL RATIO: 5.2 ratio — AB (ref 0.0–4.4)
CHOLESTEROL TOTAL: 243 mg/dL — AB (ref 100–199)
HDL: 47 mg/dL (ref >39)
LDL CALC: 167 mg/dL — AB (ref 0–99)
TRIGLYCERIDES: 144 mg/dL (ref 0–149)
VLDL CHOLESTEROL CAL: 29 mg/dL (ref 5–40)

## 2017-05-11 LAB — CMP14+EGFR
A/G RATIO: 1.8 (ref 1.2–2.2)
ALT: 11 IU/L (ref 0–32)
AST: 13 IU/L (ref 0–40)
Albumin: 4.4 g/dL (ref 3.6–4.8)
Alkaline Phosphatase: 73 IU/L (ref 39–117)
BILIRUBIN TOTAL: 0.3 mg/dL (ref 0.0–1.2)
BUN/Creatinine Ratio: 19 (ref 12–28)
BUN: 28 mg/dL — ABNORMAL HIGH (ref 8–27)
CO2: 22 mmol/L (ref 20–29)
Calcium: 9.9 mg/dL (ref 8.7–10.3)
Chloride: 103 mmol/L (ref 96–106)
Creatinine, Ser: 1.44 mg/dL — ABNORMAL HIGH (ref 0.57–1.00)
GFR, EST AFRICAN AMERICAN: 45 mL/min/{1.73_m2} — AB (ref >59)
GFR, EST NON AFRICAN AMERICAN: 39 mL/min/{1.73_m2} — AB (ref >59)
Globulin, Total: 2.5 g/dL (ref 1.5–4.5)
Glucose: 101 mg/dL — ABNORMAL HIGH (ref 65–99)
POTASSIUM: 4.4 mmol/L (ref 3.5–5.2)
SODIUM: 141 mmol/L (ref 134–144)
Total Protein: 6.9 g/dL (ref 6.0–8.5)

## 2017-05-11 MED ORDER — ATORVASTATIN CALCIUM 80 MG PO TABS: 80.0000 mg | ORAL_TABLET | Freq: Every day | ORAL | 1 refills | Status: DC

## 2017-05-11 NOTE — Telephone Encounter (Signed)
Patient was called and informed to contact office for lab results. 

## 2017-05-15 MED FILL — LOSARTAN-HCTZ 100-25 MG TAB: 100-25 | 90 days supply | Qty: 90 | Fill #0

## 2017-05-15 MED FILL — metFORMIN HCL 1000 MG TABS: 1000 | 90 days supply | Qty: 180 | Fill #0

## 2017-05-22 DIAGNOSIS — H401123 Primary open-angle glaucoma, left eye, severe stage: Secondary | ICD-10-CM | POA: Diagnosis not present

## 2017-05-22 DIAGNOSIS — H4051X3 Glaucoma secondary to other eye disorders, right eye, severe stage: Secondary | ICD-10-CM | POA: Diagnosis not present

## 2017-05-22 DIAGNOSIS — H3411 Central retinal artery occlusion, right eye: Secondary | ICD-10-CM | POA: Diagnosis not present

## 2017-05-22 DIAGNOSIS — E113513 Type 2 diabetes mellitus with proliferative diabetic retinopathy with macular edema, bilateral: Secondary | ICD-10-CM | POA: Diagnosis not present

## 2017-05-22 DIAGNOSIS — Z794 Long term (current) use of insulin: Secondary | ICD-10-CM | POA: Diagnosis not present

## 2017-05-29 ENCOUNTER — Telehealth: Payer: Self-pay | Admitting: Family Medicine

## 2017-05-29 NOTE — Telephone Encounter (Signed)
Will route to PCP 

## 2017-05-29 NOTE — Telephone Encounter (Signed)
Patient called and requested for something for panic attacks,patient was informed and verbalized understanding pcp may request for an office visit,  Patient stated she has trouble getting from place to place. [facility] pharmacy

## 2017-05-30 MED ORDER — HYDROXYZINE HCL 25 MG PO TABS: 25.0000 mg | ORAL_TABLET | Freq: Three times a day (TID) | ORAL | 1 refills | Status: DC | PRN

## 2017-05-30 NOTE — Telephone Encounter (Signed)
Hydroxyzine sent to pharmacy 

## 2017-05-31 MED FILL — hydrOXYzine HCL 25 MG TABS: 25 | 20 days supply | Qty: 60 | Fill #0

## 2017-05-31 NOTE — Telephone Encounter (Signed)
Patient was informed and verbalized understanding.

## 2017-06-05 MED FILL — raNITIdine HCL 150 MG TABS: 150 | 90 days supply | Qty: 180 | Fill #0

## 2017-06-05 MED FILL — !LANTUS SOLOSTAR 100UNITS/M: 100 | 25 days supply | Qty: 15 | Fill #0

## 2017-06-08 ENCOUNTER — Encounter: Payer: Self-pay | Admitting: Family Medicine

## 2017-06-13 MED FILL — DORZOLAMIDE-TIMOLOL EYE DRP: 22.3-6.8 | 30 days supply | Qty: 10 | Fill #2

## 2017-06-19 ENCOUNTER — Telehealth: Payer: Self-pay | Admitting: *Deleted

## 2017-06-19 NOTE — Telephone Encounter (Signed)
Patient states she had a wellness check and sugar level was too low and was advised to be seen. Patient advised to bring all medication to visit. Patient advised to complete another wellness check if she feels her sugar is low this evening. Patient is scheduled for 06/20/17 at 2pm with CPP.

## 2017-06-20 ENCOUNTER — Ambulatory Visit: Payer: Medicare Other | Admitting: Pharmacist

## 2017-06-20 ENCOUNTER — Telehealth: Payer: Self-pay | Admitting: Pharmacist

## 2017-06-20 ENCOUNTER — Telehealth: Payer: Self-pay | Admitting: *Deleted

## 2017-06-20 NOTE — Telephone Encounter (Signed)
I will see her at her appointment in 2 days.

## 2017-06-20 NOTE — Telephone Encounter (Signed)
PC placed to Adrienne [name]. She missed appointment today d/t panic attack. Was to f/u with me concerning hypoglycemia episode over the weekend. I called to see how she was feeling. Pt states that she believes her panic attack was brought on by her medication. While hydroxyzine can cause paradoxical reactions these are rarely seen in adults. Will forward information to patient's PCP. She has a walk-in appointment scheduled on 06/22/17.

## 2017-06-20 NOTE — Telephone Encounter (Signed)
Patient called stating she is having a panic attack and would like to speak to someone.

## 2017-06-20 NOTE — Telephone Encounter (Signed)
Call was transferred to writer referencing patient being unable to make her appointment she scheduled for today duet to a panic attack. Pt  states she felt onset of the panic attack when she got in the car to come to [facility]. No triggers prior to that. She just felt darkness while sitting in the car. She has taken medication prescribed for anxiety: Hydroxyzine, but this is not helping.   Noted that patient was coming in to follow up with her blood sugars. She left message on yesterday that readings for blood sugar were low. When asked patient the reading for the blood sugar today she was unable to verbalize a number. She does not have a meter that reads numbers out loud.  She just stated she knows they are okay because she is asymptomatic.  Call was transferred to [facility] to re-schedule appointment for patient.

## 2017-06-20 NOTE — Telephone Encounter (Signed)
I will see her at her upcoming appointment in 2 days

## 2017-06-20 NOTE — Telephone Encounter (Signed)
Please see encounter initiated by Carilyn Goodpasture for response

## 2017-06-22 ENCOUNTER — Telehealth: Payer: Self-pay | Admitting: Licensed Clinical Social Worker

## 2017-06-22 ENCOUNTER — Ambulatory Visit: Payer: Medicare HMO

## 2017-06-22 NOTE — Telephone Encounter (Signed)
I will make attempts to follow up with pt regarding her behavioral health.
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
## 2017-06-22 NOTE — Telephone Encounter (Signed)
LCSWA attempted to follow up with pt on behavioral health concerns. A message was left requesting a return call.

## 2017-06-26 ENCOUNTER — Telehealth: Payer: Self-pay | Admitting: Licensed Clinical Social Worker

## 2017-06-26 NOTE — Telephone Encounter (Signed)
Incoming call received from pt stating that she has been experiencing ongoing anxiety, racing thoughts, and has had difficulty sleeping. Pt reports sleeping for approximately 10 minutes to 2 hours at at time.   Pt disclosed that she was advised by Pharmacist to discontinue taking her anxiety medication due to side effects of increasing symptoms. She would like to be prescribed medication to assist with sleep.   LCSWA consulted with [facility] Pharmacist, Abbie Sons, who denied encouraging pt to discontinue medications and suggested that pt address concerns with provider.   LCSWA consulted with PCP, Dr. Margarita Rana, who advised pt to continue taking anxiety medication as prescribed to assist with anxiety and sleep. Pt was strongly encouraged to come to scheduled upcoming appointment. She was offered an earlier appointment date; however, pt stated that she was unable to accommodate the change. Appointment remains scheduled for 06/29/17 at 2:10 PM. LCSWA inquired about transportation barriers; to which, pt denied having any concerns.

## 2017-06-27 ENCOUNTER — Telehealth: Payer: Self-pay | Admitting: Family Medicine

## 2017-06-27 NOTE — Telephone Encounter (Signed)
Call placed to [facility] from [place] [phone] regarding patient. Spoke with Kennyth Lose and informed her that patient had been speaking with Delana Meyer, Education officer, museum, and had expressed that she needed help at home since her vision is getting worst. Kennyth Lose informed me that she had gone to see patient at her home (several times) but organization is unable to further assist patient since she no longer has Medicaid as her primary insurance. Patient now has Medicare due to an increase in her income. Kennyth Lose stated that she will try to contact patient later this week to follow up and find other ways to help her.

## 2017-06-28 ENCOUNTER — Ambulatory Visit: Payer: Medicare HMO

## 2017-06-29 ENCOUNTER — Ambulatory Visit: Payer: Medicare HMO

## 2017-07-01 ENCOUNTER — Other Ambulatory Visit: Payer: Self-pay

## 2017-07-01 ENCOUNTER — Encounter (HOSPITAL_COMMUNITY): Payer: Self-pay | Admitting: *Deleted

## 2017-07-01 ENCOUNTER — Emergency Department (HOSPITAL_COMMUNITY)
Admission: EM | Admit: 2017-07-01 | Discharge: 2017-07-01 | Disposition: A | Discharge status: Home / Self Care | Payer: Medicare HMO | Attending: Emergency Medicine | Admitting: Emergency Medicine

## 2017-07-01 DIAGNOSIS — I10 Essential (primary) hypertension: Secondary | ICD-10-CM | POA: Insufficient documentation

## 2017-07-01 DIAGNOSIS — E119 Type 2 diabetes mellitus without complications: Secondary | ICD-10-CM | POA: Diagnosis not present

## 2017-07-01 DIAGNOSIS — Z79899 Other long term (current) drug therapy: Secondary | ICD-10-CM | POA: Diagnosis not present

## 2017-07-01 DIAGNOSIS — G4700 Insomnia, unspecified: Secondary | ICD-10-CM | POA: Diagnosis not present

## 2017-07-01 DIAGNOSIS — Z794 Long term (current) use of insulin: Secondary | ICD-10-CM | POA: Diagnosis not present

## 2017-07-01 DIAGNOSIS — F419 Anxiety disorder, unspecified: Secondary | ICD-10-CM | POA: Diagnosis present

## 2017-07-01 MED ORDER — TRAZODONE HCL 50 MG PO TABS: 50.0000 mg | ORAL_TABLET | Freq: Every evening | ORAL | 0 refills | Status: DC | PRN

## 2017-07-01 NOTE — ED Triage Notes (Signed)
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 Patient states she can not sleep due to her anxiety.

## 2017-07-01 NOTE — ED Provider Notes (Signed)
[facility] DEPT Provider Note   CSN: [id number] Arrival date & time: 07/01/17  1848     History   Chief Complaint Chief Complaint  Patient presents with  . Anxiety    HPI Adrienne [name] is a 62 y.o. female.  HPI   Patient presents for evaluation of anxiety, with a sensation of panic and fear of the dark.  She is partially blind.  She feels like her panic is caused by sleeplessness.  She has been prescribed hydroxyzine, but does not like to take it because it seems to make her anxiety worse.  She denies chest pain, shortness of breath, weakness or dizziness.  She has almost complete vision loss, only sees light, and feels like that is a large because of her anxiety.  She missed her appointment with her doctor, 2 days ago because she was "having an anxiety attack."  She is here with a friend who is helping her.  There are no other known modifying factors.  Past Medical History:  Diagnosis Date  . Diabetes ([place])   . High cholesterol   . Hypertension     Patient Active Problem List   Diagnosis Date Noted  . Gastroparesis 05/04/2016  . Anemia 10/05/2015  . Fall 09/06/2015  . DKA (diabetic ketoacidoses) ([place]) 09/06/2015  . Hyperkalemia 09/05/2015  . Essential hypertension 12/20/2013  . Diabetes mellitus ([place]) 11/19/2013  . Other hyperlipidemia 11/19/2013  . 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 CATARACT EXTRACTION, BILATERAL     Laser eye surgery, cataract surgery bi-lat  . CESAREAN SECTION     [facility], [place]  . EYE SURGERY Bilateral 2012   LASER, CATARACT  . TEE WITHOUT CARDIOVERSION N/A 12/31/2013   Procedure: TRANSESOPHAGEAL ECHOCARDIOGRAM (TEE);  Surgeon: Laverda Page, MD;  Location: [facility];  Service: Cardiovascular;  Laterality: N/A;  H&P in file  . TUBAL LIGATION Bilateral      OB History   None      Home Medications    Prior to  Admission medications   Medication Sig Start Date End Date Taking? Authorizing Provider  aspirin EC 81 MG tablet Take 81 mg by mouth daily.     [provider]  atorvastatin (LIPITOR) 80 MG tablet Take 1 tablet (80 mg total) by mouth daily. 05/11/17   Charlott Rakes, MD  blood glucose meter kit and supplies Dispense based on patient and insurance preference. Use up to four times daily as directed. (FOR ICD-9 250.00, 250.01). 09/08/15   Lavina Hamman, MD  Blood Glucose Monitoring Suppl (PRODIGY VOICE BLOOD GLUCOSE) w/Device KIT Use as directed. 05/09/17   Charlott Rakes, MD  brimonidine (ALPHAGAN) 0.15 % ophthalmic solution Place 1 drop into both eyes 2 (two) times daily.  05/20/15   [provider]  cetirizine (ZYRTEC) 10 MG tablet Take 1 tablet (10 mg total) by mouth daily. 05/08/17   Charlott Rakes, MD  clopidogrel (PLAVIX) 75 MG tablet Take 1 tablet (75 mg total) by mouth daily. 05/08/17   Charlott Rakes, MD  dorzolamide-timolol (COSOPT) 22.3-6.8 MG/ML ophthalmic solution Place 1 drop into both eyes 2 (two) times daily.  08/05/15   [provider]  estradiol (ESTRACE) 0.1 MG/GM vaginal cream Place 1 Applicatorful vaginally at bedtime. For 2 weeks then alternate with Prometrium capsules 01/23/17   Charlott Rakes, MD  glucose blood test strip Use as instructed Patient not taking: Reported on 01/23/2017 07/19/16  Charlott Rakes, MD  hydrOXYzine (ATARAX/VISTARIL) 25 MG tablet Take 1 tablet (25 mg total) by mouth 3 (three) times daily as needed for anxiety. 05/30/17   Charlott Rakes, MD  insulin aspart (NOVOLOG FLEXPEN) 100 UNIT/ML FlexPen Inject 0-12 Units into the skin 3 (three) times daily before meals. As per sliding scale Patient not taking: Reported on 05/08/2017 01/23/17   Charlott Rakes, MD  Insulin Glargine (LANTUS SOLOSTAR) 100 UNIT/ML Solostar Pen Inject 60 Units into the skin daily at 10 pm. 05/08/17   Charlott Rakes, MD  Insulin Pen Needle 31G X 5 MM MISC 1 Act by Does  not apply route 4 (four) times daily - after meals and at bedtime. 09/08/15   Lavina Hamman, MD  losartan-hydrochlorothiazide (HYZAAR) 100-25 MG tablet Take 1 tablet by mouth daily. 05/08/17   Charlott Rakes, MD  metFORMIN (GLUCOPHAGE) 1000 MG tablet Take 1 tablet (1,000 mg total) by mouth 2 (two) times daily. 05/08/17   Charlott Rakes, MD  metoCLOPramide (REGLAN) 10 MG tablet Take 1 tablet (10 mg total) by mouth every 8 (eight) hours as needed for nausea. 08/29/16   Argentina Donovan, PA-C  progesterone (PROMETRIUM) 100 MG capsule Take 1 capsule (100 mg total) by mouth daily. For 2 weeks after use of Estrace cream 01/23/17   Charlott Rakes, MD  ranitidine (ZANTAC) 150 MG tablet Take 1 tablet (150 mg total) by mouth 2 (two) times daily. 05/08/17   Charlott Rakes, MD  traMADol (ULTRAM) 50 MG tablet Take 1 tablet (50 mg total) by mouth every 12 (twelve) hours as needed. 05/08/17   Charlott Rakes, MD  traZODone (DESYREL) 50 MG tablet Take 1 tablet (50 mg total) by mouth at bedtime as needed for sleep. 07/01/17   Daleen Bo, MD    Family History Family History  Problem Relation Age of Onset  . Dementia Mother   . Diabetes Father   . Hypertension Father   . Hypertension Sister   . Diabetes Sister   . Diabetes Sister   . Heart disease Sister     Social History Social History   Tobacco Use  . Smoking status: Former Research scientist (life sciences)  . Smokeless tobacco: Never Used  . Tobacco comment: QUIT IN 1987  Substance Use Topics  . Alcohol use: No    Alcohol/week: 0.0 oz    Comment: QUIT IN 1987  . Drug use: No    Frequency: 2.0 times per week    Comment: QUIT 1980     Allergies   Codeine; Hydrocodone; Oxycodone; Penicillins; Niacin and related; Penicillin g; and Niacin   Review of Systems Review of Systems  All other systems reviewed and are negative.    Physical Exam Updated Vital Signs BP (!) 150/85   Pulse 82   Temp 98.1 F (36.7 C) (Oral)   Resp 17   Ht _0  (1.676 m)   Wt 88.9  kg (196 lb)   SpO2 100%   BMI 31.64 kg/m   Physical Exam  Constitutional: She is oriented to person, place, and time. She appears well-developed and well-nourished.  HENT:  Head: Normocephalic and atraumatic.  Eyes:  Right pupil large and deformed, with opacity of the cornea left pupil 3 to 4 mm, not reactive.  Neck: Normal range of motion and phonation normal. Neck supple.  Cardiovascular: Normal rate.  Pulmonary/Chest: Effort normal.  Musculoskeletal: Normal range of motion.  Neurological: She is alert and oriented to person, place, and time. She exhibits normal muscle tone.  Skin: Skin is  warm and dry.  Psychiatric: Her behavior is normal.  Anxious  Nursing note and vitals reviewed.    ED Treatments / Results  Labs (all labs ordered are listed, but only abnormal results are displayed) Labs Reviewed - No data to display  EKG None  Radiology No results found.  Procedures Procedures (including critical care time)  Medications Ordered in ED Medications - No data to display   Initial Impression / Assessment and Plan / ED Course  I have reviewed the triage vital signs and the nursing notes.  Pertinent labs & imaging results that were available during my care of the patient were reviewed by me and considered in my medical decision making (see chart for details).      Patient Vitals for the past 24 hrs:  BP Temp Temp src Pulse Resp SpO2 Height Weight  07/01/17 2100 (!) 150/85 - - 82 17 100 % - -  07/01/17 2027 - - - - - - _0  (1.676 m) 88.9 kg (196 lb)  07/01/17 1913 (!) 164/114 98.1 F (36.7 C) Oral 91 18 100 % - -    9:41 PM Reevaluation with update and discussion. After initial assessment and treatment, an updated evaluation reveals no change in status, findings discussed with patient and the person with her, all questions answered. Daleen Bo   Medical Decision Making: Anxiety, nonspecific with insomnia.  Patient is not currently seeing a therapist and  would likely help to do that.  We informed her of this.  She stated that she will talk to her PCP about it.  CRITICAL CARE-no Performed by: Daleen Bo  Nursing Notes Reviewed/ Care Coordinated Applicable Imaging Reviewed Interpretation of Laboratory Data incorporated into ED treatment  The patient appears reasonably screened and/or stabilized for discharge and I doubt any other medical condition or other [facility] requiring further screening, evaluation, or treatment in the ED at this time prior to discharge.  Plan: Home Medications-continue usual medications; Home Treatments-rest, sleep hygiene; return here if the recommended treatment, does not improve the symptoms; Recommended follow up-PCP as soon as possible for further evaluation and treatment.  Consider therapy.   Final Clinical Impressions(s) / ED Diagnoses   Final diagnoses:  Insomnia, unspecified type    ED Discharge Orders        Ordered    traZODone (DESYREL) 50 MG tablet  At bedtime PRN     07/01/17 2137       Daleen Bo, MD 07/01/17 2143

## 2017-07-01 NOTE — Discharge Instructions (Addendum)
We are prescribing a sleeping pill to help you, until you can get back with your doctor.  Your doctor wanted you to take hydroxyzine to help with your anxiety and sleep.  Since you do not want to take it is a good idea to talk to your doctor as soon as possible to arrange for further care and treatment.

## 2017-07-01 NOTE — ED Triage Notes (Signed)
Pt states she is having anxiety attacks that are worse since taking hydroxyzine. Pt states she is having trouble sleeping. Pt is partially blind, states the darkness is bothering her.

## 2017-07-07 ENCOUNTER — Telehealth: Payer: Self-pay | Admitting: Licensed Clinical Social Worker

## 2017-07-07 NOTE — Telephone Encounter (Signed)
Incoming call received from patient. Pt stated that her anxiety has not resolved, resulting in a recent visit to the ED. She was encouraged to schedule appointment with PCP and prescribed medication. Pt states that the medication is not working.   Pt reports making an attempt today to schedule appointment with provider; however, there is no availability. She reported willingness to come to [facility] and wait, in the case of a no-show to meet with another provider; however, LCSWA was informed of no availability.   LCSWA consulted with pt's PCP who suggested pt schedule an appointment with walk-in provider for next week. An appointment was scheduled for Wednesday, June 26, 19.

## 2017-07-10 ENCOUNTER — Telehealth: Payer: Self-pay | Admitting: Family Medicine

## 2017-07-10 MED ORDER — TRAZODONE HCL 50 MG PO TABS: 50.0000 mg | ORAL_TABLET | Freq: Every evening | ORAL | 0 refills | Status: DC | PRN

## 2017-07-10 NOTE — Telephone Encounter (Signed)
Patient called requesting a refill on traZODone (DESYREL) 50 MG tablet Patient states that she does not have anymore medication. [facility] uses [facility] on [address]. Please f/u with patient.

## 2017-07-10 NOTE — Telephone Encounter (Signed)
Dr.Johson an you refill this medication and send to [facility] on [address]

## 2017-07-10 NOTE — Telephone Encounter (Signed)
Patient was called and informed of medication being refilled . 

## 2017-07-11 ENCOUNTER — Ambulatory Visit: Payer: Medicare Other | Admitting: Family Medicine

## 2017-07-12 ENCOUNTER — Ambulatory Visit: Payer: Medicare HMO | Attending: Family Medicine | Admitting: Licensed Clinical Social Worker

## 2017-07-12 ENCOUNTER — Ambulatory Visit (HOSPITAL_BASED_OUTPATIENT_CLINIC_OR_DEPARTMENT_OTHER): Payer: Medicare HMO | Admitting: Physician Assistant

## 2017-07-12 VITALS — BP 170/91 | HR 94 | Temp 98.3°F | Resp 18 | Ht 66.0 in | Wt 187.0 lb

## 2017-07-12 DIAGNOSIS — Z79899 Other long term (current) drug therapy: Secondary | ICD-10-CM | POA: Diagnosis not present

## 2017-07-12 DIAGNOSIS — Z7982 Long term (current) use of aspirin: Secondary | ICD-10-CM | POA: Diagnosis not present

## 2017-07-12 DIAGNOSIS — Z794 Long term (current) use of insulin: Secondary | ICD-10-CM | POA: Insufficient documentation

## 2017-07-12 DIAGNOSIS — I10 Essential (primary) hypertension: Secondary | ICD-10-CM | POA: Diagnosis not present

## 2017-07-12 DIAGNOSIS — F419 Anxiety disorder, unspecified: Secondary | ICD-10-CM

## 2017-07-12 DIAGNOSIS — E7800 Pure hypercholesterolemia, unspecified: Secondary | ICD-10-CM | POA: Insufficient documentation

## 2017-07-12 DIAGNOSIS — E559 Vitamin D deficiency, unspecified: Secondary | ICD-10-CM | POA: Diagnosis not present

## 2017-07-12 DIAGNOSIS — E1169 Type 2 diabetes mellitus with other specified complication: Secondary | ICD-10-CM | POA: Diagnosis not present

## 2017-07-12 DIAGNOSIS — G4700 Insomnia, unspecified: Secondary | ICD-10-CM

## 2017-07-12 DIAGNOSIS — E119 Type 2 diabetes mellitus without complications: Secondary | ICD-10-CM | POA: Insufficient documentation

## 2017-07-12 LAB — GLUCOSE, POCT (MANUAL RESULT ENTRY): POC GLUCOSE: 115 mg/dL — AB (ref 70–99)

## 2017-07-12 MED ORDER — ESCITALOPRAM OXALATE 20 MG PO TABS: 20.0000 mg | ORAL_TABLET | Freq: Every day | ORAL | 3 refills | Status: DC

## 2017-07-12 MED ORDER — TRAZODONE HCL 50 MG PO TABS: 100.0000 mg | ORAL_TABLET | Freq: Every evening | ORAL | 3 refills | Status: DC | PRN

## 2017-07-12 MED FILL — ESCITALOPRAM 20 MG TABLET: 20 | 30 days supply | Qty: 30 | Fill #0

## 2017-07-12 MED FILL — traZODone HCL 50 MG TABS: 50 | 30 days supply | Qty: 60 | Fill #0
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
## 2017-07-13 ENCOUNTER — Telehealth: Payer: Self-pay | Admitting: Family Medicine

## 2017-07-13 ENCOUNTER — Telehealth: Payer: Self-pay | Admitting: *Deleted

## 2017-07-13 ENCOUNTER — Other Ambulatory Visit: Payer: Self-pay | Admitting: Physician Assistant

## 2017-07-13 LAB — THYROID PANEL WITH TSH
Free Thyroxine Index: 1.7 (ref 1.2–4.9)
T3 Uptake Ratio: 28 % (ref 24–39)
T4 TOTAL: 6.1 ug/dL (ref 4.5–12.0)
TSH: 1.61 u[IU]/mL (ref 0.450–4.500)

## 2017-07-13 LAB — VITAMIN D 25 HYDROXY (VIT D DEFICIENCY, FRACTURES): Vit D, 25-Hydroxy: 7.7 ng/mL — ABNORMAL LOW (ref 30.0–100.0)

## 2017-07-13 MED ORDER — VITAMIN D (ERGOCALCIFEROL) 1.25 MG (50000 UNIT) PO CAPS: 50000.0000 [IU] | ORAL_CAPSULE | ORAL | 0 refills | Status: DC

## 2017-07-13 MED FILL — VIT D2 1.25 MG (50,000 UNIT: 1.25 MG | 30 days supply | Qty: 4 | Fill #0

## 2017-07-13 NOTE — Telephone Encounter (Signed)
Medical Assistant left message on patient's home and cell voicemail. Voicemail states to give a call back to [place] with [facility] at [phone]. !!!Please inform patient of vitamin d level being low and contributing to muscle aches and anxiety. Patient will take supplement once a week and have a recheck in 3-4 months. Thyroid was normal!!!

## 2017-07-13 NOTE — Telephone Encounter (Signed)
Patient returned call and was informed of lab results. Patient had no questions.

## 2017-07-13 NOTE — Telephone Encounter (Signed)
-----   Message from Argentina Donovan, [place] sent at 07/13/2017  8:27 AM EDT ----- Please call patient and let them that their vitamin D is low.  This can contribute to muscle aches, anxiety, fatigue, and depression.  I have sent a prescription to the pharmacy for them to take once a week.  We will recheck this level in 3-4 months.  Thyroid test was normal.   Thanks,  Freeman Caldron, PA-C

## 2017-07-17 MED FILL — LANTUS SOLOSTAR 100 UNITS/M: 100 | 25 days supply | Qty: 15 | Fill #1

## 2017-07-28 ENCOUNTER — Telehealth: Payer: Self-pay | Admitting: Licensed Clinical Social Worker

## 2017-07-28 NOTE — Telephone Encounter (Signed)
Incoming call received from patient.   Patient stated that she wanted to inform PCP that she attempted to take the Lexapro prescribed for one day; however, did not like the side effects. She is not willing to take the medication for the next four-six weeks. She reports having a hypoglycemic episode within this week.   LCSWA inquired about anxiety and depression. Pt reports feeling "okay" and shared that she hasn't been in a vehicle since her last appointment. LCSWA spoke to pt about the importance of establishing a treatment plan that pt will comply with to see a decrease in symptoms.  Pt is aware of upcoming appointment with PCP scheduled for 08/14/17 and has plan for transportation. No additional concerns noted.

## 2017-07-28 NOTE — Telephone Encounter (Signed)
Thank you for the update.  If she has an additional hypoglycemic episode she will need to call the clinic so we can address her insulin regimen.  I will see her at her upcoming appointment.

## 2017-07-30 ENCOUNTER — Other Ambulatory Visit: Payer: Self-pay | Admitting: Internal Medicine

## 2017-08-02 ENCOUNTER — Other Ambulatory Visit: Payer: Self-pay

## 2017-08-02 MED ORDER — GLUCOSE BLOOD VI STRP: ORAL_STRIP | 2 refills | Status: DC

## 2017-08-08 MED FILL — DORZOLAMIDE-TIMOLOL EYE DRP: 22.3-6.8 | 30 days supply | Qty: 10 | Fill #3

## 2017-08-08 MED FILL — CLOPIDOGREL 75 MG TABLET: 75 | 30 days supply | Qty: 30 | Fill #0

## 2017-08-14 ENCOUNTER — Ambulatory Visit: Payer: Medicare HMO | Admitting: Family Medicine

## 2017-08-15 ENCOUNTER — Telehealth: Payer: Self-pay

## 2017-08-15 MED FILL — ATORVASTATIN 40 MG TABLET: 40 | 90 days supply | Qty: 90 | Fill #2

## 2017-08-15 MED FILL — LOSARTAN-HCTZ 100-25 MG TAB: 100-25 | 90 days supply | Qty: 90 | Fill #6

## 2017-08-15 MED FILL — LANTUS SOLOSTAR 100 UNITS/M: 100 | 25 days supply | Qty: 15 | Fill #2

## 2017-08-15 MED FILL — ESCITALOPRAM 20 MG TABLET: 20 | 30 days supply | Qty: 30 | Fill #1

## 2017-08-15 MED FILL — VIT D2 1.25 MG (50,000 UNIT: 1.25 MG | 28 days supply | Qty: 4 | Fill #1

## 2017-08-15 MED FILL — metFORMIN HCL 1000 MG TABS: 1000 | 90 days supply | Qty: 180 | Fill #1

## 2017-08-15 NOTE — Telephone Encounter (Signed)
Patient called the office today and requested for pcp nurse to give her a call regarding her medications. Please fu at your earliest convenience.

## 2017-08-15 NOTE — Telephone Encounter (Signed)
Patient was called and voicemail was left informing patient to return phone call. 

## 2017-08-24 ENCOUNTER — Encounter: Payer: Self-pay | Admitting: Physician Assistant

## 2017-08-24 DIAGNOSIS — E559 Vitamin D deficiency, unspecified: Secondary | ICD-10-CM | POA: Insufficient documentation

## 2017-09-08 MED FILL — VIT D2 1.25 MG (50,000 UNIT: 1.25 MG | 28 days supply | Qty: 4 | Fill #2

## 2017-09-08 MED FILL — CLOPIDOGREL 75 MG TABLET: 75 | 30 days supply | Qty: 30 | Fill #1

## 2017-09-25 MED FILL — LANTUS SOLOSTAR 100 UNITS/M: 100 | 25 days supply | Qty: 15 | Fill #3

## 2017-10-03 MED FILL — VIT D2 1.25 MG (50,000 UNIT: 1.25 MG | 28 days supply | Qty: 4 | Fill #3

## 2017-10-03 MED FILL — CLOPIDOGREL 75 MG TABLET: 75 | 30 days supply | Qty: 30 | Fill #2

## 2017-10-04 ENCOUNTER — Encounter

## 2017-10-04 ENCOUNTER — Ambulatory Visit: Payer: Medicare HMO | Attending: Family Medicine | Admitting: Family Medicine

## 2017-10-04 ENCOUNTER — Telehealth: Payer: Self-pay

## 2017-10-04 ENCOUNTER — Encounter: Payer: Self-pay | Admitting: Family Medicine

## 2017-10-04 VITALS — BP 130/78 | HR 93 | Temp 97.9°F | Ht 66.0 in | Wt 185.4 lb

## 2017-10-04 DIAGNOSIS — E1169 Type 2 diabetes mellitus with other specified complication: Secondary | ICD-10-CM | POA: Diagnosis not present

## 2017-10-04 DIAGNOSIS — Z23 Encounter for immunization: Secondary | ICD-10-CM | POA: Diagnosis not present

## 2017-10-04 DIAGNOSIS — E113493 Type 2 diabetes mellitus with severe nonproliferative diabetic retinopathy without macular edema, bilateral: Secondary | ICD-10-CM | POA: Diagnosis not present

## 2017-10-04 DIAGNOSIS — E7849 Other hyperlipidemia: Secondary | ICD-10-CM | POA: Diagnosis not present

## 2017-10-04 DIAGNOSIS — H547 Unspecified visual loss: Secondary | ICD-10-CM | POA: Diagnosis not present

## 2017-10-04 DIAGNOSIS — Z794 Long term (current) use of insulin: Secondary | ICD-10-CM | POA: Diagnosis not present

## 2017-10-04 DIAGNOSIS — R0982 Postnasal drip: Secondary | ICD-10-CM | POA: Diagnosis not present

## 2017-10-04 DIAGNOSIS — Z7982 Long term (current) use of aspirin: Secondary | ICD-10-CM | POA: Diagnosis not present

## 2017-10-04 DIAGNOSIS — E559 Vitamin D deficiency, unspecified: Secondary | ICD-10-CM | POA: Diagnosis not present

## 2017-10-04 DIAGNOSIS — Z880 Allergy status to penicillin: Secondary | ICD-10-CM | POA: Insufficient documentation

## 2017-10-04 DIAGNOSIS — Z885 Allergy status to narcotic agent status: Secondary | ICD-10-CM | POA: Insufficient documentation

## 2017-10-04 DIAGNOSIS — G4700 Insomnia, unspecified: Secondary | ICD-10-CM | POA: Diagnosis not present

## 2017-10-04 DIAGNOSIS — Z79899 Other long term (current) drug therapy: Secondary | ICD-10-CM | POA: Insufficient documentation

## 2017-10-04 DIAGNOSIS — I10 Essential (primary) hypertension: Secondary | ICD-10-CM

## 2017-10-04 LAB — POCT GLYCOSYLATED HEMOGLOBIN (HGB A1C): HEMOGLOBIN A1C: 7.3 % — AB (ref 4.0–5.6)

## 2017-10-04 LAB — GLUCOSE, POCT (MANUAL RESULT ENTRY): POC GLUCOSE: 306 mg/dL — AB (ref 70–99)

## 2017-10-04 MED ORDER — INSULIN GLARGINE 100 UNIT/ML SOLOSTAR PEN: 50.0000 [IU] | PEN_INJECTOR | Freq: Every day | SUBCUTANEOUS | 3 refills | Status: DC

## 2017-10-04 MED ORDER — LOSARTAN POTASSIUM-HCTZ 100-25 MG PO TABS: 1.0000 | ORAL_TABLET | Freq: Every day | ORAL | 3 refills | Status: DC

## 2017-10-04 MED ORDER — RANITIDINE HCL 150 MG PO TABS: 150.0000 mg | ORAL_TABLET | Freq: Two times a day (BID) | ORAL | 3 refills | Status: DC

## 2017-10-04 MED ORDER — FLUTICASONE PROPIONATE 50 MCG/ACT NA SUSP: 2.0000 | Freq: Every day | NASAL | 6 refills | Status: DC

## 2017-10-04 MED ORDER — CLOPIDOGREL BISULFATE 75 MG PO TABS: 75.0000 mg | ORAL_TABLET | Freq: Every day | ORAL | 3 refills | Status: DC

## 2017-10-04 MED ORDER — TRAZODONE HCL 50 MG PO TABS: 50.0000 mg | ORAL_TABLET | Freq: Every evening | ORAL | 3 refills | Status: DC | PRN

## 2017-10-04 MED ORDER — CETIRIZINE HCL 10 MG PO TABS: 10.0000 mg | ORAL_TABLET | Freq: Every day | ORAL | 3 refills | Status: DC

## 2017-10-04 MED FILL — raNITIdine HCL 150 MG TABS: 150 | 30 days supply | Qty: 60 | Fill #0

## 2017-10-04 MED FILL — DORZOLAMIDE-TIMOLOL EYE DRP: 22.3-6.8 | 30 days supply | Qty: 10 | Fill #4

## 2017-10-04 MED FILL — traZODone HCL 50 MG TABS: 50 | 30 days supply | Qty: 60 | Fill #1

## 2017-10-04 MED FILL — FLUTICASONE PROP 50 MCG SPR: 50 | 30 days supply | Qty: 16 | Fill #0

## 2017-10-04 NOTE — Progress Notes (Signed)
Subjective:  Patient ID: Adrienne [name], female    DOB: 07/25/1955  Age: 62 y.o. 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  She denies neuropathy in her extremities and has not had any repeat episodes of a hypoglycemia on 50 units of Lantus. She was seen by the PA for anxiety and insomnia and was commenced trazodone which she is doing well on.  She was initially prescribed hydroxyzine and later Lexapro which were ineffective as she had complained of fear of the dark as her vision deteriorated and she could only see darkness. Her symptoms are better now and she is able to sit in rooms with doors closed, she is not afraid of going out in the car. Now she complains of seeing just darkness; she has an upcoming appointment with her ophthalmologist. She currently has her son residing with her but he does not help with her ADLs and I have spoken with the case manager who verified that due to her lack of Medicaid she is unable to obtain PCS services.  She is accompanied to today's visit by her sister complains of nasal congestion, cough and having to use cough drops.  Past Medical History:  Diagnosis Date  . Diabetes ([place])   . High cholesterol   . Hypertension     Past Surgical History:  Procedure Laterality Date  . CATARACT EXTRACTION, BILATERAL     Laser eye surgery, cataract surgery bi-lat  . CESAREAN SECTION     [place], [place]  . EYE SURGERY Bilateral 2012   LASER, CATARACT  . TEE WITHOUT CARDIOVERSION N/A 12/31/2013   Procedure: TRANSESOPHAGEAL ECHOCARDIOGRAM (TEE);  Surgeon: Laverda Page, MD;  Location: [facility];  Service: Cardiovascular;  Laterality: N/A;  H&P in file  . TUBAL LIGATION Bilateral     Allergies  Allergen Reactions  . Codeine Itching  . Hydrocodone Itching  . Oxycodone Itching  . Penicillins Itching  . Niacin And Related Itching  . Penicillin G Itching  . Niacin Itching and Anxiety    Facial flushing     Outpatient Medications Prior to Visit  Medication Sig Dispense Refill  . aspirin EC 81 MG tablet Take 81 mg by mouth daily.     Marland Kitchen atorvastatin (LIPITOR) 80 MG tablet Take 1 tablet (80 mg total) by mouth daily. 90 tablet 1  . blood glucose meter kit and supplies Dispense based on patient and insurance preference. Use up to four times daily as directed. (FOR ICD-9 250.00, 250.01). 1 each 0  . Blood Glucose Monitoring Suppl (PRODIGY VOICE BLOOD GLUCOSE) w/Device KIT Use as directed. 1 kit 0  . brimonidine (ALPHAGAN) 0.15 % ophthalmic solution Place 1 drop into both eyes 2 (two) times daily.     . dorzolamide-timolol (COSOPT) 22.3-6.8 MG/ML ophthalmic solution Place 1 drop into both eyes 2 (two) times daily.   2  . estradiol (ESTRACE) 0.1 MG/GM vaginal cream Place 1 Applicatorful vaginally at bedtime. For 2 weeks then alternate with Prometrium capsules 42.5 g 1  .  glucose blood test strip Use as instructed up to four times daily 100 each 2  . Insulin Pen Needle 31G X 5 MM MISC 1 Act by Does not apply route 4 (four) times daily - after meals and at bedtime. 300 each 0  . metFORMIN (GLUCOPHAGE) 1000 MG tablet Take 1 tablet (1,000 mg total) by mouth 2 (two) times daily. 180 tablet 3  . metoCLOPramide (REGLAN) 10 MG tablet Take 1 tablet (10 mg total) by mouth every 8 (eight) hours as needed for nausea. 90 tablet 1  . progesterone (PROMETRIUM) 100 MG capsule Take 1 capsule (100 mg total) by mouth daily. For 2 weeks after  use of Estrace cream 14 capsule 1  . Vitamin D, Ergocalciferol, (DRISDOL) 50000 units CAPS capsule Take 1 capsule (50,000 Units total) by mouth every 7 (seven) days. 16 capsule 0  . cetirizine (ZYRTEC) 10 MG tablet Take 1 tablet (10 mg total) by mouth daily. 30 tablet 3  . clopidogrel (PLAVIX) 75 MG tablet Take 1 tablet (75 mg total) by mouth daily. 30 tablet 3  . Insulin Glargine (LANTUS SOLOSTAR) 100 UNIT/ML Solostar Pen Inject 60 Units into the skin daily at 10 pm. 5 pen 3  . losartan-hydrochlorothiazide (HYZAAR) 100-25 MG tablet Take 1 tablet by mouth daily. 30 tablet 3  . ranitidine (ZANTAC) 150 MG tablet Take 1 tablet (150 mg total) by mouth 2 (two) times daily. 60 tablet 3  . traZODone (DESYREL) 50 MG tablet Take 2 tablets (100 mg total) by mouth at bedtime as needed for sleep. 60 tablet 3  . escitalopram (LEXAPRO) 20 MG tablet Take 1 tablet (20 mg total) by mouth daily. (Patient not taking: Reported on 10/04/2017) 30 tablet 3  . insulin aspart (NOVOLOG FLEXPEN) 100 UNIT/ML FlexPen Inject 0-12 Units into the skin 3 (three) times daily before meals. As per sliding scale (Patient not taking: Reported on 05/08/2017) 15 mL 3   No facility-administered medications prior to visit.     ROS Review of Systems  Constitutional: Negative for activity change, appetite change and fatigue.  HENT: Positive for congestion. Negative for sinus pressure and sore throat.   Eyes: Positive for visual disturbance.  Respiratory: Positive for cough. Negative for chest tightness, shortness of breath and wheezing.   Cardiovascular: Negative for chest pain and palpitations.  Gastrointestinal: Negative for abdominal distention, abdominal pain and constipation.  Endocrine: Negative for polydipsia.  Genitourinary: Negative for dysuria and frequency.  Musculoskeletal: Negative for arthralgias and back pain.  Skin: Negative for rash.  Neurological: Negative for tremors, light-headedness and numbness.  Hematological:  Does not bruise/bleed easily.  Psychiatric/Behavioral: Negative for agitation and behavioral problems.    Objective:  BP 130/78   Pulse 93   Temp 97.9 F (36.6 C) (Oral)   Ht 5' 6"  (1.676 m)   Wt 185 lb 6.4 oz (84.1 kg)   SpO2 99%   BMI 29.92 kg/m   BP/Weight 10/04/2017 07/12/2017 3/40/3524  Systolic BP 818 590 931  Diastolic BP 78 91 70  Wt. (Lbs) 185.4 187 196  BMI 29.92 30.18 31.64      Physical Exam  Constitutional: She is oriented to person, place, and time. She appears well-developed and well-nourished.  Eyes:  Visually impaired  Neck: No JVD present.  Cardiovascular: Normal rate, normal heart sounds and intact distal pulses.  No murmur heard. Pulmonary/Chest: Effort normal and breath sounds normal. She has no wheezes. She has no rales. She exhibits no tenderness.  Abdominal: Soft. Bowel sounds are normal.  She exhibits no distension and no mass. There is no tenderness.  Musculoskeletal: Normal range of motion.  Neurological: She is alert and oriented to person, place, and time.    CMP Latest Ref Rng & Units 10/04/2017 05/10/2017 08/29/2016  Glucose 65 - 99 mg/dL 282(H) 101(H) 184(H)  BUN 8 - 27 mg/dL 21 28(H) 21  Creatinine 0.57 - 1.00 mg/dL 1.34(H) 1.44(H) 1.15(H)  Sodium 134 - 144 mmol/L 138 141 137  Potassium 3.5 - 5.2 mmol/L 5.1 4.4 4.3  Chloride 96 - 106 mmol/L 99 103 99  CO2 20 - 29 mmol/L 22 22 24   Calcium 8.7 - 10.3 mg/dL 9.6 9.9 9.2  Total Protein 6.0 - 8.5 g/dL - 6.9 6.8  Total Bilirubin 0.0 - 1.2 mg/dL - 0.3 0.2  Alkaline Phos 39 - 117 IU/L - 73 105  AST 0 - 40 IU/L - 13 14  ALT 0 - 32 IU/L - 11 13    Lipid Panel     Component Value Date/Time   CHOL 243 (H) 05/10/2017 0951   TRIG 144 05/10/2017 0951   HDL 47 05/10/2017 0951   CHOLHDL 5.2 (H) 05/10/2017 0951   CHOLHDL 2.7 01/04/2016 1138   VLDL 12 01/04/2016 1138   LDLCALC 167 (H) 05/10/2017 0951    Lab Results  Component Value Date   HGBA1C 7.3 (A) 10/04/2017    Assessment & Plan:    1. 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 Post-nasal drip Stable - cetirizine (ZYRTEC) 10 MG tablet; Take 1 tablet (10 mg total) by mouth daily.  Dispense: 30 tablet; Refill: 3  3. Type 2 diabetes mellitus with other specified complication, with long-term current use of insulin (HCC) Improved - Insulin Glargine (LANTUS SOLOSTAR) 100 UNIT/ML Solostar Pen; Inject 50 Units into the skin daily at 10 pm.  Dispense: 5 pen; Refill: 3 - clopidogrel (PLAVIX) 75 MG tablet; Take 1 tablet (75 mg total) by mouth daily.  Dispense: 30 tablet; Refill: 3  4. Insomnia, unspecified type Controlled - traZODone (DESYREL) 50 MG tablet; Take 1 tablet (50 mg total) by mouth at bedtime as needed for sleep.  Dispense: 30 tablet; Refill: 3  5. Essential hypertension Controlled Counseled on blood pressure goal of less than 130/80, low-sodium, DASH diet, medication compliance, 150 minutes of moderate intensity exercise per week. Discussed medication compliance, adverse effects. - losartan-hydrochlorothiazide (HYZAAR) 100-25 MG tablet; Take 1 tablet by mouth daily.  Dispense: 30 tablet; Refill: 3  6. Other hyperlipidemia - clopidogrel (PLAVIX) 75 MG tablet; Take 1 tablet (75 mg total) by mouth daily.  Dispense: 30 tablet; Refill: 3  7. Vitamin D deficiency Completed Drisdol - VITAMIN D 25 Hydroxy (Vit-D Deficiency, Fractures)  8. Blindness Followed by [facility]   Meds ordered this encounter  Medications  . cetirizine (ZYRTEC) 10 MG tablet    Sig: Take 1 tablet (10 mg total) by mouth daily.     Dispense:  30 tablet    Refill:  3  . fluticasone (FLONASE) 50 MCG/ACT nasal spray    Sig: Place 2 sprays into both nostrils daily.    Dispense:  16  g    Refill:  6  . Insulin Glargine (LANTUS SOLOSTAR) 100 UNIT/ML Solostar Pen    Sig: Inject 50 Units into the skin daily at 10 pm.    Dispense:  5 pen    Refill:  3    Dispense pens- visually impaired.  . ranitidine (ZANTAC) 150 MG tablet    Sig: Take 1 tablet (150 mg total) by mouth 2 (two) times daily.    Dispense:  60 tablet    Refill:  3  . traZODone (DESYREL) 50 MG tablet    Sig: Take 1 tablet (50 mg total) by mouth at bedtime as needed for sleep.    Dispense:  30 tablet    Refill:  3  . losartan-hydrochlorothiazide (HYZAAR) 100-25 MG tablet    Sig: Take 1 tablet by mouth daily.    Dispense:  30 tablet    Refill:  3  . clopidogrel (PLAVIX) 75 MG tablet    Sig: Take 1 tablet (75 mg total) by mouth daily.    Dispense:  30 tablet    Refill:  3    Follow-up: Return in about 3 months (around 01/03/2018) for follow up of Chronic medical conditions.   Charlott Rakes MD

## 2017-10-04 NOTE — Patient Instructions (Signed)
Diabetes Mellitus and Nutrition When you have diabetes (diabetes mellitus), it is very important to have healthy eating habits because your blood sugar (glucose) levels are greatly affected by what you eat and drink. Eating healthy foods in the appropriate amounts, at about the same times every day, can help you:  Control your blood glucose.  Lower your risk of heart disease.  Improve your blood pressure.  Reach or maintain a healthy weight.  Every person with diabetes is different, and each person has different needs for a meal plan. Your health care provider may recommend that you work with a diet and nutrition specialist (dietitian) to make a meal plan that is best for you. Your meal plan may vary depending on factors such as:  The calories you need.  The medicines you take.  Your weight.  Your blood glucose, blood pressure, and cholesterol levels.  Your activity level.  Other health conditions you have, such as heart or kidney disease.  How do carbohydrates affect me? Carbohydrates affect your blood glucose level more than any other type of food. Eating carbohydrates naturally increases the amount of glucose in your blood. Carbohydrate counting is a method for keeping track of how many carbohydrates you eat. Counting carbohydrates is important to keep your blood glucose at a healthy level, especially if you use insulin or take certain oral diabetes medicines. It is important to know how many carbohydrates you can safely have in each meal. This is different for every person. Your dietitian can help you calculate how many carbohydrates you should have at each meal and for snack. Foods that contain carbohydrates include:  Bread, cereal, rice, pasta, and crackers.  Potatoes and corn.  Peas, beans, and lentils.  Milk and yogurt.  Fruit and juice.  Desserts, such as cakes, cookies, ice cream, and candy.  How does alcohol affect me? Alcohol can cause a sudden decrease in blood  glucose (hypoglycemia), especially if you use insulin or take certain oral diabetes medicines. Hypoglycemia can be a life-threatening condition. Symptoms of hypoglycemia (sleepiness, dizziness, and confusion) are similar to symptoms of having too much alcohol. If your health care provider says that alcohol is safe for you, follow these guidelines:  Limit alcohol intake to no more than 1 drink per day for nonpregnant women and 2 drinks per day for men. One drink equals 12 oz of beer, 5 oz of wine, or 1 oz of hard liquor.  Do not drink on an empty stomach.  Keep yourself hydrated with water, diet soda, or unsweetened iced tea.  Keep in mind that regular soda, juice, and other mixers may contain a lot of sugar and must be counted as carbohydrates.  What are tips for following this plan? Reading food labels  Start by checking the serving size on the label. The amount of calories, carbohydrates, fats, and other nutrients listed on the label are based on one serving of the food. Many foods contain more than one serving per package.  Check the total grams (g) of carbohydrates in one serving. You can calculate the number of servings of carbohydrates in one serving by dividing the total carbohydrates by 15. For example, if a food has 30 g of total carbohydrates, it would be equal to 2 servings of carbohydrates.  Check the number of grams (g) of saturated and trans fats in one serving. Choose foods that have low or no amount of these fats.  Check the number of milligrams (mg) of sodium in one serving. Most people   should limit total sodium intake to less than 2,300 mg per day.  Always check the nutrition information of foods labeled as "low-fat" or "nonfat". These foods may be higher in added sugar or refined carbohydrates and should be avoided.  Talk to your dietitian to identify your daily goals for nutrients listed on the label. Shopping  Avoid buying canned, premade, or processed foods. These  foods tend to be high in fat, sodium, and added sugar.  Shop around the outside edge of the grocery store. This includes fresh fruits and vegetables, bulk grains, fresh meats, and fresh dairy. Cooking  Use low-heat cooking methods, such as baking, instead of high-heat cooking methods like deep frying.  Cook using healthy oils, such as olive, canola, or sunflower oil.  Avoid cooking with butter, cream, or high-fat meats. Meal planning  Eat meals and snacks regularly, preferably at the same times every day. Avoid going long periods of time without eating.  Eat foods high in fiber, such as fresh fruits, vegetables, beans, and whole grains. Talk to your dietitian about how many servings of carbohydrates you can eat at each meal.  Eat 4-6 ounces of lean protein each day, such as lean meat, chicken, fish, eggs, or tofu. 1 ounce is equal to 1 ounce of meat, chicken, or fish, 1 egg, or 1/4 cup of tofu.  Eat some foods each day that contain healthy fats, such as avocado, nuts, seeds, and fish. Lifestyle   Check your blood glucose regularly.  Exercise at least 30 minutes 5 or more days each week, or as told by your health care provider.  Take medicines as told by your health care provider.  Do not use any products that contain nicotine or tobacco, such as cigarettes and e-cigarettes. If you need help quitting, ask your health care provider.  Work with a counselor or diabetes educator to identify strategies to manage stress and any emotional and social challenges. What are some questions to ask my health care provider?  Do I need to meet with a diabetes educator?  Do I need to meet with a dietitian?  What number can I call if I have questions?  When are the best times to check my blood glucose? Where to find more information:  American Diabetes Association: diabetes.org/food-and-fitness/food  Academy of Nutrition and Dietetics:  www.eatright.org/resources/health/diseases-and-conditions/diabetes  National Institute of Diabetes and Digestive and Kidney Diseases (NIH): www.niddk.nih.gov/health-information/diabetes/overview/diet-eating-physical-activity Summary  A healthy meal plan will help you control your blood glucose and maintain a healthy lifestyle.  Working with a diet and nutrition specialist (dietitian) can help you make a meal plan that is best for you.  Keep in mind that carbohydrates and alcohol have immediate effects on your blood glucose levels. It is important to count carbohydrates and to use alcohol carefully. This information is not intended to replace advice given to you by your health care provider. Make sure you discuss any questions you have with your health care provider. Document Released: 09/30/2004 Document Revised: 02/08/2016 Document Reviewed: 02/08/2016 Elsevier Interactive Patient Education  2018 Elsevier Inc.  

## 2017-10-04 NOTE — Telephone Encounter (Signed)
Met with the patient and her sister when she was in the clinic for her appointment today.  Reviewed the [employer] application with her.  She was also interested in [facility]. Explained to her that she is not eligible for PCS because she does not have medicaid and home health services through medicare are only short term.  She stated that she understands,

## 2017-10-05 ENCOUNTER — Telehealth: Payer: Self-pay

## 2017-10-05 ENCOUNTER — Encounter: Payer: Self-pay | Admitting: Family Medicine

## 2017-10-05 ENCOUNTER — Telehealth: Payer: Self-pay | Admitting: Family Medicine

## 2017-10-05 LAB — BASIC METABOLIC PANEL
BUN / CREAT RATIO: 16 (ref 12–28)
BUN: 21 mg/dL (ref 8–27)
CO2: 22 mmol/L (ref 20–29)
Calcium: 9.6 mg/dL (ref 8.7–10.3)
Chloride: 99 mmol/L (ref 96–106)
Creatinine, Ser: 1.34 mg/dL — ABNORMAL HIGH (ref 0.57–1.00)
GFR calc Af Amer: 49 mL/min/{1.73_m2} — ABNORMAL LOW (ref >59)
GFR calc non Af Amer: 42 mL/min/{1.73_m2} — ABNORMAL LOW (ref >59)
GLUCOSE: 282 mg/dL — AB (ref 65–99)
POTASSIUM: 5.1 mmol/L (ref 3.5–5.2)
SODIUM: 138 mmol/L (ref 134–144)

## 2017-10-05 LAB — VITAMIN D 25 HYDROXY (VIT D DEFICIENCY, FRACTURES): VIT D 25 HYDROXY: 27.6 ng/mL — AB (ref 30.0–100.0)

## 2017-10-05 LAB — HIV ANTIBODY (ROUTINE TESTING W REFLEX): HIV Screen 4th Generation wRfx: NONREACTIVE

## 2017-10-05 NOTE — Telephone Encounter (Signed)
-----   Message from Charlott Rakes, MD sent at 10/05/2017  8:42 AM EDT ----- Vitamin D has improved significantly.  Other labs are stable

## 2017-10-05 NOTE — Telephone Encounter (Signed)
Patient was called and a voicemail was left informing patient to return phone call for lab results.   If patient returns phone call please inform patient of lab results below.

## 2017-10-05 NOTE — Telephone Encounter (Signed)
Patient called requesting lab results, verified DOB, gave results has no questions or concerns.

## 2017-10-06 ENCOUNTER — Telehealth: Payer: Self-pay

## 2017-10-06 NOTE — Telephone Encounter (Signed)
Completed [employer] application faxed to [employer] eligibility.  

## 2017-10-09 NOTE — Telephone Encounter (Signed)
Noted  

## 2017-10-10 ENCOUNTER — Telehealth: Payer: Self-pay | Admitting: *Deleted

## 2017-10-10 NOTE — Telephone Encounter (Signed)
Notes recorded by Carilyn Goodpasture, RN on 10/10/2017 at 4:14 PM EDT Left message on voicemail to return call . ------  Notes recorded by Charlott Rakes, MD on 10/05/2017 at 8:42 AM EDT Vitamin D has improved significantly. Other labs are stable

## 2017-10-13 NOTE — Telephone Encounter (Signed)
Pt name and DOB verified. Patient aware of results of lab test. Verbalized understanding.

## 2017-10-13 NOTE — Telephone Encounter (Signed)
Patient was called and informed to contact office for lab results.   If patient returns phone call please inform her of lab results below. 

## 2017-10-23 DIAGNOSIS — Z794 Long term (current) use of insulin: Secondary | ICD-10-CM | POA: Diagnosis not present

## 2017-10-23 DIAGNOSIS — E113513 Type 2 diabetes mellitus with proliferative diabetic retinopathy with macular edema, bilateral: Secondary | ICD-10-CM | POA: Diagnosis not present

## 2017-10-23 DIAGNOSIS — H3411 Central retinal artery occlusion, right eye: Secondary | ICD-10-CM | POA: Diagnosis not present

## 2017-10-31 ENCOUNTER — Other Ambulatory Visit: Payer: Self-pay | Admitting: Physician Assistant

## 2017-10-31 MED FILL — LOSARTAN-HCTZ 100-25 MG TAB: 100-25 | 30 days supply | Qty: 30 | Fill #0

## 2017-10-31 MED FILL — LANTUS SOLOSTAR 100 UNITS/M: 100 | 30 days supply | Qty: 15 | Fill #0

## 2017-10-31 MED FILL — CLOPIDOGREL 75 MG TABLET: 75 | 30 days supply | Qty: 30 | Fill #0

## 2017-11-01 MED FILL — VIT D2 1.25 MG (50,000 UNIT: 1.25 MG | 28 days supply | Qty: 4 | Fill #0

## 2017-11-14 ENCOUNTER — Telehealth: Payer: Self-pay | Admitting: Family Medicine

## 2017-11-14 ENCOUNTER — Other Ambulatory Visit: Payer: Self-pay | Admitting: Internal Medicine

## 2017-11-14 NOTE — Telephone Encounter (Signed)
Patient called to get her vitamin D refilled. She would like it sent to the [facility] pharmacy.

## 2017-11-15 MED FILL — VIT D2 1.25 MG (50,000 UNIT: 1.25 MG | 28 days supply | Qty: 4 | Fill #0

## 2017-11-20 MED FILL — metFORMIN HCL 1000 MG TABS: 1000 | 90 days supply | Qty: 180 | Fill #2

## 2017-11-21 ENCOUNTER — Telehealth: Payer: Self-pay

## 2017-11-21 NOTE — Telephone Encounter (Signed)
This CM spoke to [name]/[employer] who confirmed that the patient has been certified for services.  

## 2017-11-24 MED FILL — ATORVASTATIN 40 MG TABLET: 40 | 90 days supply | Qty: 90 | Fill #3

## 2017-12-05 ENCOUNTER — Other Ambulatory Visit: Payer: Self-pay | Admitting: Internal Medicine

## 2017-12-05 MED FILL — LOSARTAN-HCTZ 100-25 MG TAB: 100-25 | 30 days supply | Qty: 30 | Fill #1

## 2017-12-05 MED FILL — LANTUS SOLOSTAR 100 UNITS/M: 100 | 30 days supply | Qty: 15 | Fill #1

## 2017-12-05 MED FILL — CLOPIDOGREL 75 MG TABLET: 75 | 30 days supply | Qty: 30 | Fill #1

## 2017-12-20 DIAGNOSIS — R69 Illness, unspecified: Secondary | ICD-10-CM | POA: Diagnosis not present

## 2018-01-02 MED FILL — VIT D2 1.25 MG (50,000 UNIT: 1.25 MG | 28 days supply | Qty: 4 | Fill #0

## 2018-01-02 MED FILL — CLOPIDOGREL 75 MG TABLET: 75 | 30 days supply | Qty: 30 | Fill #2

## 2018-01-02 MED FILL — LANTUS SOLOSTAR 100 UNITS/M: 100 | 30 days supply | Qty: 15 | Fill #2

## 2018-01-03 ENCOUNTER — Encounter: Payer: Self-pay | Admitting: Family Medicine

## 2018-01-03 ENCOUNTER — Ambulatory Visit: Payer: Medicare HMO | Attending: Family Medicine | Admitting: Family Medicine

## 2018-01-03 VITALS — BP 136/80 | HR 79 | Temp 97.8°F | Ht 66.0 in | Wt 193.0 lb

## 2018-01-03 DIAGNOSIS — I10 Essential (primary) hypertension: Secondary | ICD-10-CM | POA: Diagnosis not present

## 2018-01-03 DIAGNOSIS — H3500 Unspecified background retinopathy: Secondary | ICD-10-CM | POA: Insufficient documentation

## 2018-01-03 DIAGNOSIS — H3412 Central retinal artery occlusion, left eye: Secondary | ICD-10-CM | POA: Insufficient documentation

## 2018-01-03 DIAGNOSIS — Z794 Long term (current) use of insulin: Secondary | ICD-10-CM | POA: Diagnosis not present

## 2018-01-03 DIAGNOSIS — H547 Unspecified visual loss: Secondary | ICD-10-CM | POA: Diagnosis not present

## 2018-01-03 DIAGNOSIS — E113493 Type 2 diabetes mellitus with severe nonproliferative diabetic retinopathy without macular edema, bilateral: Secondary | ICD-10-CM | POA: Insufficient documentation

## 2018-01-03 DIAGNOSIS — E7800 Pure hypercholesterolemia, unspecified: Secondary | ICD-10-CM | POA: Insufficient documentation

## 2018-01-03 DIAGNOSIS — H409 Unspecified glaucoma: Secondary | ICD-10-CM | POA: Insufficient documentation

## 2018-01-03 DIAGNOSIS — Z79899 Other long term (current) drug therapy: Secondary | ICD-10-CM | POA: Insufficient documentation

## 2018-01-03 DIAGNOSIS — E7849 Other hyperlipidemia: Secondary | ICD-10-CM | POA: Diagnosis not present

## 2018-01-03 DIAGNOSIS — K219 Gastro-esophageal reflux disease without esophagitis: Secondary | ICD-10-CM | POA: Diagnosis not present

## 2018-01-03 DIAGNOSIS — Z1211 Encounter for screening for malignant neoplasm of colon: Secondary | ICD-10-CM | POA: Diagnosis not present

## 2018-01-03 DIAGNOSIS — F419 Anxiety disorder, unspecified: Secondary | ICD-10-CM | POA: Insufficient documentation

## 2018-01-03 DIAGNOSIS — E1169 Type 2 diabetes mellitus with other specified complication: Secondary | ICD-10-CM

## 2018-01-03 DIAGNOSIS — Z7902 Long term (current) use of antithrombotics/antiplatelets: Secondary | ICD-10-CM | POA: Insufficient documentation

## 2018-01-03 DIAGNOSIS — Z7982 Long term (current) use of aspirin: Secondary | ICD-10-CM | POA: Insufficient documentation

## 2018-01-03 DIAGNOSIS — D649 Anemia, unspecified: Secondary | ICD-10-CM | POA: Insufficient documentation

## 2018-01-03 DIAGNOSIS — H401123 Primary open-angle glaucoma, left eye, severe stage: Secondary | ICD-10-CM | POA: Insufficient documentation

## 2018-01-03 LAB — POCT GLYCOSYLATED HEMOGLOBIN (HGB A1C): Hemoglobin A1C: 9.2 % — AB (ref 4.0–5.6)

## 2018-01-03 LAB — GLUCOSE, POCT (MANUAL RESULT ENTRY): POC Glucose: 134 mg/dl — AB (ref 70–99)

## 2018-01-03 MED ORDER — INSULIN GLARGINE 100 UNIT/ML SOLOSTAR PEN: 50.0000 [IU] | PEN_INJECTOR | Freq: Every day | SUBCUTANEOUS | 3 refills | Status: DC

## 2018-01-03 MED ORDER — FAMOTIDINE 20 MG PO TABS: 20.0000 mg | ORAL_TABLET | Freq: Two times a day (BID) | ORAL | 1 refills | Status: DC

## 2018-01-03 MED ORDER — CLOPIDOGREL BISULFATE 75 MG PO TABS: 75.0000 mg | ORAL_TABLET | Freq: Every day | ORAL | 1 refills | Status: DC

## 2018-01-03 MED ORDER — METFORMIN HCL 1000 MG PO TABS: 1000.0000 mg | ORAL_TABLET | Freq: Two times a day (BID) | ORAL | 1 refills | Status: DC

## 2018-01-03 MED ORDER — ATORVASTATIN CALCIUM 80 MG PO TABS: 80.0000 mg | ORAL_TABLET | Freq: Every day | ORAL | 1 refills | Status: DC

## 2018-01-03 MED ORDER — LOSARTAN POTASSIUM-HCTZ 100-25 MG PO TABS: 1.0000 | ORAL_TABLET | Freq: Every day | ORAL | 1 refills | Status: DC

## 2018-01-03 MED FILL — LOSARTAN-HCTZ 100-25 MG TAB: 100-25 | 90 days supply | Qty: 90 | Fill #0

## 2018-01-03 MED FILL — ATORVASTATIN 80 MG TABLET: 80 | 90 days supply | Qty: 90 | Fill #0

## 2018-01-03 MED FILL — FAMOTIDINE 20 MG TABLET: 20 | 30 days supply | Qty: 60 | Fill #0

## 2018-01-03 NOTE — Progress Notes (Signed)
Subjective:  Patient ID: Adrienne [name], female    DOB: Jun 07, 1955  Age: 62 y.o. 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 Her A1c is 9.2 which has trended up from 7.3 previously and she endorses unhealthy eating, emotional eating ever since she broke up with her boyfriend 3 months ago but promises to return to a diabetic diet. Anxiety has improved significantly and she was able to use the scat bus today on her own which she is proud of.  She does not require medications for her anxiety anymore and has discontinued Lexapro She would like to be switched to another medication for control of her reflux due to recall of Zantac. Her daughter is with her today who has FMLA paperwork due to the fact that she has to bring her mom for doctor's appointments.  FMLA paperwork will be completed and she will be notified once it is.  Past Medical History:  Diagnosis Date  . Diabetes ([place])   . High cholesterol   . Hypertension     Past Surgical History:  Procedure Laterality Date  . CATARACT EXTRACTION, BILATERAL     Laser eye surgery, cataract surgery bi-lat  . CESAREAN SECTION     [place], [place]  . EYE SURGERY Bilateral 2012   LASER, CATARACT  . TEE WITHOUT CARDIOVERSION N/A 12/31/2013   Procedure: TRANSESOPHAGEAL ECHOCARDIOGRAM (TEE);  Surgeon: Laverda Page, MD;  Location: [facility];  Service: Cardiovascular;  Laterality: N/A;  H&P in file  . TUBAL LIGATION Bilateral     Allergies  Allergen Reactions  . Codeine Itching  . Hydrocodone Itching  . Oxycodone Itching  . Penicillins  Itching  . Clindamycin/Lincomycin Itching  . Niacin And Related Itching  . Penicillin G Itching  . Niacin Itching and Anxiety    Facial flushing     Outpatient Medications Prior to Visit  Medication Sig Dispense Refill  . aspirin EC 81 MG tablet Take 81 mg by mouth daily.     . blood glucose meter kit and supplies Dispense based on patient and insurance preference. Use up to four times daily as directed. (FOR ICD-9 250.00, 250.01). 1 each 0  . Blood Glucose Monitoring Suppl (PRODIGY VOICE BLOOD GLUCOSE) w/Device KIT Use as directed. 1 kit 0  . brimonidine (ALPHAGAN) 0.15 % ophthalmic solution Place 1 drop into both eyes 2 (two) times daily.     . cetirizine (ZYRTEC) 10 MG tablet Take 1 tablet (10 mg total) by mouth daily. 30 tablet 3  . dorzolamide-timolol (COSOPT) 22.3-6.8 MG/ML ophthalmic solution Place 1 drop into both eyes 2 (two) times daily.   2  . estradiol (ESTRACE) 0.1 MG/GM vaginal cream Place 1 Applicatorful vaginally at bedtime. For 2 weeks then alternate with Prometrium capsules 42.5 g 1  . fluticasone (FLONASE) 50 MCG/ACT nasal spray Place 2 sprays into both nostrils daily. 16 g 6  . glucose blood test strip Use as instructed up to four times daily 100 each 2  . Insulin Pen Needle 31G X 5 MM MISC 1 Act by Does not apply route 4 (four) times daily - after meals and at bedtime. 300 each 0  .  metoCLOPramide (REGLAN) 10 MG tablet Take 1 tablet (10 mg total) by mouth every 8 (eight) hours as needed for nausea. 90 tablet 1  . Vitamin D, Ergocalciferol, (DRISDOL) 1.25 MG (50000 UT) CAPS capsule TAKE 1 CAPSULE (50,000 UNITS TOTAL) BY MOUTH EVERY 7 (SEVEN) DAYS. 4 capsule 0  . atorvastatin (LIPITOR) 80 MG tablet Take 1 tablet (80 mg total) by mouth daily. 90 tablet 1  . clopidogrel (PLAVIX) 75 MG tablet Take 1 tablet (75 mg total) by mouth daily. 30 tablet 3  . Insulin Glargine (LANTUS SOLOSTAR) 100 UNIT/ML Solostar Pen Inject 50 Units into the skin daily at 10 pm. 5 pen 3  .  losartan-hydrochlorothiazide (HYZAAR) 100-25 MG tablet Take 1 tablet by mouth daily. 30 tablet 3  . metFORMIN (GLUCOPHAGE) 1000 MG tablet Take 1 tablet (1,000 mg total) by mouth 2 (two) times daily. 180 tablet 3  . progesterone (PROMETRIUM) 100 MG capsule Take 1 capsule (100 mg total) by mouth daily. For 2 weeks after use of Estrace cream 14 capsule 1  . traZODone (DESYREL) 50 MG tablet Take 1 tablet (50 mg total) by mouth at bedtime as needed for sleep. 30 tablet 3  . escitalopram (LEXAPRO) 20 MG tablet Take 1 tablet (20 mg total) by mouth daily. (Patient not taking: Reported on 10/04/2017) 30 tablet 3  . insulin aspart (NOVOLOG FLEXPEN) 100 UNIT/ML FlexPen Inject 0-12 Units into the skin 3 (three) times daily before meals. As per sliding scale (Patient not taking: Reported on 05/08/2017) 15 mL 3  . ranitidine (ZANTAC) 150 MG tablet Take 1 tablet (150 mg total) by mouth 2 (two) times daily. (Patient not taking: Reported on 01/03/2018) 60 tablet 3   No facility-administered medications prior to visit.     ROS Review of Systems  Constitutional: Negative for activity change, appetite change and fatigue.  HENT: Negative for congestion, sinus pressure and sore throat.   Eyes: Positive for visual disturbance.  Respiratory: Negative for cough, chest tightness, shortness of breath and wheezing.   Cardiovascular: Negative for chest pain and palpitations.  Gastrointestinal: Negative for abdominal distention, abdominal pain and constipation.  Endocrine: Negative for polydipsia.  Genitourinary: Negative for dysuria and frequency.  Musculoskeletal: Negative for arthralgias and back pain.  Skin: Negative for rash.  Neurological: Negative for tremors, light-headedness and numbness.  Hematological: Does not bruise/bleed easily.  Psychiatric/Behavioral: Negative for agitation and behavioral problems.    Objective:  BP 136/80   Pulse 79   Temp 97.8 F (36.6 C) (Oral)   Ht 5' 6" (1.676 m)   Wt 193 lb  (87.5 kg)   SpO2 97%   BMI 31.15 kg/m   BP/Weight 01/03/2018 10/04/2017 3/32/9518  Systolic BP 841 660 630  Diastolic BP 80 78 91  Wt. (Lbs) 193 185.4 187  BMI 31.15 29.92 30.18      Physical Exam Constitutional:      Appearance: She is well-developed.  Eyes:     Comments: Visually impaired  Cardiovascular:     Rate and Rhythm: Normal rate.     Heart sounds: Normal heart sounds. No murmur.  Pulmonary:     Effort: Pulmonary effort is normal.     Breath sounds: Normal breath sounds. No wheezing or rales.  Chest:     Chest wall: No tenderness.  Abdominal:     General: Bowel sounds are normal. There is no distension.     Palpations: Abdomen is soft. There is no mass.     Tenderness: There is no abdominal  tenderness.  Musculoskeletal: Normal range of motion.  Neurological:     Mental Status: She is alert and oriented to person, place, and time.  Psychiatric:        Mood and Affect: Mood normal.        Behavior: Behavior normal.     Lab Results  Component Value Date   HGBA1C 9.2 (A) 01/03/2018     Assessment & Plan:   1. 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 Counseled on Diabetic diet, my plate method, 916 minutes of moderate intensity exercise/week Keep blood sugar logs with fasting goals of 80-120 mg/dl, random of less than 180 and in the event of sugars less than 60 mg/dl or greater than 400 mg/dl please notify the clinic ASAP. It is recommended that you undergo annual eye exams and annual foot exams. Pneumonia vaccine is recommended. - POCT glucose (manual entry) - POCT glycosylated hemoglobin (Hb A1C)  2. Other hyperlipidemia Controlled Low-cholesterol diet - atorvastatin  (LIPITOR) 80 MG tablet; Take 1 tablet (80 mg total) by mouth daily.  Dispense: 90 tablet; Refill: 1 - clopidogrel (PLAVIX) 75 MG tablet; Take 1 tablet (75 mg total) by mouth daily.  Dispense: 90 tablet; Refill: 1  3. Type 2 diabetes mellitus with other specified complication, with long-term current use of insulin (HCC) See #1 above - clopidogrel (PLAVIX) 75 MG tablet; Take 1 tablet (75 mg total) by mouth daily.  Dispense: 90 tablet; Refill: 1 - Insulin Glargine (LANTUS SOLOSTAR) 100 UNIT/ML Solostar Pen; Inject 50 Units into the skin daily at 10 pm.  Dispense: 5 pen; Refill: 3 - metFORMIN (GLUCOPHAGE) 1000 MG tablet; Take 1 tablet (1,000 mg total) by mouth 2 (two) times daily.  Dispense: 180 tablet; Refill: 1  4. Essential hypertension Controlled - losartan-hydrochlorothiazide (HYZAAR) 100-25 MG tablet; Take 1 tablet by mouth daily.  Dispense: 90 tablet; Refill: 1  5. Screening for colon cancer - Ambulatory referral to Gastroenterology  6. Gastroesophageal reflux disease without esophagitis Switch from Zantac to Pepcid - famotidine (PEPCID) 20 MG tablet; Take 1 tablet (20 mg total) by mouth 2 (two) times daily.  Dispense: 180 tablet; Refill: 1   Meds ordered this encounter  Medications  . famotidine (PEPCID) 20 MG tablet    Sig: Take 1 tablet (20 mg total) by mouth 2 (two) times daily.    Dispense:  180 tablet    Refill:  1    Discontinue Zantac  . atorvastatin (LIPITOR) 80 MG tablet    Sig: Take 1 tablet (80 mg total) by mouth daily.    Dispense:  90 tablet    Refill:  1    Discontinue previous dose  . clopidogrel (PLAVIX) 75 MG tablet    Sig: Take 1 tablet (75 mg total) by mouth daily.    Dispense:  90 tablet    Refill:  1  . Insulin Glargine (LANTUS SOLOSTAR) 100 UNIT/ML Solostar Pen    Sig: Inject 50 Units into the skin daily at 10 pm.    Dispense:  5 pen    Refill:  3    Dispense pens- visually impaired.  Marland Kitchen losartan-hydrochlorothiazide (HYZAAR) 100-25 MG tablet    Sig:  Take 1 tablet by mouth daily.    Dispense:  90  tablet    Refill:  1  . metFORMIN (GLUCOPHAGE) 1000 MG tablet    Sig: Take 1 tablet (1,000 mg total) by mouth 2 (two) times daily.    Dispense:  180 tablet    Refill:  1    Follow-up: Return in about 3 months (around 04/04/2018) for follow up on Diabetes Mellitus.   Charlott Rakes MD

## 2018-01-03 NOTE — Progress Notes (Signed)
Patient would like a new medication to replace Zantac.

## 2018-01-22 MED FILL — metFORMIN HCL 1000 MG TABS: 1000 | 30 days supply | Qty: 60 | Fill #3

## 2018-01-29 MED FILL — CLOPIDOGREL 75 MG TABLET: 75 | 30 days supply | Qty: 30 | Fill #3

## 2018-01-29 MED FILL — LANTUS SOLOSTAR 100 UNITS/M: 100 | 30 days supply | Qty: 15 | Fill #3

## 2018-02-19 ENCOUNTER — Telehealth: Payer: Self-pay | Admitting: Family Medicine

## 2018-02-19 NOTE — Telephone Encounter (Signed)
Patient needs medication for yeast infection.

## 2018-02-19 NOTE — Telephone Encounter (Signed)
Pt called in stating vitam D pills have not been refilled wants to know if she needs to take anyhting in place of that. Pt also states she feels a yeast infection coming and would like to be prescribed something for that. Please follow up

## 2018-02-20 MED ORDER — FLUCONAZOLE 150 MG PO TABS: 150.0000 mg | ORAL_TABLET | Freq: Once | ORAL | 0 refills | Status: AC

## 2018-02-20 MED FILL — metFORMIN HCL 1000 MG TABS: 1000 | 30 days supply | Qty: 60 | Fill #4

## 2018-02-20 MED FILL — FAMOTIDINE 20 MG TABLET: 20 | 30 days supply | Qty: 60 | Fill #1

## 2018-02-20 MED FILL — FLUCONAZOLE 150 MG TABS: 150 | 1 days supply | Qty: 1 | Fill #0

## 2018-02-20 NOTE — Telephone Encounter (Signed)
Prescription sent to pharmacy.

## 2018-02-21 MED FILL — LANTUS SOLOSTAR 100 UNITS/M: 100 | 30 days supply | Qty: 15 | Fill #0

## 2018-02-21 NOTE — Telephone Encounter (Signed)
Patient was called and informed of medication being sent to pharmacy. 

## 2018-02-27 ENCOUNTER — Telehealth: Payer: Self-pay | Admitting: Family Medicine

## 2018-02-27 NOTE — Telephone Encounter (Signed)
Patient would like to know if she is to still take Vit D pills. Please follow up.

## 2018-02-27 NOTE — Telephone Encounter (Signed)
Patient would like to know what she should be taking in terms of the vitamin D. Please follow up.

## 2018-02-28 MED FILL — CLOPIDOGREL 75 MG TABLET: 75 | 90 days supply | Qty: 90 | Fill #0

## 2018-02-28 NOTE — Telephone Encounter (Signed)
She only needs to take the vitamin D for 2 months and we will repeat levels at her next visit.

## 2018-03-01 MED ORDER — VITAMIN D (ERGOCALCIFEROL) 1.25 MG (50000 UNIT) PO CAPS: 50000.0000 [IU] | ORAL_CAPSULE | ORAL | 0 refills | Status: DC

## 2018-03-01 MED FILL — VIT D2 1.25 MG (50,000 UNIT: 1.25 MG | 28 days supply | Qty: 4 | Fill #0

## 2018-03-01 NOTE — Telephone Encounter (Signed)
Patient was called and a voicemail was left informing patient to return phone call. 

## 2018-03-01 NOTE — Telephone Encounter (Signed)
Patient states that she has no more VIT D pills and would like a refill.

## 2018-03-01 NOTE — Telephone Encounter (Signed)
Call placed to patient and patient was informed of medication refill.

## 2018-03-01 NOTE — Telephone Encounter (Signed)
Done

## 2018-03-19 MED FILL — metFORMIN HCL 1000 MG TABS: 1000 | 30 days supply | Qty: 60 | Fill #5

## 2018-03-21 MED FILL — LANTUS SOLOSTAR 100 UNITS/M: 100 | 30 days supply | Qty: 15 | Fill #1

## 2018-04-09 ENCOUNTER — Ambulatory Visit: Payer: Medicare HMO | Admitting: Family Medicine

## 2018-04-18 ENCOUNTER — Other Ambulatory Visit: Payer: Self-pay

## 2018-04-18 ENCOUNTER — Encounter: Payer: Self-pay | Admitting: Family Medicine

## 2018-04-18 ENCOUNTER — Ambulatory Visit: Payer: Medicare HMO | Attending: Family Medicine | Admitting: Family Medicine

## 2018-04-18 DIAGNOSIS — K3184 Gastroparesis: Secondary | ICD-10-CM | POA: Diagnosis not present

## 2018-04-18 DIAGNOSIS — R0982 Postnasal drip: Secondary | ICD-10-CM | POA: Diagnosis not present

## 2018-04-18 DIAGNOSIS — Z794 Long term (current) use of insulin: Secondary | ICD-10-CM

## 2018-04-18 DIAGNOSIS — E7849 Other hyperlipidemia: Secondary | ICD-10-CM | POA: Diagnosis not present

## 2018-04-18 DIAGNOSIS — E1169 Type 2 diabetes mellitus with other specified complication: Secondary | ICD-10-CM | POA: Diagnosis not present

## 2018-04-18 DIAGNOSIS — I10 Essential (primary) hypertension: Secondary | ICD-10-CM

## 2018-04-18 MED ORDER — METOCLOPRAMIDE HCL 10 MG PO TABS: 10.0000 mg | ORAL_TABLET | Freq: Three times a day (TID) | ORAL | 1 refills | Status: DC | PRN

## 2018-04-18 MED ORDER — LOSARTAN POTASSIUM-HCTZ 100-25 MG PO TABS: 1.0000 | ORAL_TABLET | Freq: Every day | ORAL | 1 refills | Status: DC

## 2018-04-18 MED ORDER — INSULIN GLARGINE 100 UNIT/ML SOLOSTAR PEN: 50.0000 [IU] | PEN_INJECTOR | Freq: Every day | SUBCUTANEOUS | 3 refills | Status: DC

## 2018-04-18 MED ORDER — ATORVASTATIN CALCIUM 80 MG PO TABS: 80.0000 mg | ORAL_TABLET | Freq: Every day | ORAL | 1 refills | Status: DC

## 2018-04-18 MED ORDER — METFORMIN HCL 1000 MG PO TABS: 1000.0000 mg | ORAL_TABLET | Freq: Two times a day (BID) | ORAL | 1 refills | Status: DC

## 2018-04-18 MED ORDER — CLOPIDOGREL BISULFATE 75 MG PO TABS: 75.0000 mg | ORAL_TABLET | Freq: Every day | ORAL | 1 refills | Status: DC

## 2018-04-18 MED ORDER — CETIRIZINE HCL 10 MG PO TABS: 10.0000 mg | ORAL_TABLET | Freq: Every day | ORAL | 3 refills | Status: DC

## 2018-04-18 MED FILL — LOSARTAN-HCTZ 100-25 MG TAB: 100-25 | 90 days supply | Qty: 90 | Fill #0

## 2018-04-18 MED FILL — CETIRIZINE HCL 10 MG TABS: 10 | 30 days supply | Qty: 30 | Fill #0

## 2018-04-18 MED FILL — LANTUS SOLOSTAR 100 UNITS/M: 100 | 30 days supply | Qty: 15 | Fill #0

## 2018-04-18 MED FILL — METOCLOPRAMIDE 10 MG TABLET: 10 | 30 days supply | Qty: 90 | Fill #0

## 2018-04-18 MED FILL — ATORVASTATIN 80 MG TABLET: 80 | 90 days supply | Qty: 90 | Fill #0

## 2018-04-18 MED FILL — CLOPIDOGREL 75 MG TABLET: 75 | 90 days supply | Qty: 90 | Fill #0

## 2018-04-18 MED FILL — metFORMIN HCL 1000 MG TABS: 1000 | 90 days supply | Qty: 180 | Fill #0

## 2018-04-18 NOTE — Progress Notes (Signed)
Patient has been called and DOB has been verified. Patient has been screened and transferred to PCP to start phone visit.  C/C:Diabtes  Refills: eyedrops, Plavix, metformin, reglan

## 2018-04-18 NOTE — Progress Notes (Signed)
Virtual Visit via Telephone Note  I connected with Adrienne [name] on 04/18/18 at  3:10 PM EDT by telephone and verified that I am speaking with the correct person using two identifiers.   I discussed the limitations, risks, security and privacy concerns of performing an evaluation and management service by telephone and the availability of in person appointments. I also discussed with the patient that there may be a patient responsible charge related to this service. The patient expressed understanding and agreed to proceed.   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 She is needing refills on her eyedrops and is yet to see ophthalmologist.  Advised to call her ophthalmologist at [facility] for refills and also schedule follow-up appointment.  With regards to her diabetes mellitus she was never able to receive a talking glucometer and has been unable to check her sugars due to her visual impairment hence she does not use her NovoLog but has been compliant with Lantus. She lives alone and rarely goes out for walk. She complains of cough which she thinks is secondary to seasonal allergies and uses cough drops.  Denies wheezing or chest pain Requests refill of all her chronic medications.   Observations/Objective: Awake, alert, oriented x3 Not in acute distress Normal mood  CMP Latest Ref Rng & Units 10/04/2017 05/10/2017 08/29/2016  Glucose 65 - 99 mg/dL 282(H) 101(H) 184(H)  BUN 8 - 27 mg/dL 21 28(H) 21  Creatinine 0.57 - 1.00 mg/dL 1.34(H) 1.44(H) 1.15(H)  Sodium 134 - 144 mmol/L 138 141 137  Potassium 3.5 - 5.2 mmol/L 5.1 4.4 4.3  Chloride 96 - 106 mmol/L  99 103 99  CO2 20 - 29 mmol/L 22 22 24   Calcium 8.7 - 10.3 mg/dL 9.6 9.9 9.2  Total Protein 6.0 - 8.5 g/dL - 6.9 6.8  Total Bilirubin 0.0 - 1.2 mg/dL - 0.3 0.2  Alkaline Phos 39 - 117 IU/L - 73 105  AST 0 - 40 IU/L - 13 14  ALT 0 - 32 IU/L - 11 13    Lipid Panel     Component Value Date/Time   CHOL 243 (H) 05/10/2017 0951   TRIG 144 05/10/2017 0951   HDL 47 05/10/2017 0951   CHOLHDL 5.2 (H) 05/10/2017 0951   CHOLHDL 2.7 01/04/2016 1138   VLDL 12 01/04/2016 1138   LDLCALC 167 (H) 05/10/2017 0951    Lab Results  Component Value Date   HGBA1C 9.2 (A) 01/03/2018    Assessment and Plan: 1. Type 2 diabetes mellitus with other specified complication, with long-term current use of insulin (HCC) Uncontrolled Unable to assess recent blood sugars as she has not been checking her sugars due to visual impairment Continue current regimen and will check A1c at next visit Unable to use NovoLog as well due to not checking blood sugars Advised to comply with a diabetic diet - metFORMIN (GLUCOPHAGE) 1000 MG tablet; Take 1 tablet (1,000 mg total) by mouth 2 (two) times daily.  Dispense: 180 tablet; Refill: 1 - Insulin Glargine (LANTUS SOLOSTAR) 100 UNIT/ML Solostar Pen; Inject 50 Units into the skin daily at 10 pm.  Dispense: 5 pen; Refill: 3 - clopidogrel (PLAVIX) 75 MG tablet; Take 1 tablet (75 mg  total) by mouth daily.  Dispense: 90 tablet; Refill: 1  2. Post-nasal drip Stable - cetirizine (ZYRTEC) 10 MG tablet; Take 1 tablet (10 mg total) by mouth daily.  Dispense: 30 tablet; Refill: 3  3. Gastroparesis Controlled - metoCLOPramide (REGLAN) 10 MG tablet; Take 1 tablet (10 mg total) by mouth every 8 (eight) hours as needed for nausea.  Dispense: 90 tablet; Refill: 1  4. Other hyperlipidemia Uncontrolled Lipid panel at next visit - clopidogrel (PLAVIX) 75 MG tablet; Take 1 tablet (75 mg total) by mouth daily.  Dispense: 90 tablet; Refill: 1 - atorvastatin (LIPITOR) 80 MG tablet; Take 1  tablet (80 mg total) by mouth daily.  Dispense: 90 tablet; Refill: 1  5. Essential hypertension Unable to assess control today due to virtual visit Continue current management Counseled on blood pressure goal of less than 130/80, low-sodium, DASH diet, medication compliance, 150 minutes of moderate intensity exercise per week. Discussed medication compliance, adverse effects. - losartan-hydrochlorothiazide (HYZAAR) 100-25 MG tablet; Take 1 tablet by mouth daily.  Dispense: 90 tablet; Refill: 1   Follow Up Instructions:    I discussed the assessment and treatment plan with the patient. The patient was provided an opportunity to ask questions and all were answered. The patient agreed with the plan and demonstrated an understanding of the instructions.   The patient was advised to call back or seek an in-person evaluation if the symptoms worsen or if the condition fails to improve as anticipated.  I provided 15 minutes of non-face-to-face time during this encounter.   Charlott Rakes, MD

## 2018-05-03 ENCOUNTER — Telehealth: Payer: Self-pay | Admitting: Family Medicine

## 2018-05-03 NOTE — Telephone Encounter (Signed)
Nurse called the patient's home phone number but received no answer and message was left on the voicemail for the patient to call back.  Return phone number given. 

## 2018-05-03 NOTE — Telephone Encounter (Signed)
Patients call taken.  Patient identified by name and date of birth.  Patient called in for Covid-19 advise.    Patient is an at risk patient due to comorbidities.   Patient denied fever, shortness of breath worse than baseline, no present cough.  Patient was informed of COVID-19 symptoms.  Patient given precautions. Questions related to exposure answered.  Advise given about how to protect one's self.   Patient acknowledged understanding of instructions.

## 2018-05-03 NOTE — Telephone Encounter (Signed)
Patient called because she has been quarantined and would like to know when she can socialize with others and has more FU questions. Please follow up.

## 2018-05-17 MED FILL — LANTUS SOLOSTAR 100 UNITS/M: 100 | 30 days supply | Qty: 15 | Fill #1

## 2018-05-17 MED FILL — METOCLOPRAMIDE 10 MG TABLET: 10 | 30 days supply | Qty: 90 | Fill #1

## 2018-05-17 MED FILL — CETIRIZINE HCL 10 MG TABS: 10 | 30 days supply | Qty: 30 | Fill #1

## 2018-06-20 MED FILL — LANTUS SOLOSTAR 100 UNITS/M: 100 | 30 days supply | Qty: 15 | Fill #2

## 2018-06-20 MED FILL — CETIRIZINE HCL 10 MG TABS: 10 | 30 days supply | Qty: 30 | Fill #2

## 2018-07-13 MED FILL — LOSARTAN-HCTZ 100-25 MG TAB: 100-25 | 90 days supply | Qty: 90 | Fill #1

## 2018-07-13 MED FILL — metFORMIN HCL 1000 MG TABS: 1000 | 90 days supply | Qty: 180 | Fill #1

## 2018-07-13 MED FILL — ATORVASTATIN 80 MG TABLET: 80 | 90 days supply | Qty: 90 | Fill #1

## 2018-07-13 MED FILL — CETIRIZINE HCL 10 MG TABS: 10 | 30 days supply | Qty: 30 | Fill #3

## 2018-07-13 MED FILL — LANTUS SOLOSTAR 100 UNITS/M: 100 | 30 days supply | Qty: 15 | Fill #3

## 2018-07-13 MED FILL — CLOPIDOGREL 75 MG TABLET: 75 | 90 days supply | Qty: 90 | Fill #1

## 2018-07-30 ENCOUNTER — Other Ambulatory Visit: Payer: Self-pay

## 2018-07-30 ENCOUNTER — Encounter: Payer: Self-pay | Admitting: Family Medicine

## 2018-07-30 ENCOUNTER — Ambulatory Visit: Payer: Medicare HMO | Attending: Family Medicine | Admitting: Family Medicine

## 2018-07-30 DIAGNOSIS — E559 Vitamin D deficiency, unspecified: Secondary | ICD-10-CM

## 2018-07-30 DIAGNOSIS — Z0000 Encounter for general adult medical examination without abnormal findings: Secondary | ICD-10-CM

## 2018-07-30 DIAGNOSIS — K5909 Other constipation: Secondary | ICD-10-CM

## 2018-07-30 DIAGNOSIS — E1165 Type 2 diabetes mellitus with hyperglycemia: Secondary | ICD-10-CM

## 2018-07-30 DIAGNOSIS — Z794 Long term (current) use of insulin: Secondary | ICD-10-CM

## 2018-07-30 DIAGNOSIS — E1169 Type 2 diabetes mellitus with other specified complication: Secondary | ICD-10-CM

## 2018-07-30 MED ORDER — TRULICITY 0.75 MG/0.5ML ~~LOC~~ SOAJ: 0.7500 mg | Freq: Once | SUBCUTANEOUS | 3 refills | Status: AC

## 2018-07-30 MED ORDER — LINACLOTIDE 72 MCG PO CAPS: 72.0000 ug | ORAL_CAPSULE | Freq: Every day | ORAL | 3 refills | Status: DC

## 2018-07-30 NOTE — Progress Notes (Signed)
Virtual Visit via Telephone Note  I connected with Adrienne [name], on 07/30/2018 at 4:29 PM by telephone due to the COVID-19 pandemic and verified that I am speaking with the correct person using two identifiers.   Consent: I discussed the limitations, risks, security and privacy concerns of performing an evaluation and management service by telephone and the availability of in person appointments. I also discussed with the patient that there may be a patient responsible charge related to this service. The patient expressed understanding and agreed to proceed.   Location of Patient: Home  Location of Provider: Clinic   Persons participating in Telemedicine visit: Jadence Kinlaw Farrington-CMA Dr. 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 She complains of excessive gassiness and constipation which is only relieved by the use of a Fleet enema.  Denies nausea, vomiting and due to visual impairment is unable to state if she has blood in her stools.   She is also unable to use a glucometer and even the talking glucometer due to her visual impairment.  Currently home alone but states her son went to [place] and will be returning within the month.  Past Medical History:  Diagnosis Date  . Diabetes ([place])   . High cholesterol   . Hypertension    Allergies  Allergen Reactions  . Codeine Itching  . Hydrocodone Itching  . Oxycodone Itching  . Penicillins Itching  . Clindamycin/Lincomycin Itching  . Niacin And Related Itching  . Penicillin G Itching  . Niacin Itching and Anxiety    Facial flushing    Current Outpatient  Medications on File Prior to Visit  Medication Sig Dispense Refill  . atorvastatin (LIPITOR) 80 MG tablet Take 1 tablet (80 mg total) by mouth daily. 90 tablet 1  . blood glucose meter kit and supplies Dispense based on patient and insurance preference. Use up to four times daily as directed. (FOR ICD-9 250.00, 250.01). 1 each 0  . Blood Glucose Monitoring Suppl (PRODIGY VOICE BLOOD GLUCOSE) w/Device KIT Use as directed. 1 kit 0  . brimonidine (ALPHAGAN) 0.15 % ophthalmic solution Place 1 drop into both eyes 2 (two) times daily.     . cetirizine (ZYRTEC) 10 MG tablet Take 1 tablet (10 mg total) by mouth daily. 30 tablet 3  . clopidogrel (PLAVIX) 75 MG tablet Take 1 tablet (75 mg total) by mouth daily. 90 tablet 1  . famotidine (PEPCID) 20 MG tablet Take 1 tablet (20 mg total) by mouth 2 (two) times daily. 180 tablet 1  . fluticasone (FLONASE) 50 MCG/ACT nasal spray Place 2 sprays into both nostrils daily. 16 g 6  . glucose blood test strip Use as instructed up to four times daily 100 each 2  . Insulin Glargine (LANTUS SOLOSTAR) 100 UNIT/ML Solostar Pen Inject 50 Units into the skin daily at 10 pm. 5 pen 3  . Insulin Pen Needle 31G X 5 MM MISC 1 Act by Does not apply route 4 (four) times daily - after meals and at bedtime. 300 each 0  . losartan-hydrochlorothiazide (HYZAAR) 100-25 MG tablet Take 1 tablet by mouth daily. 90 tablet 1  . metFORMIN (GLUCOPHAGE) 1000 MG tablet Take 1 tablet (  1,000 mg total) by mouth 2 (two) times daily. 180 tablet 1  . metoCLOPramide (REGLAN) 10 MG tablet Take 1 tablet (10 mg total) by mouth every 8 (eight) hours as needed for nausea. 90 tablet 1  . aspirin EC 81 MG tablet Take 81 mg by mouth daily.     . dorzolamide-timolol (COSOPT) 22.3-6.8 MG/ML ophthalmic solution Place 1 drop into both eyes 2 (two) times daily.   2  . insulin aspart (NOVOLOG FLEXPEN) 100 UNIT/ML FlexPen Inject 0-12 Units into the skin 3 (three) times daily before meals. As per sliding scale (Patient  not taking: Reported on 05/08/2017) 15 mL 3   No current facility-administered medications on file prior to visit.     Observations/Objective: Awake, alert, oriented x3 Not in acute distress  Assessment and Plan: 1. Chronic constipation Increase fiber intake, intake of water We will commence Linzess as this has only been responsive to use of Fleet enemas so far - linaclotide (LINZESS) 72 MCG capsule; Take 1 capsule (72 mcg total) by mouth daily before breakfast.  Dispense: 30 capsule; Refill: 3  2. Vitamin D deficiency Completed course of Drisdol We will repeat vitamin D level - VITAMIN D 25 Hydroxy (Vit-D Deficiency, Fractures)  3. Type 2 diabetes mellitus with other specified complication, with long-term current use of insulin ([place]) Uncontrolled with A1c of 9.2 from 12/2017 She is due for an A1c and will be coming in this week for this Would have left to place her on Victoza however due to visual impairment dosing will be a problem Commenced on Trulicity which will help a bit with weight loss as well - CMP14+EGFR; Future - Lipid panel; Future - Microalbumin / creatinine urine ratio; Future - Hemoglobin A1c - Dulaglutide (TRULICITY) 4.30 TU/8.4SB SOPN; Inject 0.75 mg into the skin once for 1 dose.  Dispense: 4 pen; Refill: 3  HCM-referred for mammogram and colonoscopy Follow Up Instructions: 3 months   I discussed the assessment and treatment plan with the patient. The patient was provided an opportunity to ask questions and all were answered. The patient agreed with the plan and demonstrated an understanding of the instructions.   The patient was advised to call back or seek an in-person evaluation if the symptoms worsen or if the condition fails to improve as anticipated.     I provided 14 minutes total of non-face-to-face time during this encounter including median intraservice time, reviewing previous notes, labs, imaging, medications, management and patient verbalized  understanding.     Charlott Rakes, MD, FAAFP. [facility] and [place] [place], [place]   07/30/2018, 4:29 PM

## 2018-07-30 NOTE — Progress Notes (Signed)
Patient has been called and DOB has been verified. Patient has been screened and transferred to PCP to start phone visit.   Patient is having gas pain.

## 2018-07-31 MED FILL — TRULICITY 0.75 MG/0.5 ML PE: 0.75 | 28 days supply | Qty: 2 | Fill #0

## 2018-08-02 ENCOUNTER — Other Ambulatory Visit: Payer: Medicare HMO

## 2018-08-02 ENCOUNTER — Ambulatory Visit: Payer: Medicare HMO | Attending: Family Medicine | Admitting: Pharmacist

## 2018-08-02 ENCOUNTER — Other Ambulatory Visit: Payer: Self-pay

## 2018-08-02 ENCOUNTER — Encounter: Payer: Self-pay | Admitting: Pharmacist

## 2018-08-02 DIAGNOSIS — E1165 Type 2 diabetes mellitus with hyperglycemia: Secondary | ICD-10-CM

## 2018-08-02 DIAGNOSIS — E1169 Type 2 diabetes mellitus with other specified complication: Secondary | ICD-10-CM | POA: Diagnosis not present

## 2018-08-02 DIAGNOSIS — Z794 Long term (current) use of insulin: Secondary | ICD-10-CM | POA: Diagnosis not present

## 2018-08-02 DIAGNOSIS — E559 Vitamin D deficiency, unspecified: Secondary | ICD-10-CM | POA: Diagnosis not present

## 2018-08-02 NOTE — Progress Notes (Signed)
Patient was educated on the use of the Trulicity pen. Reviewed necessary supplies and operation of the pen. Also reviewed goal blood glucose levels. Patient was able to demonstrate use. All questions and concerns were addressed.  

## 2018-08-03 ENCOUNTER — Other Ambulatory Visit: Payer: Self-pay | Admitting: Family Medicine

## 2018-08-03 ENCOUNTER — Telehealth: Payer: Self-pay

## 2018-08-03 ENCOUNTER — Telehealth: Payer: Self-pay | Admitting: Pharmacist

## 2018-08-03 LAB — CMP14+EGFR
ALT: 17 IU/L (ref 0–32)
AST: 16 IU/L (ref 0–40)
Albumin/Globulin Ratio: 1.5 (ref 1.2–2.2)
Albumin: 4.2 g/dL (ref 3.8–4.8)
Alkaline Phosphatase: 95 IU/L (ref 39–117)
BUN/Creatinine Ratio: 14 (ref 12–28)
BUN: 21 mg/dL (ref 8–27)
Bilirubin Total: 0.3 mg/dL (ref 0.0–1.2)
CO2: 21 mmol/L (ref 20–29)
Calcium: 9.7 mg/dL (ref 8.7–10.3)
Chloride: 103 mmol/L (ref 96–106)
Creatinine, Ser: 1.49 mg/dL — ABNORMAL HIGH (ref 0.57–1.00)
GFR calc Af Amer: 43 mL/min/{1.73_m2} — ABNORMAL LOW (ref >59)
GFR calc non Af Amer: 37 mL/min/{1.73_m2} — ABNORMAL LOW (ref >59)
Globulin, Total: 2.8 g/dL (ref 1.5–4.5)
Glucose: 103 mg/dL — ABNORMAL HIGH (ref 65–99)
Potassium: 4.5 mmol/L (ref 3.5–5.2)
Sodium: 140 mmol/L (ref 134–144)
Total Protein: 7 g/dL (ref 6.0–8.5)

## 2018-08-03 LAB — VITAMIN D 25 HYDROXY (VIT D DEFICIENCY, FRACTURES): Vit D, 25-Hydroxy: 22.6 ng/mL — ABNORMAL LOW (ref 30.0–100.0)

## 2018-08-03 LAB — LIPID PANEL
Chol/HDL Ratio: 4.1 ratio (ref 0.0–4.4)
Cholesterol, Total: 189 mg/dL (ref 100–199)
HDL: 46 mg/dL (ref >39)
LDL Calculated: 116 mg/dL — ABNORMAL HIGH (ref 0–99)
Triglycerides: 133 mg/dL (ref 0–149)
VLDL Cholesterol Cal: 27 mg/dL (ref 5–40)

## 2018-08-03 LAB — HEMOGLOBIN A1C
Est. average glucose Bld gHb Est-mCnc: 272 mg/dL
Hgb A1c MFr Bld: 11.1 % — ABNORMAL HIGH (ref 4.8–5.6)

## 2018-08-03 MED ORDER — ERGOCALCIFEROL 1.25 MG (50000 UT) PO CAPS: 50000.0000 [IU] | ORAL_CAPSULE | ORAL | 1 refills | Status: DC

## 2018-08-03 MED FILL — VIT D2 1.25 MG (50,000 UNIT: 1.25 MG | 63 days supply | Qty: 9 | Fill #0

## 2018-08-03 NOTE — Telephone Encounter (Signed)
Call placed. Pt did not answer. Left HIPAA-compliant VM with instructions to call back and schedule a d/u appt regarding her A1c/Trulicity titration.

## 2018-08-03 NOTE — Telephone Encounter (Signed)
-----   Message from Charlott Rakes, MD sent at 08/03/2018 11:07 AM EDT ----- Cholesterol has improved, vitamin D is low and I have sent Crestor to her pharmacy.  A1c is elevated at 11.1 and I will need her to comply with the new medication I placed her on as well as a diabetic diet.

## 2018-08-03 NOTE — Telephone Encounter (Signed)
Patient was called and informed to contact office to go over lab results.

## 2018-08-06 ENCOUNTER — Telehealth: Payer: Self-pay | Admitting: Family Medicine

## 2018-08-06 ENCOUNTER — Telehealth: Payer: Self-pay | Admitting: Pharmacist

## 2018-08-06 NOTE — Telephone Encounter (Signed)
Pt called request lab results.  °

## 2018-08-06 NOTE — Telephone Encounter (Signed)
Patient called back concerning results. Name, DOB verified as well as address. Results and recommendations from Dr. Margarita Rana given. Pt is amenable to seeing me in 1 month for Trulicity titration.

## 2018-08-06 NOTE — Telephone Encounter (Signed)
Patient called returned.  Patient did not answer and no voice mail was available.  Will call at a later time.  Nurse called the patient's home phone number but received mother and message was left on with mother  for the patient to call back.  Return phone number given.        Marland Kitchen

## 2018-08-06 NOTE — Telephone Encounter (Signed)
Attempted to contact pt. Unsuccessful x3. Could not leave VM as pt's VM is not set up. Call made to her sister. Her sister will try to contact her. I left my name and number for call back.

## 2018-08-06 NOTE — Telephone Encounter (Signed)
Patient called back to check on the status of their results. Please follow up.

## 2018-08-06 NOTE — Telephone Encounter (Signed)
Adrienne [name] may you contact pt. Looks like you tried to reach out to her

## 2018-08-07 NOTE — Telephone Encounter (Signed)
[name] has reached out to pt

## 2018-08-20 MED FILL — LANTUS SOLOSTAR 100 UNITS/M: 100 | 30 days supply | Qty: 15 | Fill #2

## 2018-08-25 ENCOUNTER — Other Ambulatory Visit: Payer: Self-pay

## 2018-08-25 DIAGNOSIS — Z87891 Personal history of nicotine dependence: Secondary | ICD-10-CM | POA: Diagnosis not present

## 2018-08-25 DIAGNOSIS — R110 Nausea: Secondary | ICD-10-CM | POA: Insufficient documentation

## 2018-08-25 DIAGNOSIS — Z7982 Long term (current) use of aspirin: Secondary | ICD-10-CM | POA: Insufficient documentation

## 2018-08-25 DIAGNOSIS — I10 Essential (primary) hypertension: Secondary | ICD-10-CM | POA: Insufficient documentation

## 2018-08-25 DIAGNOSIS — Z794 Long term (current) use of insulin: Secondary | ICD-10-CM | POA: Insufficient documentation

## 2018-08-25 DIAGNOSIS — R231 Pallor: Secondary | ICD-10-CM | POA: Diagnosis not present

## 2018-08-25 DIAGNOSIS — E161 Other hypoglycemia: Secondary | ICD-10-CM | POA: Diagnosis not present

## 2018-08-25 DIAGNOSIS — Z79899 Other long term (current) drug therapy: Secondary | ICD-10-CM | POA: Diagnosis not present

## 2018-08-25 DIAGNOSIS — E162 Hypoglycemia, unspecified: Secondary | ICD-10-CM | POA: Diagnosis not present

## 2018-08-25 DIAGNOSIS — E11649 Type 2 diabetes mellitus with hypoglycemia without coma: Secondary | ICD-10-CM | POA: Insufficient documentation

## 2018-08-25 DIAGNOSIS — R5383 Other fatigue: Secondary | ICD-10-CM | POA: Insufficient documentation

## 2018-08-25 DIAGNOSIS — R000 Tachycardia, unspecified: Secondary | ICD-10-CM | POA: Diagnosis not present

## 2018-08-26 ENCOUNTER — Emergency Department (HOSPITAL_COMMUNITY)
Admission: EM | Admit: 2018-08-26 | Discharge: 2018-08-26 | Disposition: A | Discharge status: Home / Self Care | Payer: Medicare HMO | Attending: Emergency Medicine | Admitting: Emergency Medicine

## 2018-08-26 ENCOUNTER — Encounter (HOSPITAL_COMMUNITY): Payer: Self-pay | Admitting: Emergency Medicine

## 2018-08-26 DIAGNOSIS — E162 Hypoglycemia, unspecified: Secondary | ICD-10-CM

## 2018-08-26 LAB — CBC
HCT: 34 % — ABNORMAL LOW (ref 36.0–46.0)
Hemoglobin: 11.1 g/dL — ABNORMAL LOW (ref 12.0–15.0)
MCH: 32 pg (ref 26.0–34.0)
MCHC: 32.6 g/dL (ref 30.0–36.0)
MCV: 98 fL (ref 80.0–100.0)
Platelets: 162 10*3/uL (ref 150–400)
RBC: 3.47 MIL/uL — ABNORMAL LOW (ref 3.87–5.11)
RDW: 12.5 % (ref 11.5–15.5)
WBC: 7.5 10*3/uL (ref 4.0–10.5)
nRBC: 0 % (ref 0.0–0.2)

## 2018-08-26 LAB — URINALYSIS, ROUTINE W REFLEX MICROSCOPIC
Bacteria, UA: NONE SEEN
Bilirubin Urine: NEGATIVE
Glucose, UA: 50 mg/dL — AB
Hgb urine dipstick: NEGATIVE
Ketones, ur: NEGATIVE mg/dL
Leukocytes,Ua: NEGATIVE
Nitrite: NEGATIVE
Protein, ur: 30 mg/dL — AB
Specific Gravity, Urine: 1.017 (ref 1.005–1.030)
pH: 5 (ref 5.0–8.0)

## 2018-08-26 LAB — CBG MONITORING, ED
Glucose-Capillary: 180 mg/dL — ABNORMAL HIGH (ref 70–99)
Glucose-Capillary: 239 mg/dL — ABNORMAL HIGH (ref 70–99)
Glucose-Capillary: 242 mg/dL — ABNORMAL HIGH (ref 70–99)
Glucose-Capillary: 306 mg/dL — ABNORMAL HIGH (ref 70–99)

## 2018-08-26 LAB — BASIC METABOLIC PANEL
Anion gap: 17 — ABNORMAL HIGH (ref 5–15)
BUN: 29 mg/dL — ABNORMAL HIGH (ref 8–23)
CO2: 20 mmol/L — ABNORMAL LOW (ref 22–32)
Calcium: 9.4 mg/dL (ref 8.9–10.3)
Chloride: 100 mmol/L (ref 98–111)
Creatinine, Ser: 1.68 mg/dL — ABNORMAL HIGH (ref 0.44–1.00)
GFR calc Af Amer: 37 mL/min — ABNORMAL LOW (ref >60)
GFR calc non Af Amer: 32 mL/min — ABNORMAL LOW (ref >60)
Glucose, Bld: 267 mg/dL — ABNORMAL HIGH (ref 70–99)
Potassium: 4.9 mmol/L (ref 3.5–5.1)
Sodium: 137 mmol/L (ref 135–145)

## 2018-08-26 LAB — LIPASE, BLOOD: Lipase: 29 U/L (ref 11–51)

## 2018-08-26 MED ORDER — SODIUM CHLORIDE 0.9 % IV SOLN: 250.0000 mL | INTRAVENOUS | Status: DC | PRN

## 2018-08-26 MED ORDER — SODIUM CHLORIDE 0.9% FLUSH: 3.0000 mL | INTRAVENOUS | Status: DC | PRN

## 2018-08-26 MED ORDER — SODIUM CHLORIDE 0.9% FLUSH: 3.0000 mL | Freq: Two times a day (BID) | INTRAVENOUS | Status: DC

## 2018-08-26 MED ORDER — FLEET ENEMA 7-19 GM/118ML RE ENEM
1.0000 | ENEMA | Freq: Once | RECTAL | Status: DC
  Filled 2018-08-26: qty 1

## 2018-08-26 NOTE — ED Notes (Addendum)
Recollect on CBC, Lipase, and BMP sent to main lab

## 2018-08-26 NOTE — ED Provider Notes (Signed)
[facility] DEPT Provider Note  CSN: [id number] Arrival date & time: 08/25/18 2357  Chief Complaint(s) Hypoglycemia  HPI Adrienne [name] is a 63 y.o. female with h/o DM on Lantus, metformin, and recently started on Trulicity who presents for hypoglycemia (noted to be 48 by EMS). They were called out generalized fatigue and nausea over the last 1-2 days. No emesis, recent fevers, chills, CP, SOB, abd pain, diarrhea, or dysuria. No recent infections. Patient has been eating well. Medication is managed by her niece due to her being blind. They do not have a glucometer at home to track her BS levels.  Patient was given D10, oral glucose, a PBJ sandwich, and orange juice by EMS. Reports feeling better, but still nauseated.  HPI  Past Medical History Past Medical History:  Diagnosis Date   Diabetes ([place])    High cholesterol    Hypertension    Patient Active Problem List   Diagnosis Date Noted   GERD (gastroesophageal reflux disease) 01/03/2018   Blindness 10/04/2017   Vitamin D deficiency 08/24/2017   Gastroparesis 05/04/2016   Anemia 10/05/2015   Fall 09/06/2015   DKA (diabetic ketoacidoses) ([place]) 09/06/2015   Hyperkalemia 09/05/2015   Essential hypertension 12/20/2013   Diabetes mellitus ([place]) 11/19/2013   Other hyperlipidemia 11/19/2013   Central retinal artery occlusion of right eye 11/05/2013   Home Medication(s) Prior to Admission medications   Medication Sig Start Date End Date Taking? Authorizing Provider  aspirin EC 81 MG tablet Take 81 mg by mouth daily.     [provider]  atorvastatin (LIPITOR) 80 MG tablet Take 1 tablet (80 mg total) by mouth daily. 04/18/18   Charlott Rakes, MD  blood glucose meter kit and supplies Dispense based on patient and insurance preference. Use up to four times daily as directed. (FOR ICD-9 250.00, 250.01). 09/08/15   Lavina Hamman, MD  Blood Glucose Monitoring Suppl (PRODIGY VOICE  BLOOD GLUCOSE) w/Device KIT Use as directed. 05/09/17   Charlott Rakes, MD  brimonidine (ALPHAGAN) 0.15 % ophthalmic solution Place 1 drop into both eyes 2 (two) times daily.  05/20/15   [provider]  cetirizine (ZYRTEC) 10 MG tablet Take 1 tablet (10 mg total) by mouth daily. 04/18/18   Charlott Rakes, MD  clopidogrel (PLAVIX) 75 MG tablet Take 1 tablet (75 mg total) by mouth daily. 04/18/18   Charlott Rakes, MD  dorzolamide-timolol (COSOPT) 22.3-6.8 MG/ML ophthalmic solution Place 1 drop into both eyes 2 (two) times daily.  08/05/15   [provider]  ergocalciferol (DRISDOL) 1.25 MG (50000 UT) capsule Take 1 capsule (50,000 Units total) by mouth once a week. 08/03/18   Charlott Rakes, MD  famotidine (PEPCID) 20 MG tablet Take 1 tablet (20 mg total) by mouth 2 (two) times daily. 01/03/18   Charlott Rakes, MD  fluticasone (FLONASE) 50 MCG/ACT nasal spray Place 2 sprays into both nostrils daily. 10/04/17   Charlott Rakes, MD  glucose blood test strip Use as instructed up to four times daily 08/02/17   Charlott Rakes, MD  insulin aspart (NOVOLOG FLEXPEN) 100 UNIT/ML FlexPen Inject 0-12 Units into the skin 3 (three) times daily before meals. As per sliding scale Patient not taking: Reported on 05/08/2017 01/23/17   Charlott Rakes, MD  Insulin Glargine (LANTUS SOLOSTAR) 100 UNIT/ML Solostar Pen Inject 50 Units into the skin daily at 10 pm. 04/18/18   Charlott Rakes, MD  Insulin Pen Needle 31G X 5 MM MISC 1 Act by Does not apply route 4 (  four) times daily - after meals and at bedtime. 09/08/15   Lavina Hamman, MD  linaclotide Rolan Lipa) 72 MCG capsule Take 1 capsule (72 mcg total) by mouth daily before breakfast. 07/30/18   Charlott Rakes, MD  losartan-hydrochlorothiazide (HYZAAR) 100-25 MG tablet Take 1 tablet by mouth daily. 04/18/18   Charlott Rakes, MD  metFORMIN (GLUCOPHAGE) 1000 MG tablet Take 1 tablet (1,000 mg total) by mouth 2 (two) times daily. 04/18/18   Charlott Rakes, MD    metoCLOPramide (REGLAN) 10 MG tablet Take 1 tablet (10 mg total) by mouth every 8 (eight) hours as needed for nausea. 04/18/18   Charlott Rakes, MD                                                                                                                                    Past Surgical History Past Surgical History:  Procedure Laterality Date   CATARACT EXTRACTION, BILATERAL     Laser eye surgery, cataract surgery bi-lat   CESAREAN SECTION     Twins, Breech   EYE SURGERY Bilateral 2012   LASER, CATARACT   TEE WITHOUT CARDIOVERSION N/A 12/31/2013   Procedure: TRANSESOPHAGEAL ECHOCARDIOGRAM (TEE);  Surgeon: Laverda Page, MD;  [facility] ENDOSCOPY;  Service: Cardiovascular;  Laterality: N/A;  H&P in file   TUBAL LIGATION Bilateral    Family History Family History  Problem Relation Age of Onset   Dementia Mother    Diabetes Father    Hypertension Father    Hypertension Sister    Diabetes Sister    Diabetes Sister    Heart disease Sister     Social History Social History   Tobacco Use   Smoking status: Former Smoker   Smokeless tobacco: Never Used   Tobacco comment: QUIT IN 1987  Substance Use Topics   Alcohol use: No    Alcohol/week: 0.0 standard drinks    Comment: QUIT IN 1987   Drug use: No    Frequency: 2.0 times per week    Comment: QUIT 1980   Allergies Codeine, Hydrocodone, Oxycodone, Penicillins, Clindamycin/lincomycin, Niacin and related, Penicillin g, and Niacin  Review of Systems Review of Systems All other systems are reviewed and are negative for acute change except as noted in the HPI  Physical Exam Vital Signs  I have reviewed the triage vital signs BP (!) 173/91 (BP Location: Right Arm)    Pulse 89    Resp 17    Ht 5' 6"  (1.676 m)    Wt 88.5 kg    SpO2 96%    BMI 31.47 kg/m   Physical Exam Vitals signs reviewed.  Constitutional:      General: She is not in acute distress.    Appearance: She is well-developed. She is  not diaphoretic.  HENT:     Head: Normocephalic and atraumatic.     Right Ear: External ear normal.     Left Ear: External ear  normal.     Nose: Nose normal.  Eyes:     General: No scleral icterus.    Conjunctiva/sclera: Conjunctivae normal.  Neck:     Musculoskeletal: Normal range of motion.     Trachea: Phonation normal.  Cardiovascular:     Rate and Rhythm: Normal rate and regular rhythm.  Pulmonary:     Effort: Pulmonary effort is normal. No respiratory distress.     Breath sounds: No stridor.  Abdominal:     General: There is no distension.     Tenderness: There is no abdominal tenderness.  Musculoskeletal: Normal range of motion.  Neurological:     Mental Status: She is alert and oriented to person, place, and time.  Psychiatric:        Behavior: Behavior normal.     ED Results and Treatments Labs (all labs ordered are listed, but only abnormal results are displayed) Labs Reviewed  CBC - Abnormal; Notable for the following components:      Result Value   RBC 3.47 (*)    Hemoglobin 11.1 (*)    HCT 34.0 (*)    All other components within normal limits  URINALYSIS, ROUTINE W REFLEX MICROSCOPIC - Abnormal; Notable for the following components:   APPearance CLOUDY (*)    Glucose, UA 50 (*)    Protein, ur 30 (*)    All other components within normal limits  BASIC METABOLIC PANEL - Abnormal; Notable for the following components:   CO2 20 (*)    Glucose, Bld 267 (*)    BUN 29 (*)    Creatinine, Ser 1.68 (*)    GFR calc non Af Amer 32 (*)    GFR calc Af Amer 37 (*)    Anion gap 17 (*)    All other components within normal limits  CBG MONITORING, ED - Abnormal; Notable for the following components:   Glucose-Capillary 180 (*)    All other components within normal limits  CBG MONITORING, ED - Abnormal; Notable for the following components:   Glucose-Capillary 239 (*)    All other components within normal limits  CBG MONITORING, ED - Abnormal; Notable for the  following components:   Glucose-Capillary 242 (*)    All other components within normal limits  CBG MONITORING, ED - Abnormal; Notable for the following components:   Glucose-Capillary 306 (*)    All other components within normal limits  LIPASE, BLOOD                                                                                                                         EKG  EKG Interpretation  Date/Time:    Ventricular Rate:    PR Interval:    QRS Duration:   QT Interval:    QTC Calculation:   R Axis:     Text Interpretation:        Radiology No results found.  Pertinent labs & imaging results that were available during my care of the  patient were reviewed by me and considered in my medical decision making (see chart for details).  Medications Ordered in ED Medications  sodium chloride flush (NS) 0.9 % injection 3 mL (has no administration in time range)  sodium chloride flush (NS) 0.9 % injection 3 mL (has no administration in time range)  0.9 %  sodium chloride infusion (has no administration in time range)  sodium phosphate (FLEET) 7-19 GM/118ML enema 1 enema (0 enemas Rectal Hold 08/26/18 0151)                                                                                                                                    Procedures Procedures  (including critical care time)  Medical Decision Making / ED Course I have reviewed the nursing notes for this encounter and the patient's prior records (if available in EHR or on provided paperwork).   Tula [name] was evaluated in Emergency Department on 08/26/2018 for the symptoms described in the history of present illness. She was evaluated in the context of the global COVID-19 pandemic, which necessitated consideration that the patient might be at risk for infection with the SARS-CoV-2 virus that causes COVID-19. Institutional protocols and algorithms that pertain to the evaluation of patients at risk for COVID-19 are in  a state of rapid change based on information released by regulatory bodies including the CDC and federal and state organizations. These policies and algorithms were followed during the patient's care in the ED.  Hypoglycemia responded to initial treatment from EMS. No need for additional glucose. After 3 hrs of monitoring, BS were uptrending, last 306 without additional intervention. Patient already has appt with PCP in tomorrow.  The patient appears reasonably screened and/or stabilized for discharge and I doubt any other medical condition or other [facility] requiring further screening, evaluation, or treatment in the ED at this time prior to discharge.  The patient is safe for discharge with strict return precautions.       Final Clinical Impression(s) / ED Diagnoses Final diagnoses:  Hypoglycemia     The patient appears reasonably screened and/or stabilized for discharge and I doubt any other medical condition or other [facility] requiring further screening, evaluation, or treatment in the ED at this time prior to discharge.  Disposition: Discharge  Condition: Good  I have discussed the results, Dx and Tx plan with the patient and daughter who expressed understanding and agree(s) with the plan. Discharge instructions discussed at great length. The patient and daughter was given strict return precautions who verbalized understanding of the instructions. No further questions at time of discharge.    ED Discharge Orders    None       Follow Up: Charlott [place]akes[place]  [id number] [phone]  On 08/27/2018 as scheduled to discuss DM medication and need for glucometer at home.     This chart was dictated using voice recognition software.  Despite  best efforts to proofread,  errors can occur which can change the documentation meaning.   Fatima Blank, MD 08/26/18 [phone]

## 2018-08-26 NOTE — ED Triage Notes (Signed)
Pt comes to ed via ems, c/o hypoglycemia  Ems called out cbg 47 on seen. Ems gave D10 25 grams, 15 grams oral glucose, half peanut and jelly sand witch, and oj.  V/s on arrive cbg 189 @2344 , pressure 160/80,  Hr about 86, spo2 98 room air. Pt was cold and clammy. Pt on new med, 2 weeks ago trulicity. Complaining of cramps.  20 LFA,    Pt is blind at base line.  Still complains of general weakness and nausea with food

## 2018-08-27 ENCOUNTER — Other Ambulatory Visit: Payer: Self-pay

## 2018-08-27 ENCOUNTER — Ambulatory Visit: Payer: Medicare HMO | Attending: Family Medicine | Admitting: Pharmacist

## 2018-08-27 ENCOUNTER — Encounter: Payer: Self-pay | Admitting: Pharmacist

## 2018-08-27 DIAGNOSIS — E1169 Type 2 diabetes mellitus with other specified complication: Secondary | ICD-10-CM | POA: Diagnosis not present

## 2018-08-27 DIAGNOSIS — Z794 Long term (current) use of insulin: Secondary | ICD-10-CM

## 2018-08-27 LAB — GLUCOSE, POCT (MANUAL RESULT ENTRY): POC Glucose: 155 mg/dl — AB (ref 70–99)

## 2018-08-27 MED ORDER — FREESTYLE LIBRE 14 DAY SENSOR MISC: 1.0000 | 2 refills | Status: DC

## 2018-08-27 MED ORDER — FREESTYLE LIBRE 14 DAY READER DEVI: 1.0000 | 0 refills | Status: DC

## 2018-08-27 NOTE — Progress Notes (Signed)
S:    PCP: Dr. Margarita Rana  No chief complaint on file.  Patient arrives in good spirits, accompanied by her niece. Presents for diabetes f/u. Patient was referred and last seen by Primary Care Provider on 07/30/18.   HPI:  ED visit 08/26/18: Patient reports hypoglycemic events. Presented to ED yesterday evening at 11:57PM via EMS with hypoglycemia (initial reading 47). Patient was given oral glucose, D10, PBJ sandwich, and OJ by EMS. BG levels were up-trending after 4 hours of monitoring in the ED with pre-discharge level of 306. Patient reports that she ate a regular supper before the event and took all medications as prescribed. Denies previous episodes since last encounter with me. She reports that she has been eating well and not skipping meals. The patient is blind but manages her home injections.   Family/Social History:  - FHx: DM (father, sister x2), HTN (father, sister), heart disease (disease) - Tobacco: former smoker (quit in 35) - Alcohol: denies use   Insurance coverage/medication affordability:  - [employer]  Patient reports adherence with medications.  Current diabetes medications include:  - Lantus 50 units daily (demonstrates home technique - indicated that she takes 46 units. Takes 50 units when her niece is there to help her twice a week) - Metformin 4081 mg BID - Trulicity 4.48 mg weekly  Patient reported dietary habits: - Pt reports adherence to a diabetic diet - Does admit to occasional heavy-carb choices and sweets  Patient-reported exercise habits:  - Limited as patient is blind   O:  POCT: 155 (post-prandial; ate a slice of sweet potato pie last night)  Lab Results  Component Value Date   HGBA1C 11.1 (H) 08/02/2018   There were no vitals filed for this visit.  Lipid Panel     Component Value Date/Time   CHOL 189 08/02/2018 1353   TRIG 133 08/02/2018 1353   HDL 46 08/02/2018 1353   CHOLHDL 4.1 08/02/2018 1353   CHOLHDL 2.7 01/04/2016 1138   VLDL 12  01/04/2016 1138   LDLCALC 116 (H) 08/02/2018 1353   Home CBGs: not testing  Clinical ASCVD: No  The 10-year ASCVD risk score Mikey Bussing DC Jr., et al., 2013) is: 33.7%   Values used to calculate the score:     Age: 63 years     Sex: Female     Is Non-Hispanic African American: Yes     Diabetic: Yes     Tobacco smoker: No     Systolic Blood Pressure: 185 mmHg     Is BP treated: Yes     HDL Cholesterol: 46 mg/dL     Total Cholesterol: 189 mg/dL   A/P: Diabetes longstanding currently uncontrolled based on her A1c. Degree of control at home unknown; pt with recent hypoglycemia. Patient is able to verbalize appropriate hypoglycemia management plan. Patient is adherent with medication.   Will advise for patient to decrease insulin to 40 units daily. She takes 46 units of Lantus on most days currently. She is to continue Trulicity and metformin. Patient educated that her sugars may increase, but we can manage/titrate medications accordingly. Her priority is to avoid another hypoglycemic episode. I gave her my office extension to call if she feels symptoms of hypoglycemia before her PCP visit in a month.   -Decreased dose of Lantus to 40 units daily.  -Continued Trulicity, metformin at current doses. -Extensively discussed pathophysiology of DM, recommended lifestyle interventions, dietary effects on glycemic control -Counseled on s/sx of and management of hypoglycemia -Next  A1C anticipated 10/20.   Written patient instructions provided.  Total time in face to face counseling 15 minutes.   Follow up PCP Clinic Visit 09/18/18.   Benard Halsted, PharmD, [place] [phone]

## 2018-08-30 ENCOUNTER — Encounter: Payer: Self-pay | Admitting: Family Medicine

## 2018-09-04 MED FILL — TRULICITY 0.75 MG/0.5 ML PE: 0.75 | 28 days supply | Qty: 2 | Fill #1

## 2018-09-18 ENCOUNTER — Encounter: Payer: Self-pay | Admitting: Family Medicine

## 2018-09-18 ENCOUNTER — Ambulatory Visit: Payer: Medicare HMO | Attending: Family Medicine | Admitting: Family Medicine

## 2018-09-18 ENCOUNTER — Other Ambulatory Visit: Payer: Self-pay

## 2018-09-18 VITALS — BP 135/80 | HR 95 | Temp 98.2°F | Ht 66.0 in | Wt 199.4 lb

## 2018-09-18 DIAGNOSIS — E113493 Type 2 diabetes mellitus with severe nonproliferative diabetic retinopathy without macular edema, bilateral: Secondary | ICD-10-CM

## 2018-09-18 DIAGNOSIS — E7849 Other hyperlipidemia: Secondary | ICD-10-CM

## 2018-09-18 DIAGNOSIS — E113492 Type 2 diabetes mellitus with severe nonproliferative diabetic retinopathy without macular edema, left eye: Secondary | ICD-10-CM | POA: Diagnosis not present

## 2018-09-18 DIAGNOSIS — Z794 Long term (current) use of insulin: Secondary | ICD-10-CM | POA: Diagnosis not present

## 2018-09-18 DIAGNOSIS — I10 Essential (primary) hypertension: Secondary | ICD-10-CM

## 2018-09-18 LAB — GLUCOSE, POCT (MANUAL RESULT ENTRY): POC Glucose: 185 mg/dl — AB (ref 70–99)

## 2018-09-18 NOTE — Progress Notes (Signed)
Subjective:  Patient ID: Adrienne [name], female    DOB: [birth year]  Age: 63 y.o. 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 She had an ED visit last month for hypoglycemia after she had been commenced on Trulicity.  At her follow-up visit with the clinical pharmacist her Lantus was decreased to 40 units.  On review of her blood sugar readings fastings have ranged between 140 and 190 and random sugars mostly in the low 200s.  Of note she has had a single hypoglycemic value of 40 which was fasting and a single elevation of 392.  She endorses waking up late and does not get to eat her first meal of the day until about 12 noon. She is having difficulty obtaining Trulicity as she is currently in the donut hole and this would cost her about $200. She has no additional concerns today.  Past Medical History:  Diagnosis Date  . Diabetes ([place])   . High cholesterol   . Hypertension     Past Surgical History:  Procedure Laterality Date  . CATARACT EXTRACTION, BILATERAL     Laser eye surgery, cataract surgery bi-lat  . CESAREAN SECTION     Twins, Breech  . EYE SURGERY Bilateral 2012   LASER, CATARACT  . TEE WITHOUT CARDIOVERSION N/A 12/31/2013   Procedure: TRANSESOPHAGEAL ECHOCARDIOGRAM (TEE);  Surgeon: Laverda Page, MD;  Location: [facility];  Service: Cardiovascular;  Laterality: N/A;  H&P in file  . TUBAL LIGATION Bilateral     Family History  Problem Relation Age of Onset  . Dementia Mother   . Diabetes Father   . Hypertension Father   . Hypertension Sister   . Diabetes Sister   . Diabetes Sister    . Heart disease Sister     Allergies  Allergen Reactions  . Codeine Itching  . Hydrocodone Itching  . Oxycodone Itching  . Penicillins Itching  . Clindamycin/Lincomycin Itching  . Niacin And Related Itching  . Penicillin G Itching  . Niacin Itching and Anxiety    Facial flushing    Outpatient Medications Prior to Visit  Medication Sig Dispense Refill  . aspirin EC 81 MG tablet Take 81 mg by mouth daily.     Marland Kitchen atorvastatin (LIPITOR) 80 MG tablet Take 1 tablet (80 mg total) by mouth daily. 90 tablet 1  . blood glucose meter kit and supplies Dispense based on patient and insurance preference. Use up to four times daily as directed. (FOR ICD-9 250.00, 250.01). 1 each 0  . Blood Glucose Monitoring Suppl (PRODIGY VOICE BLOOD GLUCOSE) w/Device KIT Use as directed. 1 kit 0  . cetirizine (ZYRTEC) 10 MG tablet Take 1 tablet (10 mg total) by mouth daily. 30 tablet 3  . clopidogrel (PLAVIX) 75 MG tablet Take 1 tablet (75 mg total) by mouth daily. 90 tablet 1  . Continuous Blood Gluc Receiver (FREESTYLE LIBRE 14 DAY READER) DEVI 1 each by Does not apply route as directed. 1 Device 0  . Continuous Blood Gluc Sensor (FREESTYLE LIBRE 14 DAY SENSOR) MISC 1 each by Does not apply route every 14 (fourteen) days. Place one sensor and use to check blood sugar for 14  days then remove and replace. 2 each 2  . ergocalciferol (DRISDOL) 1.25 MG (50000 UT) capsule Take 1 capsule (50,000 Units total) by mouth once a week. 9 capsule 1  . famotidine (PEPCID) 20 MG tablet Take 1 tablet (20 mg total) by mouth 2 (two) times daily. 180 tablet 1  . fluticasone (FLONASE) 50 MCG/ACT nasal spray Place 2 sprays into both nostrils daily. 16 g 6  . glucose blood test strip Use as instructed up to four times daily 100 each 2  . Insulin Glargine (LANTUS SOLOSTAR) 100 UNIT/ML Solostar Pen Inject 50 Units into the skin daily at 10 pm. 5 pen 3  . Insulin Pen Needle 31G X 5 MM MISC 1 Act by Does not apply route 4 (four) times  daily - after meals and at bedtime. 300 each 0  . losartan-hydrochlorothiazide (HYZAAR) 100-25 MG tablet Take 1 tablet by mouth daily. 90 tablet 1  . metFORMIN (GLUCOPHAGE) 1000 MG tablet Take 1 tablet (1,000 mg total) by mouth 2 (two) times daily. 180 tablet 1  . metoCLOPramide (REGLAN) 10 MG tablet Take 1 tablet (10 mg total) by mouth every 8 (eight) hours as needed for nausea. 90 tablet 1  . TRULICITY 0.75 MG/0.5ML SOPN Inject 0.75 mg into the skin once a week.    . brimonidine (ALPHAGAN) 0.15 % ophthalmic solution Place 1 drop into both eyes 2 (two) times daily.     . dorzolamide-timolol (COSOPT) 22.3-6.8 MG/ML ophthalmic solution Place 1 drop into both eyes 2 (two) times daily.   2  . linaclotide (LINZESS) 72 MCG capsule Take 1 capsule (72 mcg total) by mouth daily before breakfast. (Patient not taking: Reported on 09/18/2018) 30 capsule 3   No facility-administered medications prior to visit.      ROS Review of Systems  Constitutional: Negative for activity change, appetite change and fatigue.  HENT: Negative for congestion, sinus pressure and sore throat.   Eyes: Positive for visual disturbance.  Respiratory: Negative for cough, chest tightness, shortness of breath and wheezing.   Cardiovascular: Negative for chest pain and palpitations.  Gastrointestinal: Negative for abdominal distention, abdominal pain and constipation.  Endocrine: Negative for polydipsia.  Genitourinary: Negative for dysuria and frequency.  Musculoskeletal: Negative for arthralgias and back pain.  Skin: Negative for rash.  Neurological: Negative for tremors, light-headedness and numbness.  Hematological: Does not bruise/bleed easily.  Psychiatric/Behavioral: Negative for agitation and behavioral problems.    Objective:  BP 135/80   Pulse 95   Temp 98.2 F (36.8 C) (Oral)   Ht 5' 6" (1.676 m)   Wt 199 lb 6.4 oz (90.4 kg)   SpO2 99%   BMI 32.18 kg/m   BP/Weight 09/18/2018 08/26/2018 01/03/2018  Systolic  BP 135 166 136  Diastolic BP 80 95 80  Wt. (Lbs) 199.4 195 193  BMI 32.18 31.47 31.15      Physical Exam Constitutional:      Appearance: She is well-developed.  Cardiovascular:     Rate and Rhythm: Normal rate.     Heart sounds: Normal heart sounds. No murmur.  Pulmonary:     Effort: Pulmonary effort is normal.     Breath sounds: Normal breath sounds. No wheezing or rales.  Chest:     Chest wall: No tenderness.  Abdominal:     General: Bowel sounds are normal. There is no distension.     Palpations: Abdomen is soft. There is no mass.     Tenderness: There is no abdominal tenderness.  Musculoskeletal:   Normal range of motion.  Neurological:     Mental Status: She is alert and oriented to person, place, and time.     CMP Latest Ref Rng & Units 08/26/2018 08/02/2018 10/04/2017  Glucose 70 - 99 mg/dL 267(H) 103(H) 282(H)  BUN 8 - 23 mg/dL 29(H) 21 21  Creatinine 0.44 - 1.00 mg/dL 1.68(H) 1.49(H) 1.34(H)  Sodium 135 - 145 mmol/L 137 140 138  Potassium 3.5 - 5.1 mmol/L 4.9 4.5 5.1  Chloride 98 - 111 mmol/L 100 103 99  CO2 22 - 32 mmol/L 20(L) 21 22  Calcium 8.9 - 10.3 mg/dL 9.4 9.7 9.6  Total Protein 6.0 - 8.5 g/dL - 7.0 -  Total Bilirubin 0.0 - 1.2 mg/dL - 0.3 -  Alkaline Phos 39 - 117 IU/L - 95 -  AST 0 - 40 IU/L - 16 -  ALT 0 - 32 IU/L - 17 -    Lipid Panel     Component Value Date/Time   CHOL 189 08/02/2018 1353   TRIG 133 08/02/2018 1353   HDL 46 08/02/2018 1353   CHOLHDL 4.1 08/02/2018 1353   CHOLHDL 2.7 01/04/2016 1138   VLDL 12 01/04/2016 1138   LDLCALC 116 (H) 08/02/2018 1353    CBC    Component Value Date/Time   WBC 7.5 08/26/2018 0048   RBC 3.47 (L) 08/26/2018 0048   HGB 11.1 (L) 08/26/2018 0048   HCT 34.0 (L) 08/26/2018 0048   PLT 162 08/26/2018 0048   MCV 98.0 08/26/2018 0048   MCH 32.0 08/26/2018 0048   MCHC 32.6 08/26/2018 0048   RDW 12.5 08/26/2018 0048   LYMPHSABS 2,201 10/05/2015 1705   MONOABS 568 10/05/2015 1705   EOSABS 284 10/05/2015  1705   BASOSABS 71 10/05/2015 1705    Lab Results  Component Value Date   HGBA1C 11.1 (H) 08/02/2018    Assessment & Plan:   1. 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, with long-term current use of insulin, macular edema presence unspecified ([place]) Uncontrolled with A1c of 11.1 Blood sugar readings reveal improvement so we will make no regimen changes I have discussed with her and have also have the clinical pharmacist discussed with her the possible options due to difficulty affording Trulicity given she is in the donut hole An option is to increase her dose of Lantus which she is not open to She promises to call her insurance company and update [place] with regards to her findings Encouraged to eat a bit earlier in the day Diabetic diet, lifestyle modifications - POCT glucose (manual entry)  2. Essential hypertension Controlled Continue antihypertensives Counseled on blood pressure goal of less than 130/80, low-sodium, DASH diet, medication compliance, 150 minutes of moderate intensity exercise per week. Discussed medication compliance, adverse effects.   3. Other hyperlipidemia LDL is slightly above goal of less than 100 Continue lifestyle modifications, low-cholesterol diet Repeat lipid panel at next visit   Health Care Maintenance: Discussed the flu shot and she is not open to receiving it today No orders of the defined types were placed in this encounter.   Follow-up: Return in about 3 months (around 12/18/2018) for medical problems.       Charlott Rakes, MD, FAAFP. [facility] and [place], [place]   09/18/2018, 5:37 PM

## 2018-09-18 NOTE — Patient Instructions (Signed)

## 2018-10-03 MED FILL — TRULICITY 0.75 MG/0.5 ML PE: 0.75 | 28 days supply | Qty: 2 | Fill #2

## 2018-10-04 MED FILL — ATORVASTATIN 80 MG TABLET: 80 | 90 days supply | Qty: 90 | Fill #1

## 2018-10-04 MED FILL — CLOPIDOGREL 75 MG TABLET: 75 | 90 days supply | Qty: 90 | Fill #1

## 2018-10-04 MED FILL — LOSARTAN-HCTZ 100-25 MG TAB: 100-25 | 90 days supply | Qty: 90 | Fill #1

## 2018-10-04 MED FILL — VIT D2 1.25 MG (50,000 UNIT: 1.25 MG | 63 days supply | Qty: 9 | Fill #1

## 2018-10-04 MED FILL — metFORMIN HCL 1000 MG TABS: 1000 | 90 days supply | Qty: 180 | Fill #0

## 2018-10-16 MED FILL — LANTUS SOLOSTAR 100 UNITS/M: 100 | 30 days supply | Qty: 15 | Fill #3

## 2018-10-29 MED FILL — LINZESS 72 MCG CAPSULE: 72 | 15 days supply | Qty: 15 | Fill #0

## 2018-10-31 ENCOUNTER — Other Ambulatory Visit: Payer: Self-pay

## 2018-10-31 ENCOUNTER — Ambulatory Visit: Payer: Medicare HMO | Attending: Family Medicine | Admitting: Pharmacist

## 2018-10-31 DIAGNOSIS — E1169 Type 2 diabetes mellitus with other specified complication: Secondary | ICD-10-CM | POA: Diagnosis not present

## 2018-10-31 DIAGNOSIS — Z794 Long term (current) use of insulin: Secondary | ICD-10-CM

## 2018-10-31 MED ORDER — LANTUS SOLOSTAR 100 UNIT/ML ~~LOC~~ SOPN: 44.0000 [IU] | PEN_INJECTOR | Freq: Every day | SUBCUTANEOUS | 3 refills | Status: DC

## 2018-10-31 NOTE — Progress Notes (Signed)
     S:    PCP: Dr. Margarita Rana  No chief complaint on file.  Patient arrives in good spirits, accompanied by her niece. Presents for diabetes f/u. Patient was referred and last seen by Primary Care Provider on 07/30/18. She is presenting today with concerns over her Trulicity. She reports constipation and bloating that presented shortly after starting Trulicty. She was able to manage this with Doculax but this has recently worsened. She is starting Linzess and is picking this up after our visit today.   Family/Social History:  - FHx: DM (father, sister x2), HTN (father, sister), heart disease (disease) - Tobacco: former smoker (quit in 71) - Alcohol: denies use   Insurance coverage/medication affordability:  - [employer]  Patient reports adherence with medications.  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  Lipid Panel     Component Value Date/Time   CHOL 189 08/02/2018 1353   TRIG 133 08/02/2018 1353   HDL 46 08/02/2018 1353   CHOLHDL 4.1 08/02/2018 1353   CHOLHDL 2.7 01/04/2016 1138   VLDL 12 01/04/2016 1138   LDLCALC 116 (H) 08/02/2018 1353   Home CBGs:  14-day range from freestyle libre: 50 - 154   Clinical ASCVD: No  The 10-year ASCVD risk score Mikey Bussing DC Jr., et al., 2013) is: 22%   Values used to calculate the score:     Age: 63 years     Sex: Female     Is Non-Hispanic African American: Yes     Diabetic: Yes     Tobacco smoker: No     Systolic Blood Pressure: [id number] mmHg     Is BP treated: Yes     HDL Cholesterol: 46 mg/dL     Total Cholesterol: 189 mg/dL   A/P: Diabetes  longstanding currently uncontrolled based on her A1c. Pt with recent hypoglycemia and intolerance to Trulicity. Patient is able to verbalize appropriate hypoglycemia management plan. Patient is adherent with medication.   I have instructed her to stop Trulicity and monitor for resolution of constipation. Per pt, she has gone almost a week without a complete bowel movement. She will pick-up and start Linzess. I advised her that if symptoms worsen or she goes another 24-48 hours without a bowel movement, she should seek Urgent Care.   I have advised her to increase Lantus to 44 units next week if her blood sugars increase after stopping Trulicity. She and her niece verbalize understanding.    -Stop Trulicity. -Increase Lantus to 44 units daily.  -Continued metformin at current dose -Extensively discussed pathophysiology of DM, recommended lifestyle interventions, dietary effects on glycemic control -Counseled on s/sx of and management of hypoglycemia -Next A1C anticipated 10/20.   Written patient instructions provided.  Total time in face to face counseling 15 minutes.   Follow up PCP Clinic Visit 09/18/18.   Benard Halsted, PharmD, [place] [phone]

## 2018-11-12 ENCOUNTER — Telehealth: Payer: Self-pay | Admitting: Family Medicine

## 2018-11-12 NOTE — Telephone Encounter (Signed)
Pt states she has cut back on her Lantus from 44 units to 40 bc this was causing her to have low blood sugar levels, attempted to call pt back to transfer call with clinical pharmacist. Phone rang but No anwer

## 2018-11-19 ENCOUNTER — Other Ambulatory Visit: Payer: Self-pay | Admitting: Family Medicine

## 2018-11-19 DIAGNOSIS — E1169 Type 2 diabetes mellitus with other specified complication: Secondary | ICD-10-CM

## 2018-11-19 MED FILL — LANTUS SOLOSTAR 100 UNITS/M: 100 | 29 days supply | Qty: 12 | Fill #0

## 2018-12-18 ENCOUNTER — Ambulatory Visit: Payer: Medicare HMO | Attending: Family Medicine | Admitting: Family Medicine

## 2018-12-18 ENCOUNTER — Other Ambulatory Visit: Payer: Self-pay

## 2018-12-18 ENCOUNTER — Encounter: Payer: Self-pay | Admitting: Family Medicine

## 2018-12-18 VITALS — BP 162/84 | HR 91 | Temp 98.5°F | Ht 66.0 in | Wt 195.0 lb

## 2018-12-18 DIAGNOSIS — E7849 Other hyperlipidemia: Secondary | ICD-10-CM

## 2018-12-18 DIAGNOSIS — E1165 Type 2 diabetes mellitus with hyperglycemia: Secondary | ICD-10-CM | POA: Diagnosis not present

## 2018-12-18 DIAGNOSIS — E559 Vitamin D deficiency, unspecified: Secondary | ICD-10-CM

## 2018-12-18 DIAGNOSIS — I10 Essential (primary) hypertension: Secondary | ICD-10-CM | POA: Diagnosis not present

## 2018-12-18 DIAGNOSIS — E113493 Type 2 diabetes mellitus with severe nonproliferative diabetic retinopathy without macular edema, bilateral: Secondary | ICD-10-CM

## 2018-12-18 DIAGNOSIS — Z794 Long term (current) use of insulin: Secondary | ICD-10-CM

## 2018-12-18 DIAGNOSIS — Z1211 Encounter for screening for malignant neoplasm of colon: Secondary | ICD-10-CM

## 2018-12-18 DIAGNOSIS — E1169 Type 2 diabetes mellitus with other specified complication: Secondary | ICD-10-CM

## 2018-12-18 LAB — POCT GLYCOSYLATED HEMOGLOBIN (HGB A1C): HbA1c, POC (controlled diabetic range): 9.1 % — AB (ref 0.0–7.0)

## 2018-12-18 LAB — GLUCOSE, POCT (MANUAL RESULT ENTRY): POC Glucose: 198 mg/dl — AB (ref 70–99)

## 2018-12-18 MED ORDER — METFORMIN HCL 1000 MG PO TABS: 1000.0000 mg | ORAL_TABLET | Freq: Two times a day (BID) | ORAL | 1 refills | Status: DC

## 2018-12-18 MED ORDER — ATORVASTATIN CALCIUM 80 MG PO TABS: 80.0000 mg | ORAL_TABLET | Freq: Every day | ORAL | 1 refills | Status: DC

## 2018-12-18 MED ORDER — LOSARTAN POTASSIUM-HCTZ 100-25 MG PO TABS: 1.0000 | ORAL_TABLET | Freq: Every day | ORAL | 1 refills | Status: DC

## 2018-12-18 MED ORDER — MISC. DEVICES MISC: 0 refills | Status: AC

## 2018-12-18 MED ORDER — LANTUS SOLOSTAR 100 UNIT/ML ~~LOC~~ SOPN: 22.0000 [IU] | PEN_INJECTOR | Freq: Two times a day (BID) | SUBCUTANEOUS | 3 refills | Status: DC

## 2018-12-18 MED ORDER — CLOPIDOGREL BISULFATE 75 MG PO TABS: 75.0000 mg | ORAL_TABLET | Freq: Every day | ORAL | 1 refills | Status: DC

## 2018-12-18 MED FILL — LANTUS SOLOSTAR 100 UNITS/M: 100 | 27 days supply | Qty: 12 | Fill #0

## 2018-12-18 NOTE — Patient Instructions (Signed)

## 2018-12-18 NOTE — Progress Notes (Signed)
Subjective:  Patient ID: Adrienne [name], female    DOB: Mar 21, 1955  Age: 63 y.o. 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  Her CGM has not been working over the last 2 weeks but prior to this random sugars have been in the 200s.  She is due to change to sensor tomorrow.  She was previously on Trulicity but was unable to obtain it due to being in the donut hole but then she also informed me "it messed her up" as it caused GI symptoms. She has been compliant with prescribed Lantus dose and has changed her insurance to that, generally next year she will no longer remain in the donut hole. Her blood pressure is elevated and she endorses compliance with her antihypertensive but endorses eating high sodium foods lately.  She is no longer followed by ophthalmology at [facility] as she was informed there was nothing that could be done for her. She has no additional concerns today.  Past Medical History:  Diagnosis Date  . Diabetes ([place])   . High cholesterol   . Hypertension     Past Surgical History:  Procedure Laterality Date  . CATARACT EXTRACTION, BILATERAL     Laser eye surgery, cataract surgery bi-lat  . CESAREAN SECTION     Twins, Breech  . EYE SURGERY Bilateral 2012   LASER, CATARACT  . TEE WITHOUT CARDIOVERSION N/A 12/31/2013   Procedure: TRANSESOPHAGEAL ECHOCARDIOGRAM (TEE);  Surgeon: Laverda Page, MD;  Location: [facility];  Service: Cardiovascular;  Laterality: N/A;  H&P in file  . TUBAL LIGATION Bilateral     Family History  Problem Relation Age of Onset  . Dementia Mother   . Diabetes  Father   . Hypertension Father   . Hypertension Sister   . Diabetes Sister   . Diabetes Sister   . Heart disease Sister     Allergies  Allergen Reactions  . Codeine Itching  . Hydrocodone Itching  . Oxycodone Itching  . Penicillins Itching  . Clindamycin/Lincomycin Itching  . Niacin And Related Itching  . Penicillin G Itching  . Niacin Itching and Anxiety    Facial flushing    Outpatient Medications Prior to Visit  Medication Sig Dispense Refill  . aspirin EC 81 MG tablet Take 81 mg by mouth daily.     Marland Kitchen atorvastatin (LIPITOR) 80 MG tablet Take 1 tablet (80 mg total) by mouth daily. 90 tablet 1  . blood glucose meter kit and supplies Dispense based on patient and insurance preference. Use up to four times daily as directed. (FOR ICD-9 250.00, 250.01). 1 each 0  . Blood Glucose Monitoring Suppl (PRODIGY VOICE BLOOD GLUCOSE) w/Device KIT Use as directed. 1 kit 0  . clopidogrel (PLAVIX) 75 MG tablet Take 1 tablet (75 mg total) by mouth daily. 90 tablet 1  . Continuous Blood Gluc Receiver (FREESTYLE LIBRE 14 DAY READER) DEVI 1 each by Does not apply route as directed. 1 Device 0  . Continuous Blood Gluc Sensor (FREESTYLE LIBRE 14 DAY SENSOR) MISC 1 each by Does not apply route every 14 (fourteen) days. Place one sensor and use to check blood sugar for 14 days  then remove and replace. 2 each 2  . ergocalciferol (DRISDOL) 1.25 MG (50000 UT) capsule Take 1 capsule (50,000 Units total) by mouth once a week. 9 capsule 1  . famotidine (PEPCID) 20 MG tablet Take 1 tablet (20 mg total) by mouth 2 (two) times daily. 180 tablet 1  . fluticasone (FLONASE) 50 MCG/ACT nasal spray Place 2 sprays into both nostrils daily. 16 g 6  . Insulin Glargine (LANTUS SOLOSTAR) 100 UNIT/ML Solostar Pen Inject 40 Units into the skin at bedtime. 15 mL 2  . Insulin Pen Needle 31G X 5 MM MISC 1 Act by Does not apply route 4 (four) times daily - after meals and at bedtime. 300 each 0  . losartan-hydrochlorothiazide  (HYZAAR) 100-25 MG tablet Take 1 tablet by mouth daily. 90 tablet 1  . metFORMIN (GLUCOPHAGE) 1000 MG tablet Take 1 tablet (1,000 mg total) by mouth 2 (two) times daily. 180 tablet 1  . brimonidine (ALPHAGAN) 0.15 % ophthalmic solution Place 1 drop into both eyes 2 (two) times daily.     . cetirizine (ZYRTEC) 10 MG tablet Take 1 tablet (10 mg total) by mouth daily. (Patient not taking: Reported on 12/18/2018) 30 tablet 3  . dorzolamide-timolol (COSOPT) 22.3-6.8 MG/ML ophthalmic solution Place 1 drop into both eyes 2 (two) times daily.   2  . glucose blood test strip Use as instructed up to four times daily (Patient not taking: Reported on 12/18/2018) 100 each 2  . linaclotide (LINZESS) 72 MCG capsule Take 1 capsule (72 mcg total) by mouth daily before breakfast. (Patient not taking: Reported on 09/18/2018) 30 capsule 3  . metoCLOPramide (REGLAN) 10 MG tablet Take 1 tablet (10 mg total) by mouth every 8 (eight) hours as needed for nausea. (Patient not taking: Reported on 12/18/2018) 90 tablet 1   No facility-administered medications prior to visit.      ROS Review of Systems  Constitutional: Negative for activity change, appetite change and fatigue.  HENT: Negative for congestion, sinus pressure and sore throat.   Eyes: Positive for visual disturbance.  Respiratory: Negative for cough, chest tightness, shortness of breath and wheezing.   Cardiovascular: Negative for chest pain and palpitations.  Gastrointestinal: Negative for abdominal distention, abdominal pain and constipation.  Endocrine: Negative for polydipsia.  Genitourinary: Negative for dysuria and frequency.  Musculoskeletal: Negative for arthralgias and back pain.  Skin: Negative for rash.  Neurological: Negative for tremors, light-headedness and numbness.  Hematological: Does not bruise/bleed easily.  Psychiatric/Behavioral: Negative for agitation and behavioral problems.    Objective:  BP (!) 162/84   Pulse 91   Temp 98.5 F  (36.9 C) (Oral)   Ht _0  (1.676 m)   Wt 195 lb (88.5 kg)   SpO2 97%   BMI 31.47 kg/m   BP/Weight 12/18/2018 06/24/1243 8/0/9983  Systolic BP 382 505 397  Diastolic BP 84 80 95  Wt. (Lbs) 195 199.4 195  BMI 31.47 32.18 31.47      Physical Exam Constitutional:      Appearance: She is well-developed.  Neck:     Vascular: No JVD.  Cardiovascular:     Rate and Rhythm: Normal rate.     Heart sounds: Normal heart sounds. No murmur.  Pulmonary:     Effort: Pulmonary effort is normal.     Breath sounds: Normal breath sounds. No wheezing or rales.  Chest:     Chest wall: No tenderness.  Abdominal:     General: Bowel sounds are normal. There is no  distension.     Palpations: Abdomen is soft. There is no mass.     Tenderness: There is no abdominal tenderness.  Musculoskeletal: Normal range of motion.     Right lower leg: No edema.     Left lower leg: No edema.  Neurological:     Mental Status: She is alert and oriented to person, place, and time.  Psychiatric:        Mood and Affect: Mood normal.     CMP Latest Ref Rng & Units 08/26/2018 08/02/2018 10/04/2017  Glucose 70 - 99 mg/dL 267(H) 103(H) 282(H)  BUN 8 - 23 mg/dL 29(H) 21 21  Creatinine 0.44 - 1.00 mg/dL 1.68(H) 1.49(H) 1.34(H)  Sodium 135 - 145 mmol/L 137 140 138  Potassium 3.5 - 5.1 mmol/L 4.9 4.5 5.1  Chloride 98 - 111 mmol/L 100 103 99  CO2 22 - 32 mmol/L 20(L) 21 22  Calcium 8.9 - 10.3 mg/dL 9.4 9.7 9.6  Total Protein 6.0 - 8.5 g/dL - 7.0 -  Total Bilirubin 0.0 - 1.2 mg/dL - 0.3 -  Alkaline Phos 39 - 117 IU/L - 95 -  AST 0 - 40 IU/L - 16 -  ALT 0 - 32 IU/L - 17 -    Lipid Panel     Component Value Date/Time   CHOL 189 08/02/2018 1353   TRIG 133 08/02/2018 1353   HDL 46 08/02/2018 1353   CHOLHDL 4.1 08/02/2018 1353   CHOLHDL 2.7 01/04/2016 1138   VLDL 12 01/04/2016 1138   LDLCALC 116 (H) 08/02/2018 1353    CBC    Component Value Date/Time   WBC 7.5 08/26/2018 0048   RBC 3.47 (L) 08/26/2018 0048    HGB 11.1 (L) 08/26/2018 0048   HCT 34.0 (L) 08/26/2018 0048   PLT 162 08/26/2018 0048   MCV 98.0 08/26/2018 0048   MCH 32.0 08/26/2018 0048   MCHC 32.6 08/26/2018 0048   RDW 12.5 08/26/2018 0048   LYMPHSABS 2,201 10/05/2015 1705   MONOABS 568 10/05/2015 1705   EOSABS 284 10/05/2015 1705   BASOSABS 71 10/05/2015 1705   Lab Results  Component Value Date   HGBA1C 9.1 (A) 12/18/2018     Assessment & Plan:   1. 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 - Glucose (CBG) - HgB A1c  2. Type 2 diabetes mellitus with other specified complication, with long-term current use of insulin ([place]) See #1 above - metFORMIN (GLUCOPHAGE) 1000 MG tablet; Take 1 tablet (1,000 mg total) by mouth 2 (two) times daily.  Dispense: 180 tablet; Refill: 1 - clopidogrel (PLAVIX) 75 MG tablet; Take 1 tablet (75 mg total) by mouth daily.  Dispense: 90 tablet; Refill: 1 - Insulin Glargine (LANTUS SOLOSTAR) 100 UNIT/ML Solostar Pen; Inject 22 Units into the skin 2 (two) times daily.  Dispense: 30 mL; Refill: 3  3. Essential hypertension Elevated Advised to take antihypertensives in the morning as she has been taking it at night No regimen change today as blood pressure at last visit was normal Counseled on blood pressure goal of less than 130/80, low-sodium, DASH diet, medication compliance, 150 minutes of moderate intensity exercise per week. Discussed medication compliance,  adverse effects. - losartan-hydrochlorothiazide (HYZAAR) 100-25 MG tablet; Take 1 tablet by mouth daily. Dx: Hypertension  Dispense: 90 tablet;  Refill: 1 - Basic Metabolic Panel  4. Other hyperlipidemia LDL slightly elevated at 116 with goal LDL less than 100 - clopidogrel (PLAVIX) 75 MG tablet; Take 1 tablet (75 mg total) by mouth daily.  Dispense: 90 tablet; Refill: 1 - atorvastatin (LIPITOR) 80 MG tablet; Take 1 tablet (80 mg total) by mouth daily.  Dispense: 90 tablet; Refill: 1  5. Screening for colon cancer - Ambulatory referral to Gastroenterology  6. Vitamin D deficiency Previous vitamin D deficiency, will recheck again - VITAMIN D 25 Hydroxy (Vit-D Deficiency, Fractures)   Return in about 3 months (around 03/18/2019) for Chronic medical conditions.      Charlott Rakes, MD, FAAFP. [facility] and [place], [place]   12/18/2018, 2:29 PM

## 2018-12-19 LAB — BASIC METABOLIC PANEL WITH GFR
BUN/Creatinine Ratio: 18 (ref 12–28)
BUN: 27 mg/dL (ref 8–27)
CO2: 19 mmol/L — ABNORMAL LOW (ref 20–29)
Calcium: 9.9 mg/dL (ref 8.7–10.3)
Chloride: 100 mmol/L (ref 96–106)
Creatinine, Ser: 1.54 mg/dL — ABNORMAL HIGH (ref 0.57–1.00)
GFR calc Af Amer: 41 mL/min/{1.73_m2} — ABNORMAL LOW
GFR calc non Af Amer: 35 mL/min/{1.73_m2} — ABNORMAL LOW
Glucose: 190 mg/dL — ABNORMAL HIGH (ref 65–99)
Potassium: 4.2 mmol/L (ref 3.5–5.2)
Sodium: 142 mmol/L (ref 134–144)

## 2018-12-19 LAB — VITAMIN D 25 HYDROXY (VIT D DEFICIENCY, FRACTURES): Vit D, 25-Hydroxy: 36.7 ng/mL (ref 30.0–100.0)

## 2018-12-21 ENCOUNTER — Telehealth: Payer: Self-pay

## 2018-12-21 NOTE — Telephone Encounter (Signed)
-----   Message from Charlott Rakes, MD sent at 12/19/2018  1:15 PM EST ----- Please inform her labs are stable, vitamin D is normal

## 2018-12-21 NOTE — Telephone Encounter (Signed)
Patient was called and informed of lab results. 

## 2018-12-28 ENCOUNTER — Other Ambulatory Visit: Payer: Self-pay | Admitting: Pharmacist

## 2018-12-28 MED ORDER — INSULIN PEN NEEDLE 31G X 5 MM MISC: 1.0000 | Freq: Three times a day (TID) | 11 refills | Status: DC

## 2018-12-28 MED FILL — LANTUS SOLOSTAR 100 UNITS/M: 100 | 36 days supply | Qty: 15 | Fill #1

## 2018-12-28 MED FILL — ATORVASTATIN 80 MG TABLET: 80 | 90 days supply | Qty: 90 | Fill #0

## 2018-12-28 MED FILL — LOSARTAN-HCTZ 100-25 MG TAB: 100-25 | 90 days supply | Qty: 90 | Fill #0

## 2018-12-28 MED FILL — CLOPIDOGREL 75 MG TABLET: 75 | 90 days supply | Qty: 90 | Fill #0

## 2018-12-28 MED FILL — metFORMIN HCL 1000 MG TABS: 1000 | 90 days supply | Qty: 180 | Fill #0

## 2019-01-21 ENCOUNTER — Other Ambulatory Visit: Payer: Self-pay | Admitting: Family Medicine

## 2019-01-21 DIAGNOSIS — E1169 Type 2 diabetes mellitus with other specified complication: Secondary | ICD-10-CM

## 2019-01-25 MED FILL — LANTUS SOLOSTAR 100 UNITS/M: 100 | 67 days supply | Qty: 30 | Fill #0

## 2019-02-11 ENCOUNTER — Telehealth: Payer: Self-pay | Admitting: Family Medicine

## 2019-02-11 NOTE — Telephone Encounter (Signed)
Patient called would like a call back from the nurse because she thinks she is having GI problems patient was told that due to her symptoms she needed to get tested and patient refused. Please follow up.

## 2019-02-11 NOTE — Telephone Encounter (Signed)
I would recommend she get tested given exposure to her daughter and her symptoms. Please provide her with the number to text so she can secure a testing appointment. A telehealth visit will also be great but testing  priority.

## 2019-02-11 NOTE — Telephone Encounter (Signed)
Will route to PCP for review. 

## 2019-02-11 NOTE — Telephone Encounter (Signed)
Routed to PCP 

## 2019-02-11 NOTE — Telephone Encounter (Signed)
Patient called to inform pcp that she had EMS come to her home yesterday and they just checked her vitals. Patient stated that she has had no appetite, diarrhea, no fever present for the past few days. Patient stated that her daughter tested positive for covid although the patient has not yet been tested. She was told to fu with her pcp per EMS and is doing so. Please fu at your earliest convenience.

## 2019-02-12 NOTE — Telephone Encounter (Signed)
Can you assist patient with getting testing and telemedicine visit for follow-up with PCP

## 2019-02-13 ENCOUNTER — Other Ambulatory Visit: Payer: Self-pay

## 2019-02-13 ENCOUNTER — Emergency Department (HOSPITAL_COMMUNITY): Payer: Medicare Other

## 2019-02-13 ENCOUNTER — Inpatient Hospital Stay (HOSPITAL_COMMUNITY)
Admission: EM | Admit: 2019-02-13 | Discharge: 2019-02-18 | DRG: 177 | Disposition: A | Discharge status: Home / Self Care | Payer: Medicare Other | Attending: Internal Medicine | Admitting: Internal Medicine

## 2019-02-13 ENCOUNTER — Telehealth: Payer: Self-pay | Admitting: Family Medicine

## 2019-02-13 ENCOUNTER — Encounter (HOSPITAL_COMMUNITY): Payer: Self-pay | Admitting: Student

## 2019-02-13 ENCOUNTER — Ambulatory Visit: Payer: Medicare Other | Attending: Internal Medicine

## 2019-02-13 DIAGNOSIS — E785 Hyperlipidemia, unspecified: Secondary | ICD-10-CM | POA: Diagnosis not present

## 2019-02-13 DIAGNOSIS — H547 Unspecified visual loss: Secondary | ICD-10-CM

## 2019-02-13 DIAGNOSIS — E119 Type 2 diabetes mellitus without complications: Secondary | ICD-10-CM

## 2019-02-13 DIAGNOSIS — I69398 Other sequelae of cerebral infarction: Secondary | ICD-10-CM | POA: Diagnosis not present

## 2019-02-13 DIAGNOSIS — Z66 Do not resuscitate: Secondary | ICD-10-CM | POA: Diagnosis present

## 2019-02-13 DIAGNOSIS — N179 Acute kidney failure, unspecified: Secondary | ICD-10-CM | POA: Diagnosis present

## 2019-02-13 DIAGNOSIS — E113493 Type 2 diabetes mellitus with severe nonproliferative diabetic retinopathy without macular edema, bilateral: Secondary | ICD-10-CM | POA: Diagnosis not present

## 2019-02-13 DIAGNOSIS — Z9851 Tubal ligation status: Secondary | ICD-10-CM | POA: Diagnosis not present

## 2019-02-13 DIAGNOSIS — I639 Cerebral infarction, unspecified: Secondary | ICD-10-CM

## 2019-02-13 DIAGNOSIS — J9600 Acute respiratory failure, unspecified whether with hypoxia or hypercapnia: Secondary | ICD-10-CM | POA: Diagnosis not present

## 2019-02-13 DIAGNOSIS — Z8249 Family history of ischemic heart disease and other diseases of the circulatory system: Secondary | ICD-10-CM | POA: Diagnosis not present

## 2019-02-13 DIAGNOSIS — Z87891 Personal history of nicotine dependence: Secondary | ICD-10-CM | POA: Diagnosis not present

## 2019-02-13 DIAGNOSIS — J1282 Pneumonia due to coronavirus disease 2019: Secondary | ICD-10-CM | POA: Diagnosis present

## 2019-02-13 DIAGNOSIS — E1143 Type 2 diabetes mellitus with diabetic autonomic (poly)neuropathy: Secondary | ICD-10-CM | POA: Diagnosis not present

## 2019-02-13 DIAGNOSIS — E1129 Type 2 diabetes mellitus with other diabetic kidney complication: Secondary | ICD-10-CM | POA: Diagnosis not present

## 2019-02-13 DIAGNOSIS — E1122 Type 2 diabetes mellitus with diabetic chronic kidney disease: Secondary | ICD-10-CM | POA: Diagnosis present

## 2019-02-13 DIAGNOSIS — Z888 Allergy status to other drugs, medicaments and biological substances status: Secondary | ICD-10-CM

## 2019-02-13 DIAGNOSIS — T380X5A Adverse effect of glucocorticoids and synthetic analogues, initial encounter: Secondary | ICD-10-CM | POA: Diagnosis not present

## 2019-02-13 DIAGNOSIS — Z7982 Long term (current) use of aspirin: Secondary | ICD-10-CM

## 2019-02-13 DIAGNOSIS — Z818 Family history of other mental and behavioral disorders: Secondary | ICD-10-CM

## 2019-02-13 DIAGNOSIS — Z7902 Long term (current) use of antithrombotics/antiplatelets: Secondary | ICD-10-CM

## 2019-02-13 DIAGNOSIS — Z9842 Cataract extraction status, left eye: Secondary | ICD-10-CM | POA: Diagnosis not present

## 2019-02-13 DIAGNOSIS — Z9841 Cataract extraction status, right eye: Secondary | ICD-10-CM

## 2019-02-13 DIAGNOSIS — D649 Anemia, unspecified: Secondary | ICD-10-CM | POA: Diagnosis present

## 2019-02-13 DIAGNOSIS — N1832 Chronic kidney disease, stage 3b: Secondary | ICD-10-CM | POA: Diagnosis present

## 2019-02-13 DIAGNOSIS — E1165 Type 2 diabetes mellitus with hyperglycemia: Secondary | ICD-10-CM | POA: Diagnosis not present

## 2019-02-13 DIAGNOSIS — R531 Weakness: Secondary | ICD-10-CM | POA: Diagnosis not present

## 2019-02-13 DIAGNOSIS — I129 Hypertensive chronic kidney disease with stage 1 through stage 4 chronic kidney disease, or unspecified chronic kidney disease: Secondary | ICD-10-CM | POA: Diagnosis not present

## 2019-02-13 DIAGNOSIS — J9601 Acute respiratory failure with hypoxia: Secondary | ICD-10-CM | POA: Diagnosis not present

## 2019-02-13 DIAGNOSIS — Z833 Family history of diabetes mellitus: Secondary | ICD-10-CM | POA: Diagnosis not present

## 2019-02-13 DIAGNOSIS — N183 Chronic kidney disease, stage 3 unspecified: Secondary | ICD-10-CM

## 2019-02-13 DIAGNOSIS — I10 Essential (primary) hypertension: Secondary | ICD-10-CM | POA: Diagnosis present

## 2019-02-13 DIAGNOSIS — I959 Hypotension, unspecified: Secondary | ICD-10-CM | POA: Diagnosis not present

## 2019-02-13 DIAGNOSIS — R0902 Hypoxemia: Secondary | ICD-10-CM | POA: Diagnosis not present

## 2019-02-13 DIAGNOSIS — Z880 Allergy status to penicillin: Secondary | ICD-10-CM

## 2019-02-13 DIAGNOSIS — U071 COVID-19: Principal | ICD-10-CM | POA: Diagnosis present

## 2019-02-13 DIAGNOSIS — Z881 Allergy status to other antibiotic agents status: Secondary | ICD-10-CM

## 2019-02-13 DIAGNOSIS — Z79899 Other long term (current) drug therapy: Secondary | ICD-10-CM

## 2019-02-13 DIAGNOSIS — K3184 Gastroparesis: Secondary | ICD-10-CM | POA: Diagnosis present

## 2019-02-13 DIAGNOSIS — Z794 Long term (current) use of insulin: Secondary | ICD-10-CM | POA: Diagnosis not present

## 2019-02-13 DIAGNOSIS — Z885 Allergy status to narcotic agent status: Secondary | ICD-10-CM

## 2019-02-13 DIAGNOSIS — H543 Unqualified visual loss, both eyes: Secondary | ICD-10-CM | POA: Diagnosis not present

## 2019-02-13 DIAGNOSIS — R05 Cough: Secondary | ICD-10-CM | POA: Diagnosis not present

## 2019-02-13 DIAGNOSIS — Z743 Need for continuous supervision: Secondary | ICD-10-CM | POA: Diagnosis not present

## 2019-02-13 LAB — COMPREHENSIVE METABOLIC PANEL
ALT: 17 U/L (ref 0–44)
AST: 29 U/L (ref 15–41)
Albumin: 3.3 g/dL — ABNORMAL LOW (ref 3.5–5.0)
Alkaline Phosphatase: 63 U/L (ref 38–126)
Anion gap: 16 — ABNORMAL HIGH (ref 5–15)
BUN: 54 mg/dL — ABNORMAL HIGH (ref 8–23)
CO2: 22 mmol/L (ref 22–32)
Calcium: 8.4 mg/dL — ABNORMAL LOW (ref 8.9–10.3)
Chloride: 94 mmol/L — ABNORMAL LOW (ref 98–111)
Creatinine, Ser: 2.39 mg/dL — ABNORMAL HIGH (ref 0.44–1.00)
GFR calc Af Amer: 24 mL/min — ABNORMAL LOW (ref >60)
GFR calc non Af Amer: 21 mL/min — ABNORMAL LOW (ref >60)
Glucose, Bld: 336 mg/dL — ABNORMAL HIGH (ref 70–99)
Potassium: 3.6 mmol/L (ref 3.5–5.1)
Sodium: 132 mmol/L — ABNORMAL LOW (ref 135–145)
Total Bilirubin: 1.1 mg/dL (ref 0.3–1.2)
Total Protein: 7.1 g/dL (ref 6.5–8.1)

## 2019-02-13 LAB — CBC WITH DIFFERENTIAL/PLATELET
Abs Immature Granulocytes: 0.06 10*3/uL (ref 0.00–0.07)
Basophils Absolute: 0 10*3/uL (ref 0.0–0.1)
Basophils Relative: 0 %
Eosinophils Absolute: 0 10*3/uL (ref 0.0–0.5)
Eosinophils Relative: 0 %
HCT: 29.2 % — ABNORMAL LOW (ref 36.0–46.0)
Hemoglobin: 9.6 g/dL — ABNORMAL LOW (ref 12.0–15.0)
Immature Granulocytes: 1 %
Lymphocytes Relative: 11 %
Lymphs Abs: 1 10*3/uL (ref 0.7–4.0)
MCH: 30.6 pg (ref 26.0–34.0)
MCHC: 32.9 g/dL (ref 30.0–36.0)
MCV: 93 fL (ref 80.0–100.0)
Monocytes Absolute: 0.4 10*3/uL (ref 0.1–1.0)
Monocytes Relative: 4 %
Neutro Abs: 8.1 10*3/uL — ABNORMAL HIGH (ref 1.7–7.7)
Neutrophils Relative %: 84 %
Platelets: 346 10*3/uL (ref 150–400)
RBC: 3.14 MIL/uL — ABNORMAL LOW (ref 3.87–5.11)
RDW: 12.6 % (ref 11.5–15.5)
WBC: 9.6 10*3/uL (ref 4.0–10.5)
nRBC: 0 % (ref 0.0–0.2)

## 2019-02-13 LAB — POC SARS CORONAVIRUS 2 AG -  ED: SARS Coronavirus 2 Ag: POSITIVE — AB

## 2019-02-13 LAB — PROCALCITONIN: Procalcitonin: <0.1 ng/mL

## 2019-02-13 LAB — LACTATE DEHYDROGENASE: LDH: 293 U/L — ABNORMAL HIGH (ref 98–192)

## 2019-02-13 LAB — CBG MONITORING, ED: Glucose-Capillary: 289 mg/dL — ABNORMAL HIGH (ref 70–99)

## 2019-02-13 LAB — C-REACTIVE PROTEIN: CRP: 14.8 mg/dL — ABNORMAL HIGH (ref <1.0)

## 2019-02-13 LAB — LACTIC ACID, PLASMA: Lactic Acid, Venous: 1.1 mmol/L (ref 0.5–1.9)

## 2019-02-13 LAB — D-DIMER, QUANTITATIVE: D-Dimer, Quant: 1.09 ug/mL-FEU — ABNORMAL HIGH (ref 0.00–0.50)

## 2019-02-13 LAB — FIBRINOGEN: Fibrinogen: 674 mg/dL — ABNORMAL HIGH (ref 210–475)

## 2019-02-13 LAB — TRIGLYCERIDES: Triglycerides: 170 mg/dL — ABNORMAL HIGH (ref <150)

## 2019-02-13 LAB — FERRITIN: Ferritin: 436 ng/mL — ABNORMAL HIGH (ref 11–307)

## 2019-02-13 MED ORDER — DEXAMETHASONE SODIUM PHOSPHATE 10 MG/ML IJ SOLN: 6.0000 mg | Freq: Once | INTRAMUSCULAR | Status: DC

## 2019-02-13 MED ORDER — SODIUM CHLORIDE 0.9 % IV BOLUS: 500.0000 mL | Freq: Once | INTRAVENOUS | Status: DC

## 2019-02-13 NOTE — Telephone Encounter (Signed)
Patient returned call. States she has s/s of COVID -19. Children have been tested positive. She is unable to go for testing today because they all have COVID.  - fever was 101.   -Disorieneted in making judgements to where things           Are due blindness. Son at home with her but          Has minimal help.  -Checked blood sugar on yesterday and reading was 170.  States she has no appetite. Encouraged sips/chips (ice chips and Gatorade) broths or soup. She has eaten jello but nothing stays down d/t nausea and diarrhea.She states that both time that EMS has been out there, they reported normal VS.   Encouraged patient to practice good hand hygeine and stay safe within the home.  Scheduled a tele visit with PCP.

## 2019-02-13 NOTE — Telephone Encounter (Signed)
Left message on voicemail to return call x 2.

## 2019-02-13 NOTE — ED Provider Notes (Signed)
[facility] DEPT Provider Note   CSN: [id number] Arrival date & time: 02/13/19  1928     History Chief Complaint  Patient presents with  . Weakness    Adrienne [name] is a 64 y.o. female with a history of hypertension, hypercholesterolemia, diabetes mellitus, GERD, and gastroparesis who presents to the emergency department with complaints of generalized weakness for the past 1 week. Patient states she has felt generally weak and very fatigued, this is worse whenever she tries to do anything. This is accompanied by poor appetite/PO intake, a dry cough, subjective fevers, and chills. Notes a few episodes of emesis & diarrhea. She was initially having some URI sxs but these have since resolved. Her boyfriend, children & grandchildren are all sick with covid. She has not been tested. She denies chest pain, dyspnea, leg swelling, abdominal pain, or syncope. Per EMS SpO2 84% on RA- improved with application of 4L via [place]. Received tylenol this evening PTA.   HPI     Past Medical History:  Diagnosis Date  . Diabetes ([place])   . High cholesterol   . Hypertension     Patient Active Problem List   Diagnosis Date Noted  . GERD (gastroesophageal reflux disease) 01/03/2018  . Blindness 10/04/2017  . Vitamin D deficiency 08/24/2017  . Gastroparesis 05/04/2016  . Anemia 10/05/2015  . Fall 09/06/2015  . DKA (diabetic ketoacidoses) ([place]) 09/06/2015  . Hyperkalemia 09/05/2015  . Essential hypertension 12/20/2013  . Diabetes mellitus ([place]) 11/19/2013  . Other hyperlipidemia 11/19/2013  . 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 CATARACT EXTRACTION, BILATERAL     Laser eye surgery, cataract surgery bi-lat  . CESAREAN SECTION     [facility], [place]  . EYE SURGERY Bilateral 2012   LASER, CATARACT  . TEE WITHOUT CARDIOVERSION N/A 12/31/2013   Procedure: TRANSESOPHAGEAL ECHOCARDIOGRAM (TEE);   Surgeon: Laverda Page, MD;  Location: [facility];  Service: Cardiovascular;  Laterality: N/A;  H&P in file  . TUBAL LIGATION Bilateral      OB History   No obstetric history on file.     Family History  Problem Relation Age of Onset  . Dementia Mother   . Diabetes Father   . Hypertension Father   . Hypertension Sister   . Diabetes Sister   . Diabetes Sister   . Heart disease Sister     Social History   Tobacco Use  . Smoking status: Former Research scientist (life sciences)  . Smokeless tobacco: Never Used  . Tobacco comment: QUIT IN 1987  Substance Use Topics  . Alcohol use: No    Alcohol/week: 0.0 standard drinks    Comment: QUIT IN 1987  . Drug use: No    Frequency: 2.0 times per week    Comment: [place] Medications Prior to Admission medications   Medication Sig Start Date End Date Taking? Authorizing Provider  aspirin EC 81 MG tablet Take 81 mg by mouth daily.     [provider]  atorvastatin (LIPITOR) 80 MG tablet Take 1 tablet (80 mg total) by mouth daily. 12/18/18   Charlott Rakes, MD  brimonidine (ALPHAGAN) 0.15 % ophthalmic solution Place 1 drop into both eyes 2 (two) times daily.  05/20/15   [provider]  clopidogrel (PLAVIX) 75 MG tablet Take 1 tablet (75 mg total) by mouth daily. 12/18/18   Charlott Rakes, MD  Continuous Blood Gluc Sensor (FREESTYLE LIBRE 14  DAY SENSOR) MISC PLACE ONE SENSOR AND USE TO CHECK BLOOD SUGAR FOR 14 DAYS THEN REMOVE AND REPLACE. 01/21/19   Charlott Rakes, MD  dorzolamide-timolol (COSOPT) 22.3-6.8 MG/ML ophthalmic solution Place 1 drop into both eyes 2 (two) times daily.  08/05/15   [provider]  famotidine (PEPCID) 20 MG tablet Take 1 tablet (20 mg total) by mouth 2 (two) times daily. 01/03/18   Charlott Rakes, MD  Insulin Glargine (LANTUS SOLOSTAR) 100 UNIT/ML Solostar Pen Inject 22 Units into the skin 2 (two) times daily. 12/18/18   Charlott Rakes, MD  Insulin Pen Needle 31G X 5 MM MISC 1 Act by Does not  apply route 4 (four) times daily - after meals and at bedtime. 12/28/18   Charlott Rakes, MD  losartan-hydrochlorothiazide (HYZAAR) 100-25 MG tablet Take 1 tablet by mouth daily. Dx: Hypertension 12/18/18   Charlott Rakes, MD  metFORMIN (GLUCOPHAGE) 1000 MG tablet Take 1 tablet (1,000 mg total) by mouth 2 (two) times daily. 12/18/18   Charlott Rakes, MD  Misc. Devices MISC Blood pressure monitor. Dx: Hypertension 12/18/18   Charlott Rakes, MD    Allergies    Codeine, Hydrocodone, Oxycodone, Penicillins, Clindamycin/lincomycin, Niacin and related, Penicillin g, and Niacin  Review of Systems   Review of Systems  Constitutional: Positive for appetite change, chills, fatigue and fever.  Respiratory: Positive for cough. Negative for shortness of breath.   Cardiovascular: Negative for chest pain and leg swelling.  Gastrointestinal: Positive for diarrhea, nausea and vomiting. Negative for abdominal pain.  Genitourinary: Negative for dysuria.  Neurological: Positive for weakness (generalized). Negative for syncope.  All other systems reviewed and are negative.   Physical Exam Updated Vital Signs BP 136/73 (BP Location: Left Arm)   Pulse 84   Temp 99.2 F (37.3 C) (Oral)   Resp (!) 21   Ht 5\' 6"  (1.676 m)   Wt 88.5 kg   SpO2 99%   BMI 31.47 kg/m   Physical Exam Vitals and nursing note reviewed.  Constitutional:      Appearance: She is well-developed. She is ill-appearing. She is not toxic-appearing.  HENT:     Head: Normocephalic and atraumatic.     Mouth/Throat:     Mouth: Mucous membranes are dry.  Eyes:     General:        Right eye: No discharge.        Left eye: No discharge.     Conjunctiva/sclera: Conjunctivae normal.  Cardiovascular:     Rate and Rhythm: Normal rate and regular rhythm.  Pulmonary:     Breath sounds: No wheezing.     Comments: Course bibasilar breath sounds SpO2 dropped to 88% on RA, applied 1 and subsequently 2L via [place] to improved to >92%.  Chest:       Chest wall: No tenderness.  Abdominal:     General: There is no distension.     Palpations: Abdomen is soft.     Tenderness: There is no abdominal tenderness. There is no guarding or rebound.  Musculoskeletal:        General: No tenderness.     Cervical back: Neck supple.     Right lower leg: No edema.     Left lower leg: No edema.  Skin:    General: Skin is warm and dry.     Findings: No rash.  Neurological:     Mental Status: She is alert.     Comments: Clear speech.   Psychiatric:  Behavior: Behavior normal.    ED Results / Procedures / Treatments   Labs (all labs ordered are listed, but only abnormal results are displayed) Labs Reviewed  CBC WITH DIFFERENTIAL/PLATELET - Abnormal; Notable for the following components:      Result Value   RBC 3.14 (*)    Hemoglobin 9.6 (*)    HCT 29.2 (*)    Neutro Abs 8.1 (*)    All other components within normal limits  COMPREHENSIVE METABOLIC PANEL - Abnormal; Notable for the following components:   Sodium 132 (*)    Chloride 94 (*)    Glucose, Bld 336 (*)    BUN 54 (*)    Creatinine, Ser 2.39 (*)    Calcium 8.4 (*)    Albumin 3.3 (*)    GFR calc non Af Amer 21 (*)    GFR calc Af Amer 24 (*)    Anion gap 16 (*)    All other components within normal limits  D-DIMER, QUANTITATIVE (NOT AT [facility]) - Abnormal; Notable for the following components:   D-Dimer, Quant 1.09 (*)    All other components within normal limits  LACTATE DEHYDROGENASE - Abnormal; Notable for the following components:   LDH 293 (*)    All other components within normal limits  FERRITIN - Abnormal; Notable for the following components:   Ferritin 436 (*)    All other components within normal limits  TRIGLYCERIDES - Abnormal; Notable for the following components:   Triglycerides 170 (*)    All other components within normal limits  FIBRINOGEN - Abnormal; Notable for the following components:   Fibrinogen 674 (*)    All other components within normal  limits  C-REACTIVE PROTEIN - Abnormal; Notable for the following components:   CRP 14.8 (*)    All other components within normal limits  CBG MONITORING, ED - Abnormal; Notable for the following components:   Glucose-Capillary 289 (*)    All other components within normal limits  POC SARS CORONAVIRUS 2 AG -  ED - Abnormal; Notable for the following components:   SARS Coronavirus 2 Ag POSITIVE (*)    All other components within normal limits  CULTURE, BLOOD (ROUTINE X 2)  CULTURE, BLOOD (ROUTINE X 2)  RESPIRATORY PANEL BY RT PCR (FLU A&B, COVID)  LACTIC ACID, PLASMA  PROCALCITONIN  POC SARS CORONAVIRUS 2 AG -  ED    EKG None  Radiology DG Chest Port 1 View  Result Date: 02/13/2019 CLINICAL DATA:  Cough and weakness.  COVID exposure. EXAM: PORTABLE CHEST 1 VIEW COMPARISON:  None. FINDINGS: The heart size and mediastinal contours are within normal limits. Normal pulmonary vascularity. Hazy patchy opacities in the right mid lung and at both lung bases. No pleural effusion or pneumothorax. No acute osseous abnormality. IMPRESSION: Hazy bilateral airspace disease, concerning for COVID-19 pneumonia given clinical history. Electronically Signed   By: Titus Dubin M.D.   On: 02/13/2019 20:50    Procedures .Critical Care Performed by: Amaryllis Dyke, PA-C Authorized by: Amaryllis Dyke, PA-C     CRITICAL CARE Performed by: Kennith Maes   Total critical care time: 40 minutes  Critical care time was exclusive of separately billable procedures and treating other patients.  Critical care was necessary to treat or prevent imminent or life-threatening deterioration.  Critical care was time spent personally by me on the following activities: development of treatment plan with patient and/or surrogate as well as nursing, discussions with consultants, evaluation of patient's response to treatment,  examination of patient, obtaining history from patient or surrogate,  ordering and performing treatments and interventions, ordering and review of laboratory studies, ordering and review of radiographic studies, pulse oximetry and re-evaluation of patient's condition.   (including critical care time)  Medications Ordered in ED Medications - No data to display  ED Course  I have reviewed the triage vital signs and the nursing notes.  Pertinent labs & imaging results that were available during my care of the patient were reviewed by me and considered in my medical decision making (see chart for details).  Adrienne [name] was evaluated in Emergency Department on 02/13/2019 for the symptoms described in the history of present illness. He/she was evaluated in the context of the global COVID-19 pandemic, which necessitated consideration that the patient might be at risk for infection with the SARS-CoV-2 virus that causes COVID-19. Institutional protocols and algorithms that pertain to the evaluation of patients at risk for COVID-19 are in a state of rapid change based on information released by regulatory bodies including the CDC and federal and state organizations. These policies and algorithms were followed during the patient's care in the ED.    MDM Rules/Calculators/A&P                      Patient with multiple sick contacts with COVID 19 who presents to the ED for evaluation of generalized weakness. Patient ill appearing, but nontoxic. Hypoxic into the 80s on RA- improved with 2L O2 via [place]. Vitals otherwise reassuring. Lungs with course bibasilar breath sounds. MM dry.   EKG: no STEMI, sinus rhythm.  COVID positive.  CBC: anemia mildly worsened from most recent on record but within prior ranges. No leukocytosis/leukopenia/plt dysfunction.  CMP: AKI with creatinine 2.39/54 compared to 1.54/27 respectively.  Mild electrolyte abnormalities as above. Hyperglycemia, bicarb normal, mild anion gap elevation.  Lactic acid & procalcitonin WNL COVID inflammatory  markers are all somewhat elevated CXR: Hazy bilateral airspace disease, concerning for COVID-19 pneumonia given clinical history  Patient with covid 19 likely leading to acute hypoxic respiratory failure & AKI. Will start fluids, consult hospitalist service.   23:10: CONSULT: Discussed with hospitalist Dr. Hal Hope who accepts admission.   Findings and plan of care discussed with supervising physician Dr. Roslynn Amble who is in agreement.   Final Clinical Impression(s) / ED Diagnoses Final diagnoses:  COVID-19  Acute hypoxemic respiratory failure ([place])  AKI (acute kidney injury) [facility])    Rx / DC Orders ED Discharge Orders    None       Amaryllis Dyke, PA-C 02/13/19 2332    Lucrezia Starch, MD 02/14/19 1253

## 2019-02-13 NOTE — Telephone Encounter (Signed)
Looks like she had an appointment at [facility] to get tested today but missed it.  I will see her at her telehealth visit tomorrow but if symptoms worsen she needs to call EMS.

## 2019-02-13 NOTE — ED Notes (Addendum)
Pt. CBG 289, RN, Mortimer Fries made aware.

## 2019-02-13 NOTE — Telephone Encounter (Signed)
Medical Assistant left message on patient's home and cell voicemail. Voicemail states to give a call back to [place] with [facility] at [phone]. !!!Patient missed her [place] appointment today. Dr. Margarita Rana will speak with her on her tele-heath visit tomorrow. She should report to the ER tonight if symptoms worsen!!!

## 2019-02-13 NOTE — Telephone Encounter (Signed)
Patient returned nurse call. Please f/u

## 2019-02-13 NOTE — Telephone Encounter (Signed)
Patient was informed of note placed by nurse and pcp regarding going to ED if symptoms worsen. Patient verbalized understanding and had no further questions or concerns.

## 2019-02-13 NOTE — ED Triage Notes (Signed)
Pt from home where son daughter and other family members are COVID + . Pt c/o generalized weakness loss of appetite and poor po intake. Denies SOB but sat enroute was 84% room air that increased to 94% on 4l n/c. Pt received 1000mg  tylenol at 1800 tonight for pt feeling warm to touch.

## 2019-02-14 ENCOUNTER — Encounter (HOSPITAL_COMMUNITY): Payer: Self-pay | Admitting: Internal Medicine

## 2019-02-14 ENCOUNTER — Ambulatory Visit: Payer: Medicare Other

## 2019-02-14 DIAGNOSIS — D649 Anemia, unspecified: Secondary | ICD-10-CM | POA: Diagnosis present

## 2019-02-14 DIAGNOSIS — E1143 Type 2 diabetes mellitus with diabetic autonomic (poly)neuropathy: Secondary | ICD-10-CM | POA: Diagnosis present

## 2019-02-14 DIAGNOSIS — I10 Essential (primary) hypertension: Secondary | ICD-10-CM | POA: Diagnosis not present

## 2019-02-14 DIAGNOSIS — Z9851 Tubal ligation status: Secondary | ICD-10-CM | POA: Diagnosis not present

## 2019-02-14 DIAGNOSIS — T380X5A Adverse effect of glucocorticoids and synthetic analogues, initial encounter: Secondary | ICD-10-CM | POA: Diagnosis not present

## 2019-02-14 DIAGNOSIS — Z66 Do not resuscitate: Secondary | ICD-10-CM | POA: Diagnosis present

## 2019-02-14 DIAGNOSIS — J9600 Acute respiratory failure, unspecified whether with hypoxia or hypercapnia: Secondary | ICD-10-CM | POA: Diagnosis present

## 2019-02-14 DIAGNOSIS — E1165 Type 2 diabetes mellitus with hyperglycemia: Secondary | ICD-10-CM | POA: Diagnosis present

## 2019-02-14 DIAGNOSIS — K3184 Gastroparesis: Secondary | ICD-10-CM | POA: Diagnosis present

## 2019-02-14 DIAGNOSIS — N179 Acute kidney failure, unspecified: Secondary | ICD-10-CM

## 2019-02-14 DIAGNOSIS — Z9842 Cataract extraction status, left eye: Secondary | ICD-10-CM | POA: Diagnosis not present

## 2019-02-14 DIAGNOSIS — J1282 Pneumonia due to coronavirus disease 2019: Secondary | ICD-10-CM | POA: Diagnosis present

## 2019-02-14 DIAGNOSIS — Z7902 Long term (current) use of antithrombotics/antiplatelets: Secondary | ICD-10-CM | POA: Diagnosis not present

## 2019-02-14 DIAGNOSIS — E1129 Type 2 diabetes mellitus with other diabetic kidney complication: Secondary | ICD-10-CM | POA: Diagnosis not present

## 2019-02-14 DIAGNOSIS — N1832 Chronic kidney disease, stage 3b: Secondary | ICD-10-CM | POA: Diagnosis present

## 2019-02-14 DIAGNOSIS — E785 Hyperlipidemia, unspecified: Secondary | ICD-10-CM | POA: Diagnosis present

## 2019-02-14 DIAGNOSIS — U071 COVID-19: Secondary | ICD-10-CM | POA: Diagnosis not present

## 2019-02-14 DIAGNOSIS — Z833 Family history of diabetes mellitus: Secondary | ICD-10-CM | POA: Diagnosis not present

## 2019-02-14 DIAGNOSIS — Z818 Family history of other mental and behavioral disorders: Secondary | ICD-10-CM | POA: Diagnosis not present

## 2019-02-14 DIAGNOSIS — I69398 Other sequelae of cerebral infarction: Secondary | ICD-10-CM | POA: Diagnosis not present

## 2019-02-14 DIAGNOSIS — H543 Unqualified visual loss, both eyes: Secondary | ICD-10-CM | POA: Diagnosis not present

## 2019-02-14 DIAGNOSIS — J9601 Acute respiratory failure with hypoxia: Secondary | ICD-10-CM | POA: Diagnosis present

## 2019-02-14 DIAGNOSIS — Z87891 Personal history of nicotine dependence: Secondary | ICD-10-CM | POA: Diagnosis not present

## 2019-02-14 DIAGNOSIS — Z8249 Family history of ischemic heart disease and other diseases of the circulatory system: Secondary | ICD-10-CM | POA: Diagnosis not present

## 2019-02-14 DIAGNOSIS — R531 Weakness: Secondary | ICD-10-CM | POA: Diagnosis present

## 2019-02-14 DIAGNOSIS — E1122 Type 2 diabetes mellitus with diabetic chronic kidney disease: Secondary | ICD-10-CM | POA: Diagnosis present

## 2019-02-14 DIAGNOSIS — Z9841 Cataract extraction status, right eye: Secondary | ICD-10-CM | POA: Diagnosis not present

## 2019-02-14 DIAGNOSIS — H547 Unspecified visual loss: Secondary | ICD-10-CM | POA: Diagnosis present

## 2019-02-14 DIAGNOSIS — I129 Hypertensive chronic kidney disease with stage 1 through stage 4 chronic kidney disease, or unspecified chronic kidney disease: Secondary | ICD-10-CM | POA: Diagnosis present

## 2019-02-14 LAB — GLUCOSE, CAPILLARY
Glucose-Capillary: 421 mg/dL — ABNORMAL HIGH (ref 70–99)
Glucose-Capillary: 374 mg/dL — ABNORMAL HIGH (ref 70–99)
Glucose-Capillary: 291 mg/dL — ABNORMAL HIGH (ref 70–99)

## 2019-02-14 LAB — RESPIRATORY PANEL BY RT PCR (FLU A&B, COVID)
Influenza A by PCR: NEGATIVE
Influenza B by PCR: NEGATIVE
SARS Coronavirus 2 by RT PCR: POSITIVE — AB

## 2019-02-14 LAB — CBG MONITORING, ED: Glucose-Capillary: 378 mg/dL — ABNORMAL HIGH (ref 70–99)

## 2019-02-14 LAB — POC SARS CORONAVIRUS 2 AG -  ED: SARS Coronavirus 2 Ag: POSITIVE — AB

## 2019-02-14 MED ORDER — TOCILIZUMAB 400 MG/20ML IV SOLN
700.0000 mg | Freq: Once | INTRAVENOUS | Status: AC
  Administered 2019-02-14: 700 mg via INTRAVENOUS
  Filled 2019-02-14: qty 35

## 2019-02-14 MED ORDER — INSULIN GLARGINE 100 UNIT/ML ~~LOC~~ SOLN
20.0000 [IU] | Freq: Two times a day (BID) | SUBCUTANEOUS | Status: DC
  Administered 2019-02-14 – 2019-02-16 (×4): 20 [IU] via SUBCUTANEOUS
  Filled 2019-02-14 (×5): qty 0.2

## 2019-02-14 MED ORDER — INSULIN ASPART 100 UNIT/ML ~~LOC~~ SOLN
10.0000 [IU] | Freq: Once | SUBCUTANEOUS | Status: AC
  Administered 2019-02-14: 10 [IU] via SUBCUTANEOUS

## 2019-02-14 MED ORDER — LINAGLIPTIN 5 MG PO TABS
5.0000 mg | ORAL_TABLET | Freq: Every day | ORAL | Status: DC
  Administered 2019-02-14: 5 mg via ORAL
  Filled 2019-02-14: qty 1

## 2019-02-14 MED ORDER — INSULIN ASPART 100 UNIT/ML ~~LOC~~ SOLN
0.0000 [IU] | Freq: Three times a day (TID) | SUBCUTANEOUS | Status: DC
  Administered 2019-02-14: 9 [IU] via SUBCUTANEOUS
  Filled 2019-02-14: qty 0.09

## 2019-02-14 MED ORDER — SODIUM CHLORIDE 0.9 % IV SOLN: INTRAVENOUS | Status: DC

## 2019-02-14 MED ORDER — SODIUM CHLORIDE 0.9 % IV SOLN
200.0000 mg | Freq: Once | INTRAVENOUS | Status: AC
  Administered 2019-02-14: 200 mg via INTRAVENOUS
  Filled 2019-02-14: qty 200

## 2019-02-14 MED ORDER — INSULIN ASPART 100 UNIT/ML ~~LOC~~ SOLN: 0.0000 [IU] | Freq: Three times a day (TID) | SUBCUTANEOUS | Status: DC

## 2019-02-14 MED ORDER — ACETAMINOPHEN 650 MG RE SUPP: 650.0000 mg | Freq: Four times a day (QID) | RECTAL | Status: DC | PRN

## 2019-02-14 MED ORDER — INSULIN GLARGINE 100 UNIT/ML ~~LOC~~ SOLN
22.0000 [IU] | Freq: Two times a day (BID) | SUBCUTANEOUS | Status: DC
  Administered 2019-02-14: 22 [IU] via SUBCUTANEOUS
  Filled 2019-02-14 (×2): qty 0.22

## 2019-02-14 MED ORDER — ENOXAPARIN SODIUM 40 MG/0.4ML ~~LOC~~ SOLN: 40.0000 mg | SUBCUTANEOUS | Status: DC

## 2019-02-14 MED ORDER — INSULIN ASPART 100 UNIT/ML ~~LOC~~ SOLN
0.0000 [IU] | Freq: Three times a day (TID) | SUBCUTANEOUS | Status: DC
  Administered 2019-02-14: 20 [IU] via SUBCUTANEOUS
  Administered 2019-02-15: 11 [IU] via SUBCUTANEOUS
  Administered 2019-02-15 (×2): 15 [IU] via SUBCUTANEOUS
  Administered 2019-02-16: 7 [IU] via SUBCUTANEOUS
  Administered 2019-02-16: 11 [IU] via SUBCUTANEOUS
  Administered 2019-02-16: 15 [IU] via SUBCUTANEOUS
  Administered 2019-02-18: 3 [IU] via SUBCUTANEOUS

## 2019-02-14 MED ORDER — ASPIRIN EC 81 MG PO TBEC
81.0000 mg | DELAYED_RELEASE_TABLET | Freq: Every day | ORAL | Status: DC
  Administered 2019-02-14 – 2019-02-18 (×5): 81 mg via ORAL
  Filled 2019-02-14 (×5): qty 1

## 2019-02-14 MED ORDER — INSULIN ASPART 100 UNIT/ML ~~LOC~~ SOLN
0.0000 [IU] | Freq: Every day | SUBCUTANEOUS | Status: DC
  Administered 2019-02-14 – 2019-02-15 (×2): 3 [IU] via SUBCUTANEOUS
  Administered 2019-02-17: 5 [IU] via SUBCUTANEOUS

## 2019-02-14 MED ORDER — INSULIN GLARGINE 100 UNIT/ML ~~LOC~~ SOLN
30.0000 [IU] | Freq: Two times a day (BID) | SUBCUTANEOUS | Status: DC
  Filled 2019-02-14: qty 0.3

## 2019-02-14 MED ORDER — ONDANSETRON HCL 4 MG/2ML IJ SOLN
4.0000 mg | Freq: Four times a day (QID) | INTRAMUSCULAR | Status: DC | PRN
  Administered 2019-02-16: 4 mg via INTRAVENOUS
  Filled 2019-02-14: qty 2

## 2019-02-14 MED ORDER — ACETAMINOPHEN 325 MG PO TABS
650.0000 mg | ORAL_TABLET | Freq: Four times a day (QID) | ORAL | Status: DC | PRN
  Administered 2019-02-14 – 2019-02-16 (×3): 650 mg via ORAL
  Filled 2019-02-14 (×3): qty 2

## 2019-02-14 MED ORDER — INSULIN ASPART 100 UNIT/ML ~~LOC~~ SOLN
8.0000 [IU] | Freq: Three times a day (TID) | SUBCUTANEOUS | Status: DC
  Administered 2019-02-14 – 2019-02-16 (×7): 8 [IU] via SUBCUTANEOUS

## 2019-02-14 MED ORDER — ONDANSETRON HCL 4 MG PO TABS
4.0000 mg | ORAL_TABLET | Freq: Four times a day (QID) | ORAL | Status: DC | PRN
  Administered 2019-02-15 (×2): 4 mg via ORAL
  Filled 2019-02-14 (×2): qty 1

## 2019-02-14 MED ORDER — DEXAMETHASONE SODIUM PHOSPHATE 10 MG/ML IJ SOLN
6.0000 mg | Freq: Every day | INTRAMUSCULAR | Status: DC
  Administered 2019-02-14 – 2019-02-18 (×5): 6 mg via INTRAVENOUS
  Filled 2019-02-14 (×6): qty 1

## 2019-02-14 MED ORDER — HEPARIN SODIUM (PORCINE) 5000 UNIT/ML IJ SOLN
5000.0000 [IU] | Freq: Three times a day (TID) | INTRAMUSCULAR | Status: DC
  Administered 2019-02-14 – 2019-02-15 (×4): 5000 [IU] via SUBCUTANEOUS
  Filled 2019-02-14 (×4): qty 1

## 2019-02-14 MED ORDER — SODIUM CHLORIDE 0.9 % IV SOLN
100.0000 mg | Freq: Every day | INTRAVENOUS | Status: AC
  Administered 2019-02-15 – 2019-02-18 (×4): 100 mg via INTRAVENOUS
  Filled 2019-02-14 (×4): qty 20

## 2019-02-14 MED ORDER — CLOPIDOGREL BISULFATE 75 MG PO TABS
75.0000 mg | ORAL_TABLET | Freq: Every day | ORAL | Status: DC
  Administered 2019-02-14 – 2019-02-18 (×5): 75 mg via ORAL
  Filled 2019-02-14 (×5): qty 1

## 2019-02-14 MED ORDER — ATORVASTATIN CALCIUM 40 MG PO TABS
80.0000 mg | ORAL_TABLET | Freq: Every day | ORAL | Status: DC
  Administered 2019-02-14 – 2019-02-18 (×5): 80 mg via ORAL
  Filled 2019-02-14 (×4): qty 2
  Filled 2019-02-14: qty 8

## 2019-02-14 NOTE — ED Notes (Signed)
[facility] at bedside to transfer patient °

## 2019-02-14 NOTE — Progress Notes (Signed)
Patient was seen and examined earlier this AM. Full note will follow.

## 2019-02-14 NOTE — Progress Notes (Signed)
Patient lunch BG 4503, then repeat 423,  Dent sure chat to MD.

## 2019-02-14 NOTE — Progress Notes (Signed)
PROGRESS NOTE    Adrienne [name]  [id number] DOB: 05/02/1955 DOA: 02/13/2019 PCP: Charlott Rakes, MD    Brief Narrative:  64 y.o. female with history of diabetes mellitus type 2, chronic kidney disease stage III, stroke with blindness and hypertension presents to the ER because of increasing weakness generalized body aches with nausea vomiting diarrhea ongoing for last 1 week.  Has been a nonproductive cough with shortness of breath.  Subjective feeling of fever and chills.  Patient states her family members have been tested positive for COVID-19.  ED Course: In the ER patient was hypoxic requiring 2 L oxygen to maintain sats.  Chest x-ray showed bilateral infiltrates.  Covid test came back positive.  Labs show creatinine of 2.39 which has worsened from recent past last month of 1.5.  LFTs were unremarkable.  Blood sugar was 336 CBC shows hemoglobin of 9.6 platelets 346 procalcitonin less than 0.10 ferritin 436 CRP 14.8.  EKG normal sinus rhythm.  Patient was given IV fluid bolus and admitted for acute respiratory failure with hypoxia secondary to COVID-19 infection.  Start patient on IV remdesivir and IV Decadron.  Assessment & Plan:   Principal Problem:   Acute respiratory failure due to COVID-19 [facility]) Active Problems:   Blindness   ARF (acute renal failure) (HCC)   DM (diabetes mellitus), type 2 with renal complications ([place])  1. Acute respiratory failure with hypoxia secondary to COVID-19 infection 1. Patient was started on IV remdesivir and IV Decadron at time of admit 2. Dr. Hal Hope discussed with patient about the off label use of Actemra and its contraindication and side effects patient agrees to get it.  Actemra was ordered at time of presentation. 3. Follow serial inflammatory markers 2. Diabetes mellitus type 2 with hyperglycemia  1. blood sugar is elevated with mildly elevated anion gap.   2. Patient presently on Lantus insulin twice daily with sliding scale  coverage and Tradjenta was added per the protocol for Covid infection. 3. Glucose trends remain poorly controlled with readings in the upper 300's to 400's 4. Will increase lantus to 30 units BID, add 8 units meal coverage. Will also increase SSI from moderate to resistant scale 3. Acute on chronic kidney disease stage III likely from poor oral intake and prerenal causes.   1. Holding off patient's ARB and hydrochlorothiazide.   2. Continue with gentle hydration 3. Cont to follow serial renal function. 4. Hypertension 1. BP currently stable and controlled 2. Continued on PRN hydralazine 5. Chronic anemia likely from renal disease 1. Hemodynamically stable 2. Hgb 9.6 today 3. Follow serial CBC 6. History of stroke with blindness on Lipitor and antiplatelet agents. 1. Seems stable at this time  DVT prophylaxis: Heparin subq Code Status: DNR Family Communication: Pt in room, family not at bedside Disposition Plan: Home when weaned off O2 and clnically improved  Consultants:     Procedures:     Antimicrobials: Anti-infectives (From admission, onward)   Start     Dose/Rate Route Frequency Ordered Stop   02/15/19 1000  remdesivir 100 mg in sodium chloride 0.9 % 100 mL IVPB     100 mg 200 mL/hr over 30 Minutes Intravenous Daily 02/14/19 0244 02/19/19 0959   02/14/19 0315  remdesivir 200 mg in sodium chloride 0.9% 250 mL IVPB     200 mg 580 mL/hr over 30 Minutes Intravenous Once 02/14/19 0244 02/14/19 0401       Subjective: Reports generalized malaise this AM  Objective: Vitals:   02/14/19 0830  02/14/19 0915 02/14/19 1045 02/14/19 1100  BP: 137/72 127/65 (!) 110/56 (!) 102/48  Pulse: 82 83 79 77  Resp: 19 (!) 27 (!) 28 19  Temp:    98.2 F (36.8 C)  TempSrc:    Oral  SpO2: 92% 96% 92% 93%  Weight:      Height:        Intake/Output Summary (Last 24 hours) at 02/14/2019 1404 Last data filed at 02/14/2019 0948 Gross per 24 hour  Intake 156.67 ml  Output --  Net  156.67 ml   Filed Weights   02/13/19 1953  Weight: 88.5 kg    Examination:  General exam: Appears calm and comfortable  Respiratory system: Clear to auscultation. Respiratory effort normal. Cardiovascular system: S1 & S2 heard, Regular Gastrointestinal system: Abdomen is nondistended, soft and nontender. No organomegaly or masses felt. Normal bowel sounds heard. Central nervous system: Alert and oriented. No focal neurological deficits. Extremities: Symmetric 5 x 5 power. Skin: No rashes, lesions Psychiatry: Judgement and insight appear normal. Mood & affect appropriate.   Data Reviewed: I have personally reviewed following labs and imaging studies  CBC: Recent Labs  Lab 02/13/19 2030  WBC 9.6  NEUTROABS 8.1*  HGB 9.6*  HCT 29.2*  MCV 93.0  PLT 123456   Basic Metabolic Panel: Recent Labs  Lab 02/13/19 2030  NA 132*  K 3.6  CL 94*  CO2 22  GLUCOSE 336*  BUN 54*  CREATININE 2.39*  CALCIUM 8.4*   GFR: Estimated Creatinine Clearance: 26.7 mL/min (A) (by C-G formula based on SCr of 2.39 mg/dL (H)). Liver Function Tests: Recent Labs  Lab 02/13/19 2030  AST 29  ALT 17  ALKPHOS 63  BILITOT 1.1  PROT 7.1  ALBUMIN 3.3*   No results for input(s): LIPASE, AMYLASE in the last 168 hours. No results for input(s): AMMONIA in the last 168 hours. Coagulation Profile: No results for input(s): INR, PROTIME in the last 168 hours. Cardiac Enzymes: No results for input(s): CKTOTAL, CKMB, CKMBINDEX, TROPONINI in the last 168 hours. BNP (last 3 results) No results for input(s): PROBNP in the last 8760 hours. HbA1C: No results for input(s): HGBA1C in the last 72 hours. CBG: Recent Labs  Lab 02/13/19 1950 02/14/19 0820 02/14/19 1148  GLUCAP 289* 378* 421*   Lipid Profile: Recent Labs    02/13/19 2030  TRIG 170*   Thyroid Function Tests: No results for input(s): TSH, T4TOTAL, FREET4, T3FREE, THYROIDAB in the last 72 hours. Anemia Panel: Recent Labs     02/13/19 2030  FERRITIN 436*   Sepsis Labs: Recent Labs  Lab 02/13/19 2030  PROCALCITON <0.10  LATICACIDVEN 1.1    Recent Results (from the past 240 hour(s))  Respiratory Panel by RT PCR (Flu A&B, Covid) - Nasopharyngeal Swab     Status: Abnormal   Collection Time: 02/13/19  8:15 PM   Specimen: Nasopharyngeal Swab  Result Value Ref Range Status   SARS Coronavirus 2 by RT PCR POSITIVE (A) NEGATIVE Final    Comment: CRITICAL RESULT CALLED TO, READ BACK BY AND VERIFIED WITH: RN Jordan Hawks AT [id number] 02/14/19 CRUICKSHANK A (NOTE) SARS-CoV-2 target nucleic acids are DETECTED. SARS-CoV-2 RNA is generally detectable in upper respiratory specimens  during the acute phase of infection. Positive results are indicative of the presence of the identified virus, but do not rule out bacterial infection or co-infection with other pathogens not detected by the test. Clinical correlation with patient history and other diagnostic information is necessary to determine patient  infection status. The expected result is Negative. Fact Sheet for Patients:  PinkCheek.be Fact Sheet for Healthcare Providers: GravelBags.it This test is not yet approved or cleared by the [place] FDA and  has been authorized for detection and/or diagnosis of SARS-CoV-2 by FDA under an Emergency Use Authorization (EUA).  This EUA will remain in effect (meaning this te st can be used) for the duration of  the COVID-19 declaration under Section 564(b)(1) of the Act, 21 U.S.C. section 360bbb-3(b)(1), unless the authorization is terminated or revoked sooner.    Influenza A by PCR NEGATIVE NEGATIVE Final   Influenza B by PCR NEGATIVE NEGATIVE Final    Comment: (NOTE) The Xpert Xpress SARS-CoV-2/FLU/RSV assay is intended as an aid in  the diagnosis of influenza from Nasopharyngeal swab specimens and  should not be used as a sole basis for treatment. Nasal washings  and  aspirates are unacceptable for Xpert Xpress SARS-CoV-2/FLU/RSV  testing. Fact Sheet for Patients: PinkCheek.be Fact Sheet for Healthcare Providers: GravelBags.it This test is not yet approved or cleared by the [place] FDA and  has been authorized for detection and/or diagnosis of SARS-CoV-2 by  FDA under an Emergency Use Authorization (EUA). This EUA will remain  in effect (meaning this test can be used) for the duration of the  Covid-19 declaration under Section 564(b)(1) of the Act, 21  U.S.C. section 360bbb-3(b)(1), unless the authorization is  terminated or revoked. Performed at [facility], [place] [address], [place], [place] [address]   Blood Culture (routine x 2)     Status: None (Preliminary result)   Collection Time: 02/13/19  8:22 PM   Specimen: BLOOD LEFT FOREARM  Result Value Ref Range Status   Specimen Description   Final    BLOOD LEFT FOREARM Performed at [facility], [place] [address], [place], [place] [address]    Special Requests   Final    BOTTLES DRAWN AEROBIC AND ANAEROBIC Blood Culture adequate volume Performed at [facility] [address], [place], [place] [address]    Culture   Final    NO GROWTH < 12 HOURS Performed at [facility] [address] [place], [place] [address]    Report Status PENDING  Incomplete     Radiology Studies: DG Chest Port 1 View  Result Date: 02/13/2019 CLINICAL DATA:  Cough and weakness.  COVID exposure. EXAM: PORTABLE CHEST 1 VIEW COMPARISON:  None. FINDINGS: The heart size and mediastinal contours are within normal limits. Normal pulmonary vascularity. Hazy patchy opacities in the right mid lung and at both lung bases. No pleural effusion or pneumothorax. No acute osseous abnormality. IMPRESSION: Hazy bilateral airspace disease, concerning for COVID-19 pneumonia given clinical history. Electronically Signed    By: Titus Dubin M.D.   On: 02/13/2019 20:50    Scheduled Meds: . aspirin EC  81 mg Oral Daily  . atorvastatin  80 mg Oral Daily  . clopidogrel  75 mg Oral Daily  . dexamethasone (DECADRON) injection  6 mg Intravenous Daily  . heparin injection (subcutaneous)  5,000 Units Subcutaneous Q8H  . insulin aspart  0-20 Units Subcutaneous TID WC  . insulin aspart  0-5 Units Subcutaneous QHS  . insulin aspart  8 Units Subcutaneous TID WC  . insulin glargine  30 Units Subcutaneous BID   Continuous Infusions: . [START ON 02/15/2019] remdesivir 100 mg in NS 100 mL       LOS: 0 days   Marylu Lund, MD [facility] Pager On Amion  If 7PM-7AM, please contact night-coverage 02/14/2019, 2:04 PM

## 2019-02-14 NOTE — Progress Notes (Signed)
Received patient from [facility], patient setteleld in bed with help from [place]

## 2019-02-14 NOTE — ED Notes (Signed)
Attempted to call report at [facility] was placed on hold then transferred to a voicemail.

## 2019-02-14 NOTE — Progress Notes (Signed)
Pt settled in 118 room, RN to call for report from [facility], as no report was given. Admitting RN at bedside to connect patient to monitor , pt in no distress on 2Lpm [place], no need voiced. MD at bedside for admission.

## 2019-02-14 NOTE — ED Notes (Signed)
[facility] called and notified of need for transport to [facility]

## 2019-02-14 NOTE — H&P (Addendum)
History and Physical    Adrienne [name] [id number] DOB: [birth year] DOA: 02/13/2019  PCP: Charlott Rakes, MD  Patient coming from: Home.  Chief Complaint: Generalized body ache cough and shortness of breath.  HPI: Adrienne [name] is a 64 y.o. female with history of diabetes mellitus type 2, chronic kidney disease stage III, stroke with blindness and hypertension presents to the ER because of increasing weakness generalized body aches with nausea vomiting diarrhea ongoing for last 1 week.  Has been a nonproductive cough with shortness of breath.  Subjective feeling of fever and chills.  Patient states her family members have been tested positive for COVID-19.  ED Course: In the ER patient was hypoxic requiring 2 L oxygen to maintain sats.  Chest x-ray showed bilateral infiltrates.  Covid test came back positive.  Labs show creatinine of 2.39 which has worsened from recent past last month of 1.5.  LFTs were unremarkable.  Blood sugar was 336 CBC shows hemoglobin of 9.6 platelets 346 procalcitonin less than 0.10 ferritin 436 CRP 14.8.  EKG normal sinus rhythm.  Patient was given IV fluid bolus and admitted for acute respiratory failure with hypoxia secondary to COVID-19 infection.  Start patient on IV remdesivir and IV Decadron.  Review of Systems: As per HPI, rest all negative.   Past Medical History:  Diagnosis Date  . Diabetes ([place])   . High cholesterol   . Hypertension     Past Surgical History:  Procedure Laterality Date  . CATARACT EXTRACTION, BILATERAL     Laser eye surgery, cataract surgery bi-lat  . CESAREAN SECTION     [place], [place]  . EYE SURGERY Bilateral 2012   LASER, CATARACT  . TEE WITHOUT CARDIOVERSION N/A 12/31/2013   Procedure: TRANSESOPHAGEAL ECHOCARDIOGRAM (TEE);  Surgeon: Laverda Page, MD;  Location: [place];  Service: Cardiovascular;  Laterality: N/A;  H&P in file  . TUBAL LIGATION Bilateral      reports that she has quit smoking. She has  never used smokeless tobacco. She reports that she does not drink alcohol or use drugs.  Allergies  Allergen Reactions  . Codeine Itching  . Hydrocodone Itching  . Oxycodone Itching  . Penicillins Itching  . Clindamycin/Lincomycin Itching  . Niacin And Related Itching  . Penicillin G Itching  . Niacin Itching and Anxiety    Facial flushing    Family History  Problem Relation Age of Onset  . Dementia Mother   . Diabetes Father   . Hypertension Father   . Hypertension Sister   . Diabetes Sister   . Diabetes Sister   . Heart disease Sister     Prior to Admission medications   Medication Sig Start Date End Date Taking? Authorizing Provider  acetaminophen (TYLENOL) 500 MG tablet Take 500 mg by mouth every 6 (six) hours as needed for moderate pain.   Yes [provider]  aspirin EC 81 MG tablet Take 81 mg by mouth daily.    Yes [provider]  atorvastatin (LIPITOR) 80 MG tablet Take 1 tablet (80 mg total) by mouth daily. 12/18/18  Yes Charlott Rakes, MD  clopidogrel (PLAVIX) 75 MG tablet Take 1 tablet (75 mg total) by mouth daily. 12/18/18  Yes Charlott Rakes, MD  famotidine (PEPCID) 20 MG tablet Take 1 tablet (20 mg total) by mouth 2 (two) times daily. 01/03/18  Yes Charlott Rakes, MD  Insulin Glargine (LANTUS SOLOSTAR) 100 UNIT/ML Solostar Pen Inject 22 Units into the skin 2 (two) times daily. 12/18/18  Yes  Charlott Rakes, MD  losartan-hydrochlorothiazide (HYZAAR) 100-25 MG tablet Take 1 tablet by mouth daily. Dx: Hypertension 12/18/18  Yes Newlin, Charlane Ferretti, MD  metFORMIN (GLUCOPHAGE) 1000 MG tablet Take 1 tablet (1,000 mg total) by mouth 2 (two) times daily. 12/18/18  Yes Newlin, Charlane Ferretti, MD  Continuous Blood Gluc Sensor (FREESTYLE LIBRE 14 DAY SENSOR) MISC PLACE ONE SENSOR AND USE TO CHECK BLOOD SUGAR FOR 14 DAYS THEN REMOVE AND REPLACE. 01/21/19   Charlott Rakes, MD  Insulin Pen Needle 31G X 5 MM MISC 1 Act by Does not apply route 4 (four) times daily - after  meals and at bedtime. 12/28/18   Charlott Rakes, MD  Misc. Devices MISC Blood pressure monitor. Dx: Hypertension 12/18/18   Charlott Rakes, MD    Physical Exam: Constitutional: Moderately built and nourished. Vitals:   02/13/19 1932 02/13/19 1940 02/13/19 1949 02/13/19 1953  BP:  136/73    Pulse:  84    Resp:  (!) 21    Temp:  99.2 F (37.3 C)    TempSrc:  Oral    SpO2: (!) 84% 98% 99%   Weight:    88.5 kg  Height:    5\' 6"  (1.676 m)   Eyes: Anicteric no pallor. ENMT: No discharge from the ears eyes nose or mouth. Neck: No mass felt.  No neck rigidity. Respiratory: No rhonchi or crepitations. Cardiovascular: S1-S2 heard. Abdomen: Soft nontender bowel sounds present. Musculoskeletal: No edema.  No joint effusion. Skin: No rash. Neurologic: Alert awake oriented time place and person.  Moves all extremities. Psychiatric: Appears normal.   Labs on Admission: I have personally reviewed following labs and imaging studies  CBC: Recent Labs  Lab 02/13/19 2030  WBC 9.6  NEUTROABS 8.1*  HGB 9.6*  HCT 29.2*  MCV 93.0  PLT 123456   Basic Metabolic Panel: Recent Labs  Lab 02/13/19 2030  NA 132*  K 3.6  CL 94*  CO2 22  GLUCOSE 336*  BUN 54*  CREATININE 2.39*  CALCIUM 8.4*   GFR: Estimated Creatinine Clearance: 26.7 mL/min (A) (by C-G formula based on SCr of 2.39 mg/dL (H)). Liver Function Tests: Recent Labs  Lab 02/13/19 2030  AST 29  ALT 17  ALKPHOS 63  BILITOT 1.1  PROT 7.1  ALBUMIN 3.3*   No results for input(s): LIPASE, AMYLASE in the last 168 hours. No results for input(s): AMMONIA in the last 168 hours. Coagulation Profile: No results for input(s): INR, PROTIME in the last 168 hours. Cardiac Enzymes: No results for input(s): CKTOTAL, CKMB, CKMBINDEX, TROPONINI in the last 168 hours. BNP (last 3 results) No results for input(s): PROBNP in the last 8760 hours. HbA1C: No results for input(s): HGBA1C in the last 72 hours. CBG: Recent Labs  Lab  02/13/19 1950  GLUCAP 289*   Lipid Profile: Recent Labs    02/13/19 2030  TRIG 170*   Thyroid Function Tests: No results for input(s): TSH, T4TOTAL, FREET4, T3FREE, THYROIDAB in the last 72 hours. Anemia Panel: Recent Labs    02/13/19 2030  FERRITIN 436*   Urine analysis:    Component Value Date/Time   COLORURINE YELLOW 08/26/2018 0029   APPEARANCEUR CLOUDY (A) 08/26/2018 0029   LABSPEC 1.017 08/26/2018 0029   PHURINE 5.0 08/26/2018 0029   GLUCOSEU 50 (A) 08/26/2018 0029   HGBUR NEGATIVE 08/26/2018 0029   BILIRUBINUR NEGATIVE 08/26/2018 0029   KETONESUR NEGATIVE 08/26/2018 0029   PROTEINUR 30 (A) 08/26/2018 0029   NITRITE NEGATIVE 08/26/2018 0029   LEUKOCYTESUR NEGATIVE 08/26/2018  0029   Sepsis Labs: @LABRCNTIP (procalcitonin:4,lacticidven:4) ) Recent Results (from the past 240 hour(s))  Respiratory Panel by RT PCR (Flu A&B, Covid) - Nasopharyngeal Swab     Status: Abnormal   Collection Time: 02/13/19  8:15 PM   Specimen: Nasopharyngeal Swab  Result Value Ref Range Status   SARS Coronavirus 2 by RT PCR POSITIVE (A) NEGATIVE Final    Comment: CRITICAL RESULT CALLED TO, READ BACK BY AND VERIFIED WITH: RN Jordan Hawks AT [id number] 02/14/19 CRUICKSHANK A (NOTE) SARS-CoV-2 target nucleic acids are DETECTED. SARS-CoV-2 RNA is generally detectable in upper respiratory specimens  during the acute phase of infection. Positive results are indicative of the presence of the identified virus, but do not rule out bacterial infection or co-infection with other pathogens not detected by the test. Clinical correlation with patient history and other diagnostic information is necessary to determine patient infection status. The expected result is Negative. Fact Sheet for Patients:  PinkCheek.be Fact Sheet for Healthcare Providers: GravelBags.it This test is not yet approved or cleared by the [place] FDA and  has been  authorized for detection and/or diagnosis of SARS-CoV-2 by FDA under an Emergency Use Authorization (EUA).  This EUA will remain in effect (meaning this te st can be used) for the duration of  the COVID-19 declaration under Section 564(b)(1) of the Act, 21 U.S.C. section 360bbb-3(b)(1), unless the authorization is terminated or revoked sooner.    Influenza A by PCR NEGATIVE NEGATIVE Final   Influenza B by PCR NEGATIVE NEGATIVE Final    Comment: (NOTE) The Xpert Xpress SARS-CoV-2/FLU/RSV assay is intended as an aid in  the diagnosis of influenza from Nasopharyngeal swab specimens and  should not be used as a sole basis for treatment. Nasal washings and  aspirates are unacceptable for Xpert Xpress SARS-CoV-2/FLU/RSV  testing. Fact Sheet for Patients: PinkCheek.be Fact Sheet for Healthcare Providers: GravelBags.it This test is not yet approved or cleared by the [place] FDA and  has been authorized for detection and/or diagnosis of SARS-CoV-2 by  FDA under an Emergency Use Authorization (EUA). This EUA will remain  in effect (meaning this test can be used) for the duration of the  Covid-19 declaration under Section 564(b)(1) of the Act, 21  U.S.C. section 360bbb-3(b)(1), unless the authorization is  terminated or revoked. Performed at [facility], [place] [address] [place], [place] [address]      Radiological Exams on Admission: DG Chest Port 1 View  Result Date: 02/13/2019 CLINICAL DATA:  Cough and weakness.  COVID exposure. EXAM: PORTABLE CHEST 1 VIEW COMPARISON:  None. FINDINGS: The heart size and mediastinal contours are within normal limits. Normal pulmonary vascularity. Hazy patchy opacities in the right mid lung and at both lung bases. No pleural effusion or pneumothorax. No acute osseous abnormality. IMPRESSION: Hazy bilateral airspace disease, concerning for COVID-19 pneumonia given clinical  history. Electronically Signed   By: Titus Dubin M.D.   On: 02/13/2019 20:50    EKG: Independently reviewed.  Normal sinus rhythm with nonspecific changes.  Assessment/Plan Principal Problem:   Acute respiratory failure due to COVID-19 [facility]) Active Problems:   Blindness   ARF (acute renal failure) (HCC)   DM (diabetes mellitus), type 2 with renal complications ([place])    1. Acute respiratory failure with hypoxia secondary to COVID-19 infection for which I have started patient on IV remdesivir and IV Decadron.  Closely monitor respiratory status and inflammatory markers.  After discussing with patient about the off label use of Actemra  and its contraindication and side effects patient agrees to get it.  Discussed with pharmacy and ordered. 2. Diabetes mellitus type 2 with hyperglycemia -blood sugar is elevated with mildly elevated anion gap.  Will check a repeat metabolic panel to make sure patient is not going into DKA.  Patient presently on Lantus insulin twice daily with sliding scale coverage and Tradjenta was added per the protocol for Covid infection. 3. Acute on chronic kidney disease stage III likely from poor oral intake and prerenal causes.  For now I am holding off patient's ARB and hydrochlorothiazide.  We will gently hydrate.  Close monitor respiratory status intake output. 4. Hypertension since I am holding off ARB and hydrochlorothiazide due to renal failure will keep patient on as needed IV hydralazine and closely follow blood pressure trends. 5. Chronic anemia likely from renal disease follow CBC. 6. History of stroke with blindness on Lipitor and antiplatelet agents.  Given the acute respiratory failure with hypoxia with Covid infection with renal failure uncontrolled diabetes will need inpatient status.   DVT prophylaxis: Heparin. Code Status: DNR confirmed with patient. Family Communication: Discussed with patient. Disposition Plan: Home. Consults called:  None. Admission status: Inpatient.   Rise Patience MD [facility] Pager [phone].  If 7PM-7AM, please contact night-coverage [email or web address] Password [facility]  02/14/2019, 2:41 AM

## 2019-02-15 DIAGNOSIS — I639 Cerebral infarction, unspecified: Secondary | ICD-10-CM

## 2019-02-15 DIAGNOSIS — U071 COVID-19: Secondary | ICD-10-CM

## 2019-02-15 DIAGNOSIS — J1282 Pneumonia due to coronavirus disease 2019: Secondary | ICD-10-CM

## 2019-02-15 DIAGNOSIS — N183 Chronic kidney disease, stage 3 unspecified: Secondary | ICD-10-CM

## 2019-02-15 DIAGNOSIS — H543 Unqualified visual loss, both eyes: Secondary | ICD-10-CM

## 2019-02-15 LAB — COMPREHENSIVE METABOLIC PANEL
ALT: 14 U/L (ref 0–44)
AST: 23 U/L (ref 15–41)
Albumin: 3 g/dL — ABNORMAL LOW (ref 3.5–5.0)
Alkaline Phosphatase: 62 U/L (ref 38–126)
Anion gap: 13 (ref 5–15)
BUN: 69 mg/dL — ABNORMAL HIGH (ref 8–23)
CO2: 22 mmol/L (ref 22–32)
Calcium: 8.8 mg/dL — ABNORMAL LOW (ref 8.9–10.3)
Chloride: 99 mmol/L (ref 98–111)
Creatinine, Ser: 2.44 mg/dL — ABNORMAL HIGH (ref 0.44–1.00)
GFR calc Af Amer: 23 mL/min — ABNORMAL LOW (ref >60)
GFR calc non Af Amer: 20 mL/min — ABNORMAL LOW (ref >60)
Glucose, Bld: 366 mg/dL — ABNORMAL HIGH (ref 70–99)
Potassium: 4 mmol/L (ref 3.5–5.1)
Sodium: 134 mmol/L — ABNORMAL LOW (ref 135–145)
Total Bilirubin: 1.1 mg/dL (ref 0.3–1.2)
Total Protein: 7 g/dL (ref 6.5–8.1)

## 2019-02-15 LAB — FERRITIN: Ferritin: 673 ng/mL — ABNORMAL HIGH (ref 11–307)

## 2019-02-15 LAB — C-REACTIVE PROTEIN: CRP: 13 mg/dL — ABNORMAL HIGH (ref <1.0)

## 2019-02-15 LAB — GLUCOSE, CAPILLARY
Glucose-Capillary: 283 mg/dL — ABNORMAL HIGH (ref 70–99)
Glucose-Capillary: 316 mg/dL — ABNORMAL HIGH (ref 70–99)
Glucose-Capillary: 307 mg/dL — ABNORMAL HIGH (ref 70–99)
Glucose-Capillary: 300 mg/dL — ABNORMAL HIGH (ref 70–99)

## 2019-02-15 LAB — D-DIMER, QUANTITATIVE: D-Dimer, Quant: 0.88 ug/mL-FEU — ABNORMAL HIGH (ref 0.00–0.50)

## 2019-02-15 MED ORDER — ENOXAPARIN SODIUM 30 MG/0.3ML ~~LOC~~ SOLN
30.0000 mg | SUBCUTANEOUS | Status: DC
  Administered 2019-02-15 – 2019-02-16 (×2): 30 mg via SUBCUTANEOUS
  Filled 2019-02-15 (×3): qty 0.3

## 2019-02-15 MED ORDER — HYDROCOD POLST-CPM POLST ER 10-8 MG/5ML PO SUER: 5.0000 mL | Freq: Two times a day (BID) | ORAL | Status: DC

## 2019-02-15 MED ORDER — GUAIFENESIN-DM 100-10 MG/5ML PO SYRP
5.0000 mL | ORAL_SOLUTION | ORAL | Status: DC | PRN
  Administered 2019-02-15 – 2019-02-17 (×2): 5 mL via ORAL
  Filled 2019-02-15 (×2): qty 10

## 2019-02-15 NOTE — Progress Notes (Addendum)
PROGRESS NOTE    Adrienne [name]  [id number] DOB: 09-16-1955 DOA: 02/13/2019 PCP: Charlott Rakes, MD    Brief Narrative:  64 year old female who presented with dyspnea, cough and generalized weakness.  She has past medical history of blindness, diabetes mellitus mellitus, chronic kidney disease stage III and hypertension.  She reported 1 week of generalized weakness, nausea, vomiting and diarrhea.  Her symptoms progressed into a dry cough and dyspnea, associated with fevers and chills.  Multiple family members have tested positive for COVID-19.  On her initial physical examination her blood pressure was 136/73, heart rate 84, respiratory rate 21, temperature 99.2, oxygen saturation 84%, her lungs had no rales or rhonchi, heart S1-S2, present, abdomen was soft, no lower extremity edema. Sodium 132, potassium 3.6, chloride 94, bicarb 22, glucose 336, BUN 54, creatinine 2.39, white count 9.6, hemoglobin 9.6, hematocrit 39.2, platelets 346.  COVID-19 positive.  Chest radiograph with faint interstitial infiltrates, predominantly left lower lobe.  EKG 80 bpm, normal axis, normal intervals, sinus rhythm, poor R wave progression, no ST segment or T wave changes.  Patient was admitted to the hospital with a working diagnosis of acute hypoxic respiratory failure due to SARS COVID-19 viral pneumonia.    Assessment & Plan:   Principal Problem:   Acute respiratory failure due to COVID-19 [facility]) Active Problems:   Diabetes mellitus ([place])   Essential hypertension   Blindness   Acute respiratory failure with hypoxia (HCC)   ARF (acute renal failure) (HCC)   DM (diabetes mellitus), type 2 with renal complications (HCC)   Pneumonia due to COVID-19 virus   CKD (chronic kidney disease) stage 3, GFR 30-59 ml/min   CVA (cerebral vascular accident) ([place])   1. Acute hypoxic respiratory failure due to SARS COVID 19 viral pneumonia. #1 Tocilizumab 01/28.   RR: 19  Pulse oxymetry: 92%  Fi02: 3 L/ min  HFNC  COVID-19 Labs  Recent Labs    02/13/19 2030  DDIMER 1.09*  FERRITIN 436*  LDH 293*  CRP 14.8*    Lab Results  Component Value Date   SARSCOV2NAA POSITIVE (A) 02/13/2019    Continue medical therapy with Remdesivir #2/5, systemic corticosteroids, antitussive agents, bronchodilators and airway clearing techniques with flutter valve and incentive spirometer. Follow inflammatory markers  Out of bed to chair tid with meals, physical and occupational therapy evaluation.    2. Uncontrolled T2DM (Hgb A1c 9,1), with steroid induced hyperglycemia. Capillary glucose 283, patient tolerating po well, will continue glucose cover and monitoring with insulins sliding scale and basal insulin 20 bid glargine, pre-meal coverage with 8 units. Continue to calculate insulin requirements.   3. AKI on CKD stage 3b. Will follow with renal function today, avoid nephrotoxic medications and hypotension. Clinically euvolemic, hold on IV fluids for now.   4. HTN. Systolic blood pressure today 136, will continue to hold on antihypertensive medications, due to risk of hypotension.   5. Hx of CVA/ dyslipidemia.. Continue medical therapy with asa, clopidogrel and statin.   6. Chronic anemia. Multifactorial anemia, follow cell count as outpatient.   DVT prophylaxis: enoxaparin   Code Status:  dnr  Family Communication:  I spoke over the phone with the patient's daughter about patient's  condition, plan of care, prognosis and all questions were addressed. Disposition Plan/ discharge barriers: pending completion of antiviral regimen.     Subjective: Dyspnea has been improving but not yet back to baseline, no nausea or vomiting, no chest pain. Continue to have generalized weakness, and has not been out  of bed yet.  Objective: Vitals:   02/14/19 2000 02/15/19 0000 02/15/19 0400 02/15/19 0739  BP: 118/61 122/64 127/70 136/61  Pulse: 77 73 66 71  Resp: 18 (!) 22 16 19   Temp: 97.9 F (36.6 C) 98.1 F  (36.7 C) 98.4 F (36.9 C) 97.8 F (36.6 C)  TempSrc: Oral Oral Oral Oral  SpO2: 95% 91% 90% 92%  Weight:      Height:        Intake/Output Summary (Last 24 hours) at 02/15/2019 0805 Last data filed at 02/15/2019 0600 Gross per 24 hour  Intake 816.67 ml  Output 725 ml  Net 91.67 ml   Filed Weights   02/13/19 1953  Weight: 88.5 kg    Examination:   General: Not in pain or dyspnea. Deconditioned  Neurology: Awake and alert, non focal  E ENT: mild pallor, no icterus, oral mucosa moist Cardiovascular: No JVD. S1-S2 present, rhythmic, no gallops, rubs, or murmurs. No lower extremity edema. Pulmonary: positive breath sounds bilaterally. Gastrointestinal. Abdomen with no organomegaly, non tender, no rebound or guarding Skin. No rashes Musculoskeletal: no joint deformities     Data Reviewed: I have personally reviewed following labs and imaging studies  CBC: Recent Labs  Lab 02/13/19 2030  WBC 9.6  NEUTROABS 8.1*  HGB 9.6*  HCT 29.2*  MCV 93.0  PLT 123456   Basic Metabolic Panel: Recent Labs  Lab 02/13/19 2030  NA 132*  K 3.6  CL 94*  CO2 22  GLUCOSE 336*  BUN 54*  CREATININE 2.39*  CALCIUM 8.4*   GFR: Estimated Creatinine Clearance: 26.7 mL/min (A) (by C-G formula based on SCr of 2.39 mg/dL (H)). Liver Function Tests: Recent Labs  Lab 02/13/19 2030  AST 29  ALT 17  ALKPHOS 63  BILITOT 1.1  PROT 7.1  ALBUMIN 3.3*   No results for input(s): LIPASE, AMYLASE in the last 168 hours. No results for input(s): AMMONIA in the last 168 hours. Coagulation Profile: No results for input(s): INR, PROTIME in the last 168 hours. Cardiac Enzymes: No results for input(s): CKTOTAL, CKMB, CKMBINDEX, TROPONINI in the last 168 hours. BNP (last 3 results) No results for input(s): PROBNP in the last 8760 hours. HbA1C: No results for input(s): HGBA1C in the last 72 hours. CBG: Recent Labs  Lab 02/13/19 1950 02/14/19 0820 02/14/19 1148 02/14/19 1651 02/14/19 2139    GLUCAP 289* 378* 421* 374* 291*   Lipid Profile: Recent Labs    02/13/19 2030  TRIG 170*   Thyroid Function Tests: No results for input(s): TSH, T4TOTAL, FREET4, T3FREE, THYROIDAB in the last 72 hours. Anemia Panel: Recent Labs    02/13/19 2030  FERRITIN 436*      Radiology Studies: I have reviewed all of the imaging during this hospital visit personally     Scheduled Meds: . aspirin EC  81 mg Oral Daily  . atorvastatin  80 mg Oral Daily  . clopidogrel  75 mg Oral Daily  . dexamethasone (DECADRON) injection  6 mg Intravenous Daily  . heparin injection (subcutaneous)  5,000 Units Subcutaneous Q8H  . insulin aspart  0-20 Units Subcutaneous TID WC  . insulin aspart  0-5 Units Subcutaneous QHS  . insulin aspart  8 Units Subcutaneous TID WC  . insulin glargine  20 Units Subcutaneous BID   Continuous Infusions: . remdesivir 100 mg in NS 100 mL       LOS: 1 day        Jolin Benavides Gerome Apley, MD

## 2019-02-15 NOTE — Evaluation (Signed)
Physical Therapy Evaluation Patient Details Name: Adrienne [name] MRN: [id number] DOB: 08-01-1955 Today's Date: 02/15/2019   History of Present Illness  Pt is a 64 y.o. female admitted 02/13/19 with weakness, cough, dyspnea, nausea and vomiting, multiple family members tested (+) COVID-19; worked up for acute hypoxic respiratory failure due to SARS COVID-19 viral PNA. PMH includes blindness (from previous stroke), DM, CKD III, HTN.    Clinical Impression  Pt presents with an overall decrease in functional mobility secondary to above. PTA, pt mod independent with ambulation and ADLs, children assist with cooking and household tasks, pt lives with son. Today, pt requiring minA for standing mobility; limited by fatigue and c/o headache. Cough becoming productive with upright mobility and pt reports feeling better after this. SpO2 >/92% on 3L O2 [place]. Educ on precautions, positioning, IS/flutter valve use, and importance of mobility. Pt would benefit from continued acute PT services to maximize functional mobility and independence prior to d/c with HHPT services.     Follow Up Recommendations Home health PT;Supervision for mobility/OOB    Equipment Recommendations  None recommended by PT    Recommendations for Other Services       Precautions / Restrictions Precautions Precautions: Fall;Other (comment) Precaution Comments: Blind Restrictions Weight Bearing Restrictions: No      Mobility  Bed Mobility Overal bed mobility: Modified Independent                Transfers Overall transfer level: Needs assistance Equipment used: 1 person hand held assist Transfers: Sit to/from Stand Sit to Stand: Min assist            Ambulation/Gait Ambulation/Gait assistance: Min assist Gait Distance (Feet): 1 Feet Assistive device: 1 person hand held assist Gait Pattern/deviations: Step-to pattern;Trunk flexed     General Gait Details: Pivotal steps from bed to recliner with minA for  HHA; pt declining further mobility/exercise secondary to headache  Stairs            Wheelchair Mobility    Modified Rankin (Stroke Patients Only)       Balance Overall balance assessment: Needs assistance   Sitting balance-Leahy Scale: Good       Standing balance-Leahy Scale: Poor Standing balance comment: Reliant on UE support this session                             Pertinent Vitals/Pain Pain Assessment: Faces Pain Score: 8  Pain Location: Headache Pain Descriptors / Indicators: Headache Pain Intervention(s): Monitored during session;Patient requesting pain meds-RN notified;RN gave pain meds during session    Home Living Family/patient expects to be discharged to:: Private residence Living Arrangements: Children Available Help at Discharge: Family;Available PRN/intermittently Type of Home: House Home Access: Stairs to enter   [employer] of Steps: 2-3 Home Layout: Two level;Bed/bath upstairs Home Equipment: Other (comment);Bedside commode;Walker - 2 wheels(low vision cane) Additional Comments: Lives with 47 y.o. son Merrilee Seashore)    Prior Function Level of Independence: Needs assistance;Independent   Gait / Transfers Assistance Needed: Independent with household ambulation, uses furniture for guidance/support. Always has someone guide her with community ambulation  ADL's / Homemaking Assistance Needed: Independent with ADL tasks. Daughters/niece brings food for pt        Hand Dominance   Dominant Hand: Right    Extremity/Trunk Assessment   Upper Extremity Assessment Upper Extremity Assessment: Generalized weakness    Lower Extremity Assessment Lower Extremity Assessment: Generalized weakness       Communication  Communication: No difficulties  Cognition Arousal/Alertness: Awake/alert Behavior During Therapy: WFL for tasks assessed/performed Overall Cognitive Status: Within Functional Limits for tasks assessed                                  General Comments: WFL for simple tasks; not formally assessed      General Comments General comments (skin integrity, edema, etc.): Preparing for transfers to 3rd floor    Exercises Other Exercises Other Exercises: Educ on technique - 10x flutter valve, 10x incentive spirometer (consistently pulling ~538mL)   Assessment/Plan    PT Assessment Patient needs continued PT services  PT Problem List Decreased strength;Decreased activity tolerance;Decreased balance;Decreased mobility;Cardiopulmonary status limiting activity       PT Treatment Interventions DME instruction;Gait training;Stair training;Functional mobility training;Therapeutic activities;Therapeutic exercise;Balance training;Patient/family education    PT Goals (Current goals can be found in the Care Plan section)  Acute Rehab PT Goals Patient Stated Goal: Get rid of cough and headache; return home PT Goal Formulation: With patient/family Time For Goal Achievement: 03/01/19 Potential to Achieve Goals: Good    Frequency Min 3X/week   Barriers to discharge        Co-evaluation PT/OT/SLP Co-Evaluation/Treatment: Yes Reason for Co-Treatment: For patient/therapist safety;To address functional/ADL transfers PT goals addressed during session: Mobility/safety with mobility;Balance         AM-PAC PT "6 Clicks" Mobility  Outcome Measure Help needed turning from your back to your side while in a flat bed without using bedrails?: None Help needed moving from lying on your back to sitting on the side of a flat bed without using bedrails?: None Help needed moving to and from a bed to a chair (including a wheelchair)?: A Little Help needed standing up from a chair using your arms (e.g., wheelchair or bedside chair)?: A Little Help needed to walk in hospital room?: A Little Help needed climbing 3-5 steps with a railing? : A Little 6 Click Score: 20    End of Session   Activity Tolerance:  Patient tolerated treatment well;Patient limited by pain Patient left: in chair;with call bell/phone within reach;with chair alarm set Nurse Communication: Mobility status PT Visit Diagnosis: Other abnormalities of gait and mobility (R26.89);Muscle weakness (generalized) (M62.81)    Time: [id number] PT Time Calculation (min) (ACUTE ONLY): 44 min   Charges:   PT Evaluation $PT Eval Moderate Complexity: 1 Mod PT Treatments $Therapeutic Activity: 8-22 mins      Mabeline Caras, PT, DPT Acute Rehabilitation Services  Pager [phone] Office [place] 02/15/2019, 4:22 PM

## 2019-02-15 NOTE — Progress Notes (Signed)
Inpatient Diabetes Program Recommendations  AACE/ADA: New Consensus Statement on Inpatient Glycemic Control (2015)  Target Ranges:  Prepandial:   less than 140 mg/dL      Peak postprandial:   less than 180 mg/dL (1-2 hours)      Critically ill patients:  140 - 180 mg/dL   Lab Results  Component Value Date   GLUCAP 283 (H) 02/15/2019   HGBA1C 9.1 (A) 12/18/2018    Review of Glycemic Control Results for ZYAH, CHAPA (MRN [id number]) as of 02/15/2019 10:12  Ref. Range 02/14/2019 08:20 02/14/2019 11:48 02/14/2019 16:51 02/14/2019 21:39 02/15/2019 07:41  Glucose-Capillary Latest Ref Range: 70 - 99 mg/dL 378 (H) 421 (H) 374 (H) 291 (H) 283 (H)   Diabetes history: DM 2 Outpatient Diabetes medications: Lantus 22 units, Metformin 1000 mg bid Current orders for Inpatient glycemic control:  Lantus 20 units bid Novolog 0-20 units tid + hs Novolog 8 units tid meal coverage  Decadron 6 mg Daily  Inpatient Diabetes Program Recommendations:    -  Add Tradjenta 5 mg Daily (shown to reduce mortality in DM 2 pts with COVID article in COVID glycemic control order set)  -  Consider increasing Lantus to 26 units bid  Thanks, Tama Headings RN, MSN, BC-ADM Inpatient Diabetes Coordinator Team Pager [phone] (8a-5p)

## 2019-02-15 NOTE — Evaluation (Signed)
Occupational Therapy Evaluation Patient Details Name: Adrienne [name] MRN: [id number] DOB: 1955/07/23 Today's Date: 02/15/2019    History of Present Illness Pt is a 64 y.o. female admitted 02/13/19 with weakness, cough, dyspnea, nausea and vomiting, multiple family members tested (+) COVID-19; worked up for acute hypoxic respiratory failure due to SARS COVID-19 viral PNA. PMH includes blindness (from previous stroke), DM, CKD III, HTN.   Clinical Impression   PTA, pt was living with her son and was performing BADLs. Pt currently requiring Min-Max A for LB ADLs and Min A for functional transfers. Pt presenting with decreased balance, strength, and activity tolerance. Limited by headache and fatigue. VSS on 3L throughout session. Educating pt on use of IS/flutter valve. Pt would benefit from further acute OT to facilitate safe dc. Recommend dc to home with HHOT for further OT to optimize safety, independence with ADLs, and return to PLOF.      Follow Up Recommendations  Home health OT;Supervision/Assistance - 24 hour    Equipment Recommendations  None recommended by OT    Recommendations for Other Services PT consult     Precautions / Restrictions Precautions Precautions: Fall;Other (comment) Precaution Comments: Blind Restrictions Weight Bearing Restrictions: No      Mobility Bed Mobility Overal bed mobility: Modified Independent             General bed mobility comments: Increased time  Transfers Overall transfer level: Needs assistance Equipment used: 1 person hand held assist Transfers: Sit to/from Stand Sit to Stand: Min assist         General transfer comment: Min A for posterior lean    Balance Overall balance assessment: Needs assistance   Sitting balance-Leahy Scale: Good       Standing balance-Leahy Scale: Poor Standing balance comment: Reliant on UE support this session                           ADL either performed or assessed with  clinical judgement   ADL Overall ADL's : Needs assistance/impaired Eating/Feeding: Set up;Sitting Eating/Feeding Details (indicate cue type and reason): Cues for placement Grooming: Wash/dry face;Set up;Supervision/safety;Sitting   Upper Body Bathing: Set up;Supervision/ safety;Sitting   Lower Body Bathing: Minimal assistance;Sit to/from stand   Upper Body Dressing : Set up;Supervision/safety;Sitting   Lower Body Dressing: Maximal assistance;Sit to/from stand Lower Body Dressing Details (indicate cue type and reason): Max A for donning socks as pt is fatigued Armed forces technical officer: Minimal assistance;+2 for safety/equipment;Stand-pivot(simulated to recliner) Toilet Transfer Details (indicate cue type and reason): Min A for posterior lean         Functional mobility during ADLs: Minimal assistance(stand pivot only) General ADL Comments: Pt presenting with decreased acitivty tolerance      Vision Baseline Vision/History: Legally blind Patient Visual Report: No change from baseline Additional Comments: Reports "only sees black". Able to locate very bright loghts such as from window or overhead light. DOes not see shapes or shadows.     Perception     Praxis      Pertinent Vitals/Pain Pain Assessment: 0-10 Pain Score: 8  Pain Location: Headache Pain Descriptors / Indicators: Headache Pain Intervention(s): Monitored during session;RN gave pain meds during session     Hand Dominance Right   Extremity/Trunk Assessment Upper Extremity Assessment Upper Extremity Assessment: Generalized weakness   Lower Extremity Assessment Lower Extremity Assessment: Generalized weakness   Cervical / Trunk Assessment Cervical / Trunk Assessment: Normal   Communication Communication Communication: No difficulties  Cognition Arousal/Alertness: Awake/alert Behavior During Therapy: WFL for tasks assessed/performed Overall Cognitive Status: Within Functional Limits for tasks assessed                                  General Comments: WFL for simple tasks; not formally assessed   General Comments  SpO2 90s on 3L O2. Son calling during session and updated him on pt's progress with therapy    Exercises Exercises: Other exercises Other Exercises Other Exercises: Educ on technique - 10x flutter valve, 10x incentive spirometer (consistently pulling ~541mL)   Shoulder Instructions      Home Living Family/patient expects to be discharged to:: Private residence Living Arrangements: Children Available Help at Discharge: Family;Available PRN/intermittently Type of Home: House Home Access: Stairs to enter [employer] of Steps: 2-3   Home Layout: Two level;Bed/bath upstairs Alternate Level Stairs-Number of Steps: Flight Alternate Level Stairs-Rails: Right Bathroom Shower/Tub: Teacher, early years/pre: Standard     Home Equipment: Other (comment);Bedside commode;Walker - 2 wheels(low vision cane)   Additional Comments: Lives with 73 y.o. son Merrilee Seashore)      Prior Functioning/Environment Level of Independence: Needs assistance;Independent  Gait / Transfers Assistance Needed: Independent with household ambulation, uses furniture for guidance/support. Always has someone guide her with community ambulation ADL's / Homemaking Assistance Needed: Independent with ADL tasks. Daughters/niece brings food for pt            OT Problem List: Decreased strength;Decreased range of motion;Decreased activity tolerance;Impaired balance (sitting and/or standing);Impaired vision/perception;Decreased knowledge of use of DME or AE;Decreased knowledge of precautions;Cardiopulmonary status limiting activity      OT Treatment/Interventions: Self-care/ADL training;Therapeutic exercise;Energy conservation;DME and/or AE instruction;Therapeutic activities;Patient/family education    OT Goals(Current goals can be found in the care plan section) Acute Rehab OT  Goals Patient Stated Goal: Get rid of cough and headache; return home OT Goal Formulation: With patient Time For Goal Achievement: 03/01/19 Potential to Achieve Goals: Good  OT Frequency: Min 3X/week   Barriers to D/C:            Co-evaluation PT/OT/SLP Co-Evaluation/Treatment: Yes Reason for Co-Treatment: For patient/therapist safety(activity tolerance) PT goals addressed during session: Mobility/safety with mobility;Balance OT goals addressed during session: ADL's and self-care      AM-PAC OT "6 Clicks" Daily Activity     Outcome Measure Help from another person eating meals?: A Little Help from another person taking care of personal grooming?: A Little Help from another person toileting, which includes using toliet, bedpan, or urinal?: A Little Help from another person bathing (including washing, rinsing, drying)?: A Little Help from another person to put on and taking off regular upper body clothing?: None Help from another person to put on and taking off regular lower body clothing?: A Lot 6 Click Score: 18   End of Session Equipment Utilized During Treatment: Oxygen(3L) Nurse Communication: Mobility status  Activity Tolerance: Patient tolerated treatment well Patient left: in chair;with call bell/phone within reach  OT Visit Diagnosis: Unsteadiness on feet (R26.81);Other abnormalities of gait and mobility (R26.89);Muscle weakness (generalized) (M62.81)                Time: [id number] OT Time Calculation (min): 44 min Charges:  OT General Charges $OT Visit: 1 Visit OT Evaluation $OT Eval Moderate Complexity: [place], OTR/L Acute Rehab Pager: [phone] Office: [place] 02/15/2019, 5:12 PM

## 2019-02-15 NOTE — Progress Notes (Signed)
Called report to Delynn Flavin RB.   Rasheen RN came to transport patient via wheelchair to 306.   Patient transferred to 3rd floor room 306.

## 2019-02-16 DIAGNOSIS — J1282 Pneumonia due to coronavirus disease 2019: Secondary | ICD-10-CM

## 2019-02-16 DIAGNOSIS — J9601 Acute respiratory failure with hypoxia: Secondary | ICD-10-CM

## 2019-02-16 DIAGNOSIS — I10 Essential (primary) hypertension: Secondary | ICD-10-CM

## 2019-02-16 LAB — GLUCOSE, CAPILLARY
Glucose-Capillary: 317 mg/dL — ABNORMAL HIGH (ref 70–99)
Glucose-Capillary: 255 mg/dL — ABNORMAL HIGH (ref 70–99)
Glucose-Capillary: 208 mg/dL — ABNORMAL HIGH (ref 70–99)
Glucose-Capillary: 194 mg/dL — ABNORMAL HIGH (ref 70–99)

## 2019-02-16 LAB — BASIC METABOLIC PANEL
Anion gap: 14 (ref 5–15)
BUN: 73 mg/dL — ABNORMAL HIGH (ref 8–23)
CO2: 25 mmol/L (ref 22–32)
Calcium: 9.1 mg/dL (ref 8.9–10.3)
Chloride: 100 mmol/L (ref 98–111)
Creatinine, Ser: 2.36 mg/dL — ABNORMAL HIGH (ref 0.44–1.00)
GFR calc Af Amer: 24 mL/min — ABNORMAL LOW (ref >60)
GFR calc non Af Amer: 21 mL/min — ABNORMAL LOW (ref >60)
Glucose, Bld: 218 mg/dL — ABNORMAL HIGH (ref 70–99)
Potassium: 3.8 mmol/L (ref 3.5–5.1)
Sodium: 139 mmol/L (ref 135–145)

## 2019-02-16 MED ORDER — INSULIN GLARGINE 100 UNIT/ML ~~LOC~~ SOLN
10.0000 [IU] | Freq: Once | SUBCUTANEOUS | Status: AC
  Administered 2019-02-16: 10 [IU] via SUBCUTANEOUS
  Filled 2019-02-16: qty 0.1

## 2019-02-16 MED ORDER — INSULIN GLARGINE 100 UNIT/ML ~~LOC~~ SOLN
30.0000 [IU] | Freq: Two times a day (BID) | SUBCUTANEOUS | Status: DC
  Administered 2019-02-16: 30 [IU] via SUBCUTANEOUS
  Filled 2019-02-16 (×2): qty 0.3

## 2019-02-16 NOTE — Progress Notes (Addendum)
PROGRESS NOTE    Adrienne [name]  [id number] DOB: 1955-05-04 DOA: 02/13/2019 PCP: Charlott Rakes, MD    Brief Narrative:  64 year old female who presented with dyspnea, cough and generalized weakness.  She has past medical history of blindness, diabetes mellitus mellitus, chronic kidney disease stage III and hypertension.  She reported 1 week of generalized weakness, nausea, vomiting and diarrhea.  Her symptoms progressed into a dry cough and dyspnea, associated with fevers and chills.  Multiple family members have tested positive for COVID-19.  On her initial physical examination her blood pressure was 136/73, heart rate 84, respiratory rate 21, temperature 99.2, oxygen saturation 84%, her lungs had no rales or rhonchi, heart S1-S2, present, abdomen was soft, no lower extremity edema. Sodium 132, potassium 3.6, chloride 94, bicarb 22, glucose 336, BUN 54, creatinine 2.39, white count 9.6, hemoglobin 9.6, hematocrit 39.2, platelets 346.  COVID-19 positive.  Chest radiograph with faint interstitial infiltrates, predominantly left lower lobe.  EKG 80 bpm, normal axis, normal intervals, sinus rhythm, poor R wave progression, no ST segment or T wave changes.  Patient was admitted to the hospital with a working diagnosis of acute hypoxic respiratory failure due to SARS COVID-19 viral pneumonia.   Patient has been responding well to medical therapy with Remdesivir and systemic steroids.    Assessment & Plan:   Principal Problem:   Acute respiratory failure due to COVID-19 [facility]) Active Problems:   Diabetes mellitus ([place])   Essential hypertension   Blindness   Acute respiratory failure with hypoxia (HCC)   ARF (acute renal failure) (HCC)   DM (diabetes mellitus), type 2 with renal complications (HCC)   Pneumonia due to COVID-19 virus   CKD (chronic kidney disease) stage 3, GFR 30-59 ml/min   CVA (cerebral vascular accident) ([place])   1. Acute hypoxic respiratory failure due to SARS  COVID 19 viral pneumonia. #1 Tocilizumab 01/28.   RR: 20  Pulse oxymetry: 97%  Fi02: 3 L/ min per [place].   COVID-19 Labs  Recent Labs    02/13/19 2030 02/15/19 1007  DDIMER 1.09* 0.88*  FERRITIN 436* 673*  LDH 293*  --   CRP 14.8* 13.0*    Lab Results  Component Value Date   SARSCOV2NAA POSITIVE (A) 02/13/2019    Inflammatory markers trending down.   Tolerating well medical therapy with Remdesivir #3/5 (AST 23, ALT 14), continue systemic corticosteroids, antitussive agents, bronchodilators and airway clearing techniques with flutter valve and incentive spirometer.   Continue to encourage out of bed to chair tid with meals, physical and occupational therapy evaluation.    2. Uncontrolled T2DM (Hgb A1c 9,1), with steroid induced hyperglycemia. Patient has been  20 units bid of glargine along with pre-meal coverage 8 units and sliding scale insulin. Will continue to calculate requirements, continue capillary glucose monitoring.   Capillary 317 and 255. Has used 41 units of sliding scale yesterday, will increase to 30 units bid for now of basal insulin.   Patient is tolerating po well.   3. AKI on CKD stage 3b. Continue close follow up of renal function and electrolytes.  4. HTN. Blood pressure 139 to 145 mmHg, will resume lisinopril per home regimen.   5. Hx of CVA/ dyslipidemia.. On asa, clopidogrel and statin.   6. Chronic anemia. Multifactorial anemia.    DVT prophylaxis: enoxaparin   Code Status:  dnr  Family Communication:  no family at the bedside Disposition Plan/ discharge barriers: pending completion of antiviral regimen.    Subjective: Patient has been  transferred to the 3rd floor, her dyspnea continue to improved, no nausea or vomiting, no chest pain. Has been out of bed to chair with physical therapy.   Objective: Vitals:   02/15/19 2041 02/16/19 0438 02/16/19 0500 02/16/19 0800  BP: (!) 117/55 139/64  (!) 145/77  Pulse: 77 85  72  Resp: 20 18   20   Temp: 97.8 F (36.6 C) 97.8 F (36.6 C)  (!) 97.5 F (36.4 C)  TempSrc: Oral Oral  Oral  SpO2: 94% 91%  97%  Weight:   88.4 kg   Height:        Intake/Output Summary (Last 24 hours) at 02/16/2019 0905 Last data filed at 02/16/2019 0500 Gross per 24 hour  Intake 700 ml  Output 600 ml  Net 100 ml   Filed Weights   02/13/19 1953 02/16/19 0500  Weight: 88.5 kg 88.4 kg    Examination:   General: Not in pain or dyspnea, deconditioned  Neurology: Awake and alert, non focal  E ENT: mid  pallor, no icterus, oral mucosa moist Cardiovascular: No JVD. S1-S2 present, rhythmic, no gallops, rubs, or murmurs. No lower extremity edema. Pulmonary: positive breath sounds bilaterally. Gastrointestinal. Abdomen with no organomegaly, non tender, no rebound or guarding Skin. No rashes Musculoskeletal: no joint deformities     Data Reviewed: I have personally reviewed following labs and imaging studies  CBC: Recent Labs  Lab 02/13/19 2030  WBC 9.6  NEUTROABS 8.1*  HGB 9.6*  HCT 29.2*  MCV 93.0  PLT 123456   Basic Metabolic Panel: Recent Labs  Lab 02/13/19 2030 02/15/19 1007  NA 132* 134*  K 3.6 4.0  CL 94* 99  CO2 22 22  GLUCOSE 336* 366*  BUN 54* 69*  CREATININE 2.39* 2.44*  CALCIUM 8.4* 8.8*   GFR: Estimated Creatinine Clearance: 26.1 mL/min (A) (by C-G formula based on SCr of 2.44 mg/dL (H)). Liver Function Tests: Recent Labs  Lab 02/13/19 2030 02/15/19 1007  AST 29 23  ALT 17 14  ALKPHOS 63 62  BILITOT 1.1 1.1  PROT 7.1 7.0  ALBUMIN 3.3* 3.0*   No results for input(s): LIPASE, AMYLASE in the last 168 hours. No results for input(s): AMMONIA in the last 168 hours. Coagulation Profile: No results for input(s): INR, PROTIME in the last 168 hours. Cardiac Enzymes: No results for input(s): CKTOTAL, CKMB, CKMBINDEX, TROPONINI in the last 168 hours. BNP (last 3 results) No results for input(s): PROBNP in the last 8760 hours. HbA1C: No results for input(s):  HGBA1C in the last 72 hours. CBG: Recent Labs  Lab 02/14/19 2139 02/15/19 0741 02/15/19 1146 02/15/19 1626 02/15/19 2043  GLUCAP 291* 283* 316* 307* 300*   Lipid Profile: Recent Labs    02/13/19 2030  TRIG 170*   Thyroid Function Tests: No results for input(s): TSH, T4TOTAL, FREET4, T3FREE, THYROIDAB in the last 72 hours. Anemia Panel: Recent Labs    02/13/19 2030 02/15/19 1007  FERRITIN 436* 673*      Radiology Studies: I have reviewed all of the imaging during this hospital visit personally     Scheduled Meds: . aspirin EC  81 mg Oral Daily  . atorvastatin  80 mg Oral Daily  . clopidogrel  75 mg Oral Daily  . dexamethasone (DECADRON) injection  6 mg Intravenous Daily  . enoxaparin (LOVENOX) injection  30 mg Subcutaneous Q24H  . insulin aspart  0-20 Units Subcutaneous TID WC  . insulin aspart  0-5 Units Subcutaneous QHS  . insulin aspart  8 Units Subcutaneous TID WC  . insulin glargine  20 Units Subcutaneous BID   Continuous Infusions: . remdesivir 100 mg in NS 100 mL Stopped (02/15/19 1900)     LOS: 2 days        Darriona Dehaas Gerome Apley, MD

## 2019-02-17 DIAGNOSIS — Z794 Long term (current) use of insulin: Secondary | ICD-10-CM

## 2019-02-17 DIAGNOSIS — E113493 Type 2 diabetes mellitus with severe nonproliferative diabetic retinopathy without macular edema, bilateral: Secondary | ICD-10-CM

## 2019-02-17 LAB — COMPREHENSIVE METABOLIC PANEL
ALT: 14 U/L (ref 0–44)
AST: 24 U/L (ref 15–41)
Albumin: 3.2 g/dL — ABNORMAL LOW (ref 3.5–5.0)
Alkaline Phosphatase: 65 U/L (ref 38–126)
Anion gap: 14 (ref 5–15)
BUN: 73 mg/dL — ABNORMAL HIGH (ref 8–23)
CO2: 25 mmol/L (ref 22–32)
Calcium: 8.9 mg/dL (ref 8.9–10.3)
Chloride: 102 mmol/L (ref 98–111)
Creatinine, Ser: 2.52 mg/dL — ABNORMAL HIGH (ref 0.44–1.00)
GFR calc Af Amer: 23 mL/min — ABNORMAL LOW (ref >60)
GFR calc non Af Amer: 19 mL/min — ABNORMAL LOW (ref >60)
Glucose, Bld: 143 mg/dL — ABNORMAL HIGH (ref 70–99)
Potassium: 3.7 mmol/L (ref 3.5–5.1)
Sodium: 141 mmol/L (ref 135–145)
Total Bilirubin: 0.7 mg/dL (ref 0.3–1.2)
Total Protein: 6.7 g/dL (ref 6.5–8.1)

## 2019-02-17 LAB — GLUCOSE, CAPILLARY
Glucose-Capillary: 84 mg/dL (ref 70–99)
Glucose-Capillary: 112 mg/dL — ABNORMAL HIGH (ref 70–99)
Glucose-Capillary: 497 mg/dL — ABNORMAL HIGH (ref 70–99)
Glucose-Capillary: 351 mg/dL — ABNORMAL HIGH (ref 70–99)

## 2019-02-17 LAB — C-REACTIVE PROTEIN: CRP: 4.8 mg/dL — ABNORMAL HIGH (ref <1.0)

## 2019-02-17 LAB — FERRITIN: Ferritin: 448 ng/mL — ABNORMAL HIGH (ref 11–307)

## 2019-02-17 LAB — D-DIMER, QUANTITATIVE: D-Dimer, Quant: 0.63 ug/mL-FEU — ABNORMAL HIGH (ref 0.00–0.50)

## 2019-02-17 MED ORDER — INSULIN GLARGINE 100 UNIT/ML ~~LOC~~ SOLN
15.0000 [IU] | Freq: Once | SUBCUTANEOUS | Status: AC
  Administered 2019-02-17: 15 [IU] via SUBCUTANEOUS
  Filled 2019-02-17: qty 0.15

## 2019-02-17 MED ORDER — HEPARIN SODIUM (PORCINE) 5000 UNIT/ML IJ SOLN
5000.0000 [IU] | Freq: Three times a day (TID) | INTRAMUSCULAR | Status: DC
  Administered 2019-02-17 – 2019-02-18 (×3): 5000 [IU] via SUBCUTANEOUS
  Filled 2019-02-17 (×3): qty 1

## 2019-02-17 MED ORDER — INSULIN ASPART 100 UNIT/ML ~~LOC~~ SOLN
15.0000 [IU] | Freq: Once | SUBCUTANEOUS | Status: AC
  Administered 2019-02-17: 15 [IU] via SUBCUTANEOUS

## 2019-02-17 MED ORDER — INSULIN GLARGINE 100 UNIT/ML ~~LOC~~ SOLN
30.0000 [IU] | Freq: Every day | SUBCUTANEOUS | Status: DC
  Filled 2019-02-17: qty 0.3

## 2019-02-17 NOTE — Progress Notes (Addendum)
PROGRESS NOTE    Adrienne [name]  [id number] DOB: 02/28/1955 DOA: 02/13/2019 PCP: Charlott Rakes, MD    Brief Narrative:  64 year old female who presented with dyspnea, cough and generalized weakness. She has past medical history of blindness, diabetes mellitus mellitus, chronic kidney disease stage III and hypertension. She reported 1 week of generalized weakness, nausea, vomiting and diarrhea. Her symptoms progressed into a dry cough and dyspnea, associated with fevers and chills. Multiple family members have tested positive for COVID-19. On her initial physical examination her blood pressure was 136/73, heart rate 84, respiratory rate 21, temperature 99.2, oxygen saturation 84%, her lungs had no rales or rhonchi, heart S1-S2, present, abdomen was soft, no lower extremity edema. Sodium 132, potassium 3.6, chloride 94, bicarb 22, glucose 336, BUN 54, creatinine 2.39, white count 9.6, hemoglobin 9.6, hematocrit 39.2, platelets 346. COVID-19 positive. Chest radiograph with faint interstitial infiltrates, predominantly left lower lobe.EKG 80 bpm, normal axis, normal intervals, sinus rhythm, poor R wave progression, no ST segment or T wave changes.  Patient was admitted to the hospital with a working diagnosis of acute hypoxic respiratory failure due to SARS COVID-19 viral pneumonia.  Patient has been responding well to medical therapy with Remdesivir and systemic steroids   Assessment & Plan:   Principal Problem:   Acute respiratory failure due to COVID-19 [facility]) Active Problems:   Diabetes mellitus ([place])   Essential hypertension   Blindness   Acute respiratory failure with hypoxia (HCC)   ARF (acute renal failure) (HCC)   DM (diabetes mellitus), type 2 with renal complications ([place])   Pneumonia due to COVID-19 virus   CKD (chronic kidney disease) stage 3, GFR 30-59 ml/min   CVA (cerebral vascular accident) ([place])   1. Acute hypoxic respiratory failure due to SARS COVID  19 viral pneumonia.#1 Tocilizumab 01/28.  RR: 18  Pulse oxymetry: 94  Fi02: 2 L/ min per [place]  COVID-19 Labs  Recent Labs    02/15/19 1007 02/17/19 0232  DDIMER 0.88* 0.63*  FERRITIN 673* 448*  CRP 13.0* 4.8*    Lab Results  Component Value Date   SARSCOV2NAA POSITIVE (A) 02/13/2019    Inflammatory markers continue trending down.   Continue medical therapy with Remdesivir #4/5 (AST 24, ALT 14),  systemic corticosteroids with dexamethasone, antitussive agents, bronchodilators and airway clearing techniques with flutter valve and incentive spirometer.   Patient will need home health services at discharge.   2. Uncontrolled T2DM (Hgb A1c 9,1), with steroid induced hyperglycemia/ hypoglycemia. Fasting glucose this am at 143, capillary 84 and 112. Will decrease basal insulin to 30 units daily and will dc meal coverage, continue glucose cover and monitoring with insulin sliding scale.   3. AKI on CKD stage 3b. Renal function with serum cr at 2,52 with K at 3,7 and serum bicarbonate at 25. Will continue close monitoring of renal function and electrolytes. Avoid hypotension and nephrotoxic medications.   4. HTN. Blood pressure [id number] mmHg systolic this am. Continue to hold on lisinopril, for now to prevent hypotension.   5. Hx of CVA/ dyslipidemia.. Continue with dual antiplatelet therapy with asa and clopidogrel. Continue with statin.   6. Chronic anemia. Multifactorial anemia. Stable will need out patient follow up.   DVT prophylaxis:enoxaparin Code Status:dnr Family Communication:I spoke over the phone with the patient's son about patient's  condition, plan of care, prognosis and all questions were addressed. Disposition Plan/ discharge barriers:pending completion of antiviral regimen.    Subjective: Patient has been feeling better but not yet back  to baseline, no nausea or vomiting, tolerating po well.   Objective: Vitals:   02/16/19 2000 02/17/19 0400  02/17/19 0500 02/17/19 0712  BP: 119/67 125/74  115/64  Pulse: 74 70  72  Resp: 16 18  18   Temp: 98.3 F (36.8 C) 97.7 F (36.5 C)  97.8 F (36.6 C)  TempSrc: Oral Axillary  Oral  SpO2: 98% 98%  94%  Weight:   88.4 kg   Height:        Intake/Output Summary (Last 24 hours) at 02/17/2019 0843 Last data filed at 02/17/2019 0755 Gross per 24 hour  Intake 1200 ml  Output 3050 ml  Net -1850 ml   Filed Weights   02/13/19 1953 02/16/19 0500 02/17/19 0500  Weight: 88.5 kg 88.4 kg 88.4 kg    Examination:   General: Not in pain or dyspnea, deconditioned.  Neurology: Awake and alert, non focal  E ENT: no pallor, no icterus, oral mucosa moist Cardiovascular: No JVD. S1-S2 present, rhythmic, no gallops, rubs, or murmurs. No lower extremity edema. Pulmonary: positive breath sounds bilaterally, Gastrointestinal. Abdomen with no organomegaly, non tender, no rebound or guarding Skin. No rashes Musculoskeletal: no joint deformities     Data Reviewed: I have personally reviewed following labs and imaging studies  CBC: Recent Labs  Lab 02/13/19 2030  WBC 9.6  NEUTROABS 8.1*  HGB 9.6*  HCT 29.2*  MCV 93.0  PLT 123456   Basic Metabolic Panel: Recent Labs  Lab 02/13/19 2030 02/15/19 1007 02/16/19 1530 02/17/19 0232  NA 132* 134* 139 141  K 3.6 4.0 3.8 3.7  CL 94* 99 100 102  CO2 22 22 25 25   GLUCOSE 336* 366* 218* 143*  BUN 54* 69* 73* 73*  CREATININE 2.39* 2.44* 2.36* 2.52*  CALCIUM 8.4* 8.8* 9.1 8.9   GFR: Estimated Creatinine Clearance: 25.2 mL/min (A) (by C-G formula based on SCr of 2.52 mg/dL (H)). Liver Function Tests: Recent Labs  Lab 02/13/19 2030 02/15/19 1007 02/17/19 0232  AST 29 23 24   ALT 17 14 14   ALKPHOS 63 62 65  BILITOT 1.1 1.1 0.7  PROT 7.1 7.0 6.7  ALBUMIN 3.3* 3.0* 3.2*   No results for input(s): LIPASE, AMYLASE in the last 168 hours. No results for input(s): AMMONIA in the last 168 hours. Coagulation Profile: No results for input(s): INR,  PROTIME in the last 168 hours. Cardiac Enzymes: No results for input(s): CKTOTAL, CKMB, CKMBINDEX, TROPONINI in the last 168 hours. BNP (last 3 results) No results for input(s): PROBNP in the last 8760 hours. HbA1C: No results for input(s): HGBA1C in the last 72 hours. CBG: Recent Labs  Lab 02/16/19 0724 02/16/19 1123 02/16/19 1620 02/16/19 2153 02/17/19 0715  GLUCAP 317* 255* 208* 194* 84   Lipid Profile: No results for input(s): CHOL, HDL, LDLCALC, TRIG, CHOLHDL, LDLDIRECT in the last 72 hours. Thyroid Function Tests: No results for input(s): TSH, T4TOTAL, FREET4, T3FREE, THYROIDAB in the last 72 hours. Anemia Panel: Recent Labs    02/15/19 1007 02/17/19 0232  FERRITIN 673* 448*      Radiology Studies: I have reviewed all of the imaging during this hospital visit personally     Scheduled Meds: . aspirin EC  81 mg Oral Daily  . atorvastatin  80 mg Oral Daily  . clopidogrel  75 mg Oral Daily  . dexamethasone (DECADRON) injection  6 mg Intravenous Daily  . enoxaparin (LOVENOX) injection  30 mg Subcutaneous Q24H  . insulin aspart  0-20 Units Subcutaneous TID WC  .  insulin aspart  0-5 Units Subcutaneous QHS  . insulin aspart  8 Units Subcutaneous TID WC  . insulin glargine  30 Units Subcutaneous BID   Continuous Infusions: . remdesivir 100 mg in NS 100 mL Stopped (02/16/19 1212)     LOS: 3 days        Elvan Ebron Gerome Apley, MD

## 2019-02-17 NOTE — Plan of Care (Signed)
No acute events during this shift. Pt remains on 2-3 LNC with sats at 97-98%. VSS. No c/o SOB no c/o pain. VSS and WDL. Call bell within reach of pt, encouraged to use call bell for assistance.    Problem: Education: Goal: Knowledge of risk factors and measures for prevention of condition will improve Outcome: Progressing   Problem: Coping: Goal: Psychosocial and spiritual needs will be supported Outcome: Progressing   Problem: Respiratory: Goal: Will maintain a patent airway Outcome: Progressing Goal: Complications related to the disease process, condition or treatment will be avoided or minimized Outcome: Progressing   Problem: Education: Goal: Knowledge of General Education information will improve Description: Including pain rating scale, medication(s)/side effects and non-pharmacologic comfort measures Outcome: Progressing   Problem: Health Behavior/Discharge Planning: Goal: Ability to manage health-related needs will improve Outcome: Progressing   Problem: Clinical Measurements: Goal: Ability to maintain clinical measurements within normal limits will improve Outcome: Progressing Goal: Will remain free from infection Outcome: Progressing Goal: Diagnostic test results will improve Outcome: Progressing Goal: Respiratory complications will improve Outcome: Progressing Goal: Cardiovascular complication will be avoided Outcome: Progressing   Problem: Activity: Goal: Risk for activity intolerance will decrease Outcome: Progressing   Problem: Nutrition: Goal: Adequate nutrition will be maintained Outcome: Progressing   Problem: Coping: Goal: Level of anxiety will decrease Outcome: Progressing   Problem: Elimination: Goal: Will not experience complications related to bowel motility Outcome: Progressing Goal: Will not experience complications related to urinary retention Outcome: Progressing   Problem: Pain Managment: Goal: General experience of comfort will  improve Outcome: Progressing   Problem: Safety: Goal: Ability to remain free from injury will improve Outcome: Progressing   Problem: Skin Integrity: Goal: Risk for impaired skin integrity will decrease Outcome: Progressing   Problem: Education: Goal: Ability to describe self-care measures that may prevent or decrease complications (Diabetes Survival Skills Education) will improve Outcome: Progressing Goal: Individualized Educational Video(s) Outcome: Progressing   Problem: Coping: Goal: Ability to adjust to condition or change in health will improve Outcome: Progressing   Problem: Fluid Volume: Goal: Ability to maintain a balanced intake and output will improve Outcome: Progressing   Problem: Health Behavior/Discharge Planning: Goal: Ability to identify and utilize available resources and services will improve Outcome: Progressing Goal: Ability to manage health-related needs will improve Outcome: Progressing   Problem: Metabolic: Goal: Ability to maintain appropriate glucose levels will improve Outcome: Progressing   Problem: Nutritional: Goal: Maintenance of adequate nutrition will improve Outcome: Progressing Goal: Progress toward achieving an optimal weight will improve Outcome: Progressing   Problem: Skin Integrity: Goal: Risk for impaired skin integrity will decrease Outcome: Progressing   Problem: Tissue Perfusion: Goal: Adequacy of tissue perfusion will improve Outcome: Progressing

## 2019-02-18 LAB — GLUCOSE, CAPILLARY
Glucose-Capillary: 145 mg/dL — ABNORMAL HIGH (ref 70–99)
Glucose-Capillary: 72 mg/dL (ref 70–99)

## 2019-02-18 LAB — CULTURE, BLOOD (ROUTINE X 2)
Culture: NO GROWTH
Special Requests: ADEQUATE

## 2019-02-18 LAB — COMPREHENSIVE METABOLIC PANEL
ALT: 18 U/L (ref 0–44)
AST: 25 U/L (ref 15–41)
Albumin: 3.3 g/dL — ABNORMAL LOW (ref 3.5–5.0)
Alkaline Phosphatase: 65 U/L (ref 38–126)
Anion gap: 12 (ref 5–15)
BUN: 61 mg/dL — ABNORMAL HIGH (ref 8–23)
CO2: 26 mmol/L (ref 22–32)
Calcium: 8.7 mg/dL — ABNORMAL LOW (ref 8.9–10.3)
Chloride: 101 mmol/L (ref 98–111)
Creatinine, Ser: 2.04 mg/dL — ABNORMAL HIGH (ref 0.44–1.00)
GFR calc Af Amer: 29 mL/min — ABNORMAL LOW (ref >60)
GFR calc non Af Amer: 25 mL/min — ABNORMAL LOW (ref >60)
Glucose, Bld: 180 mg/dL — ABNORMAL HIGH (ref 70–99)
Potassium: 3.4 mmol/L — ABNORMAL LOW (ref 3.5–5.1)
Sodium: 139 mmol/L (ref 135–145)
Total Bilirubin: 0.7 mg/dL (ref 0.3–1.2)
Total Protein: 6.9 g/dL (ref 6.5–8.1)

## 2019-02-18 LAB — C-REACTIVE PROTEIN: CRP: 2.7 mg/dL — ABNORMAL HIGH (ref <1.0)

## 2019-02-18 LAB — FERRITIN: Ferritin: 349 ng/mL — ABNORMAL HIGH (ref 11–307)

## 2019-02-18 LAB — D-DIMER, QUANTITATIVE: D-Dimer, Quant: 0.79 ug/mL-FEU — ABNORMAL HIGH (ref 0.00–0.50)

## 2019-02-18 MED ORDER — CLOTRIMAZOLE 1 % VA CREA
1.0000 | TOPICAL_CREAM | Freq: Every day | VAGINAL | Status: DC
  Filled 2019-02-18: qty 45

## 2019-02-18 MED ORDER — CLOTRIMAZOLE 1 % VA CREA: 1.0000 | TOPICAL_CREAM | Freq: Every day | VAGINAL | 0 refills | Status: DC

## 2019-02-18 MED ORDER — GUAIFENESIN-DM 100-10 MG/5ML PO SYRP: 5.0000 mL | ORAL_SOLUTION | Freq: Four times a day (QID) | ORAL | 0 refills | Status: AC | PRN

## 2019-02-18 NOTE — Discharge Summary (Signed)
Physician Discharge Summary  Adrienne [name] [id number] DOB: [birth year] DOA: 02/13/2019  PCP: Charlott Rakes, MD  Admit date: 02/13/2019 Discharge date: 02/18/2019  Admitted From: Home  Disposition:  Home   Recommendations for Outpatient Follow-up and new medication changes:  1. Follow up with Dr. Margarita Rana in 2 weeks.  2. Continue quarantine for 2 weeks, use a mask in public and maintain physical distancing.   Home Health: no  Equipment/Devices:  No    Discharge Condition: stable CODE STATUS: full  Diet recommendation: heart healthy and diabetic prudent.   Brief/Interim Summary: 64 year old female who presented with dyspnea, cough and generalized weakness. She has past medical history of blindness, diabetes mellitus mellitus type 2, chronic kidney disease stage III and hypertension. She reported 1 week of generalized weakness, nausea, vomiting and diarrhea. Her symptoms progressed into a dry cough and dyspnea, associated with fevers and chills. Multiple family members have tested positive for COVID-19. On her initial physical examination her blood pressure was 136/73, heart rate 84, respiratory rate 21, temperature 99.2, oxygen saturation 84%, her lungs had no rales or rhonchi, heart S1-S2, present, abdomen was soft, no lower extremity edema. Sodium 132, potassium 3.6, chloride 94, bicarb 22, glucose 336, BUN 54, creatinine 2.39, white count 9.6, hemoglobin 9.6, hematocrit 39.2, platelets 346. COVID-19 positive. Chest radiograph with faint interstitial infiltrates, predominantly left lower lobe.EKG 80 bpm, normal axis, normal intervals, sinus rhythm, poor R wave progression, no ST segment or T wave changes.  Patient was admitted to the hospital with a working diagnosis of acute hypoxic respiratory failure due to SARS COVID-19 viral pneumonia.  Patient responded well to medical therapy with Remdesivir and systemic steroids.   1.  Acute hypoxic respiratory failure due to SARS  COVID-19 viral pneumonia.  Patient was admitted to the medical ward, she received medical therapy with remdesivir and dexamethasone.  1 dose of Tocilizumab on January 28.  She was treated with antitussive agents, bronchodilators and airway clearing techniques with flutter valve incentive spirometer.  She responded well to the medical therapy with improvement of her symptoms and inflammatory markers.  COVID-19 Labs  Recent Labs    02/15/19 1007 02/17/19 0232 02/18/19 0612 02/18/19 0613  DDIMER 0.88* 0.63* 0.79*  --   FERRITIN 673* 448*  --  349*  CRP 13.0* 4.8*  --  2.7*    Lab Results  Component Value Date   SARSCOV2NAA POSITIVE (A) 02/13/2019    At the time of discharge her oximetry was 98% on room air.  2.  Uncontrolled type 2 diabetes mellitus (hemoglobin 123456 9.1), complicated with steroid induced hyperglycemia.  Patient received insulin therapy, basal, meal coverage and sliding scale.  Her glucose was fluctuating, it got as low as 84 (hypoglycemia) on January 31.  On the day of discharge her fasting glucose was 180.  At home she will resume her diabetic regimen.  Steroids will be discontinued.  3.  Acute kidney injury on chronic kidney disease stage IIIb.  She had a close monitoring of her kidney function and electrolytes.  She received supportive medical therapy, her discharge creatinine is down to 2.0, potassium 3.4 and serum bicarbonate 26.  Close follow-up of kidney function as an outpatient.  4.  Hypertension.  Her antihypertensive agents were held during hospitalization, at discharge she will resume her antihypertensive regimen.  5.  History of CVA/dyslipidemia.  Patient was continued on dual antiplatelet therapy with aspirin, beta-blocker, continue statin therapy.  6.  Chronic anemia.  Multifactorial, her cell count remained stable, follow-up  as an outpatient.  Discharge Diagnoses:  Principal Problem:   Acute respiratory failure due to COVID-19 [facility]) Active Problems:    Diabetes mellitus ([place])   Essential hypertension   Blindness   Acute respiratory failure with hypoxia (HCC)   ARF (acute renal failure) (HCC)   DM (diabetes mellitus), type 2 with renal complications ([place])   Pneumonia due to COVID-19 virus   CKD (chronic kidney disease) stage 3, GFR 30-59 ml/min   CVA (cerebral vascular accident) [facility])    Discharge Instructions   Allergies as of 02/18/2019      Reactions   Codeine Itching   Hydrocodone Itching   Oxycodone Itching   Penicillins Itching   Clindamycin/lincomycin Itching   Niacin And Related Itching   Penicillin G Itching   Niacin Itching, Anxiety   Facial flushing      Medication List    TAKE these medications   acetaminophen 500 MG tablet Commonly known as: TYLENOL Take 500 mg by mouth every 6 (six) hours as needed for moderate pain.   aspirin EC 81 MG tablet Take 81 mg by mouth daily.   atorvastatin 80 MG tablet Commonly known as: LIPITOR Take 1 tablet (80 mg total) by mouth daily.   clopidogrel 75 MG tablet Commonly known as: PLAVIX Take 1 tablet (75 mg total) by mouth daily.   clotrimazole 1 % vaginal cream Commonly known as: GYNE-LOTRIMIN Place 1 Applicatorful vaginally at bedtime.   famotidine 20 MG tablet Commonly known as: Pepcid Take 1 tablet (20 mg total) by mouth 2 (two) times daily.   FreeStyle Libre 14 Day Sensor Misc PLACE ONE SENSOR AND USE TO CHECK BLOOD SUGAR FOR 14 DAYS THEN REMOVE AND REPLACE.   guaiFENesin-dextromethorphan 100-10 MG/5ML syrup Commonly known as: ROBITUSSIN DM Take 5 mLs by mouth every 6 (six) hours as needed for cough.   Insulin Pen Needle 31G X 5 MM Misc 1 Act by Does not apply route 4 (four) times daily - after meals and at bedtime.   Lantus SoloStar 100 UNIT/ML Solostar Pen Generic drug: Insulin Glargine Inject 22 Units into the skin 2 (two) times daily.   losartan-hydrochlorothiazide 100-25 MG tablet Commonly known as: HYZAAR Take 1 tablet by mouth daily. Dx:  Hypertension   metFORMIN 1000 MG tablet Commonly known as: GLUCOPHAGE Take 1 tablet (1,000 mg total) by mouth 2 (two) times daily.   Misc. Devices Misc Blood pressure monitor. Dx: Hypertension      Follow-up Information    Charlott Rakes, MD Follow up in 2 week(s).   Specialty: Family Medicine Contact information: [address] [place] [address] [phone]          Allergies  Allergen Reactions  . Codeine Itching  . Hydrocodone Itching  . Oxycodone Itching  . Penicillins Itching  . Clindamycin/Lincomycin Itching  . Niacin And Related Itching  . Penicillin G Itching  . Niacin Itching and Anxiety    Facial flushing    Consultations:     Procedures/Studies: DG Chest Port 1 View  Result Date: 02/13/2019 CLINICAL DATA:  Cough and weakness.  COVID exposure. EXAM: PORTABLE CHEST 1 VIEW COMPARISON:  None. FINDINGS: The heart size and mediastinal contours are within normal limits. Normal pulmonary vascularity. Hazy patchy opacities in the right mid lung and at both lung bases. No pleural effusion or pneumothorax. No acute osseous abnormality. IMPRESSION: Hazy bilateral airspace disease, concerning for COVID-19 pneumonia given clinical history. Electronically Signed   By: Titus Dubin M.D.   On: 02/13/2019  20:50      Procedures:   Subjective: Patient is feeling better, her dyspnea has been improving, no nausea or vomiting, no chest pain. Tolerating po well.   Discharge Exam: Vitals:   02/18/19 0505 02/18/19 0700  BP: 133/78 (!) 151/91  Pulse: 86 87  Resp: 20 18  Temp: 98.1 F (36.7 C) 97.8 F (36.6 C)  SpO2: 99% 98%   Vitals:   02/17/19 1950 02/18/19 0500 02/18/19 0505 02/18/19 0700  BP: 134/85  133/78 (!) 151/91  Pulse: 84  86 87  Resp: 20  20 18   Temp: 98.1 F (36.7 C)  98.1 F (36.7 C) 97.8 F (36.6 C)  TempSrc: Oral  Oral Oral  SpO2: 99%  99% 98%  Weight:  83 kg    Height:        General: Not in pain or dyspnea.  Neurology:  Awake and alert, non focal  E ENT: no pallor, no icterus, oral mucosa moist Cardiovascular: No JVD. S1-S2 present, rhythmic, no gallops, rubs, or murmurs. No lower extremity edema. Pulmonary: positive breath sounds bilaterally. Gastrointestinal. Abdomen with no organomegaly, non tender, no rebound or guarding Skin. No rashes Musculoskeletal: no joint deformities   The results of significant diagnostics from this hospitalization (including imaging, microbiology, ancillary and laboratory) are listed below for reference.     Microbiology: Recent Results (from the past 240 hour(s))  Respiratory Panel by RT PCR (Flu A&B, Covid) - Nasopharyngeal Swab     Status: Abnormal   Collection Time: 02/13/19  8:15 PM   Specimen: Nasopharyngeal Swab  Result Value Ref Range Status   SARS Coronavirus 2 by RT PCR POSITIVE (A) NEGATIVE Final    Comment: CRITICAL RESULT CALLED TO, READ BACK BY AND VERIFIED WITH: RN Jordan Hawks AT [id number] 02/14/19 CRUICKSHANK A (NOTE) SARS-CoV-2 target nucleic acids are DETECTED. SARS-CoV-2 RNA is generally detectable in upper respiratory specimens  during the acute phase of infection. Positive results are indicative of the presence of the identified virus, but do not rule out bacterial infection or co-infection with other pathogens not detected by the test. Clinical correlation with patient history and other diagnostic information is necessary to determine patient infection status. The expected result is Negative. Fact Sheet for Patients:  PinkCheek.be Fact Sheet for Healthcare Providers: GravelBags.it This test is not yet approved or cleared by the [place] FDA and  has been authorized for detection and/or diagnosis of SARS-CoV-2 by FDA under an Emergency Use Authorization (EUA).  This EUA will remain in effect (meaning this te st can be used) for the duration of  the COVID-19 declaration under Section  564(b)(1) of the Act, 21 U.S.C. section 360bbb-3(b)(1), unless the authorization is terminated or revoked sooner.    Influenza A by PCR NEGATIVE NEGATIVE Final   Influenza B by PCR NEGATIVE NEGATIVE Final    Comment: (NOTE) The Xpert Xpress SARS-CoV-2/FLU/RSV assay is intended as an aid in  the diagnosis of influenza from Nasopharyngeal swab specimens and  should not be used as a sole basis for treatment. Nasal washings and  aspirates are unacceptable for Xpert Xpress SARS-CoV-2/FLU/RSV  testing. Fact Sheet for Patients: PinkCheek.be Fact Sheet for Healthcare Providers: GravelBags.it This test is not yet approved or cleared by the [place] FDA and  has been authorized for detection and/or diagnosis of SARS-CoV-2 by  FDA under an Emergency Use Authorization (EUA). This EUA will remain  in effect (meaning this test can be used) for the duration of the  Covid-19 declaration under  Section 564(b)(1) of the Act, 21  U.S.C. section 360bbb-3(b)(1), unless the authorization is  terminated or revoked. Performed at [facility], [place] [address], [place], [place] [address]   Blood Culture (routine x 2)     Status: None (Preliminary result)   Collection Time: 02/13/19  8:22 PM   Specimen: BLOOD LEFT FOREARM  Result Value Ref Range Status   Specimen Description   Final    BLOOD LEFT FOREARM Performed at [facility], [place] [address], [place], [place] [address]    Special Requests   Final    BOTTLES DRAWN AEROBIC AND ANAEROBIC Blood Culture adequate volume Performed at [place] [address], [place], [place] [address]    Culture   Final    NO GROWTH 4 DAYS Performed at [facility], [place] [address], [place],  [address]    Report Status PENDING  Incomplete     Labs: BNP (last 3 results) No results for input(s): BNP in the last 8760 hours. Basic Metabolic  Panel: Recent Labs  Lab 02/13/19 2030 02/15/19 1007 02/16/19 1530 02/17/19 0232 02/18/19 0612  NA 132* 134* 139 141 139  K 3.6 4.0 3.8 3.7 3.4*  CL 94* 99 100 102 101  CO2 22 22 25 25 26   GLUCOSE 336* 366* 218* 143* 180*  BUN 54* 69* 73* 73* 61*  CREATININE 2.39* 2.44* 2.36* 2.52* 2.04*  CALCIUM 8.4* 8.8* 9.1 8.9 8.7*   Liver Function Tests: Recent Labs  Lab 02/13/19 2030 02/15/19 1007 02/17/19 0232 02/18/19 0612  AST 29 23 24 25   ALT 17 14 14 18   ALKPHOS 63 62 65 65  BILITOT 1.1 1.1 0.7 0.7  PROT 7.1 7.0 6.7 6.9  ALBUMIN 3.3* 3.0* 3.2* 3.3*   No results for input(s): LIPASE, AMYLASE in the last 168 hours. No results for input(s): AMMONIA in the last 168 hours. CBC: Recent Labs  Lab 02/13/19 2030  WBC 9.6  NEUTROABS 8.1*  HGB 9.6*  HCT 29.2*  MCV 93.0  PLT 346   Cardiac Enzymes: No results for input(s): CKTOTAL, CKMB, CKMBINDEX, TROPONINI in the last 168 hours. BNP: Invalid input(s): POCBNP CBG: Recent Labs  Lab 02/16/19 2153 02/17/19 0715 02/17/19 1121 02/17/19 1606 02/17/19 2147  GLUCAP 194* 84 112* 497* 351*   D-Dimer Recent Labs    02/17/19 0232 02/18/19 0612  DDIMER 0.63* 0.79*   Hgb A1c No results for input(s): HGBA1C in the last 72 hours. Lipid Profile No results for input(s): CHOL, HDL, LDLCALC, TRIG, CHOLHDL, LDLDIRECT in the last 72 hours. Thyroid function studies No results for input(s): TSH, T4TOTAL, T3FREE, THYROIDAB in the last 72 hours.  Invalid input(s): FREET3 Anemia work up Recent Labs    02/17/19 0232 02/18/19 0613  FERRITIN 448* 349*   Urinalysis    Component Value Date/Time   COLORURINE YELLOW 08/26/2018 0029   APPEARANCEUR CLOUDY (A) 08/26/2018 0029   LABSPEC 1.017 08/26/2018 0029   PHURINE 5.0 08/26/2018 0029   GLUCOSEU 50 (A) 08/26/2018 0029   HGBUR NEGATIVE 08/26/2018 0029   BILIRUBINUR NEGATIVE 08/26/2018 0029   KETONESUR NEGATIVE 08/26/2018 0029   PROTEINUR 30 (A) 08/26/2018 0029   NITRITE NEGATIVE  08/26/2018 0029   LEUKOCYTESUR NEGATIVE 08/26/2018 0029   Sepsis Labs Invalid input(s): PROCALCITONIN,  WBC,  LACTICIDVEN Microbiology Recent Results (from the past 240 hour(s))  Respiratory Panel by RT PCR (Flu A&B, Covid) - Nasopharyngeal Swab     Status: Abnormal   Collection Time:  02/13/19  8:15 PM   Specimen: Nasopharyngeal Swab  Result Value Ref Range Status   SARS Coronavirus 2 by RT PCR POSITIVE (A) NEGATIVE Final    Comment: CRITICAL RESULT CALLED TO, READ BACK BY AND VERIFIED WITH: RN Jordan Hawks AT [id number] 02/14/19 CRUICKSHANK A (NOTE) SARS-CoV-2 target nucleic acids are DETECTED. SARS-CoV-2 RNA is generally detectable in upper respiratory specimens  during the acute phase of infection. Positive results are indicative of the presence of the identified virus, but do not rule out bacterial infection or co-infection with other pathogens not detected by the test. Clinical correlation with patient history and other diagnostic information is necessary to determine patient infection status. The expected result is Negative. Fact Sheet for Patients:  PinkCheek.be Fact Sheet for Healthcare Providers: GravelBags.it This test is not yet approved or cleared by the [place] FDA and  has been authorized for detection and/or diagnosis of SARS-CoV-2 by FDA under an Emergency Use Authorization (EUA).  This EUA will remain in effect (meaning this te st can be used) for the duration of  the COVID-19 declaration under Section 564(b)(1) of the Act, 21 U.S.C. section 360bbb-3(b)(1), unless the authorization is terminated or revoked sooner.    Influenza A by PCR NEGATIVE NEGATIVE Final   Influenza B by PCR NEGATIVE NEGATIVE Final    Comment: (NOTE) The Xpert Xpress SARS-CoV-2/FLU/RSV assay is intended as an aid in  the diagnosis of influenza from Nasopharyngeal swab specimens and  should not be used as a sole basis for treatment.  Nasal washings and  aspirates are unacceptable for Xpert Xpress SARS-CoV-2/FLU/RSV  testing. Fact Sheet for Patients: PinkCheek.be Fact Sheet for Healthcare Providers: GravelBags.it This test is not yet approved or cleared by the [place] FDA and  has been authorized for detection and/or diagnosis of SARS-CoV-2 by  FDA under an Emergency Use Authorization (EUA). This EUA will remain  in effect (meaning this test can be used) for the duration of the  Covid-19 declaration under Section 564(b)(1) of the Act, 21  U.S.C. section 360bbb-3(b)(1), unless the authorization is  terminated or revoked. Performed at [facility], [place] [address], [place], [place] [address]   Blood Culture (routine x 2)     Status: None (Preliminary result)   Collection Time: 02/13/19  8:22 PM   Specimen: BLOOD LEFT FOREARM  Result Value Ref Range Status   Specimen Description   Final    BLOOD LEFT FOREARM Performed at [facility], [place] [address], [place], [place] [address]    Special Requests   Final    BOTTLES DRAWN AEROBIC AND ANAEROBIC Blood Culture adequate volume Performed at [place] [address] [place], [place] [address]    Culture   Final    NO GROWTH 4 DAYS Performed at [facility], [place] [address], [place], [place] [address]    Report Status PENDING  Incomplete     Time coordinating discharge: 45 minutes  SIGNED:   Tawni Millers, MD  [facility] 02/18/2019, 8:36 AM

## 2019-02-18 NOTE — Progress Notes (Signed)
SATURATION QUALIFICATIONS: (This note is used to comply with regulatory documentation for home oxygen)  Patient Saturations on Room Air at Rest = 95%  Patient Saturations on Room Air while Ambulating = 87%  Patient Saturations on 2 Liters of oxygen while Ambulating = 96%  Please briefly explain why patient needs home oxygen:to maintain saturation above 88%.[place] Pager [phone] Office [phone]

## 2019-02-18 NOTE — TOC Transition Note (Signed)
Transition of Care [facility]) - CM/SW Discharge Note   Patient Details  Name: Elouise Gallegos MRN: [id number] Date of Birth: [birth year]  Transition of Care [facility]) CM/SW Contact:  Atilano Median, LCSW Phone Number: 02/18/2019, 11:59 AM   Clinical Narrative:    Discharged home with home health services and oxygen. Portable tank to be delivered to patient's room by [facility] RN.   Oxygen concentrator will be delivered to patient's home. Unit RN aware to NOT discharge until confirmation of oxygen has been received.   Family will provide transport. No other needs at this time. Case closed to this CSW.    Final next level of care: Home w Home Health Services Barriers to Discharge: Barriers Resolved   Patient Goals and CMS Choice   CMS Medicare.gov Compare Post Acute Care list provided to:: Patient Choice offered to / list presented to : Patient  Discharge Placement                       Discharge Plan and Services                DME Arranged: Oxygen DME Agency: [place] Date DME Agency Contacted: 02/18/19 Time DME Agency Contacted: [phone] Representative spoke with at DME Agency: Magda Paganini HH Arranged: PT [place]: [place] Date [place]: 02/18/19 Time [place]: 1159 Representative spoke with at [place]: [place] Determinants of Health ([place]) Interventions     Readmission Risk Interventions No flowsheet data found.

## 2019-02-18 NOTE — Progress Notes (Signed)
Physical Therapy Treatment Patient Details Name: Adrienne [name] MRN: [id number] DOB: [birth year] Today's Date: 02/18/2019    History of Present Illness Pt is a 64 y.o. female admitted 02/13/19 with weakness, cough, dyspnea, nausea and vomiting, multiple family members tested (+) COVID-19; worked up for acute hypoxic respiratory failure due to SARS COVID-19 viral PNA. PMH includes blindness (from previous stroke), DM, CKD III, HTN.    PT Comments    The patient expresses desire to go home ASAP. Patient did drop in saturation  To 87% on RA. Patient did say" I don't need oxygen". RN aware of oxygen and MD aware. Plans are for DC today.   Follow Up Recommendations  Home health PT;Supervision for mobility/OOB     Equipment Recommendations  None recommended by PT    Recommendations for Other Services       Precautions / Restrictions Precautions Precautions: Fall;Other (comment) Precaution Comments: Blind, watch sats    Mobility  Bed Mobility Overal bed mobility: Modified Independent             General bed mobility comments: Increased time  Transfers Overall transfer level: Needs assistance Equipment used: 1 person hand held assist Transfers: Sit to/from Stand Sit to Stand: Min assist         General transfer comment: tactile verbal cues for reaching to RW  Ambulation/Gait Ambulation/Gait assistance: Min assist Gait Distance (Feet): 6 Feet Assistive device: Rolling walker (2 wheeled) Gait Pattern/deviations: Step-to pattern Gait velocity: decr   General Gait Details: Verbal/tacctile cues for ambulation with RW.   Stairs             Wheelchair Mobility    Modified Rankin (Stroke Patients Only)       Balance   Sitting-balance support: No upper extremity supported;Feet supported Sitting balance-Leahy Scale: Good     Standing balance support: During functional activity;Bilateral upper extremity supported Standing balance-Leahy Scale:  Fair Standing balance comment: Reliant on UE support this session                            Cognition   Behavior During Therapy: WFL for tasks assessed/performed;Restless(wants to go ASAP.)                                   General Comments: [facility] for simple tasks; not formally assessed      Exercises Other Exercises Other Exercises: Educ on technique - 10x flutter valve, 10x incentive spirometer    General Comments        Pertinent Vitals/Pain Pain Assessment: No/denies pain    Home Living                      Prior Function            PT Goals (current goals can now be found in the care plan section) Progress towards PT goals: Progressing toward goals    Frequency    Min 3X/week      PT Plan Current plan remains appropriate    Co-evaluation              AM-PAC PT "6 Clicks" Mobility   Outcome Measure  Help needed turning from your back to your side while in a flat bed without using bedrails?: None Help needed moving from lying on your back to sitting on the side of a flat bed without using  bedrails?: None Help needed moving to and from a bed to a chair (including a wheelchair)?: A Little Help needed standing up from a chair using your arms (e.g., wheelchair or bedside chair)?: A Little Help needed to walk in hospital room?: A Little Help needed climbing 3-5 steps with a railing? : A Lot 6 Click Score: 19    End of Session   Activity Tolerance: Patient tolerated treatment well Patient left: in chair;with call bell/phone within reach;with chair alarm set Nurse Communication: Mobility status PT Visit Diagnosis: Other abnormalities of gait and mobility (R26.89);Muscle weakness (generalized) (M62.81)     Time: [id number] PT Time Calculation (min) (ACUTE ONLY): 46 min  Charges:  $Gait Training: 8-22 mins $Therapeutic Activity: 8-22 mins $Self Care/Home Management: [place] Pager [phone] Office [phone]    Claretha Cooper 02/18/2019, 1:22 PM

## 2019-02-18 NOTE — Care Management Important Message (Signed)
Important Message  Patient Details  Name: Maridel Davin MRN: [id number] Date of Birth: 03-10-1955   Medicare Important Message Given:  Yes - Important Message mailed due to current National Emergency  Verbal consent obtained due to current National Emergency  Relationship to patient: Child Contact Name: Haydee Monica Call Date: 02/18/19  Time: 1246 Phone: [phone] Outcome: Spoke with contact Important Message mailed to: Emergency contact on file    Delorse Lek 02/18/2019, 12:46 PM

## 2019-02-18 NOTE — Progress Notes (Signed)
Patient discharged today. Reviewed AVS Discharge Instructions with patient and her daughter Waymon Amato. Patient verbalized understanding. Patient given copy of AVS instructions. IV access  Removed with catheter intact. Pt left in private vehicle in care of daughter.

## 2019-02-20 ENCOUNTER — Telehealth: Payer: Self-pay

## 2019-02-20 NOTE — Telephone Encounter (Signed)
Transition Care Management Follow-up Telephone Call Date of discharge and from where: 02/18/2019, [facility]   Call placed to patient # [phone], message left with call back requested to this CM

## 2019-02-21 ENCOUNTER — Telehealth: Payer: Self-pay

## 2019-02-21 ENCOUNTER — Other Ambulatory Visit: Payer: Self-pay

## 2019-02-21 NOTE — Consult Note (Signed)
Referral for Benefit check for [facility].  Patient is currently showing a change in insurance coverage to [employer] as of 01/18/2019 verified in EMR/EPIC.  She is on the roster for [employer], awaiting updated APL for [facility] roster.  Natividad Brood, RN BSN [facility] Liaison  [phone] business mobile phone Toll free office [phone]  Fax number: [phone] [email or web address]

## 2019-02-21 NOTE — Telephone Encounter (Signed)
From the discharge call.  Patient had no complaints/questions.  Said she is feeling better. Using O2 prn.  Has appt with Dr Margarita Rana 03/18/2019 and did not want to schedule an appt any sooner.

## 2019-02-21 NOTE — Telephone Encounter (Signed)
Transition Care Management Follow-up Telephone Call  Date of discharge and from where: 02/18/2019, [facility]   How have you been since you were released from the hospital? She stated that she is " doing better now."   Any questions or concerns? No questions/complaints reported   Items Reviewed:  Did the pt receive and understand the discharge instructions provided? She said that her son has them.   No questions at this time  Medications obtained and verified? She said she has her medications and has been taking them. When asked about the instructions, she said that the home health nurse who is coming today will review them.  Instructed her to have the nurse call the clinic if she has any questions.   Any new allergies since your discharge? None reported   Do you have support at home? Her son   Other (ie: DME, [place], etc) has home O2 from [place]. stated that she uses it as needed.   Home health ordered through [facility]. Patient said the nurse is coming today.   She stated that she understands that she needs to continue to quarantine for 2 weeks- per discharge summary.   Has free style libre. Says her blood sugars have been " up and down."    Functional Questionnaire: (I = Independent and D = Dependent) ADL's:family provides needed assistance with ADLS and ambulation. Patient is blind.    Follow up appointments reviewed:    PCP Hospital f/u appt confirmed? Has appointment with Dr Margarita Rana 03/18/2019 and did not want to schedule an earlier appt. Marland Kitchen  [facility] f/u appt confirmed? None scheduled at this time  Are transportation arrangements needed? Not addressed  If their condition worsens, is the pt aware to call  their PCP or go to the ED?yes  Was the patient provided with contact information for the PCP's office or ED? She has the clinic phone number  Was the pt encouraged to call back with questions or concerns? yes

## 2019-02-21 NOTE — Telephone Encounter (Signed)
Transition Care Management Follow-up Telephone Call Date of discharge and from where: 02/18/2019, [facility]   Call placed to patient # [phone], message left with call back requested to this CM   Pt needs to have a hospital follow up televisit scheduled

## 2019-02-22 ENCOUNTER — Other Ambulatory Visit: Payer: Self-pay | Admitting: *Deleted

## 2019-02-22 NOTE — Patient Outreach (Signed)
Telephone outreach to address referral from [facility] for community resources and diabeted medication management.  No answer, phone voice mail box was full. I texted pt cell providing her with my contact and credentials and requested a call back from her on Monday.  [name]. [name], MSN, [facility] Gerontological Nurse Practitioner [facility] Care Management [phone]

## 2019-02-24 DIAGNOSIS — Z794 Long term (current) use of insulin: Secondary | ICD-10-CM | POA: Diagnosis not present

## 2019-02-24 DIAGNOSIS — D631 Anemia in chronic kidney disease: Secondary | ICD-10-CM | POA: Diagnosis not present

## 2019-02-24 DIAGNOSIS — Z8673 Personal history of transient ischemic attack (TIA), and cerebral infarction without residual deficits: Secondary | ICD-10-CM | POA: Diagnosis not present

## 2019-02-24 DIAGNOSIS — J9601 Acute respiratory failure with hypoxia: Secondary | ICD-10-CM | POA: Diagnosis not present

## 2019-02-24 DIAGNOSIS — E1143 Type 2 diabetes mellitus with diabetic autonomic (poly)neuropathy: Secondary | ICD-10-CM | POA: Diagnosis not present

## 2019-02-24 DIAGNOSIS — E113413 Type 2 diabetes mellitus with severe nonproliferative diabetic retinopathy with macular edema, bilateral: Secondary | ICD-10-CM | POA: Diagnosis not present

## 2019-02-24 DIAGNOSIS — H547 Unspecified visual loss: Secondary | ICD-10-CM | POA: Diagnosis not present

## 2019-02-24 DIAGNOSIS — N1832 Chronic kidney disease, stage 3b: Secondary | ICD-10-CM | POA: Diagnosis not present

## 2019-02-24 DIAGNOSIS — I129 Hypertensive chronic kidney disease with stage 1 through stage 4 chronic kidney disease, or unspecified chronic kidney disease: Secondary | ICD-10-CM | POA: Diagnosis not present

## 2019-02-24 DIAGNOSIS — E1122 Type 2 diabetes mellitus with diabetic chronic kidney disease: Secondary | ICD-10-CM | POA: Diagnosis not present

## 2019-02-25 ENCOUNTER — Other Ambulatory Visit: Payer: Self-pay | Admitting: *Deleted

## 2019-02-25 DIAGNOSIS — I129 Hypertensive chronic kidney disease with stage 1 through stage 4 chronic kidney disease, or unspecified chronic kidney disease: Secondary | ICD-10-CM | POA: Diagnosis not present

## 2019-02-25 DIAGNOSIS — D631 Anemia in chronic kidney disease: Secondary | ICD-10-CM | POA: Diagnosis not present

## 2019-02-25 DIAGNOSIS — E113413 Type 2 diabetes mellitus with severe nonproliferative diabetic retinopathy with macular edema, bilateral: Secondary | ICD-10-CM | POA: Diagnosis not present

## 2019-02-25 DIAGNOSIS — E1143 Type 2 diabetes mellitus with diabetic autonomic (poly)neuropathy: Secondary | ICD-10-CM | POA: Diagnosis not present

## 2019-02-25 DIAGNOSIS — J9601 Acute respiratory failure with hypoxia: Secondary | ICD-10-CM | POA: Diagnosis not present

## 2019-02-25 DIAGNOSIS — E1122 Type 2 diabetes mellitus with diabetic chronic kidney disease: Secondary | ICD-10-CM | POA: Diagnosis not present

## 2019-02-25 DIAGNOSIS — Z8673 Personal history of transient ischemic attack (TIA), and cerebral infarction without residual deficits: Secondary | ICD-10-CM | POA: Diagnosis not present

## 2019-02-25 DIAGNOSIS — Z794 Long term (current) use of insulin: Secondary | ICD-10-CM | POA: Diagnosis not present

## 2019-02-25 DIAGNOSIS — N1832 Chronic kidney disease, stage 3b: Secondary | ICD-10-CM | POA: Diagnosis not present

## 2019-02-25 DIAGNOSIS — H547 Unspecified visual loss: Secondary | ICD-10-CM | POA: Diagnosis not present

## 2019-02-26 ENCOUNTER — Other Ambulatory Visit: Payer: Self-pay | Admitting: *Deleted

## 2019-02-26 ENCOUNTER — Encounter: Payer: Self-pay | Admitting: *Deleted

## 2019-02-26 DIAGNOSIS — N1832 Chronic kidney disease, stage 3b: Secondary | ICD-10-CM | POA: Diagnosis not present

## 2019-02-26 DIAGNOSIS — E1122 Type 2 diabetes mellitus with diabetic chronic kidney disease: Secondary | ICD-10-CM | POA: Diagnosis not present

## 2019-02-26 DIAGNOSIS — Z794 Long term (current) use of insulin: Secondary | ICD-10-CM | POA: Diagnosis not present

## 2019-02-26 DIAGNOSIS — I129 Hypertensive chronic kidney disease with stage 1 through stage 4 chronic kidney disease, or unspecified chronic kidney disease: Secondary | ICD-10-CM | POA: Diagnosis not present

## 2019-02-26 DIAGNOSIS — J9601 Acute respiratory failure with hypoxia: Secondary | ICD-10-CM | POA: Diagnosis not present

## 2019-02-26 DIAGNOSIS — E113413 Type 2 diabetes mellitus with severe nonproliferative diabetic retinopathy with macular edema, bilateral: Secondary | ICD-10-CM | POA: Diagnosis not present

## 2019-02-26 DIAGNOSIS — E1143 Type 2 diabetes mellitus with diabetic autonomic (poly)neuropathy: Secondary | ICD-10-CM | POA: Diagnosis not present

## 2019-02-26 DIAGNOSIS — H547 Unspecified visual loss: Secondary | ICD-10-CM | POA: Diagnosis not present

## 2019-02-26 DIAGNOSIS — D631 Anemia in chronic kidney disease: Secondary | ICD-10-CM | POA: Diagnosis not present

## 2019-02-26 DIAGNOSIS — Z8673 Personal history of transient ischemic attack (TIA), and cerebral infarction without residual deficits: Secondary | ICD-10-CM | POA: Diagnosis not present

## 2019-02-26 NOTE — Patient Outreach (Signed)
[facility] [facility]) Care Management  02/26/2019  Adrienne [name] 1955-12-02 [id number]   CSW made an initial attempt to try and contact patient today to perform the initial phone assessment, as well as assess and assist with social work needs and services, without success.  CSW was unable to leave a HIPAA compliant message on voicemail for patient, receiving an automated recording that patient's mailbox is full and unable to accept new messages at this time.  CSW also tried contacting patient's son, [name], with whom patient currently resides, but [name] was also unavailable at the time of CSW's call.  CSW was able to leave a HIPAA compliant message for [name], and is currently awaiting a return call.  CSW will make a second outreach attempt within the next 3-4 business days, on Friday, March 01, 2019, around 9:00am, if a return call is not received from patient or [name] in the meantime.  CSW will also mail an Outreach Letter to patient's home requesting that patient contact CSW if she is interested in receiving social work services and resources through [place] with Scientist, clinical (histocompatibility and immunogenetics).  [name], BSW, MSW, LCSW  Licensed Education officer, environmental Health System  Mailing [place] [address], [place], [place] [address] Physical Address-[address], [place], [place] [address] Toll Free Main # [phone] Fax # [phone] Cell # [phone]  Office # [phone] [name]

## 2019-02-27 ENCOUNTER — Encounter: Payer: Self-pay | Admitting: *Deleted

## 2019-02-27 ENCOUNTER — Telehealth: Payer: Self-pay | Admitting: Family Medicine

## 2019-02-27 NOTE — Telephone Encounter (Signed)
Mickel Baas from [facility] called saying that they went out for the home health assessment for PT and are requesting 2 times a week for 4 weeks and 1 time a week for 2 weeks. It's ok to leave a voice message.

## 2019-02-27 NOTE — Patient Outreach (Signed)
[facility]) Care Management  02/27/2019  Adrienne [name] July 31, 1955 [id number]  NP and LCSW have made sereral attempts to engage pt. She is receiving servies at this time from 2 other servcies. Neither NP or LCSW has been able to start our case for one reason of anyother (pt voicemail is full and can't record messages, not a good time to talk as she is with other providers.)  LCSW and I have conferred and decided that we will not be active at this time but pending and be ready to engage when the serving agencies are ready to close.  Letter written and being sent to Adrienne [name], that we are available and will be on stand by to serve her.  [name]. [name], MSN, [facility] Gerontological Nurse Practitioner [facility] Care Management [phone]

## 2019-02-27 NOTE — Patient Outreach (Signed)
Unsuccessful or incomplete calls have been made by [employer] LCSW and myself to engage Ms. Zagal. Many other services are in attendance at this time and providing services. We will change our status from Acive to Pending until a time comes when other services are ready to close and report off to [place] for continued care management.  I am sending Mrs. Badeaux a letter to this affect today.  [name]. [name], MSN, [facility] Gerontological Nurse Practitioner [facility] Care Management [phone]

## 2019-02-27 NOTE — Telephone Encounter (Signed)
Adrienne [name] was called and given verbal orders for the patient

## 2019-03-01 ENCOUNTER — Other Ambulatory Visit: Payer: Self-pay | Admitting: *Deleted

## 2019-03-01 NOTE — Patient Outreach (Signed)
[facility] [facility]) Care Management  03/01/2019  Adrienne [name] [birth year] [id number]   CSW made a second attempt to try and contact patient and patient's son, Adrienne [name] today to perform the initial phone assessment, as well as assess and assist with social work needs and services; however, neither were available at the time of CSW's call.  CSW left a HIPAA compliant message for patient on voicemail and continues to await a return call.  CSW was unable to leave a message for [name], as his phone just continued to ring, with no answer.  CSW will make a third and final outreach attempt within the next 3-4 business days, if a return call is not received from patient and/or [name] in the meantime.  CSW will then proceed with case closure if a return call is not received from patient or [name] within a total of 10 business days, as required number of phone attempts will have been made and outreach letter mailed.   CSW was able to converse with [name], [name] with the [facility], who reported that she is not actively involved with patient's care in the home, despite CSW being told otherwise.  [name] admitted that patient does not meet criteria for their program, putting her direct report on the phone to confirm this information.  [name] indicated, that to the best of her knowledge, patient was referred to [name] upon discharge from the hospital, and that they are the agency providing services to patient in the home.  These home health services include nursing, physical therapy, occupational therapy and an aide.  [name] could not say for sure whether or not home health social work services were included in the orders for patient, but that she would assume so.    CSW received an e-mail message from [name], [name], encouraging CSW to make contact with [name], also with  [facility], to try and coordinate services for patient.  CSW was able to make contact with [name], to inquire as to how CSW could be of assistance to patient at this time.  [name] was unable to say for sure, but indicated that she and her colleagues were planning a home visit with patient today (Friday, March 01, 2019), wanting to know if CSW would like to be contacted while they are in the home.  This would enable CSW to be in direct contact with patient, as [name] and her colleagues understand that [name], as well as [name], Geriatric Nurse Practitioner, also with [place] Management, have been unsuccessful in their outreach attempts to patient thus far.  CSW voiced understanding and was definitely agreeable to this plan, encouraging [name] to confirm a time with CSW so that CSW can make arrangements to be available.  [name] indicated that she would text a time to [name]; however, CSW has not received a text, as of yet.  [name] reported that she and her colleagues with [facility] will be transitioning patient to their [place] today.  CSW explained to [name] that [name] recently closed patient's case, due to inability to establish initial phone contact with patient, but has agreed to resume services with patient once [facility], [facility] and [name] have discharged patient from their services, not wanting to duplicate services or cause further confusion to the patient.  Patient has been started on home oxygen, provided by  [name].   [name], BSW, MSW, LCSW  Licensed Education officer, environmental Health System  Mailing [place] [address] [place], [place] [address] Physical Address-[address], [place], [place] [address] Toll Free Main # [phone] Fax # [phone] Cell # [phone]  Office # [phone] [name]

## 2019-03-06 DIAGNOSIS — E1122 Type 2 diabetes mellitus with diabetic chronic kidney disease: Secondary | ICD-10-CM | POA: Diagnosis not present

## 2019-03-06 DIAGNOSIS — Z794 Long term (current) use of insulin: Secondary | ICD-10-CM | POA: Diagnosis not present

## 2019-03-06 DIAGNOSIS — H547 Unspecified visual loss: Secondary | ICD-10-CM | POA: Diagnosis not present

## 2019-03-06 DIAGNOSIS — N1832 Chronic kidney disease, stage 3b: Secondary | ICD-10-CM | POA: Diagnosis not present

## 2019-03-06 DIAGNOSIS — E113413 Type 2 diabetes mellitus with severe nonproliferative diabetic retinopathy with macular edema, bilateral: Secondary | ICD-10-CM | POA: Diagnosis not present

## 2019-03-06 DIAGNOSIS — J9601 Acute respiratory failure with hypoxia: Secondary | ICD-10-CM | POA: Diagnosis not present

## 2019-03-06 DIAGNOSIS — D631 Anemia in chronic kidney disease: Secondary | ICD-10-CM | POA: Diagnosis not present

## 2019-03-06 DIAGNOSIS — I129 Hypertensive chronic kidney disease with stage 1 through stage 4 chronic kidney disease, or unspecified chronic kidney disease: Secondary | ICD-10-CM | POA: Diagnosis not present

## 2019-03-06 DIAGNOSIS — E1143 Type 2 diabetes mellitus with diabetic autonomic (poly)neuropathy: Secondary | ICD-10-CM | POA: Diagnosis not present

## 2019-03-06 DIAGNOSIS — Z8673 Personal history of transient ischemic attack (TIA), and cerebral infarction without residual deficits: Secondary | ICD-10-CM | POA: Diagnosis not present

## 2019-03-08 ENCOUNTER — Other Ambulatory Visit: Payer: Self-pay | Admitting: *Deleted

## 2019-03-08 NOTE — Patient Outreach (Signed)
[place] [facility]) Care Management  03/08/2019  Kiyla Potthast 1955/12/09 [id number]    CSW made a third and final attempt to try and contact patient and patient's son, Fiona Legg today to perform the initial phone assessment, as well as assess and assist with social work needs and services, without success.  CSW left HIPAA compliant messages on voicemail for both patient and Mr. Morcom and is currently awaiting a return call.  CSW will proceed with case closure in two business days, if a return call is not received from patient or Mr. Tesh in the meantime, as required number of phone attempts have been made and an outreach letter was mailed to patient's home allowing 10 business days for a response if patient or Mr. Haffner was interested in receiving social work services and assistance through [facility] with [facility].  CSW is aware that [name], Geriatric Nurse Practitioner, also with [facility], has already closed patient's case due to inability to establish initial phone contact.  [name], BSW, MSW, LCSW  Licensed Education officer, environmental Health System  Mailing [place] [address], [place], [place] [address] Physical Address-[address], [place], [place] [address] Toll Free Main # [phone] Fax # [phone] Cell # [phone]  Office # [phone] [email or web address]

## 2019-03-11 ENCOUNTER — Ambulatory Visit: Payer: Self-pay | Admitting: *Deleted

## 2019-03-12 ENCOUNTER — Encounter: Payer: Self-pay | Admitting: *Deleted

## 2019-03-12 ENCOUNTER — Other Ambulatory Visit: Payer: Self-pay | Admitting: *Deleted

## 2019-03-12 NOTE — Patient Outreach (Signed)
[facility]) Care [name]  03/12/2019  Adrienne [name] Sep 17, 1955 [id number]   CSW will perform a case closure on patient, due to inability to establish initial phone contact with patient, despite required number of phone attempts made and outreach letter mailed to patient's home, allowing 10 business days for a response if patient was interested in receiving social work services through [place] with [facility].  CSW will notify [facility], as well as patient's Geriatric Nurse Practitioner, also with Adrienne [name], Adrienne [name] of CSW's plans to close patient's case.  CSW will fax an update to patient's Primary Care Physician, Dr. Charlott Rakes to ensure that they are aware of CSW's involvement with patient's plan of care.    [name], BSW, MSW, LCSW  Licensed Education officer, environmental Health System  Mailing [place] [address], [place], [place] [address] Physical Address-[address], [place], [place] [address] Toll Free Main # [phone] Fax # [phone] Cell # [phone]  Office # [phone] [email or web address]

## 2019-03-14 DIAGNOSIS — E1143 Type 2 diabetes mellitus with diabetic autonomic (poly)neuropathy: Secondary | ICD-10-CM | POA: Diagnosis not present

## 2019-03-14 DIAGNOSIS — J9601 Acute respiratory failure with hypoxia: Secondary | ICD-10-CM | POA: Diagnosis not present

## 2019-03-14 DIAGNOSIS — E1122 Type 2 diabetes mellitus with diabetic chronic kidney disease: Secondary | ICD-10-CM | POA: Diagnosis not present

## 2019-03-14 DIAGNOSIS — Z794 Long term (current) use of insulin: Secondary | ICD-10-CM | POA: Diagnosis not present

## 2019-03-14 DIAGNOSIS — I129 Hypertensive chronic kidney disease with stage 1 through stage 4 chronic kidney disease, or unspecified chronic kidney disease: Secondary | ICD-10-CM | POA: Diagnosis not present

## 2019-03-14 DIAGNOSIS — Z8673 Personal history of transient ischemic attack (TIA), and cerebral infarction without residual deficits: Secondary | ICD-10-CM | POA: Diagnosis not present

## 2019-03-14 DIAGNOSIS — N1832 Chronic kidney disease, stage 3b: Secondary | ICD-10-CM | POA: Diagnosis not present

## 2019-03-14 DIAGNOSIS — D631 Anemia in chronic kidney disease: Secondary | ICD-10-CM | POA: Diagnosis not present

## 2019-03-14 DIAGNOSIS — E113413 Type 2 diabetes mellitus with severe nonproliferative diabetic retinopathy with macular edema, bilateral: Secondary | ICD-10-CM | POA: Diagnosis not present

## 2019-03-14 DIAGNOSIS — H547 Unspecified visual loss: Secondary | ICD-10-CM | POA: Diagnosis not present

## 2019-03-18 ENCOUNTER — Other Ambulatory Visit: Payer: Self-pay

## 2019-03-18 ENCOUNTER — Ambulatory Visit: Payer: Medicare Other | Attending: Family Medicine | Admitting: Family Medicine

## 2019-03-18 DIAGNOSIS — Z8616 Personal history of COVID-19: Secondary | ICD-10-CM | POA: Diagnosis not present

## 2019-03-18 DIAGNOSIS — I10 Essential (primary) hypertension: Secondary | ICD-10-CM | POA: Diagnosis not present

## 2019-03-18 DIAGNOSIS — E113493 Type 2 diabetes mellitus with severe nonproliferative diabetic retinopathy without macular edema, bilateral: Secondary | ICD-10-CM | POA: Diagnosis not present

## 2019-03-18 DIAGNOSIS — Z794 Long term (current) use of insulin: Secondary | ICD-10-CM

## 2019-03-18 DIAGNOSIS — N1831 Chronic kidney disease, stage 3a: Secondary | ICD-10-CM

## 2019-03-18 NOTE — Progress Notes (Signed)
Virtual Visit via Telephone Note  I connected with Adrienne [name], on 03/18/2019 at 3:37 PM by telephone due to the COVID-19 pandemic and verified that I am speaking with the correct person using two identifiers.   Consent: I discussed the limitations, risks, security and privacy concerns of performing an evaluation and management service by telephone and the availability of in person appointments. I also discussed with the patient that there may be a patient responsible charge related to this service. The patient expressed understanding and agreed to proceed.   Location of Patient: Home  Location of Provider: Clinic   Persons participating in Telemedicine visit: Hayli Tuitt [place]-CMA Dr. 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 She was hospitalized at [facility] 02/13/19-02/18/19 for acute respiratory failure secondary to COVID-19 treated with Remdesivir and steroids, one dose of Toclizumab to which she responded well after which she was discharged home with [facility].  She still has dyspnea with going up and down steps; she does have some fatigue. She complains of sleeping a lot ever since she had COVID. She also has cough. She has problems with her stomach as she describes this as discomfort and she is constipated.   Blood sugar today was 104 about 2 hours ago. Highest sugars are in the 200 and she has had some low sugars and has woken up from her sleep sweating. States this occurred when she had COVID as she had a poor appetite then. Her Glucometer has no audio but her niece tells her the numbers of her blood glucose. She has been eating Pretzels and  her appetite is back to normal and hypoglycemia has improved. Past Medical History:  Diagnosis Date  . Diabetes ([place])   . High cholesterol   . Hypertension    Allergies  Allergen Reactions  . Codeine Itching  . Hydrocodone Itching  . Oxycodone Itching  . Penicillins Itching  . Clindamycin/Lincomycin Itching  . Niacin And Related Itching  . Penicillin G Itching  . Niacin Itching and Anxiety    Facial flushing    Current Outpatient Medications on File Prior to Visit  Medication Sig Dispense Refill  . acetaminophen (TYLENOL) 500 MG tablet Take 500 mg by mouth every 6 (six) hours as needed for moderate pain.    Marland Kitchen aspirin EC 81 MG tablet Take 81 mg by mouth daily.     Marland Kitchen atorvastatin (LIPITOR) 80 MG tablet Take 1 tablet (80 mg total) by mouth daily. 90 tablet 1  . clopidogrel (PLAVIX) 75 MG tablet Take 1 tablet (75 mg total) by mouth daily. 90 tablet 1  . clotrimazole (GYNE-LOTRIMIN) 1 % vaginal cream Place 1 Applicatorful vaginally at bedtime. 45 g 0  . Continuous Blood Gluc Sensor (FREESTYLE LIBRE 14 DAY SENSOR) MISC PLACE ONE SENSOR AND USE TO CHECK BLOOD SUGAR FOR 14 DAYS THEN REMOVE AND REPLACE. 2 each 2  . famotidine (PEPCID) 20 MG tablet Take 1 tablet (20 mg total) by mouth 2 (two) times daily. 180 tablet 1  . Insulin Glargine (LANTUS SOLOSTAR) 100 UNIT/ML Solostar Pen Inject 22 Units into the skin 2 (two) times daily. 30 mL 3  . Insulin Pen Needle 31G X 5 MM MISC 1 Act by Does not apply  route 4 (four) times daily - after meals and at bedtime. 100 each 11  . losartan-hydrochlorothiazide (HYZAAR) 100-25 MG tablet Take 1 tablet by mouth daily. Dx: Hypertension 90 tablet 1  . metFORMIN (GLUCOPHAGE) 1000 MG tablet Take 1 tablet (1,000 mg total) by mouth 2 (two) times daily. 180 tablet 1  . guaiFENesin-dextromethorphan (ROBITUSSIN DM) 100-10 MG/5ML syrup Take 5 mLs by mouth every 6 (six) hours as needed for cough. (Patient not taking: Reported on 03/18/2019) 118 mL 0  . Misc. Devices MISC  Blood pressure monitor. Dx: Hypertension 1 each 0   No current facility-administered medications on file prior to visit.    Observations/Objective: Awake, alert, oriented x3 Not in acute distress  Lab Results  Component Value Date   HGBA1C 9.1 (A) 12/18/2018    CMP Latest Ref Rng & Units 02/18/2019 02/17/2019 02/16/2019  Glucose 70 - 99 mg/dL 180(H) 143(H) 218(H)  BUN 8 - 23 mg/dL 61(H) 73(H) 73(H)  Creatinine 0.44 - 1.00 mg/dL 2.04(H) 2.52(H) 2.36(H)  Sodium 135 - 145 mmol/L 139 141 139  Potassium 3.5 - 5.1 mmol/L 3.4(L) 3.7 3.8  Chloride 98 - 111 mmol/L 101 102 100  CO2 22 - 32 mmol/L 26 25 25   Calcium 8.9 - 10.3 mg/dL 8.7(L) 8.9 9.1  Total Protein 6.5 - 8.1 g/dL 6.9 6.7 -  Total Bilirubin 0.3 - 1.2 mg/dL 0.7 0.7 -  Alkaline Phos 38 - 126 U/L 65 65 -  AST 15 - 41 U/L 25 24 -  ALT 0 - 44 U/L 18 14 -     Assessment and Plan: 1. 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 Advised on the need for annual eye exams, annual foot exams, Pneumonia vaccine.   2. Essential hypertension Slightly elevated at last visit No regimen change but will reassess at in person visit Counseled on blood pressure goal of less than 130/80, low-sodium, DASH diet, medication compliance, 150 minutes of moderate intensity exercise per week. Discussed medication compliance, adverse effects.  3. History of  COVID-19 Improved but with residual cough and dyspnea Advised to use antitussive - she does have Robitussin PT is ongoing  4. Stage 3a chronic kidney disease Acute on chronic insult at the time of hospitalization She will need repeat labs at in person visit Avoid nephrotoxic agents  Follow Up Instructions: Return in about 6 weeks (around 04/29/2019) for chronic medical conditions.    I discussed the assessment and treatment plan with the patient. The patient was provided an opportunity to ask questions and all were answered. The patient agreed with the plan and demonstrated an understanding of the instructions.   The patient was advised to call back or seek an in-person evaluation if the symptoms worsen or if the condition fails to improve as anticipated.     I provided 25 minutes total of non-face-to-face time during this encounter including median intraservice time, reviewing previous notes, investigations, ordering medications, medical decision making, coordinating care and  patient verbalized understanding at the end of the visit.     Charlott Rakes, MD, FAAFP. [facility] and [place], [place]   03/18/2019, 3:37 PM

## 2019-03-18 NOTE — Progress Notes (Signed)
Patient has been called and DOB has been verified. Patient has been screened and transferred to PCP to start phone visit.  Patient would like to discuss Covid and vaccinations.

## 2019-03-19 ENCOUNTER — Encounter: Payer: Self-pay | Admitting: Family Medicine

## 2019-03-20 DIAGNOSIS — J9601 Acute respiratory failure with hypoxia: Secondary | ICD-10-CM | POA: Diagnosis not present

## 2019-03-20 DIAGNOSIS — E113413 Type 2 diabetes mellitus with severe nonproliferative diabetic retinopathy with macular edema, bilateral: Secondary | ICD-10-CM | POA: Diagnosis not present

## 2019-03-20 DIAGNOSIS — D631 Anemia in chronic kidney disease: Secondary | ICD-10-CM | POA: Diagnosis not present

## 2019-03-20 DIAGNOSIS — Z794 Long term (current) use of insulin: Secondary | ICD-10-CM | POA: Diagnosis not present

## 2019-03-20 DIAGNOSIS — N1832 Chronic kidney disease, stage 3b: Secondary | ICD-10-CM | POA: Diagnosis not present

## 2019-03-20 DIAGNOSIS — Z8673 Personal history of transient ischemic attack (TIA), and cerebral infarction without residual deficits: Secondary | ICD-10-CM | POA: Diagnosis not present

## 2019-03-20 DIAGNOSIS — H547 Unspecified visual loss: Secondary | ICD-10-CM | POA: Diagnosis not present

## 2019-03-20 DIAGNOSIS — E1122 Type 2 diabetes mellitus with diabetic chronic kidney disease: Secondary | ICD-10-CM | POA: Diagnosis not present

## 2019-03-20 DIAGNOSIS — E1143 Type 2 diabetes mellitus with diabetic autonomic (poly)neuropathy: Secondary | ICD-10-CM | POA: Diagnosis not present

## 2019-03-20 DIAGNOSIS — I129 Hypertensive chronic kidney disease with stage 1 through stage 4 chronic kidney disease, or unspecified chronic kidney disease: Secondary | ICD-10-CM | POA: Diagnosis not present

## 2019-03-20 MED FILL — LANTUS SOLOSTAR 100 UNITS/M: 100 | 67 days supply | Qty: 30 | Fill #1

## 2019-03-26 ENCOUNTER — Other Ambulatory Visit: Payer: Self-pay | Admitting: Family Medicine

## 2019-03-27 DIAGNOSIS — E1143 Type 2 diabetes mellitus with diabetic autonomic (poly)neuropathy: Secondary | ICD-10-CM | POA: Diagnosis not present

## 2019-03-27 DIAGNOSIS — H547 Unspecified visual loss: Secondary | ICD-10-CM | POA: Diagnosis not present

## 2019-03-27 DIAGNOSIS — N1832 Chronic kidney disease, stage 3b: Secondary | ICD-10-CM | POA: Diagnosis not present

## 2019-03-27 DIAGNOSIS — D631 Anemia in chronic kidney disease: Secondary | ICD-10-CM | POA: Diagnosis not present

## 2019-03-27 DIAGNOSIS — J9601 Acute respiratory failure with hypoxia: Secondary | ICD-10-CM | POA: Diagnosis not present

## 2019-03-27 DIAGNOSIS — E1122 Type 2 diabetes mellitus with diabetic chronic kidney disease: Secondary | ICD-10-CM | POA: Diagnosis not present

## 2019-03-27 DIAGNOSIS — Z8673 Personal history of transient ischemic attack (TIA), and cerebral infarction without residual deficits: Secondary | ICD-10-CM | POA: Diagnosis not present

## 2019-03-27 DIAGNOSIS — E113413 Type 2 diabetes mellitus with severe nonproliferative diabetic retinopathy with macular edema, bilateral: Secondary | ICD-10-CM | POA: Diagnosis not present

## 2019-03-27 DIAGNOSIS — Z794 Long term (current) use of insulin: Secondary | ICD-10-CM | POA: Diagnosis not present

## 2019-03-27 DIAGNOSIS — I129 Hypertensive chronic kidney disease with stage 1 through stage 4 chronic kidney disease, or unspecified chronic kidney disease: Secondary | ICD-10-CM | POA: Diagnosis not present

## 2019-03-27 MED FILL — FLUTICASONE PROP 50 MCG SPR: 50 | 30 days supply | Qty: 16 | Fill #0

## 2019-03-29 DIAGNOSIS — N1832 Chronic kidney disease, stage 3b: Secondary | ICD-10-CM | POA: Diagnosis not present

## 2019-03-29 DIAGNOSIS — Z794 Long term (current) use of insulin: Secondary | ICD-10-CM | POA: Diagnosis not present

## 2019-03-29 DIAGNOSIS — E113413 Type 2 diabetes mellitus with severe nonproliferative diabetic retinopathy with macular edema, bilateral: Secondary | ICD-10-CM | POA: Diagnosis not present

## 2019-03-29 DIAGNOSIS — Z8673 Personal history of transient ischemic attack (TIA), and cerebral infarction without residual deficits: Secondary | ICD-10-CM | POA: Diagnosis not present

## 2019-03-29 DIAGNOSIS — I129 Hypertensive chronic kidney disease with stage 1 through stage 4 chronic kidney disease, or unspecified chronic kidney disease: Secondary | ICD-10-CM | POA: Diagnosis not present

## 2019-03-29 DIAGNOSIS — J9601 Acute respiratory failure with hypoxia: Secondary | ICD-10-CM | POA: Diagnosis not present

## 2019-03-29 DIAGNOSIS — E1122 Type 2 diabetes mellitus with diabetic chronic kidney disease: Secondary | ICD-10-CM | POA: Diagnosis not present

## 2019-03-29 DIAGNOSIS — D631 Anemia in chronic kidney disease: Secondary | ICD-10-CM | POA: Diagnosis not present

## 2019-03-29 DIAGNOSIS — H547 Unspecified visual loss: Secondary | ICD-10-CM | POA: Diagnosis not present

## 2019-03-29 DIAGNOSIS — E1143 Type 2 diabetes mellitus with diabetic autonomic (poly)neuropathy: Secondary | ICD-10-CM | POA: Diagnosis not present

## 2019-04-01 DIAGNOSIS — H547 Unspecified visual loss: Secondary | ICD-10-CM | POA: Diagnosis not present

## 2019-04-01 DIAGNOSIS — J9601 Acute respiratory failure with hypoxia: Secondary | ICD-10-CM | POA: Diagnosis not present

## 2019-04-01 DIAGNOSIS — D631 Anemia in chronic kidney disease: Secondary | ICD-10-CM | POA: Diagnosis not present

## 2019-04-01 DIAGNOSIS — Z8673 Personal history of transient ischemic attack (TIA), and cerebral infarction without residual deficits: Secondary | ICD-10-CM | POA: Diagnosis not present

## 2019-04-01 DIAGNOSIS — E1143 Type 2 diabetes mellitus with diabetic autonomic (poly)neuropathy: Secondary | ICD-10-CM | POA: Diagnosis not present

## 2019-04-01 DIAGNOSIS — E113413 Type 2 diabetes mellitus with severe nonproliferative diabetic retinopathy with macular edema, bilateral: Secondary | ICD-10-CM | POA: Diagnosis not present

## 2019-04-01 DIAGNOSIS — E1122 Type 2 diabetes mellitus with diabetic chronic kidney disease: Secondary | ICD-10-CM | POA: Diagnosis not present

## 2019-04-01 DIAGNOSIS — Z794 Long term (current) use of insulin: Secondary | ICD-10-CM | POA: Diagnosis not present

## 2019-04-01 DIAGNOSIS — N1832 Chronic kidney disease, stage 3b: Secondary | ICD-10-CM | POA: Diagnosis not present

## 2019-04-01 DIAGNOSIS — I129 Hypertensive chronic kidney disease with stage 1 through stage 4 chronic kidney disease, or unspecified chronic kidney disease: Secondary | ICD-10-CM | POA: Diagnosis not present

## 2019-04-03 ENCOUNTER — Encounter: Payer: Self-pay | Admitting: *Deleted

## 2019-04-03 ENCOUNTER — Other Ambulatory Visit: Payer: Self-pay | Admitting: *Deleted

## 2019-04-03 NOTE — Patient Outreach (Signed)
Follow up call to pt.  Adrienne [name] was able to speak with me today. She has finished her PT and [facility] continues to be active with her.  We discussed arrangements for ongoing activity with [facility] Care management, which she desires. We plan to talk once a month, with a focus on her diabetes control.  Initial evaluation completed.  Fall Risk  04/03/2019 03/18/2019 12/18/2018 09/18/2018 07/30/2018  Falls in the past year? 0 0 0 0 0  Comment Overriding this screen and putting pt in as HIGH RISK due to blindness. - - - -  Number falls in past yr: 0 - - - -  Injury with Fall? 0 - - - -  Risk for fall due to : Impaired vision - - - -  Follow up Falls evaluation completed - - - -   Depression screen [facility] 2/9 04/03/2019 01/03/2018 08/29/2016 06/17/2016 09/11/2015  Decreased Interest 1 1 3  - 0  Down, Depressed, Hopeless 0 1 0 - 0  PHQ - 2 Score 1 2 3  - 0  Altered sleeping - 1 1 3  -  Tired, decreased energy - 1 1 2  -  Change in appetite - 1 3 0 -  Feeling bad or failure about yourself  - 1 1 0 -  Trouble concentrating - 0 1 0 -  Moving slowly or fidgety/restless - 0 0 0 -  Suicidal thoughts - 0 0 0 -  PHQ-9 Score - 6 10 - -   THN CM Care Plan Problem One     Most Recent Value  Care Plan Problem One  Diabetes (Hbg A1C not at goal < or = 7.0, current 9.1)  Role Documenting the Problem One  Care Management Coordinator  Care Plan for Problem One  Active  THN Long Term Goal   HgbA1C will be reduced 1 Gm from this months pending lab draw.  THN Long Term Goal Start Date  04/03/19  Interventions for Problem One Long Term Goal  Will send pt and son All About Carbs.  THN CM Short Term Goal #1   Son will discuss current grocery shopping habits and we will discuss suggestions for improvements within the next 30 days.  THN CM Short Term Goal #1 Start Date  04/03/19  Interventions for Short Term Goal #1  Sending all about carbs. send letter to advise son to review and be ready to discuss grocery shopping.   THN CM Short Term Goal #2   Pt will receive and start using her new glucose sensor within the next 30 days.     I will call her again in one month.  [name]. [name], MSN, [facility] Gerontological Nurse Practitioner [facility] Care Management [phone]

## 2019-04-04 ENCOUNTER — Encounter: Payer: Self-pay | Admitting: *Deleted

## 2019-04-04 DIAGNOSIS — Z8673 Personal history of transient ischemic attack (TIA), and cerebral infarction without residual deficits: Secondary | ICD-10-CM | POA: Diagnosis not present

## 2019-04-04 DIAGNOSIS — J9601 Acute respiratory failure with hypoxia: Secondary | ICD-10-CM | POA: Diagnosis not present

## 2019-04-04 DIAGNOSIS — I129 Hypertensive chronic kidney disease with stage 1 through stage 4 chronic kidney disease, or unspecified chronic kidney disease: Secondary | ICD-10-CM | POA: Diagnosis not present

## 2019-04-04 DIAGNOSIS — N1832 Chronic kidney disease, stage 3b: Secondary | ICD-10-CM | POA: Diagnosis not present

## 2019-04-04 DIAGNOSIS — Z794 Long term (current) use of insulin: Secondary | ICD-10-CM | POA: Diagnosis not present

## 2019-04-04 DIAGNOSIS — E1143 Type 2 diabetes mellitus with diabetic autonomic (poly)neuropathy: Secondary | ICD-10-CM | POA: Diagnosis not present

## 2019-04-04 DIAGNOSIS — H547 Unspecified visual loss: Secondary | ICD-10-CM | POA: Diagnosis not present

## 2019-04-04 DIAGNOSIS — E1122 Type 2 diabetes mellitus with diabetic chronic kidney disease: Secondary | ICD-10-CM | POA: Diagnosis not present

## 2019-04-04 DIAGNOSIS — E113413 Type 2 diabetes mellitus with severe nonproliferative diabetic retinopathy with macular edema, bilateral: Secondary | ICD-10-CM | POA: Diagnosis not present

## 2019-04-04 DIAGNOSIS — D631 Anemia in chronic kidney disease: Secondary | ICD-10-CM | POA: Diagnosis not present

## 2019-04-06 DIAGNOSIS — E119 Type 2 diabetes mellitus without complications: Secondary | ICD-10-CM | POA: Diagnosis not present

## 2019-04-06 DIAGNOSIS — Z794 Long term (current) use of insulin: Secondary | ICD-10-CM | POA: Diagnosis not present

## 2019-04-08 DIAGNOSIS — Z8673 Personal history of transient ischemic attack (TIA), and cerebral infarction without residual deficits: Secondary | ICD-10-CM | POA: Diagnosis not present

## 2019-04-08 DIAGNOSIS — I129 Hypertensive chronic kidney disease with stage 1 through stage 4 chronic kidney disease, or unspecified chronic kidney disease: Secondary | ICD-10-CM | POA: Diagnosis not present

## 2019-04-08 DIAGNOSIS — D631 Anemia in chronic kidney disease: Secondary | ICD-10-CM | POA: Diagnosis not present

## 2019-04-08 DIAGNOSIS — E1122 Type 2 diabetes mellitus with diabetic chronic kidney disease: Secondary | ICD-10-CM | POA: Diagnosis not present

## 2019-04-08 DIAGNOSIS — H547 Unspecified visual loss: Secondary | ICD-10-CM | POA: Diagnosis not present

## 2019-04-08 DIAGNOSIS — N1832 Chronic kidney disease, stage 3b: Secondary | ICD-10-CM | POA: Diagnosis not present

## 2019-04-08 DIAGNOSIS — Z794 Long term (current) use of insulin: Secondary | ICD-10-CM | POA: Diagnosis not present

## 2019-04-08 DIAGNOSIS — E1143 Type 2 diabetes mellitus with diabetic autonomic (poly)neuropathy: Secondary | ICD-10-CM | POA: Diagnosis not present

## 2019-04-08 DIAGNOSIS — J9601 Acute respiratory failure with hypoxia: Secondary | ICD-10-CM | POA: Diagnosis not present

## 2019-04-08 DIAGNOSIS — E113413 Type 2 diabetes mellitus with severe nonproliferative diabetic retinopathy with macular edema, bilateral: Secondary | ICD-10-CM | POA: Diagnosis not present

## 2019-04-17 MED FILL — LOSARTAN-HCTZ 100-25 MG TAB: 100-25 | 90 days supply | Qty: 90 | Fill #1

## 2019-04-17 MED FILL — metFORMIN HCL 1000 MG TABS: 1000 | 90 days supply | Qty: 180 | Fill #1

## 2019-04-17 MED FILL — ATORVASTATIN 80 MG TABLET: 80 | 90 days supply | Qty: 90 | Fill #1

## 2019-04-17 MED FILL — CLOPIDOGREL 75 MG TABLET: 75 | 90 days supply | Qty: 90 | Fill #1

## 2019-04-25 ENCOUNTER — Telehealth: Payer: Self-pay

## 2019-04-25 NOTE — Telephone Encounter (Signed)
Patient has an appointment on 04/29/2019 and patient does not want to come in the building. She has requested a telephone visit, but patient is wanting to know if her home health nurse can draw any labs needed from you.

## 2019-04-26 NOTE — Telephone Encounter (Signed)
Patient was called and informed that lab can not be drawn at home. Patient will arrive at clinic early to get lab work done prior to phone visit.

## 2019-04-26 NOTE — Telephone Encounter (Signed)
Unfortunately labs drawn by home health do not interface with epic.  I would like her to have in person labs in the office.  We can also see if Helene Kelp will be willing to draw labs from her car when she comes to the clinic.

## 2019-04-29 ENCOUNTER — Other Ambulatory Visit: Payer: Self-pay

## 2019-04-29 ENCOUNTER — Ambulatory Visit: Payer: Medicare Other | Attending: Family Medicine | Admitting: Family Medicine

## 2019-04-29 DIAGNOSIS — R059 Cough, unspecified: Secondary | ICD-10-CM

## 2019-04-29 DIAGNOSIS — Z794 Long term (current) use of insulin: Secondary | ICD-10-CM

## 2019-04-29 DIAGNOSIS — E113493 Type 2 diabetes mellitus with severe nonproliferative diabetic retinopathy without macular edema, bilateral: Secondary | ICD-10-CM | POA: Diagnosis not present

## 2019-04-29 DIAGNOSIS — R05 Cough: Secondary | ICD-10-CM | POA: Diagnosis not present

## 2019-04-29 DIAGNOSIS — K219 Gastro-esophageal reflux disease without esophagitis: Secondary | ICD-10-CM | POA: Diagnosis not present

## 2019-04-29 NOTE — Progress Notes (Signed)
Virtual Visit via Telephone Note  I connected with [name], on 04/29/2019 at 3:58 PM by telephone due to the COVID-19 pandemic and verified that I am speaking with the correct person using two identifiers.   Consent: I discussed the limitations, risks, security and privacy concerns of performing an evaluation and management service by telephone and the availability of in person appointments. I also discussed with the patient that there may be a patient responsible charge related to this service. The patient expressed understanding and agreed to proceed.   Location of Patient: Home  Location of Provider: Clinic   Persons participating in Telemedicine visit: Margaurite Salido Farrington-CMA Dr. 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  She still has a residual cough, she has also felt she has no energy since she had COVID-19. Sometimes has a memory fog Cough drop is sometimes helpful and prevents her from coughing through the night. She complains of reflux and sometimes feels like she might throw up.  She does have famotidine on her med list but has been taking this as needed.  Her kids have been making her meals and she is concerned her sugars might be elevated. She is having to drink Dr Malachi Bonds when she has a queasy stomach and this happens quite a lot. Blood sugars in the morning are 102-104 and she had a high 200 for a random sugar   Past Medical History:  Diagnosis Date  . Diabetes ([place])   . High cholesterol   . Hypertension    Allergies  Allergen Reactions  . Codeine Itching  . Hydrocodone Itching  . Oxycodone Itching  . Penicillins Itching  .  Clindamycin/Lincomycin Itching  . Niacin And Related Itching  . Penicillin G Itching  . Niacin Itching and Anxiety    Facial flushing    Current Outpatient Medications on File Prior to Visit  Medication Sig Dispense Refill  . acetaminophen (TYLENOL) 500 MG tablet Take 500 mg by mouth every 6 (six) hours as needed for moderate pain.    Marland Kitchen aspirin EC 81 MG tablet Take 81 mg by mouth daily.     Marland Kitchen atorvastatin (LIPITOR) 80 MG tablet Take 1 tablet (80 mg total) by mouth daily. 90 tablet 1  . clopidogrel (PLAVIX) 75 MG tablet Take 1 tablet (75 mg total) by mouth daily. 90 tablet 1  . famotidine (PEPCID) 20 MG tablet Take 1 tablet (20 mg total) by mouth 2 (two) times daily. 180 tablet 1  . fluticasone (FLONASE) 50 MCG/ACT nasal spray PLACE 2 SPRAYS INTO BOTH NOSTRILS DAILY. 16 g 6  . Insulin Glargine (LANTUS SOLOSTAR) 100 UNIT/ML Solostar Pen Inject 22 Units into the skin 2 (two) times daily. 30 mL 3  . Insulin Pen Needle 31G X 5 MM MISC 1 Act by Does not apply route 4 (four) times daily - after meals and at bedtime. 100 each 11  . losartan-hydrochlorothiazide (HYZAAR) 100-25 MG tablet Take 1 tablet by mouth daily. Dx: Hypertension 90 tablet 1  . metFORMIN (GLUCOPHAGE) 1000 MG tablet Take 1 tablet (1,000 mg total) by mouth 2 (two) times daily. 180 tablet 1  . Misc. Devices MISC Blood pressure monitor. Dx: Hypertension 1 each 0  . clotrimazole (GYNE-LOTRIMIN) 1 % vaginal  cream Place 1 Applicatorful vaginally at bedtime. (Patient not taking: Reported on 04/03/2019) 45 g 0  . Continuous Blood Gluc Sensor (FREESTYLE LIBRE 14 DAY SENSOR) MISC PLACE ONE SENSOR AND USE TO CHECK BLOOD SUGAR FOR 14 DAYS THEN REMOVE AND REPLACE. (Patient not taking: Reported on 04/03/2019) 2 each 2  . guaiFENesin-dextromethorphan (ROBITUSSIN DM) 100-10 MG/5ML syrup Take 5 mLs by mouth every 6 (six) hours as needed for cough. (Patient not taking: Reported on 03/18/2019) 118 mL 0   No current facility-administered medications on  file prior to visit.    Observations/Objective: Awake, alert, oriented x3 Not in acute distress  Lab Results  Component Value Date   HGBA1C 9.1 (A) 12/18/2018    Assessment and Plan: 1. 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 I will have my [place] work with her to have her come into the office when there is low volume in the clinic to have labs done. - CMP14+EGFR; Future - Lipid panel; Future - Hemoglobin A1c; Future - Microalbumin / creatinine urine ratio; Future  2. Cough Residual cough from COVID-19 infection She is improving Advised to use OTC diabetic antitussive.  3. Gastroesophageal reflux disease without esophagitis She was prescribed famotidine previously Advised to use this twice a day around-the-clock rather than as needed Avoid late meals and avoid recumbency up to 2 hours post meals  Advised that long haul symptoms of COVID-19 could take some time to resolve and may include but not be limited to this include fatigue, cough, dyspnea Perform activity as tolerated.  Follow Up Instructions: 3 months   I discussed the assessment and treatment plan with the patient. The patient was provided an opportunity to ask questions and all were answered. The patient agreed with the plan and demonstrated an understanding of the instructions.   The patient was advised to call back or seek an in-person evaluation if the symptoms worsen or if the condition fails to improve as anticipated.     I provided 17 minutes total of non-face-to-face time during this encounter including median intraservice time, reviewing previous notes, investigations, ordering medications, medical decision making, coordinating care and patient verbalized understanding  at the end of the visit.     Charlott Rakes, MD, FAAFP. [facility] [place], [place]   04/29/2019, 3:58 PM

## 2019-04-29 NOTE — Progress Notes (Signed)
Discomfort in her shoulder blades sometimes.

## 2019-05-07 ENCOUNTER — Other Ambulatory Visit: Payer: Self-pay

## 2019-05-07 ENCOUNTER — Ambulatory Visit: Payer: Medicare Other | Attending: Family Medicine

## 2019-05-07 DIAGNOSIS — E113493 Type 2 diabetes mellitus with severe nonproliferative diabetic retinopathy without macular edema, bilateral: Secondary | ICD-10-CM | POA: Diagnosis not present

## 2019-05-07 DIAGNOSIS — Z794 Long term (current) use of insulin: Secondary | ICD-10-CM | POA: Diagnosis not present

## 2019-05-08 ENCOUNTER — Other Ambulatory Visit: Payer: Self-pay | Admitting: *Deleted

## 2019-05-08 DIAGNOSIS — E119 Type 2 diabetes mellitus without complications: Secondary | ICD-10-CM | POA: Diagnosis not present

## 2019-05-08 DIAGNOSIS — Z794 Long term (current) use of insulin: Secondary | ICD-10-CM | POA: Diagnosis not present

## 2019-05-08 LAB — CMP14+EGFR
ALT: 13 IU/L (ref 0–32)
AST: 14 IU/L (ref 0–40)
Albumin/Globulin Ratio: 1.9 (ref 1.2–2.2)
Albumin: 4.4 g/dL (ref 3.8–4.8)
Alkaline Phosphatase: 95 IU/L (ref 39–117)
BUN/Creatinine Ratio: 16 (ref 12–28)
BUN: 27 mg/dL (ref 8–27)
Bilirubin Total: 0.3 mg/dL (ref 0.0–1.2)
CO2: 20 mmol/L (ref 20–29)
Calcium: 10 mg/dL (ref 8.7–10.3)
Chloride: 99 mmol/L (ref 96–106)
Creatinine, Ser: 1.72 mg/dL — ABNORMAL HIGH (ref 0.57–1.00)
GFR calc Af Amer: 36 mL/min/{1.73_m2} — ABNORMAL LOW (ref >59)
GFR calc non Af Amer: 31 mL/min/{1.73_m2} — ABNORMAL LOW (ref >59)
Globulin, Total: 2.3 g/dL (ref 1.5–4.5)
Glucose: 81 mg/dL (ref 65–99)
Potassium: 3.8 mmol/L (ref 3.5–5.2)
Sodium: 141 mmol/L (ref 134–144)
Total Protein: 6.7 g/dL (ref 6.0–8.5)

## 2019-05-08 LAB — LIPID PANEL
Chol/HDL Ratio: 4.4 ratio (ref 0.0–4.4)
Cholesterol, Total: 202 mg/dL — ABNORMAL HIGH (ref 100–199)
HDL: 46 mg/dL (ref >39)
LDL Chol Calc (NIH): 134 mg/dL — ABNORMAL HIGH (ref 0–99)
Triglycerides: 122 mg/dL (ref 0–149)
VLDL Cholesterol Cal: 22 mg/dL (ref 5–40)

## 2019-05-08 LAB — HEMOGLOBIN A1C
Est. average glucose Bld gHb Est-mCnc: 177 mg/dL
Hgb A1c MFr Bld: 7.8 % — ABNORMAL HIGH (ref 4.8–5.6)

## 2019-05-08 NOTE — Patient Outreach (Signed)
[facility] [facility]) Care Management  05/08/2019  Donnarae Satterfield December 27, 1955 [id number]   Telephone outreach:  Pt is doing well. She is unaware of receiving the All About [employer] information for her son to review for preparing her meals since she is blind.   Reviewed labs from 05/07/19: Creatinine and GFR improved. Hgb AIC down to 7.8 from 9.2! Chol up to 202 and Triglycerides up 134. Pt is on Atorvastatin 80 mg.  Needs colonoscopy appt, NP to make. Needs ophthalmology appt at [facility]. NP to make. Ask Dr. Margarita Rana if she would like to add any meds for lipid control. Discussed possible dietary improvements: reduce chocolate, fried foods, high fat dairy. Requested that pt asks son or daughter to call me about grocery shopping for improved dietary management.  Pt does report using continuous monitor system and levels less than 200 mg.   Encouraged to call if any changes, problems and questions.  I will call again in another month.  [name]. [name], MSN, [facility] Gerontological Nurse Practitioner [facility] Care Management [phone]

## 2019-05-09 ENCOUNTER — Telehealth: Payer: Self-pay

## 2019-05-09 NOTE — Telephone Encounter (Signed)
-----   Message from Charlott Rakes, MD sent at 05/09/2019 12:43 PM EDT ----- A1c is 7.8 which has improved from previous labs.  Kidney function is slightly abnormal but has improved compared to previous labs.  Cholesterol is elevated but has improved compared to previous labs to comply with a low-cholesterol diet.  Please advise her to continue her current regimen.

## 2019-05-09 NOTE — Telephone Encounter (Signed)
Patient was called and a voicemail was left informing patient to return phone call for lab results. 

## 2019-05-09 NOTE — Telephone Encounter (Signed)
Patient returned call and was informed of lab results.

## 2019-05-15 ENCOUNTER — Other Ambulatory Visit: Payer: Self-pay | Admitting: *Deleted

## 2019-05-15 NOTE — Patient Outreach (Signed)
[facility]) Care Management  05/15/2019  Adrienne [name] [birth year] [id number]   Pt requested NP make GI appt for colonoscopy and diabetic eye exam appt at [facility].  [facility]: Unable to see pt. Given directions for pt to [facility].                 Will try to set up with [place].  [facility] in [place] on Monday, June 28th at 12:30 pm. Pt to be there 15 minutes prior to her appt. Bring photo ID, Insurance card, wear mask. Address is [address]  Pt notified of eye appt and pending GI appt still to be made.  [name]. [name], MSN, [facility] Gerontological Nurse Practitioner [facility] Care Management [phone]

## 2019-06-04 ENCOUNTER — Other Ambulatory Visit: Payer: Self-pay | Admitting: *Deleted

## 2019-06-04 NOTE — Patient Outreach (Signed)
[facility]) Care Management  06/04/2019  Adrienne [name] Mar 22, 1955 [id number]  Telephone outreach:  Ms. Roley says she is doing well. She had to take off her glucose sensor after she received her COVID vaccine because her arm was sore and she has not reapplied it. She denies any sxs of hypo or hyperglycemia.  Inquired about son reviewing the diet information that was provided and she says no one has looked at it.  Pt sister did report back to her about the eye exam that has been scheduled and she will take her.  Pt still needs to call [facility] and ask that her records be sent to [facility].  No other problems reported today.  Plan: 1)Encouraged to have son put on a new sensor to begin monitoring her glucose again.  2)Pt to call [facility] and have GI records sent to [facility] so she can get a screening colonoscopy scheduled. 3)NP will call again in one month.  [name]. [name], MSN, [facility] Gerontological Nurse Practitioner [facility] Care Management [phone]

## 2019-06-08 DIAGNOSIS — E119 Type 2 diabetes mellitus without complications: Secondary | ICD-10-CM | POA: Diagnosis not present

## 2019-06-08 DIAGNOSIS — Z794 Long term (current) use of insulin: Secondary | ICD-10-CM | POA: Diagnosis not present

## 2019-06-14 MED FILL — LANTUS SOLOSTAR 100 UNITS/M: 100 | 88 days supply | Qty: 39 | Fill #2

## 2019-07-03 ENCOUNTER — Telehealth: Payer: Self-pay | Admitting: Family Medicine

## 2019-07-03 ENCOUNTER — Other Ambulatory Visit: Payer: Self-pay | Admitting: Family Medicine

## 2019-07-03 DIAGNOSIS — E7849 Other hyperlipidemia: Secondary | ICD-10-CM

## 2019-07-03 DIAGNOSIS — E1169 Type 2 diabetes mellitus with other specified complication: Secondary | ICD-10-CM

## 2019-07-03 DIAGNOSIS — I10 Essential (primary) hypertension: Secondary | ICD-10-CM

## 2019-07-03 MED ORDER — VITAMIN D 25 MCG (1000 UNIT) PO TABS: 1000.0000 [IU] | ORAL_TABLET | Freq: Every day | ORAL | 3 refills | Status: DC

## 2019-07-03 NOTE — Telephone Encounter (Signed)
Prescription sent to Pharmacy.  

## 2019-07-03 NOTE — Telephone Encounter (Signed)
Patient was called and a voicemail was left informing patient that medication has been sent to pharmacy. °

## 2019-07-03 NOTE — Telephone Encounter (Signed)
Pt is on metformin 1000 mg BID but recent GFR was 36. Will route to PCP with recommendation to reduce to 500 mg BID.

## 2019-07-03 NOTE — Telephone Encounter (Signed)
Please advise to decrease to 500 mg twice daily and increase Lantus by 2 units twice daily

## 2019-07-03 NOTE — Telephone Encounter (Signed)
Will route to PCP 

## 2019-07-03 NOTE — Telephone Encounter (Signed)
Patient would like to know if she can be prescribed Vitamin D medication. Patient states she used to take them and thinks she needs them. Please f/u

## 2019-07-04 ENCOUNTER — Other Ambulatory Visit: Payer: Self-pay | Admitting: *Deleted

## 2019-07-04 MED FILL — METFORMIN HCL 500 MG TABS: 500 | 30 days supply | Qty: 60 | Fill #0

## 2019-07-04 NOTE — Patient Outreach (Signed)
[facility]) Care Management  07/04/2019  Cathye Cuadras 09/30/1955 [id number]  Telephone outreach.  Talked with Ms. Stracke today. She reports she is still coughing some (post COVID infection). It is worse at night and she thinks she has some post nasal drainage. She has some flonase but has not used that and she has some cough syrup and cough drops.  Advised for her to drink plenty of fluids. She can try taking the flonase to see if that reduces her nasal drainage. She can try her sugar free Tussin for cough and her cough drops.  She asked about vitamin D since she does not go outdoors much. Recommended 2000 IU of D3 daily. She should ask Dr. Newlin to check her vitamin D level.  She reports she is sleeping a lot. She is very inactive and understimulated. Encouraged her to get out of the house. Listen to music and dance or chair dance for safety. Join an exercise group. She also says she would like to re-enter the work force. Trying to find a job is challenging. Encouraged her to do that. NP to put out some requests for peers to give suggestions.  She would like to get Moms meals. NP to request.  [facility] CM Care Plan Problem One     Most Recent Value  Care Plan Problem One Diabetes (Hbg A1C not at goal < or = 7.0, current 9.1)  Role Documenting the Problem One Care Management Coordinator  Care Plan for Problem One Active  [facility] Long Term Goal  HgbA1C will be reduced 1 Gm from this months pending lab draw.  [facility] Long Term Goal Start Date 04/03/19  [facility] Long Term Goal Met Date 05/10/19  [facility] CM Short Term Goal #1  Son will discuss current grocery shopping habits and we will discuss suggestions for improvements within the next 30 days.  [facility] CM Short Term Goal #1 Start Date 04/03/19  [facility] CM Short Term Goal #2  Pt will receive and start using her new glucose sensor within the next 30 days.  [facility] CM Short Term Goal #2 Start Date 04/03/19  [facility] CM Short Term Goal #2 Met Date  05/10/19    [facility] CM Care Plan Problem Two     Most Recent Value  Care Plan Problem Two Inactivity (P)   Role Documenting the Problem Two Care Management Coordinator (P)   Care Plan for Problem Two Active (P)   Interventions for Problem Two Long Term Goal  Discussed exercise benefits. (P)   [facility] Long Term Goal Pt will participate in somekind of physical activity 3 times a week for 30 minutes by the end of 90 days. (P)   [facility] Long Term Goal Start Date 07/04/19 (P)      I will call pt again in one month. Will provide employment information when obtained.   C. , MSN, GNP-BC Gerontological Nurse Practitioner [facility] Care Management [phone]     

## 2019-07-04 NOTE — Addendum Note (Signed)
Addended by: [name] on: 07/04/2019 05:09 PM   Modules accepted: Orders

## 2019-07-09 DIAGNOSIS — E119 Type 2 diabetes mellitus without complications: Secondary | ICD-10-CM | POA: Diagnosis not present

## 2019-07-09 DIAGNOSIS — Z794 Long term (current) use of insulin: Secondary | ICD-10-CM | POA: Diagnosis not present

## 2019-07-10 ENCOUNTER — Other Ambulatory Visit: Payer: Self-pay | Admitting: *Deleted

## 2019-07-10 NOTE — Patient Outreach (Signed)
[place] [facility]) Care Management  07/10/2019  Syeda Prickett 30-Aug-1955 [id number]   CSW received referral for meal delivery/assistance on 07/05/2019.  CSW made contact with pt and confirmed her identity.  CSW introduced self, role and reason for call. Per pt, she is blind and has limited abilities outside of the home.  Her son lives with her but she is unsure what he can offer to assist/support her needs.  Pt is interested in meals on wheels and understands there is a long wait list.  CSW will place a referral to them as well as arrange for MOMS meals to be provided for 4 weeks.   Pt reports she owns her home and her mortgage is about 2/3 of her income.  She does receive $19 food stamps and voices financial strains.  CSW talked with pt about financial planning opportunities (re-financing, etc) and will mail her info on the [place] program.  CSW will also mail pt info on local food pantry/resources and other financial aide assistance that is currently available with COVID pandemic. Per pt, her son and/or other family and friends can help her to review and seek assistance.  Pt reports wanting to find work yet has not been able to (with her vision impairment).  CSW discussed the [facility] program also through the state that may be of assistance.  CSW will call [facility] and request they call pt to assess and further determine options vocationally as well as with their Independent Living program.   Pt appreciative of call and support.    CSW will plan a follow up call to pt next month for updates on the above.   [name], MSW, [place] Worker  [place] [phone]

## 2019-07-15 DIAGNOSIS — E113553 Type 2 diabetes mellitus with stable proliferative diabetic retinopathy, bilateral: Secondary | ICD-10-CM | POA: Diagnosis not present

## 2019-07-15 DIAGNOSIS — H540X55 Blindness right eye category 5, blindness left eye category 5: Secondary | ICD-10-CM | POA: Diagnosis not present

## 2019-07-15 DIAGNOSIS — H4053X3 Glaucoma secondary to other eye disorders, bilateral, severe stage: Secondary | ICD-10-CM | POA: Diagnosis not present

## 2019-07-15 MED FILL — ATORVASTATIN 80 MG TABLET: 80 | 90 days supply | Qty: 90 | Fill #0

## 2019-07-15 MED FILL — CLOPIDOGREL 75 MG TABLET: 75 | 90 days supply | Qty: 90 | Fill #0

## 2019-07-15 MED FILL — LOSARTAN-HCTZ 100-25 MG TAB: 100-25 | 90 days supply | Qty: 90 | Fill #0

## 2019-07-25 ENCOUNTER — Other Ambulatory Visit: Payer: Self-pay | Admitting: *Deleted

## 2019-07-25 NOTE — Patient Outreach (Signed)
[place] [facility]) Care Management  07/25/2019  Adrienne [name] Aug 25, 1955 [id number]   CSW spoke with pt by phone who reports she did speak with [facility] staff who indicated their sister agency, [employer], is working with her to seek job opportunities for her.  Pt provided CSW with contact info for Olegario Shearer at [employer] and CSW reached out to her for updates.  Per Olegario Shearer, pt is updated every 2 weeks on the job searching that they are assisting with.  CSW has also updated pt on this and she will anticipate hearing from them in the next week or 2.  Pt also uncertain if she has received the resources mailed to her home and will ask someone to check her mailbox.   Pt in good spirits; she is eager to get back to work when something is found.    CSW offered encouragement and will plan to check in with pt again in the next 2-3 weeks.   [name], MSW, [place] Worker  [place] [phone]

## 2019-08-01 ENCOUNTER — Other Ambulatory Visit: Payer: Self-pay | Admitting: *Deleted

## 2019-08-01 NOTE — Patient Outreach (Signed)
[place] [facility]) Care Management  08/01/2019  Adrienne [name] 28-Jan-1955 [id number]   Telephonic outreach:  Adrienne [name] reports she is doing fair. She did have her annual eye exam and she says they told her what she was told several years ago, that there is nothing they can do for her.  She will be having an in home AWV next week.  She has been talking with our LCSW, [name] and [name] has been very helpful in collaborating with [employer] who is trying to find a job for her.  She says her son checks her glucose levels periodically. She does not know what the levels are. She said she knows she had a high on Sunday because of stress. Denies any hypoglycemic episodes.  Encouraged her to check her glucose levels so she knows how her control is. It is important to prevent other complications due to diabetes: kidney failure and loss of limbs.  We have agreed I will check in with her in 3 months. She was reminded she can call me anytime.  [name]. [name], MSN, [facility] Gerontological Nurse Practitioner [facility] Care Management [phone]

## 2019-08-08 ENCOUNTER — Other Ambulatory Visit: Payer: Self-pay | Admitting: *Deleted

## 2019-08-08 NOTE — Patient Outreach (Signed)
[place] [facility]) Care Management  08/08/2019  Sinclaire Artiga 09-18-1955 [id number]   CSW made contact with pt who reports receiving the MOMS meals and enjoying them.  She also reports receiving a call from a Employment agency for possible job lead at [employer].  "I am not sure what I might could do there, but will call them back".  Pt also shared that her employment counselor at [employer] for the [place] has indicated plans to have her come to [place] office for training but now sure when.   CSW has offered to call [employer] to see about making a referral for them to contact her regarding an appointment for assessment/counseling regarding her finances; overall debt and possible options that may be beneficial to help her with mortgage, bills, etc.    CSW will make referral and update pt and plan for a follow up call in 2-3 weeks.      [name], MSW, [place] Worker  [place] [phone]

## 2019-08-09 DIAGNOSIS — Z794 Long term (current) use of insulin: Secondary | ICD-10-CM | POA: Diagnosis not present

## 2019-08-09 DIAGNOSIS — E119 Type 2 diabetes mellitus without complications: Secondary | ICD-10-CM | POA: Diagnosis not present

## 2019-08-21 ENCOUNTER — Other Ambulatory Visit: Payer: Self-pay | Admitting: Family Medicine

## 2019-08-21 DIAGNOSIS — Z794 Long term (current) use of insulin: Secondary | ICD-10-CM

## 2019-08-21 DIAGNOSIS — E1169 Type 2 diabetes mellitus with other specified complication: Secondary | ICD-10-CM

## 2019-08-21 NOTE — Telephone Encounter (Signed)
Requested medication (s) are due for refill today: yes  Requested medication (s) are on the active medication list: yes  Last refill:  07/03/19 dose decreased due to abnormal renal function  Future visit scheduled: No  Notes to clinic:  Last  lab 05/07/19 CR 1.72  and eGFR 36  Do you want new labs? Dose already was decreased.     Requested Prescriptions  Pending Prescriptions Disp Refills   metFORMIN (GLUCOPHAGE) 500 MG tablet [Pharmacy Med Name: METFORMIN HCL 500 MG TABS 500 Tablet] 120 tablet 1    Sig: TAKE 1 TABLET (500 MG TOTAL) BY MOUTH 2 (TWO) TIMES DAILY.      Endocrinology:  Diabetes - Biguanides Failed - 08/21/2019  5:01 PM      Failed - Cr in normal range and within 360 days    Creat  Date Value Ref Range Status  01/04/2016 0.85 0.50 - 0.99 mg/dL Final    Comment:      For patients > or = 65 years of age: The upper reference limit for Creatinine is approximately 13% higher for people identified as African-American.      Creatinine, Ser  Date Value Ref Range Status  05/07/2019 1.72 (H) 0.57 - 1.00 mg/dL Final   Creatinine, Urine  Date Value Ref Range Status  01/04/2016 255 20 - 320 mg/dL Final          Failed - eGFR in normal range and within 360 days    GFR, Est African American  Date Value Ref Range Status  01/04/2016 86 >=60 mL/min Final   GFR calc Af Amer  Date Value Ref Range Status  05/07/2019 36 (L) >59 mL/min/1.73 Final   GFR, Est Non African American  Date Value Ref Range Status  01/04/2016 74 >=60 mL/min Final   GFR calc non Af Amer  Date Value Ref Range Status  05/07/2019 31 (L) >59 mL/min/1.73 Final          Passed - HBA1C is between 0 and 7.9 and within 180 days    HbA1c, POC (controlled diabetic range)  Date Value Ref Range Status  12/18/2018 9.1 (A) 0.0 - 7.0 % Final   Hgb A1c MFr Bld  Date Value Ref Range Status  05/07/2019 7.8 (H) 4.8 - 5.6 % Final    Comment:             Prediabetes: 5.7 - 6.4          Diabetes: >6.4           Glycemic control for adults with diabetes: <7.0           Passed - Valid encounter within last 6 months    Recent Outpatient Visits           3 months ago 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, with long-term current use of insulin, macular edema presence unspecified (HCC)   [place], [place], MD   5 months ago 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, with long-term current use of insulin, macular edema presence unspecified ([place])   [place], [place], MD   8 months ago 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

## 2019-08-22 ENCOUNTER — Other Ambulatory Visit: Payer: Self-pay | Admitting: *Deleted

## 2019-08-22 NOTE — Patient Outreach (Signed)
[facility]) Care Management  08/22/2019  Adrienne [name] [birth year] [id number]     CSW spoke with pt who reports she has received some of the meal delivery and has enjoyed them.  Per pt, she also heard back from [employer] who continues to seek employment opportunities for her.  "I had an interview at [employer]".  She is awaiting word back from them on possible options.  CSW inquired with pt to find out more about her abilities, skills, and desires for work- she is blind and thus is limited, however, is very motivated to be employed, active, engaging and "getting out of the house".  CSW will continue to seek resources and options to consider. Pt is also interested in some housekeeping assistance- CSW will investigate current options (if any) that are being offered through state/grants, etc.   CSW has made a referral to [facility]) to call pt to discuss her budget, bills/debt and possible options to aide her in her month to month expenses on a limited income.   Pt appreciative of support and guidance and is reminded to reach out if needs arise prior to next outreach call.      [name], MSW, LCSW Clinical Social Worker  [place], MSW, [place] Worker  [place] [phone]

## 2019-09-02 ENCOUNTER — Other Ambulatory Visit: Payer: Self-pay | Admitting: *Deleted

## 2019-09-02 MED FILL — METFORMIN HCL 500 MG TABS: 500 | 90 days supply | Qty: 180 | Fill #0

## 2019-09-02 MED FILL — LANTUS SOLOSTAR 100 UNITS/M: 100 | 47 days supply | Qty: 21 | Fill #3

## 2019-09-03 NOTE — Patient Outreach (Signed)
[place] [facility]) Care Management  09/03/2019  Adrienne [name] 1955-04-18 [id number]   CSW spoke with pt who reports no follow up on job lead/interview she had- she continues to seek options and her counselor with services for the blind does as well.  Pt gave her counselor permission to email CSW her resume to aide in further job searching.  Pt appreciative and optimistic with potential opportunities.   [name], MSW, [place] Worker  [place] [phone]

## 2019-09-06 MED FILL — TRUEPLUS 5-BEVEL PEN NEEDLE: 31G X 5 MM | 25 days supply | Qty: 100 | Fill #0

## 2019-09-10 ENCOUNTER — Other Ambulatory Visit: Payer: Self-pay | Admitting: *Deleted

## 2019-09-10 NOTE — Patient Outreach (Signed)
Adrienne [name]) Care Management  09/10/2019  Adrienne [name] 03/29/1955 [id number]   CSW spoke with pt today by phone who reports nothing has evolved from her job interview and she is continuing to seek opportunities. CSW updated pt on job search and house keeping assistance options and will continue to assist and hope for options to provide.   [name], MSW, [place] Worker  [place] [phone]

## 2019-09-11 DIAGNOSIS — Z794 Long term (current) use of insulin: Secondary | ICD-10-CM | POA: Diagnosis not present

## 2019-09-11 DIAGNOSIS — E119 Type 2 diabetes mellitus without complications: Secondary | ICD-10-CM | POA: Diagnosis not present

## 2019-09-26 ENCOUNTER — Other Ambulatory Visit: Payer: Self-pay | Admitting: Family Medicine

## 2019-09-26 ENCOUNTER — Other Ambulatory Visit: Payer: Self-pay | Admitting: *Deleted

## 2019-09-26 DIAGNOSIS — Z794 Long term (current) use of insulin: Secondary | ICD-10-CM

## 2019-09-26 DIAGNOSIS — E1169 Type 2 diabetes mellitus with other specified complication: Secondary | ICD-10-CM

## 2019-09-27 NOTE — Patient Outreach (Signed)
[facility]) Care Management  09/27/2019  Adrienne [name] 12-24-1955 [id number]   CSW spoke with pt who reports no follow up from her interview; "I called and left message and now will await their reponse".  She continues to desire to find a job she can do yet does acknowledge the limitations given her blindness.   CSW offered support and encouragement and will continue to seek options for pt and follow up. CSW left message for pt's [employer] counselor as well.   [name], MSW, [place] Worker  [place] [phone]

## 2019-09-30 ENCOUNTER — Telehealth: Payer: Self-pay | Admitting: Family Medicine

## 2019-09-30 DIAGNOSIS — E1169 Type 2 diabetes mellitus with other specified complication: Secondary | ICD-10-CM

## 2019-09-30 DIAGNOSIS — Z794 Long term (current) use of insulin: Secondary | ICD-10-CM

## 2019-09-30 NOTE — Telephone Encounter (Signed)
What medication are we referring to?

## 2019-09-30 NOTE — Telephone Encounter (Signed)
Copied from [place] [phone]. Topic: General - Other >> Sep 30, 2019  9:11 AM Celene Kras wrote: Reason for CRM: Pt called stating that she is needing to speak with PCP nurse regarding her medications and vaccinations. Please advise.

## 2019-09-30 NOTE — Telephone Encounter (Signed)
Patient was called and informed to return phone call with more information.

## 2019-09-30 NOTE — Telephone Encounter (Signed)
Patient has been taking 2 pills in the am and two pills in the pm, patient was informed of correct way to take medication.  Patient is going to run out of medication soon.

## 2019-09-30 NOTE — Telephone Encounter (Signed)
Her metformin medication.

## 2019-10-01 MED ORDER — METFORMIN HCL 500 MG PO TABS: 500.0000 mg | ORAL_TABLET | Freq: Two times a day (BID) | ORAL | 2 refills | Status: DC

## 2019-10-01 NOTE — Telephone Encounter (Signed)
Refilled

## 2019-10-07 MED FILL — CLOPIDOGREL 75 MG TABLET: 75 | 90 days supply | Qty: 90 | Fill #1

## 2019-10-07 MED FILL — ATORVASTATIN CALCIUM 80 MG: 80 | 90 days supply | Qty: 90 | Fill #1

## 2019-10-07 MED FILL — LANTUS SOLOSTAR 100 UNITS/M: 100 | 47 days supply | Qty: 21 | Fill #0

## 2019-10-07 MED FILL — LOSARTAN-HCTZ 100-25 MG TAB: 100-25 | 90 days supply | Qty: 90 | Fill #1

## 2019-10-08 ENCOUNTER — Other Ambulatory Visit: Payer: Self-pay | Admitting: *Deleted

## 2019-10-08 NOTE — Patient Outreach (Signed)
[facility]) Care Management  10/08/2019  Adrienne [name] 02/08/1955 [id number]   CSW spoke with pt who reports having some phone problems.  CSW offered birthday wishes for tomorrow.  CSW advised pt that another message was left for the [employer] and await callback. Pt shared with CSW that she did hear back from [facility] and that door is not closed.  Looking forward to breakfast with her daughter tomorrow.  CSW will continue to pursue employment opportunities for pt and follow up in 1-2 weeks.   [name], MSW, [place] Worker  [place] [phone]

## 2019-10-15 DIAGNOSIS — Z794 Long term (current) use of insulin: Secondary | ICD-10-CM | POA: Diagnosis not present

## 2019-10-15 DIAGNOSIS — E119 Type 2 diabetes mellitus without complications: Secondary | ICD-10-CM | POA: Diagnosis not present

## 2019-10-17 ENCOUNTER — Ambulatory Visit: Payer: Self-pay | Admitting: *Deleted

## 2019-10-21 ENCOUNTER — Other Ambulatory Visit: Payer: Self-pay

## 2019-10-21 ENCOUNTER — Encounter: Payer: Self-pay | Admitting: Family Medicine

## 2019-10-21 ENCOUNTER — Ambulatory Visit (HOSPITAL_BASED_OUTPATIENT_CLINIC_OR_DEPARTMENT_OTHER): Payer: Medicare Other | Admitting: Pharmacist

## 2019-10-21 ENCOUNTER — Other Ambulatory Visit: Payer: Self-pay | Admitting: Family Medicine

## 2019-10-21 ENCOUNTER — Ambulatory Visit: Payer: Medicare Other | Attending: Family Medicine | Admitting: Family Medicine

## 2019-10-21 VITALS — BP 128/72 | HR 85 | Ht 66.0 in | Wt 193.0 lb

## 2019-10-21 DIAGNOSIS — E1169 Type 2 diabetes mellitus with other specified complication: Secondary | ICD-10-CM

## 2019-10-21 DIAGNOSIS — R059 Cough, unspecified: Secondary | ICD-10-CM

## 2019-10-21 DIAGNOSIS — E1122 Type 2 diabetes mellitus with diabetic chronic kidney disease: Secondary | ICD-10-CM

## 2019-10-21 DIAGNOSIS — Z794 Long term (current) use of insulin: Secondary | ICD-10-CM | POA: Diagnosis not present

## 2019-10-21 DIAGNOSIS — Z1231 Encounter for screening mammogram for malignant neoplasm of breast: Secondary | ICD-10-CM

## 2019-10-21 DIAGNOSIS — G8929 Other chronic pain: Secondary | ICD-10-CM

## 2019-10-21 DIAGNOSIS — Z23 Encounter for immunization: Secondary | ICD-10-CM

## 2019-10-21 DIAGNOSIS — I129 Hypertensive chronic kidney disease with stage 1 through stage 4 chronic kidney disease, or unspecified chronic kidney disease: Secondary | ICD-10-CM

## 2019-10-21 DIAGNOSIS — E2839 Other primary ovarian failure: Secondary | ICD-10-CM

## 2019-10-21 DIAGNOSIS — M25512 Pain in left shoulder: Secondary | ICD-10-CM | POA: Diagnosis not present

## 2019-10-21 DIAGNOSIS — H3411 Central retinal artery occlusion, right eye: Secondary | ICD-10-CM

## 2019-10-21 DIAGNOSIS — E785 Hyperlipidemia, unspecified: Secondary | ICD-10-CM

## 2019-10-21 LAB — GLUCOSE, POCT (MANUAL RESULT ENTRY): POC Glucose: 228 mg/dl — AB (ref 70–99)

## 2019-10-21 LAB — POCT GLYCOSYLATED HEMOGLOBIN (HGB A1C): HbA1c, POC (controlled diabetic range): 10.3 % — AB (ref 0.0–7.0)

## 2019-10-21 MED ORDER — LANTUS SOLOSTAR 100 UNIT/ML ~~LOC~~ SOPN: 22.0000 [IU] | PEN_INJECTOR | Freq: Two times a day (BID) | SUBCUTANEOUS | 3 refills | Status: DC

## 2019-10-21 MED ORDER — METFORMIN HCL 500 MG PO TABS: 1000.0000 mg | ORAL_TABLET | Freq: Two times a day (BID) | ORAL | 6 refills | Status: DC

## 2019-10-21 MED ORDER — CLOPIDOGREL BISULFATE 75 MG PO TABS: 75.0000 mg | ORAL_TABLET | Freq: Every day | ORAL | 1 refills | Status: DC

## 2019-10-21 MED ORDER — LOSARTAN POTASSIUM-HCTZ 100-25 MG PO TABS: 1.0000 | ORAL_TABLET | Freq: Every day | ORAL | 1 refills | Status: DC

## 2019-10-21 MED ORDER — BENZONATATE 100 MG PO CAPS: 100.0000 mg | ORAL_CAPSULE | Freq: Two times a day (BID) | ORAL | 1 refills | Status: DC | PRN

## 2019-10-21 MED ORDER — ATORVASTATIN CALCIUM 80 MG PO TABS: 80.0000 mg | ORAL_TABLET | Freq: Every day | ORAL | 1 refills | Status: DC

## 2019-10-21 MED FILL — METFORMIN HCL 500 MG TABS: 500 | 30 days supply | Qty: 120 | Fill #0

## 2019-10-21 NOTE — Progress Notes (Signed)
Patient presents for vaccination against influenza and strep pneumo per orders of Dr. Margarita Rana. Consent given. Counseling provided. No contraindications exists. Vaccine administered without incident.   Benard Halsted, PharmD, [place] [phone]

## 2019-10-21 NOTE — Progress Notes (Signed)
States that she still has a cough and would like a cough syrup.  Not sleeping good  Information about booster vaccine.

## 2019-10-21 NOTE — Patient Instructions (Signed)
Diclofenac skin gel What is this medicine? DICLOFENAC (dye KLOE fen ak) is a non-steroidal anti-inflammatory drug (NSAID). The 1% skin gel is used to treat osteoarthritis of the hands or knees. The 3% skin gel is used to treat actinic keratosis. This medicine may be used for other purposes; ask your health care provider or pharmacist if you have questions. COMMON BRAND NAME(S): DSG Pak, Omeca, Solaravix, Solaraze, Voltaren Arthritis, Voltaren Gel What should I tell my health care provider before I take this medicine? They need to know if you have any of these conditions:  asthma  bleeding problems  coronary artery bypass graft (CABG) surgery within the past 2 weeks  heart disease  high blood pressure  if you frequently drink alcohol containing drinks  kidney disease  liver disease  open or infected skin  stomach problems  an unusual or allergic reaction to diclofenac, aspirin, other NSAIDs, other medicines, benzyl alcohol (3% gel only), foods, dyes, or preservatives  pregnant or trying to get pregnant  breast-feeding How should I use this medicine? This medicine is for external use only. Follow the directions on the prescription label. Wash hands before and after use. Do not get this medicine in your eyes. If you do, rinse out with plenty of cool tap water. Use your doses at regular intervals. Do not use your medicine more often than directed. A special MedGuide will be given to you by the pharmacist with each prescription and refill of the 1% gel. Be sure to read this information carefully each time. Talk to your pediatrician regarding the use of this medicine in children. Special care may be needed. The 3% gel is not approved for use in children. Overdosage: If you think you have taken too much of this medicine contact a poison control center or emergency room at once. NOTE: This medicine is only for you. Do not share this medicine with others. What if I miss a dose? If you  miss a dose, use it as soon as you can. If it is almost time for your next dose, use only that dose. Do not use double or extra doses. What may interact with this medicine?  aspirin  NSAIDs, medicines for pain and inflammation, like ibuprofen or naproxen Do not use any other skin products without telling your doctor or health care professional. This list may not describe all possible interactions. Give your health care provider a list of all the medicines, herbs, non-prescription drugs, or dietary supplements you use. Also tell them if you smoke, drink alcohol, or use illegal drugs. Some items may interact with your medicine. What should I watch for while using this medicine? Tell your doctor or healthcare provider if your symptoms do not start to get better or if they get worse. You will need to follow up with your healthcare provider to monitor your progress. You may need to be treated for up to 3 months if you are using the 3% gel, but the full effect may not occur until 1 month after stopping treatment. If you develop a severe skin reaction, contact your doctor or healthcare provider immediately. This medicine may cause serious skin reactions. They can happen weeks to months after starting the medicine. Contact your healthcare provider right away if you notice fevers or flu-like symptoms with a rash. The rash may be red or purple and then turn into blisters or peeling of the skin. Or, you might notice a red rash with swelling of the face, lips or lymph nodes in  your neck or under your arms. This medicine can make you more sensitive to the sun. Keep out of the sun. If you cannot avoid being in the sun, wear protective clothing and use sunscreen. Do not use sun lamps or tanning beds/booths. Do not take medicines such as ibuprofen and naproxen with this medicine. Side effects such as stomach upset, nausea, or ulcers may be more likely to occur. Many medicines available without a prescription should not  be taken with this medicine. This medicine does not prevent heart attack or stroke. In fact, this medicine may increase the chance of a heart attack or stroke. The chance may increase with longer use of this medicine and in people who have heart disease. If you take aspirin to prevent heart attack or stroke, talk with your doctor or healthcare provider. This medicine can cause ulcers and bleeding in the stomach and intestines at any time during treatment. Do not smoke cigarettes or drink alcohol. These increase irritation to your stomach and can make it more susceptible to damage from this medicine. Ulcers and bleeding can happen without warning symptoms and can cause death. You may get drowsy or dizzy. Do not drive, use machinery, or do anything that needs mental alertness until you know how this medicine affects you. Do not stand or sit up quickly, especially if you are an older patient. This reduces the risk of dizzy or fainting spells. This medicine can cause you to bleed more easily. Try to avoid damage to your teeth and gums when you brush or floss your teeth. What side effects may I notice from receiving this medicine? Side effects that you should report to your doctor or health care professional as soon as possible:  allergic reactions like skin rash, itching or hives, swelling of the face, lips, or tongue  black or bloody stools, blood in the urine or vomit  blurred vision  chest pain  difficulty breathing or wheezing  nausea or vomiting  rash, fever, and swollen lymph nodes  redness, blistering, peeling or loosening of the skin, including inside the mouth  slurred speech or weakness on one side of the body  trouble passing urine or change in the amount of urine  unexplained weight gain or swelling  unusually weak or tired  yellowing of eyes or skin Side effects that usually do not require medical attention (report to your doctor or health care professional if they continue  or are bothersome):  dizziness  dry skin  headache  heartburn  increased sensitivity to the sun  stomach pain  tingling at the application site This list may not describe all possible side effects. Call your doctor for medical advice about side effects. You may report side effects to FDA at 1-800-FDA-1088. Where should I keep my medicine? Keep out of the reach of children. Store the 1% gel at room temperature between 15 and 30 degrees C (59 and 86 degrees F). Store the 3% gel at room temperature between 20 and 25 degrees C (68 and 77 degrees F). Protect from light. Throw away any unused medicine after the expiration date. NOTE: This sheet is a summary. It may not cover all possible information. If you have questions about this medicine, talk to your doctor, pharmacist, or health care provider.  2020 [employer]vier/Gold Standard (2018-03-21 13:05:18)

## 2019-10-21 NOTE — Progress Notes (Signed)
Name: Adrienne [name]   MRN: [id number]    DOB: [birth year]   Date:10/21/2019       Progress Note  Subjective  Chief Complaint  Chief Complaint  Patient presents with  . Diabetes    HPI  Adrienne [name] a 64 year old female with a history of uncontrolled type 2 diabetes mellitus, anemia, central retinal artery occlusion ( followed by [facility]), glaucoma, chronic kidney disease stage 3b, and hypertension.  Diabetes: Patient reports eating more carbohydrate, mostly breads since her foods are prepared by daughter. She reports her blood sugar ranges 90-200. Her daughter reads her blood sugars to her as she is blind. She reports hypoglycemia at times.This is mostly when she eats a light dinner and wakes up with low blood sugars in the morning. She is currently on metformin, but she  was taking in the morning 2 in the morning and 2 in the evening (2,000 mg) instead of 1 in the morning and 1 in the evening (total 1,000 mg) up until 09/30/19. She now reports taking a 1,000 mg total of metformin daily along with Lantus 22 units twice daily. She is following with opthalmology at [facility] for her diabetic retinopathy with central retinal vein occlusion of the right eye. She denies any numbness or tingling in her extremities.  Hypertension: Reports eating low-salt diet. Tries to exercise by doing stretches daily. She is on losartan-hydrochlorothiazide 100-25mg  daily and reports adherence to medication.  Cough: She reports having a cough since she was diagnosed with COVID-19 in January. She reports having some sputum production with cough. Takes Robitussin DM every day to help with cough with no improvement. She denies chest pain, shortness of breath, fever, or chills.   Shoulder pain: Patient reports some shoulder pain, mostly in her right shoulder. She reports this has been ongoing for some years. Denies any injury to the area.  She is also wondering if she can get the COVID-19 booster,  influenza vaccine, and pneumonia vaccine.    Patient Active Problem List   Diagnosis Date Noted  . Pneumonia due to COVID-19 virus 02/15/2019  . CKD (chronic kidney disease) stage 3, GFR 30-59 ml/min (HCC) 02/15/2019  . CVA (cerebral vascular accident) ([place]) 02/15/2019  . Acute respiratory failure with hypoxia ([place]) 02/14/2019  . Acute respiratory failure due to COVID-19 ([place]) 02/14/2019  . ARF (acute renal failure) ([place]) 02/14/2019  . DM (diabetes mellitus), type 2 with renal complications ([place]) 48/18/5631  . GERD (gastroesophageal reflux disease) 01/03/2018  . Blindness 10/04/2017  . Vitamin D deficiency 08/24/2017  . Gastroparesis 05/04/2016  . Anemia 10/05/2015  . Fall 09/06/2015  . DKA (diabetic ketoacidoses) 09/06/2015  . Hyperkalemia 09/05/2015  . Essential hypertension 12/20/2013  . Diabetes mellitus ([place]) 11/19/2013  . Other hyperlipidemia 11/19/2013  . Central retinal artery occlusion of right eye 11/05/2013    Past Medical History:  Diagnosis Date  . Diabetes ([place])   . High cholesterol   . Hypertension     Past Surgical History:  Procedure Laterality Date  . CATARACT EXTRACTION, BILATERAL     Laser eye surgery, cataract surgery bi-lat  . CESAREAN SECTION     Tw[place], [place]  . EYE SURGERY Bilateral 2012   LASER, CATARACT  . TEE WITHOUT CARDIOVERSION N/A 12/31/2013   Procedure: TRANSESOPHAGEAL ECHOCARDIOGRAM (TEE);  Surgeon: Laverda Page, MD;  Location: [facility];  Service: Cardiovascular;  Laterality: N/A;  H&P in file  . TUBAL LIGATION Bilateral     Social History   Tobacco Use  .  Smoking status: Former Research scientist (life sciences)  . Smokeless tobacco: Never Used  . Tobacco comment: QUIT IN 1987  Substance Use Topics  . Alcohol use: No    Alcohol/week: 0.0 standard drinks    Comment: QUIT IN 1987     Current Outpatient Medications:  .  acetaminophen (TYLENOL) 500 MG tablet, Take 500 mg by mouth every 6 (six) hours as needed for moderate pain., Disp: , Rfl:  .   aspirin EC 81 MG tablet, Take 81 mg by mouth daily. , Disp: , Rfl:  .  atorvastatin (LIPITOR) 80 MG tablet, Take 1 tablet (80 mg total) by mouth daily., Disp: 90 tablet, Rfl: 1 .  cholecalciferol (VITAMIN D3) 25 MCG (1000 UNIT) tablet, Take 1 tablet (1,000 Units total) by mouth daily., Disp: 30 tablet, Rfl: 3 .  clopidogrel (PLAVIX) 75 MG tablet, Take 1 tablet (75 mg total) by mouth daily., Disp: 90 tablet, Rfl: 1 .  Continuous Blood Gluc Sensor (FREESTYLE LIBRE 14 DAY SENSOR) MISC, PLACE ONE SENSOR AND USE TO CHECK BLOOD SUGAR FOR 14 DAYS THEN REMOVE AND REPLACE., Disp: 2 each, Rfl: 2 .  famotidine (PEPCID) 20 MG tablet, Take 1 tablet (20 mg total) by mouth 2 (two) times daily., Disp: 180 tablet, Rfl: 1 .  fluticasone (FLONASE) 50 MCG/ACT nasal spray, PLACE 2 SPRAYS INTO BOTH NOSTRILS DAILY., Disp: 16 g, Rfl: 6 .  insulin glargine (LANTUS SOLOSTAR) 100 UNIT/ML Solostar Pen, Inject 22 Units into the skin 2 (two) times daily., Disp: 21 mL, Rfl: 3 .  Insulin Pen Needle 31G X 5 MM MISC, 1 Act by Does not apply route 4 (four) times daily - after meals and at bedtime., Disp: 100 each, Rfl: 11 .  losartan-hydrochlorothiazide (HYZAAR) 100-25 MG tablet, Take 1 tablet by mouth daily., Disp: 90 tablet, Rfl: 1 .  metFORMIN (GLUCOPHAGE) 500 MG tablet, Take 2 tablets (1,000 mg total) by mouth 2 (two) times daily with a meal., Disp: 120 tablet, Rfl: 6 .  Misc. Devices MISC, Blood pressure monitor. Dx: Hypertension, Disp: 1 each, Rfl: 0 .  benzonatate (TESSALON) 100 MG capsule, Take 1 capsule (100 mg total) by mouth 2 (two) times daily as needed for cough., Disp: 30 capsule, Rfl: 1 .  clotrimazole (GYNE-LOTRIMIN) 1 % vaginal cream, Place 1 Applicatorful vaginally at bedtime. (Patient not taking: Reported on 04/03/2019), Disp: 45 g, Rfl: 0 .  guaiFENesin-dextromethorphan (ROBITUSSIN DM) 100-10 MG/5ML syrup, Take 5 mLs by mouth every 6 (six) hours as needed for cough. (Patient not taking: Reported on 03/18/2019), Disp:  118 mL, Rfl: 0  Allergies  Allergen Reactions  . Codeine Itching  . Hydrocodone Itching  . Oxycodone Itching  . Penicillins Itching  . Clindamycin/Lincomycin Itching  . Niacin And Related Itching  . Penicillin G Itching  . Niacin Itching and Anxiety    Facial flushing    Review of Systems  Constitutional: Negative for chills, fever and malaise/fatigue.  HENT: Negative for sinus pain and sore throat.   Eyes: Negative for pain.       Blind  Respiratory: Positive for cough and sputum production. Negative for shortness of breath and wheezing.   Cardiovascular: Negative for chest pain, palpitations and leg swelling.  Gastrointestinal: Positive for constipation. Negative for abdominal pain, heartburn and vomiting.  Musculoskeletal: Negative for back pain and myalgias.       Shoulder pain  Neurological: Negative for dizziness, focal weakness and headaches.  Psychiatric/Behavioral: Negative for memory loss. The patient is not nervous/anxious.     No  other specific complaints in a complete review of systems (except as listed in HPI above).  Objective  Vitals:   10/21/19 1352  BP: 128/72  Pulse: 85  SpO2: 100%  Weight: 193 lb (87.5 kg)  Height: 5\' 6"  (1.676 m)    Body mass index is 31.15 kg/m.  Nursing Note and Vital Signs reviewed.  Physical Exam Constitutional:      Appearance: Normal appearance.  Eyes:     Comments: Wearing dark lens glasses  Cardiovascular:     Rate and Rhythm: Normal rate and regular rhythm.     Pulses: Normal pulses.          Dorsalis pedis pulses are 2+ on the right side and 2+ on the left side.       Posterior tibial pulses are 2+ on the right side and 2+ on the left side.     Heart sounds: Normal heart sounds.  Pulmonary:     Effort: Pulmonary effort is normal. No respiratory distress.     Breath sounds: Normal breath sounds. No wheezing or rhonchi.  Abdominal:     General: Bowel sounds are normal.     Palpations: Abdomen is soft.      Tenderness: There is no abdominal tenderness. There is no guarding.  Musculoskeletal:     Right shoulder: Normal. No swelling or deformity. Normal range of motion.     Left shoulder: Normal. No swelling or deformity. Normal range of motion.     Right lower leg: No edema.     Left lower leg: No edema.     Right foot: Normal range of motion. No bunion.     Left foot: Normal range of motion. No bunion.  Feet:     Right foot:     Protective Sensation: 10 sites tested. 10 sites sensed.     Skin integrity: Skin integrity normal.     Toenail Condition: Right toenails are normal.     Left foot:     Protective Sensation: 10 sites tested. 10 sites sensed.     Skin integrity: Skin integrity normal.     Toenail Condition: Left toenails are normal.  Skin:    General: Skin is warm and dry.  Neurological:     Mental Status: She is alert and oriented to person, place, and time.  Psychiatric:        Attention and Perception: Attention and perception normal.        Mood and Affect: Mood normal.        Behavior: Behavior normal.        Thought Content: Thought content normal.      Results for orders placed or performed in visit on 10/21/19 (from the past 48 hour(s))  POCT glucose (manual entry)     Status: Abnormal   Collection Time: 10/21/19  1:57 PM  Result Value Ref Range   POC Glucose 228 (A) 70 - 99 mg/dl  POCT glycosylated hemoglobin (Hb A1C)     Status: Abnormal   Collection Time: 10/21/19  2:04 PM  Result Value Ref Range   Hemoglobin A1C     HbA1c POC (<> result, manual entry)     HbA1c, POC (prediabetic range)     HbA1c, POC (controlled diabetic range) 10.3 (A) 0.0 - 7.0 %   CMP     Component Value Date/Time   NA 141 05/07/2019 1049   K 3.8 05/07/2019 1049   CL 99 05/07/2019 1049   CO2 20 05/07/2019 1049  GLUCOSE 81 05/07/2019 1049   GLUCOSE 180 (H) 02/18/2019 0612   BUN 27 05/07/2019 1049   CREATININE 1.72 (H) 05/07/2019 1049   CREATININE 0.85 01/04/2016 1138   CALCIUM  10.0 05/07/2019 1049   PROT 6.7 05/07/2019 1049   ALBUMIN 4.4 05/07/2019 1049   AST 14 05/07/2019 1049   ALT 13 05/07/2019 1049   ALKPHOS 95 05/07/2019 1049   BILITOT 0.3 05/07/2019 1049   GFRNONAA 31 (L) 05/07/2019 1049   GFRNONAA 74 01/04/2016 1138   GFRAA 36 (L) 05/07/2019 1049   GFRAA 86 01/04/2016 1138   Lipid Panel     Component Value Date/Time   CHOL 202 (H) 05/07/2019 1049   TRIG 122 05/07/2019 1049   HDL 46 05/07/2019 1049   CHOLHDL 4.4 05/07/2019 1049   CHOLHDL 2.7 01/04/2016 1138   VLDL 12 01/04/2016 1138   LDLCALC 134 (H) 05/07/2019 1049   LABVLDL 22 05/07/2019 1049   CBC    Component Value Date/Time   WBC 9.6 02/13/2019 2030   RBC 3.14 (L) 02/13/2019 2030   HGB 9.6 (L) 02/13/2019 2030   HCT 29.2 (L) 02/13/2019 2030   PLT 346 02/13/2019 2030   MCV 93.0 02/13/2019 2030   MCH 30.6 02/13/2019 2030   MCHC 32.9 02/13/2019 2030   RDW 12.6 02/13/2019 2030   LYMPHSABS 1.0 02/13/2019 2030   MONOABS 0.4 02/13/2019 2030   EOSABS 0.0 02/13/2019 2030   BASOSABS 0.0 02/13/2019 2030    Diabetic Foot Exam - Simple   Simple Foot Form Diabetic Foot exam was performed with the following findings: Yes 10/21/2019  2:30 AM  Visual Inspection No deformities, no ulcerations, no other skin breakdown bilaterally: Yes Sensation Testing Intact to touch and monofilament testing bilaterally: Yes Pulse Check Posterior Tibialis and Dorsalis pulse intact bilaterally: Yes Comments    Assessment & Plan  1. Type 2 diabetes mellitus with other specified complication, with long-term current use of insulin (HCC) Uncontrolled with A1c 10.3 Using shared decision-making with patient and increased metformin to 1000 mg twice a day. She reports she will decrease her carbohydrate intake by reducing breads in her diet. She will also tell her daughter to prepare foods with less carbohydrates. She believes with diet change and medication adjustment, she will be able to improve her A1c.   Discussed with patient goal A1c of less than 7, pre-prandial blood sugars 80-120, and random blood sugar less than 180. Encouraged patient to continue to keep a log of blood sugars, and to notify for blood sugars less than 60 or greater than 400. Foot exam completed, eye exam up to date Will repeat A1c in 3 months   - POCT glucose (manual entry) - POCT glycosylated hemoglobin (Hb A1C) - metFORMIN (GLUCOPHAGE) 500 MG tablet; Take 2 tablets (1,000 mg total) by mouth 2 (two) times daily with a meal.  Dispense: 120 tablet; Refill: 6 - insulin glargine (LANTUS SOLOSTAR) 100 UNIT/ML Solostar Pen; Inject 22 Units into the skin 2 (two) times daily.  Dispense: 21 mL; Refill: 3 - clopidogrel (PLAVIX) 75 MG tablet; Take 1 tablet (75 mg total) by mouth daily.  Dispense: 90 tablet; Refill: 1  2. 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 Follow-up on lab to assess electrolytes and kidney function - Basic Metabolic Panel - losartan-hydrochlorothiazide (HYZAAR) 100-25 MG tablet; Take 1 tablet by mouth daily.  Dispense:  90 tablet; Refill: 1  3. Hyperlipidemia associated with type 2 diabetes mellitus ([place]) Labs reviewed from 05/07/19 with elevated LDL (goal less than 100) and cholesterol. Recommend low-cholesterol diet and medication adherence. Plan to repeat lipid panel at next visit. - clopidogrel (PLAVIX) 75 MG tablet; Take 1 tablet (75 mg total) by mouth daily.  Dispense: 90 tablet; Refill: 1 - atorvastatin (LIPITOR) 80 MG tablet; Take 1 tablet (80 mg total) by mouth daily.  Dispense: 90 tablet; Refill: 1  4. Cough Uncontrolled-has had a cough since COVID-19 in January Robitussin DM ineffective, will try Tessalon to see if it will alleviate cough. - benzonatate (TESSALON) 100 MG capsule; Take 1 capsule (100 mg  total) by mouth 2 (two) times daily as needed for cough.  Dispense: 30 capsule; Refill: 1  5. Chronic left shoulder pain Recommend OTC creams, such as Voltaren gel to help with shoulder pain.  6. Central retinal artery occlusion of right eye Continue following with [facility].    Health maintenance: Instructed patient she can receive COVID-19 booster if it has been at least 6 months since her second dose. Educated patient that she should receive both Prevnar 38 and Pneumovax 23 after the age 76 at least one year apart starting with Prevnar 33. Patient received influenza and Prevnar 13 vaccines today by pharmacist. Patient states she will try to schedule colonoscopy screening. Bone density and mammogram ordered.  Return in about 3 months (around 01/21/2020) for chronic disease management.   Evaluation and management procedures were performed by me with NP Student in attendance, note written by NP student under my supervision and collaboration. I have reviewed the note and I agree with the management and plan.  Ms Petrucelli has had a jump in her A1c from 7.8 to 10.3 largely due to dietary indiscretion and also due to a decrease in Metformin dose from 1000mg  bid to 500mg  bid. Due to her occasional hypoglycemic episodes I will hold off on increasing Lantus dose. She has been encouraged to eat an evening snack to prevent early morning hypoglycemias.  Charlott Rakes, MD, FAAFP. [facility] [place], [place]   10/21/2019, 6:19 PM

## 2019-10-22 LAB — BASIC METABOLIC PANEL
BUN/Creatinine Ratio: 11 — ABNORMAL LOW (ref 12–28)
BUN: 19 mg/dL (ref 8–27)
CO2: 23 mmol/L (ref 20–29)
Calcium: 9.6 mg/dL (ref 8.7–10.3)
Chloride: 101 mmol/L (ref 96–106)
Creatinine, Ser: 1.67 mg/dL — ABNORMAL HIGH (ref 0.57–1.00)
GFR calc Af Amer: 37 mL/min/{1.73_m2} — ABNORMAL LOW (ref >59)
GFR calc non Af Amer: 32 mL/min/{1.73_m2} — ABNORMAL LOW (ref >59)
Glucose: 271 mg/dL — ABNORMAL HIGH (ref 65–99)
Potassium: 4.3 mmol/L (ref 3.5–5.2)
Sodium: 139 mmol/L (ref 134–144)

## 2019-10-23 ENCOUNTER — Other Ambulatory Visit: Payer: Self-pay | Admitting: *Deleted

## 2019-10-23 NOTE — Patient Outreach (Signed)
[facility]) Care Management  10/23/2019  Adrienne [name] Nov 15, 1955 [id number]  CSW attempting to reach pt's Vocational Counselor at [employer] for the [place]. Message left as well as email sent.   [name], MSW, [place] Worker  [place] [phone]

## 2019-10-28 ENCOUNTER — Telehealth: Payer: Self-pay | Admitting: Family Medicine

## 2019-10-28 NOTE — Telephone Encounter (Signed)
Copied from [place] [phone]. Topic: General - Other >> Oct 28, 2019 10:57 AM Alanda Slim E wrote: Reason for CRM: Pt asked for Elmo Putt to give her a call about some vitamin D meds and Cough medicine that was prescribed to her before/ please advise asap

## 2019-10-29 ENCOUNTER — Telehealth: Payer: Self-pay

## 2019-10-29 NOTE — Telephone Encounter (Signed)
Patient was called and a voicemail was left informing patient to return phone call. 

## 2019-10-29 NOTE — Telephone Encounter (Signed)
Patient is requesting to get a refill on Vit D medication sent to [facility].

## 2019-10-30 ENCOUNTER — Other Ambulatory Visit: Payer: Self-pay | Admitting: *Deleted

## 2019-10-30 MED ORDER — VITAMIN D 25 MCG (1000 UNIT) PO TABS: 1000.0000 [IU] | ORAL_TABLET | Freq: Every day | ORAL | 3 refills | Status: AC

## 2019-10-30 NOTE — Telephone Encounter (Signed)
Done

## 2019-10-30 NOTE — Patient Outreach (Signed)
[facility]) Care Management  10/30/2019  Adrienne [name] 1955/02/02 [id number]   CSW spoke with pt who was in good spirits; no news on job search.  CSW advised pt that the counselor from the [employer] was to mail her a form to sign giving them consent to discuss her case with me.  Pt is unsure if this has arrived.  CSW offered to email the counselor and ask that she call pt to get verbal consent given her blindness and limited availability of family to assist her with going through her mail.  CSW also offered to ask if the paperwork she is to complete for "[employer]" might be emailed to CSW to aide in completion by phone with pt.  Pt appreciative.  CSW will plan a follow up call to pt next week for updates on above.     Adrienne [name], MSW, [place] Worker  [place] [phone]

## 2019-10-30 NOTE — Addendum Note (Signed)
Addended by: Charlott Rakes on: 10/30/2019 10:16 AM   Modules accepted: Orders

## 2019-10-31 ENCOUNTER — Other Ambulatory Visit: Payer: Self-pay

## 2019-10-31 ENCOUNTER — Other Ambulatory Visit: Payer: Self-pay | Admitting: *Deleted

## 2019-10-31 NOTE — Patient Outreach (Signed)
[facility]) Care Management  10/31/2019  Adrienne [name] 06-21-1955 [id number]  Telephone outreach. Unable to talk with pt but left a message and requested a return call.  [name] returned my call today. She is in very good spirits today. 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 She was rather excited about this. She is also hoping to hear from [place].  She says she has not been following her diet very well and her HgbA1C went up to 10.3 this time from 7.8. She admits to eating a lot of bread.   She rarely checks her home glucose. Just once in awhile these days because she knows she has not been compliant with her diet.  We discussed what she eats on a usual day, how many carbs each meal had and how she can cut down on her carbs. She does recognize what foods are carbs. Her barriers are that she cannot see and she depends on someone else to shop for her which is her son.  Notified pt that she knows what to do, she just needs to decide she will stick to it. I am transitioning her to a health coach at this time, with the expectations they will call her in 6 months.  [name]. [name], MSN, [facility] Gerontological Nurse Practitioner [facility] Care Management [phone]

## 2019-10-31 NOTE — Patient Outreach (Signed)
[facility]) Care Management  10/31/2019  Adrienne [name] January 03, 1956 [id number]  Called patient today to complete satisfaction survey. Left message to call back  [place] Management Assistant [phone]

## 2019-10-31 NOTE — Telephone Encounter (Signed)
Patient was called and a voicemail was left informing patient that medication has been sent to pharmacy. °

## 2019-11-01 ENCOUNTER — Other Ambulatory Visit: Payer: Self-pay | Admitting: *Deleted

## 2019-11-04 ENCOUNTER — Other Ambulatory Visit: Payer: Self-pay | Admitting: *Deleted

## 2019-11-05 NOTE — Patient Outreach (Signed)
[facility]) Care Management  11/05/2019  Adrienne [name] 1955-03-13 [id number]   CSW made contact with pt on 11/04/2019 who provided updates from a recent call she received from her [employer] counselor.  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  Pt is considering this.  CSW provided pt with info on a job fair for persons with disabilities.  CSW will investigate this further and update pt.  Pt plans to see if she can find someone to go with her to assist.   Pt is also interested in someone to help with cleaning her home as well as some home repairs. CSW will seek options for her with this as well,however, reminded pt that grant funded programs are limited and even more so due to the pandemic.      [name], MSW, [place] Worker  [place] [phone]

## 2019-11-07 ENCOUNTER — Other Ambulatory Visit: Payer: Self-pay | Admitting: *Deleted

## 2019-11-07 NOTE — Patient Outreach (Signed)
[facility]) Care Management  11/07/2019  Adrienne [name] Aug 02, 1955 [id number]  CSW spoke with pt who is planning to get someone to go with her to the [place] being offered next week for persons with disabilities.  CSW assisted pt with completion of signup and pt is working to secure a friend or family member to join her at the job fair to assist (visually).   CSW will mail pt copies of her resume to take with her and will plan to touch base again on Monday.    [name], MSW, [place] Worker  [place] [phone]

## 2019-11-11 ENCOUNTER — Other Ambulatory Visit: Payer: Self-pay | Admitting: *Deleted

## 2019-11-12 NOTE — Patient Outreach (Signed)
[facility]) Care Management  11/12/2019  Adrienne [name] 01/23/1955 [id number]   CSW spoke with pt and updated her on the [place] that she hopes to attend tomorrow. She is still looking for a ride and companion.  I did advise her that the [employer] rep indicate they will have volunteers there to help and she can connect with them at registration.  "I hope the [employer] driver will help to the registration desk. CSW will plan to alert the [employer] rep of this plan in hopes they can look out for her arrival via [employer].   Pt states, "I'm excited.... sometimes its the little things that mean so much".  CSW reminded her this could be big too!   CSW will touch base tomorrow again for updates-   [name], MSW, [place] Worker  [place] [phone]

## 2019-11-13 DIAGNOSIS — E119 Type 2 diabetes mellitus without complications: Secondary | ICD-10-CM | POA: Diagnosis not present

## 2019-11-13 DIAGNOSIS — Z794 Long term (current) use of insulin: Secondary | ICD-10-CM | POA: Diagnosis not present

## 2019-11-14 ENCOUNTER — Other Ambulatory Visit: Payer: Self-pay | Admitting: *Deleted

## 2019-11-14 NOTE — Patient Outreach (Signed)
[facility]) Care Management  11/14/2019  Lateya Dauria 1955-10-14 [id number]   CSW spoke with pt by phone today who reports she went to the [place] yesterday and was overall pleased.  "I am supposed to have a phone interview today".  Pt had not heard yet but based on a link/interaction at the [place] is anticipating a call today.  Pt also was pleased with the volunteer/help offered as she walked around the site.  She was also able to visit some agency/services tables for info and help.   CSW offered words of encouragement to pt and plans to follow up with her again next week for further updates.   [name], MSW, [place] Worker  [place] [phone]

## 2019-11-19 MED FILL — METFORMIN HCL 500 MG TABS: 500 | 90 days supply | Qty: 360 | Fill #1

## 2019-11-19 MED FILL — LANTUS SOLOSTAR 100 UNITS/M: 100 | 87 days supply | Qty: 39 | Fill #0

## 2019-11-20 ENCOUNTER — Other Ambulatory Visit: Payer: Self-pay | Admitting: *Deleted

## 2019-11-20 NOTE — Patient Outreach (Signed)
[place] [facility]) Care Management  11/20/2019  Adrienne [name] 1955/04/12 [id number]  CSW spoke with pt today by phone who reports she has had a phone interview for program in [place] that provides vocational support.  She is now awaiting word back.  CSW assisted pt with getting message to her local [employer] rep to make them aware pt is willing to do in-person training or remote.   CSW awaits email reply and will then call pt back to update. Otherwise, pt denies any concerns at this time. She is awaiting call for hopeful in home repairs as well as housecleaning.  CSW stressed to pt that due to the [place] pandemic, many resources are exhausted of funds, short staffed and/or not currently active.   [name], MSW, [place] Worker  [place] Saenz [phone]

## 2019-11-28 ENCOUNTER — Ambulatory Visit: Payer: Self-pay | Admitting: *Deleted

## 2019-12-04 ENCOUNTER — Ambulatory Visit: Payer: Self-pay | Admitting: *Deleted

## 2019-12-05 ENCOUNTER — Other Ambulatory Visit: Payer: Self-pay | Admitting: *Deleted

## 2019-12-05 NOTE — Patient Instructions (Signed)
Goals Addressed            This Visit's Progress   . (THN)Make and Keep All Appointments       Follow Up Date [id number]   - ask family or friend for a ride - call to cancel if needed - keep a calendar with prescription refill dates - keep a calendar with appointment dates    Why is this important?   Part of staying healthy is seeing the doctor for follow-up care.  If you forget your appointments, there are some things you can do to stay on track.    Notes:     . (THN)Monitor and Manage My Blood Sugar       Follow Up Date [id number]   - check blood sugar at prescribed times - check blood sugar if I feel it is too high or too low - take the blood sugar log to all doctor visits - take the blood sugar meter to all doctor visits    Why is this important?   Checking your blood sugar at home helps to keep it from getting very high or very low.  Writing the results in a diary or log helps the doctor know how to care for you.  Your blood sugar log should have the time, date and the results.  Also, write down the amount of insulin or other medicine that you take.  Other information, like what you ate, exercise done and how you were feeling, will also be helpful.     Notes:  Discussed with patient about face timing son with meter so he can tell her what her readings are. Patient is totally blind    . (THN)Perform Foot Care       Follow Up Date [id number]   - trim toenails straight across - wash and dry feet carefully every day - wear comfortable, cotton socks - wear comfortable, well-fitting shoes    Why is this important?   Good foot care is very important when you have diabetes.  There are many things you can do to keep your feet healthy and catch a problem early.    Notes:     . (THN)Set My Target A1C       Follow Up Date [id number]   - set target A1C    Why is this important?   Your target A1C is decided together by you and your doctor.  It is based on several things like  your age and other health issues.    Notes:  Target is 7.0 but want to see a decrease from 10.3

## 2019-12-05 NOTE — Patient Outreach (Signed)
[place] [facility]) Care Management  12/05/2019  Zykia Walla 1955/06/20 [id number]   CSW attempted to reach pt for updates and was unable. CSW left a HIPPA compliant voice message and will await callback or try again in 3-4 business days.   [name], MSW, [place] Worker  [place] [phone]

## 2019-12-05 NOTE — Patient Outreach (Signed)
[place] [facility]) Care Management  [place]  12/05/2019   Adrienne [name] 26-Sep-1955 [id number]  RN Health Coach telephone call to patient.  Hipaa compliance verified. Per patient she has run out of meter sensors.her A1C is 10.3. Her son will be coming home today and help her put it on. Patient stated she has not been eating the foods she is suppose to. Her appetite is good. Patient stated she has not been exercising. She stated she knows the exercises to do but has not been motivated. Patient does not have a medical alert system. Patient is totally blind. Patient has agreed to RN calling monthly and discussing her diabetes.   Encounter Medications:  Outpatient Encounter Medications as of 12/05/2019  Medication Sig Note  . acetaminophen (TYLENOL) 500 MG tablet Take 500 mg by mouth every 6 (six) hours as needed for moderate pain.   Marland Kitchen aspirin EC 81 MG tablet Take 81 mg by mouth daily.    Marland Kitchen atorvastatin (LIPITOR) 80 MG tablet Take 1 tablet (80 mg total) by mouth daily.   . benzonatate (TESSALON) 100 MG capsule Take 1 capsule (100 mg total) by mouth 2 (two) times daily as needed for cough.   . cholecalciferol (VITAMIN D3) 25 MCG (1000 UNIT) tablet Take 1 tablet (1,000 Units total) by mouth daily.   . clopidogrel (PLAVIX) 75 MG tablet Take 1 tablet (75 mg total) by mouth daily.   . clotrimazole (GYNE-LOTRIMIN) 1 % vaginal cream Place 1 Applicatorful vaginally at bedtime. (Patient not taking: Reported on 04/03/2019)   . Continuous Blood Gluc Sensor (FREESTYLE LIBRE 14 DAY SENSOR) MISC PLACE ONE SENSOR AND USE TO CHECK BLOOD SUGAR FOR 14 DAYS THEN REMOVE AND REPLACE. 04/03/2019: Waiting on new sensor.  . famotidine (PEPCID) 20 MG tablet Take 1 tablet (20 mg total) by mouth 2 (two) times daily.   . fluticasone (FLONASE) 50 MCG/ACT nasal spray PLACE 2 SPRAYS INTO BOTH NOSTRILS DAILY.   Marland Kitchen guaiFENesin-dextromethorphan (ROBITUSSIN DM) 100-10 MG/5ML syrup Take 5 mLs by mouth every 6  (six) hours as needed for cough. (Patient not taking: Reported on 03/18/2019)   . insulin glargine (LANTUS SOLOSTAR) 100 UNIT/ML Solostar Pen Inject 22 Units into the skin 2 (two) times daily.   . Insulin Pen Needle 31G X 5 MM MISC 1 Act by Does not apply route 4 (four) times daily - after meals and at bedtime.   Marland Kitchen losartan-hydrochlorothiazide (HYZAAR) 100-25 MG tablet Take 1 tablet by mouth daily.   . metFORMIN (GLUCOPHAGE) 500 MG tablet Take 2 tablets (1,000 mg total) by mouth 2 (two) times daily with a meal.   . Misc. Devices MISC Blood pressure monitor. Dx: Hypertension    No facility-administered encounter medications on file as of 12/05/2019.    Functional Status:  In your present state of health, do you have any difficulty performing the following activities: 04/03/2019 02/14/2019  Hearing? N -  Vision? Y -  Comment Blind -  Difficulty concentrating or making decisions? N -  Walking or climbing stairs? Y -  Comment Only because of her vision loss. -  Dressing or bathing? N -  Doing errands, shopping? Y Y  Comment Son does this for her. -  Preparing Food and eating ? Y -  Using the Toilet? N -  In the past six months, have you accidently leaked urine? N -  Do you have problems with loss of bowel control? N -  Managing your Medications? Y -  Managing your Finances?  Y -  Housekeeping or managing your Housekeeping? Y -  Some recent data might be hidden    Fall/Depression Screening: Fall Risk  12/05/2019 10/21/2019 04/03/2019  Falls in the past year? 0 0 0  Comment - - Overriding this screen and putting pt in as HIGH RISK due to blindness.  Number falls in past yr: - - 0  Injury with Fall? - - 0  Risk for fall due to : - - Impaired vision  Follow up - - Falls evaluation completed   PHQ 2/9 Scores 04/03/2019 01/03/2018 08/29/2016 06/03/2016 05/04/2016 09/11/2015  PHQ - 2 Score 1 2 3  - - 0  PHQ- 9 Score - 6 10 - - -  Exception Documentation - - - Patient refusal Other- indicate reason  in comment box -  Not completed - - - - patient is unable to fill out because she cannot see. -    Assessment:  Goals Addressed            This Visit's Progress   . (THN)Make and Keep All Appointments       Follow Up Date [id number]   - ask family or friend for a ride - call to cancel if needed - keep a calendar with prescription refill dates - keep a calendar with appointment dates    Why is this important?   Part of staying healthy is seeing the doctor for follow-up care.  If you forget your appointments, there are some things you can do to stay on track.    Notes:     . (THN)Monitor and Manage My Blood Sugar       Follow Up Date [id number]   - check blood sugar at prescribed times - check blood sugar if I feel it is too high or too low - take the blood sugar log to all doctor visits - take the blood sugar meter to all doctor visits    Why is this important?   Checking your blood sugar at home helps to keep it from getting very high or very low.  Writing the results in a diary or log helps the doctor know how to care for you.  Your blood sugar log should have the time, date and the results.  Also, write down the amount of insulin or other medicine that you take.  Other information, like what you ate, exercise done and how you were feeling, will also be helpful.     Notes:  Discussed with patient about face timing son with meter so he can tell her what her readings are. Patient is totally blind    . (THN)Perform Foot Care       Follow Up Date [id number]   - trim toenails straight across - wash and dry feet carefully every day - wear comfortable, cotton socks - wear comfortable, well-fitting shoes    Why is this important?   Good foot care is very important when you have diabetes.  There are many things you can do to keep your feet healthy and catch a problem early.    Notes:     . (THN)Set My Target A1C       Follow Up Date [id number]   - set target A1C    Why  is this important?   Your target A1C is decided together by you and your doctor.  It is based on several things like your age and other health issues.    Notes:  Target is 7.0 but want  to see a decrease from 10.3       Plan:  Patient will see a decrease in A1C from 10.3 with next blood draw Patient will face time son to tell her the readings on her meter. RN will teach by patient by verbally explaining and reading her information Patient will follow up with appointments Patient will monitor the bread, pasta and starch intake RN called homebound pharmacy and arranged for them to come to patient home to administer COVID booster RN assisted patient to help her determine her benefits with her insurance broker such as benefit products and medical alert RN will follow up within the month of December RN sent assessment and Barrier letter to PCP  [place] Management [phone]

## 2019-12-09 ENCOUNTER — Other Ambulatory Visit: Payer: Self-pay | Admitting: *Deleted

## 2019-12-09 NOTE — Patient Outreach (Signed)
[place] [facility]) Care Management  12/09/2019  Adrienne [name] 03-04-1955 [id number]   CSW made contact with pt and updated her on the recent call CSW had with Seth Bake with [employer].  Pt looking forward to the planned in-person training at the [employer] in [place] where she will receive training and skills for employment.  Pt is aware and excited. She is now awaiting word from them as to when.   Pt reports plans to spend Thanksgiving at a friend's home; " we are ordering [place] food".     CSW plans to reach out to pt again in the next 2 weeks for updates and further inquiries with the [employer].    [name], MSW, [place] Worker  [place] [phone]

## 2019-12-10 DIAGNOSIS — E119 Type 2 diabetes mellitus without complications: Secondary | ICD-10-CM | POA: Diagnosis not present

## 2019-12-10 DIAGNOSIS — Z794 Long term (current) use of insulin: Secondary | ICD-10-CM | POA: Diagnosis not present

## 2019-12-19 ENCOUNTER — Other Ambulatory Visit: Payer: Self-pay | Admitting: *Deleted

## 2019-12-19 NOTE — Patient Outreach (Signed)
[facility]) Care Management  12/19/2019  Adrienne [name] 1955/02/01 [id number]   CSW spoke with pt who reports having "chinese food and apple pie at a friends home" for Thanksgiving. She has not heard back from the [employer] in regards to the vocational training that will be provided in [place].  CSW offered to call for pt and inquire when/if she desires.   Pt also reports she is now ready to get her Booster shot and does not remember who was coordinating this.  CSW reviewed notes that indicate Johny Shock, RN, [facility], had made referral. CSW will follow up with Joaquim Lai, RN, to inquire about this arrangement for pt.   CSW will touch base with pt again in the next 2 weeks.    [name], MSW, [place] Worker  [place] [phone]

## 2019-12-31 ENCOUNTER — Ambulatory Visit: Payer: Self-pay | Admitting: *Deleted

## 2020-01-01 ENCOUNTER — Other Ambulatory Visit: Payer: Self-pay | Admitting: *Deleted

## 2020-01-01 MED FILL — CLOPIDOGREL 75 MG TABLET: 75 | 90 days supply | Qty: 90 | Fill #0

## 2020-01-01 MED FILL — ATORVASTATIN CALCIUM 80 MG: 80 | 90 days supply | Qty: 90 | Fill #0

## 2020-01-01 MED FILL — LOSARTAN-HCTZ 100-25 MG TAB: 100-25 | 90 days supply | Qty: 90 | Fill #0

## 2020-01-01 NOTE — Patient Outreach (Signed)
[place] [facility]) Care Management  01/01/2020  Cinthya Bors 02/24/1955 [id number]   CSW attempted phone outreach to pt on yesterday and was unable to reach- no voice mail either.  CSW will attempt to reach pt again in 3-4 business days. Pt may be in [place] for her vocational training through [employer].   [name], MSW, [place] Worker  [place] [phone]

## 2020-01-03 ENCOUNTER — Other Ambulatory Visit: Payer: Self-pay | Admitting: *Deleted

## 2020-01-03 NOTE — Patient Outreach (Addendum)
[place] [facility]) Care Management  01/03/2020  Adrienne [name] 09/09/1955 [id number]   CSW spoke with pt who is still awaiting training in [place] through the [employer].  CSW suggested follow up inquiry at the beginning of new year if no word is received before than.  Pt denies any current concerns or needs at this time; she is optimistic and hopeful for a job soon.   CSW will touch base with pt in 2-3 weeks.   [name], MSW, [place] Worker  [place] [phone]

## 2020-01-08 ENCOUNTER — Other Ambulatory Visit: Payer: Self-pay | Admitting: *Deleted

## 2020-01-08 NOTE — Patient Outreach (Signed)
[place] [facility] Care Management  01/08/2020  Adrienne [name] 1955/08/13 [id number]  Referral from Johny Shock, RN for medication assistance sent to [place].  Ina Homes [facility] Management Assistant [phone]

## 2020-01-08 NOTE — Patient Outreach (Signed)
[facility]) Care Management  [facility] Care Manager  01/08/2020   Lyndzie Zentz 07/15/1955 [id number]  RN Health Coach attempted follow up outreach call to patient.  Patient was unavailable. HIPPA compliance voicemail message left with return callback number. Patent returned call immediately. Per patient her fasting blood sugar was 134. This was non fasting. Her daughter came by her house and read the meter. The patient is blind and having difficulty with finding out what the readings are. RN suggested calling family members and face time so they can tell her what the readings are. RN will refer to pharmacy for the assistance of the free style libre to verbalize the readings. Patient was told that ginger ale helps herr stomach. RN discussed drinking diet ginger ale.Regular ginger ale does increase calories and the blood sugars. Patient stated that she is limiting her breads but eat croissants. RN made other food suggestions.Patient has received her COVID booster vaccine and flu vaccine and pneumonia vaccine. Per patient she is having a hard time buying self care necessities after bills paid. Discussed with Child psychotherapist for some assistance. Patient has agreed to further outreach calls and discussion on health.  Encounter Medications:  Outpatient Encounter Medications as of 01/08/2020  Medication Sig Note   Continuous Blood Gluc Sensor (FREESTYLE LIBRE 14 DAY SENSOR) MISC PLACE ONE SENSOR AND USE TO CHECK BLOOD SUGAR FOR 14 DAYS THEN REMOVE AND REPLACE. 04/03/2019: Waiting on new sensor.   acetaminophen (TYLENOL) 500 MG tablet Take 500 mg by mouth every 6 (six) hours as needed for moderate pain.    aspirin EC 81 MG tablet Take 81 mg by mouth daily.     atorvastatin (LIPITOR) 80 MG tablet Take 1 tablet (80 mg total) by mouth daily.    benzonatate (TESSALON) 100 MG capsule Take 1 capsule (100 mg total) by mouth 2 (two) times daily as needed for cough.    cholecalciferol  (VITAMIN D3) 25 MCG (1000 UNIT) tablet Take 1 tablet (1,000 Units total) by mouth daily.    clopidogrel (PLAVIX) 75 MG tablet Take 1 tablet (75 mg total) by mouth daily.    clotrimazole (GYNE-LOTRIMIN) 1 % vaginal cream Place 1 Applicatorful vaginally at bedtime. (Patient not taking: Reported on 04/03/2019)    famotidine (PEPCID) 20 MG tablet Take 1 tablet (20 mg total) by mouth 2 (two) times daily.    fluticasone (FLONASE) 50 MCG/ACT nasal spray PLACE 2 SPRAYS INTO BOTH NOSTRILS DAILY.    guaiFENesin-dextromethorphan (ROBITUSSIN DM) 100-10 MG/5ML syrup Take 5 mLs by mouth every 6 (six) hours as needed for cough. (Patient not taking: Reported on 03/18/2019)    insulin glargine (LANTUS SOLOSTAR) 100 UNIT/ML Solostar Pen Inject 22 Units into the skin 2 (two) times daily.    Insulin Pen Needle 31G X 5 MM MISC 1 Act by Does not apply route 4 (four) times daily - after meals and at bedtime.    losartan-hydrochlorothiazide (HYZAAR) 100-25 MG tablet Take 1 tablet by mouth daily.    metFORMIN (GLUCOPHAGE) 500 MG tablet Take 2 tablets (1,000 mg total) by mouth 2 (two) times daily with a meal.    Misc. Devices MISC Blood pressure monitor. Dx: Hypertension    No facility-administered encounter medications on file as of 01/08/2020.    Functional Status:  In your present state of health, do you have any difficulty performing the following activities: 04/03/2019 02/14/2019  Hearing? N -  Vision? Y -  Comment Blind -  Difficulty concentrating or making decisions? N -  Walking or climbing stairs? Y -  Comment Only because of her vision loss. -  Dressing or bathing? N -  Doing errands, shopping? Y Y  Comment Son does this for her. -  Preparing Food and eating ? Y -  Using the Toilet? N -  In the past six months, have you accidently leaked urine? N -  Do you have problems with loss of bowel control? N -  Managing your Medications? Y -  Managing your Finances? Y -  Housekeeping or managing your  Housekeeping? Y -  Some recent data might be hidden    Fall/Depression Screening: Fall Risk  01/08/2020 12/05/2019 10/21/2019  Falls in the past year? 0 0 0  Comment - - -  Number falls in past yr: - - -  Injury with Fall? - - -  Risk for fall due to : - - -  Follow up - - -   PHQ 2/9 Scores 04/03/2019 01/03/2018 08/29/2016 06/03/2016 05/04/2016 09/11/2015  PHQ - 2 Score 1 2 3  - - 0  PHQ- 9 Score - 6 10 - - -  Exception Documentation - - - Patient refusal Other- indicate reason in comment box -  Not completed - - - - patient is unable to fill out because she cannot see. -    Assessment:  Goals Addressed            This Visit's Progress    (THN)Make and Keep All Appointments       Timeframe:  Long-Range Goal Priority:  Medium Start Date: 12458099                            Expected End Date:      [id number]                Follow Up Date [id number]   - ask family or friend for a ride - call to cancel if needed - keep a calendar with prescription refill dates - keep a calendar with appointment dates    Why is this important?   Part of staying healthy is seeing the doctor for follow-up care.  If you forget your appointments, there are some things you can do to stay on track.    Notes:      (THN)Monitor and Manage My Blood Sugar       Timeframe:  Short-Term Goal Priority:  High Start Date:   [id number]                          Expected End Date: [id number]                     Follow Up Date [id number]   - check blood sugar at prescribed times - check blood sugar if I feel it is too high or too low - take the blood sugar log to all doctor visits - take the blood sugar meter to all doctor visits    Why is this important?   Checking your blood sugar at home helps to keep it from getting very high or very low.  Writing the results in a diary or log helps the doctor know how to care for you.  Your blood sugar log should have the time, date and the results.  Also, write down the  amount of insulin or other medicine that you take.  Other information, like what you ate,  exercise done and how you were feeling, will also be helpful.     Notes:  Discussed with patient about face timing son with meter so he can tell her what her readings are. Patient is totally blind RN referred to pharmacy for any options to make free style libre to talk the blood sugar readings     (THN)Perform Foot Care       Timeframe:  Long-Range Goal Priority:  Medium Start Date: [id number]                            Expected End Date: [id number]                    Follow Up Date [id number]   - trim toenails straight across - wash and dry feet carefully every day - wear comfortable, cotton socks - wear comfortable, well-fitting shoes    Why is this important?   Good foot care is very important when you have diabetes.  There are many things you can do to keep your feet healthy and catch a problem early.    Notes:      (THN)Set My Target A1C       Timeframe:  Short-Term Goal Priority:  High Start Date:      [id number]                       Expected End Date:        09735329              Follow Up Date 92426834   - set target A1C    Why is this important?   Your target A1C is decided together by you and your doctor.  It is based on several things like your age and other health issues.    Notes:  Target is 7.0 but want to see a decrease from 10.3       Plan:  Follow-up:  Patient agrees to Care Plan and Follow-up. Referred to pharmacy Discussed necessity supplies with social worker RN discussed dietary changes RN will follow up within the month of January RN sent PCP update assessment  [place] Management [phone]

## 2020-01-09 DIAGNOSIS — Z794 Long term (current) use of insulin: Secondary | ICD-10-CM | POA: Diagnosis not present

## 2020-01-09 DIAGNOSIS — E119 Type 2 diabetes mellitus without complications: Secondary | ICD-10-CM | POA: Diagnosis not present

## 2020-01-21 ENCOUNTER — Ambulatory Visit: Payer: Medicare Other | Attending: Family Medicine | Admitting: Family Medicine

## 2020-01-21 ENCOUNTER — Other Ambulatory Visit: Payer: Self-pay | Admitting: Family Medicine

## 2020-01-21 ENCOUNTER — Other Ambulatory Visit: Payer: Self-pay

## 2020-01-21 ENCOUNTER — Encounter: Payer: Self-pay | Admitting: Family Medicine

## 2020-01-21 VITALS — BP 131/78 | HR 89 | Ht 66.0 in | Wt 194.0 lb

## 2020-01-21 DIAGNOSIS — E785 Hyperlipidemia, unspecified: Secondary | ICD-10-CM | POA: Diagnosis not present

## 2020-01-21 DIAGNOSIS — E1122 Type 2 diabetes mellitus with diabetic chronic kidney disease: Secondary | ICD-10-CM

## 2020-01-21 DIAGNOSIS — I129 Hypertensive chronic kidney disease with stage 1 through stage 4 chronic kidney disease, or unspecified chronic kidney disease: Secondary | ICD-10-CM

## 2020-01-21 DIAGNOSIS — Z794 Long term (current) use of insulin: Secondary | ICD-10-CM | POA: Diagnosis not present

## 2020-01-21 DIAGNOSIS — R059 Cough, unspecified: Secondary | ICD-10-CM

## 2020-01-21 DIAGNOSIS — E1169 Type 2 diabetes mellitus with other specified complication: Secondary | ICD-10-CM | POA: Diagnosis not present

## 2020-01-21 DIAGNOSIS — Z1211 Encounter for screening for malignant neoplasm of colon: Secondary | ICD-10-CM

## 2020-01-21 LAB — GLUCOSE, POCT (MANUAL RESULT ENTRY): POC Glucose: 98 mg/dl (ref 70–99)

## 2020-01-21 LAB — POCT GLYCOSYLATED HEMOGLOBIN (HGB A1C): Hemoglobin A1C: 8 % — AB (ref 4.0–5.6)

## 2020-01-21 MED ORDER — LOSARTAN POTASSIUM-HCTZ 100-25 MG PO TABS: 1.0000 | ORAL_TABLET | Freq: Every day | ORAL | 1 refills | Status: DC

## 2020-01-21 MED ORDER — METFORMIN HCL 500 MG PO TABS: 1000.0000 mg | ORAL_TABLET | Freq: Two times a day (BID) | ORAL | 1 refills | Status: DC

## 2020-01-21 MED ORDER — ATORVASTATIN CALCIUM 80 MG PO TABS: 80.0000 mg | ORAL_TABLET | Freq: Every day | ORAL | 1 refills | Status: DC

## 2020-01-21 MED ORDER — LANTUS SOLOSTAR 100 UNIT/ML ~~LOC~~ SOPN: 22.0000 [IU] | PEN_INJECTOR | Freq: Two times a day (BID) | SUBCUTANEOUS | 6 refills | Status: DC

## 2020-01-21 MED ORDER — CLOPIDOGREL BISULFATE 75 MG PO TABS: 75.0000 mg | ORAL_TABLET | Freq: Every day | ORAL | 1 refills | Status: DC

## 2020-01-21 MED ORDER — BENZONATATE 100 MG PO CAPS: 100.0000 mg | ORAL_CAPSULE | Freq: Two times a day (BID) | ORAL | 1 refills | Status: DC | PRN

## 2020-01-21 NOTE — Progress Notes (Signed)
Subjective:  Patient ID: Adrienne [name], female    DOB: 1955-11-27  Age: 65 y.o. 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 She requests PCS services today. Has a dull pain in her L collar bone which resolves when she passes gas which is absent ath this time.  She no longer goes to [facility] but is in the process of applying to school for [employer]. Still has a cough since she had COVID but no dyspnea. Her A1c has improved slightly and she denies hypoglycemic episodes. She has no additional concerns today.  Past Medical History:  Diagnosis Date  . Diabetes ([place])   . High cholesterol   . Hypertension     Past Surgical History:  Procedure Laterality Date  . CATARACT EXTRACTION, BILATERAL     Laser eye surgery, cataract surgery bi-lat  . CESAREAN SECTION     Twins, [place]  . EYE SURGERY Bilateral 2012   LASER, CATARACT  . TEE WITHOUT CARDIOVERSION N/A 12/31/2013   Procedure: TRANSESOPHAGEAL ECHOCARDIOGRAM (TEE);  Surgeon: Laverda Page, MD;  Location: [place];  Service: Cardiovascular;  Laterality: N/A;  H&P in file  . TUBAL LIGATION Bilateral     Family History  Problem Relation Age of Onset  . Dementia Mother   . Diabetes Father   . Hypertension Father   . Hypertension Sister   . Diabetes Sister   . Diabetes Sister   . Heart disease Sister     Allergies  Allergen Reactions  . Codeine Itching  . Hydrocodone Itching  . Oxycodone Itching  . Penicillins Itching  . Clindamycin/Lincomycin Itching  . Niacin And Related Itching  . Penicillin G Itching  . Niacin Itching and Anxiety    Facial  flushing    Outpatient Medications Prior to Visit  Medication Sig Dispense Refill  . acetaminophen (TYLENOL) 500 MG tablet Take 500 mg by mouth every 6 (six) hours as needed for moderate pain.    Marland Kitchen aspirin EC 81 MG tablet Take 81 mg by mouth daily.     Marland Kitchen atorvastatin (LIPITOR) 80 MG tablet Take 1 tablet (80 mg total) by mouth daily. 90 tablet 1  . benzonatate (TESSALON) 100 MG capsule Take 1 capsule (100 mg total) by mouth 2 (two) times daily as needed for cough. 30 capsule 1  . cholecalciferol (VITAMIN D3) 25 MCG (1000 UNIT) tablet Take 1 tablet (1,000 Units total) by mouth daily. 30 tablet 3  . clopidogrel (PLAVIX) 75 MG tablet Take 1 tablet (75 mg total) by mouth daily. 90 tablet 1  . Continuous Blood Gluc Sensor (FREESTYLE LIBRE 14 DAY SENSOR) MISC PLACE ONE SENSOR AND USE TO CHECK BLOOD SUGAR FOR 14 DAYS THEN REMOVE AND REPLACE. 2 each 2  . famotidine (PEPCID) 20 MG tablet Take 1 tablet (20 mg total) by mouth 2 (two) times daily. 180 tablet 1  . fluticasone (FLONASE) 50 MCG/ACT nasal spray PLACE 2 SPRAYS INTO BOTH NOSTRILS DAILY. 16 g 6  . insulin glargine (LANTUS SOLOSTAR) 100 UNIT/ML Solostar Pen Inject 22 Units into the skin 2 (two) times daily. 21 mL 3  . Insulin Pen Needle 31G X 5 MM MISC 1 Act by Does not  apply route 4 (four) times daily - after meals and at bedtime. 100 each 11  . losartan-hydrochlorothiazide (HYZAAR) 100-25 MG tablet Take 1 tablet by mouth daily. 90 tablet 1  . metFORMIN (GLUCOPHAGE) 500 MG tablet Take 2 tablets (1,000 mg total) by mouth 2 (two) times daily with a meal. 120 tablet 6  . Misc. Devices MISC Blood pressure monitor. Dx: Hypertension 1 each 0  . clotrimazole (GYNE-LOTRIMIN) 1 % vaginal cream Place 1 Applicatorful vaginally at bedtime. (Patient not taking: No sig reported) 45 g 0  . guaiFENesin-dextromethorphan (ROBITUSSIN DM) 100-10 MG/5ML syrup Take 5 mLs by mouth every 6 (six) hours as needed for cough. (Patient not taking: No sig reported) 118 mL 0    No facility-administered medications prior to visit.     ROS Review of Systems  Constitutional: Negative for activity change, appetite change and fatigue.  HENT: Negative for congestion, sinus pressure and sore throat.   Eyes: Positive for visual disturbance.  Respiratory: Negative for cough, chest tightness, shortness of breath and wheezing.   Cardiovascular: Negative for chest pain and palpitations.  Gastrointestinal: Negative for abdominal distention, abdominal pain and constipation.  Endocrine: Negative for polydipsia.  Genitourinary: Negative for dysuria and frequency.  Musculoskeletal: Negative for arthralgias and back pain.  Skin: Negative for rash.  Neurological: Negative for tremors, light-headedness and numbness.  Hematological: Does not bruise/bleed easily.  Psychiatric/Behavioral: Negative for agitation and behavioral problems.    Objective:  BP 131/78   Pulse 89   Ht '5\' 6"'  (1.676 m)   Wt 194 lb (88 kg)   SpO2 99%   BMI 31.31 kg/m   BP/Weight 01/21/2020 37/01/6965 08/25/3808  Systolic BP 175 102 585  Diastolic BP 78 72 91  Wt. (Lbs) 194 193 183  BMI 31.31 31.15 -      Physical Exam Constitutional:      Appearance: She is well-developed.  Neck:     Vascular: No JVD.  Cardiovascular:     Rate and Rhythm: Normal rate.     Heart sounds: Normal heart sounds. No murmur heard.   Pulmonary:     Effort: Pulmonary effort is normal.     Breath sounds: Normal breath sounds. No wheezing or rales.  Chest:     Chest wall: No tenderness.  Abdominal:     General: Bowel sounds are normal. There is no distension.     Palpations: Abdomen is soft. There is no mass.     Tenderness: There is no abdominal tenderness.  Musculoskeletal:        General: Normal range of motion.     Right lower leg: No edema.     Left lower leg: No edema.  Neurological:     Mental Status: She is alert and oriented to person, place, and time.  Psychiatric:        Mood and Affect: Mood  normal.     CMP Latest Ref Rng & Units 10/21/2019 05/07/2019 02/18/2019  Glucose 65 - 99 mg/dL 271(H) 81 180(H)  BUN 8 - 27 mg/dL 19 27 61(H)  Creatinine 0.57 - 1.00 mg/dL 1.67(H) 1.72(H) 2.04(H)  Sodium 134 - 144 mmol/L 139 141 139  Potassium 3.5 - 5.2 mmol/L 4.3 3.8 3.4(L)  Chloride 96 - 106 mmol/L 101 99 101  CO2 20 - 29 mmol/L '23 20 26  ' Calcium 8.7 - 10.3 mg/dL 9.6 10.0 8.7(L)  Total Protein 6.0 - 8.5 g/dL - 6.7 6.9  Total Bilirubin 0.0 - 1.2 mg/dL - 0.3 0.7  Alkaline  Phos 39 - 117 IU/L - 95 65  AST 0 - 40 IU/L - 14 25  ALT 0 - 32 IU/L - 13 18    Lipid Panel     Component Value Date/Time   CHOL 202 (H) 05/07/2019 1049   TRIG 122 05/07/2019 1049   HDL 46 05/07/2019 1049   CHOLHDL 4.4 05/07/2019 1049   CHOLHDL 2.7 01/04/2016 1138   VLDL 12 01/04/2016 1138   LDLCALC 134 (H) 05/07/2019 1049    CBC    Component Value Date/Time   WBC 9.6 02/13/2019 2030   RBC 3.14 (L) 02/13/2019 2030   HGB 9.6 (L) 02/13/2019 2030   HCT 29.2 (L) 02/13/2019 2030   PLT 346 02/13/2019 2030   MCV 93.0 02/13/2019 2030   MCH 30.6 02/13/2019 2030   MCHC 32.9 02/13/2019 2030   RDW 12.6 02/13/2019 2030   LYMPHSABS 1.0 02/13/2019 2030   MONOABS 0.4 02/13/2019 2030   EOSABS 0.0 02/13/2019 2030   BASOSABS 0.0 02/13/2019 2030   Lab Results  Component Value Date   HGBA1C 8.0 (A) 01/21/2020     Assessment & Plan:  1. Type 2 diabetes mellitus with other specified complication, with long-term current use of insulin (HCC) Uncontrolled with A1c of 8.0 Due to visual impairment and previous hypoglycemia I will hold off on adjusting her regimen Counseled on Diabetic diet, my plate method, 720 minutes of moderate intensity exercise/week Blood sugar logs with fasting goals of 80-120 mg/dl, random of less than 180 and in the event of sugars less than 60 mg/dl or greater than 400 mg/dl encouraged to notify the clinic. Advised on the need for annual eye exams, annual foot exams, Pneumonia vaccine. - POCT  glucose (manual entry) - POCT glycosylated hemoglobin (Hb A1C) - CMP14+EGFR - metFORMIN (GLUCOPHAGE) 500 MG tablet; Take 2 tablets (1,000 mg total) by mouth 2 (two) times daily with a meal.  Dispense: 360 tablet; Refill: 1 - insulin glargine (LANTUS SOLOSTAR) 100 UNIT/ML Solostar Pen; Inject 22 Units into the skin 2 (two) times daily.  Dispense: 21 mL; Refill: 6 - clopidogrel (PLAVIX) 75 MG tablet; Take 1 tablet (75 mg total) by mouth daily.  Dispense: 90 tablet; Refill: 1  2. Cough Long haul symptom of COVID - benzonatate (TESSALON) 100 MG capsule; Take 1 capsule (100 mg total) by mouth 2 (two) times daily as needed for cough.  Dispense: 30 capsule; Refill: 1  3. Screening for colon cancer - Ambulatory referral to Gastroenterology  4. 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  Dispense: 90 tablet; Refill: 1  5. Hyperlipidemia associated with type 2 diabetes mellitus (HCC) Uncontrolled Low cholesterol diet If Lipids remain elevated consider switching to Crestor - clopidogrel (PLAVIX) 75 MG tablet; Take 1 tablet (75 mg total) by mouth daily.  Dispense: 90 tablet; Refill: 1 - atorvastatin (LIPITOR) 80 MG tablet; Take 1 tablet (80 mg total) by mouth daily.  Dispense: 90 tablet; Refill: 1  We will work on [place] for her.  Health Care Maintenance: has upcoming appointment for Mammogram and Bone density No orders of the defined types were placed in this encounter.   Return in about 3 months (around 04/20/2020) for Chronic disease management.       Charlott Rakes, MD, FAAFP. [place] and [place], [place]  [phone]   01/21/2020, 3:20 PM

## 2020-01-21 NOTE — Progress Notes (Signed)
Has a dull pain in collar bone area. Would like to get aid to help at home.

## 2020-01-22 LAB — CMP14+EGFR
ALT: 18 IU/L (ref 0–32)
AST: 15 IU/L (ref 0–40)
Albumin/Globulin Ratio: 1.7 (ref 1.2–2.2)
Albumin: 4.7 g/dL (ref 3.8–4.8)
Alkaline Phosphatase: 99 IU/L (ref 44–121)
BUN/Creatinine Ratio: 18 (ref 12–28)
BUN: 31 mg/dL — ABNORMAL HIGH (ref 8–27)
Bilirubin Total: 0.3 mg/dL (ref 0.0–1.2)
CO2: 22 mmol/L (ref 20–29)
Calcium: 10.1 mg/dL (ref 8.7–10.3)
Chloride: 100 mmol/L (ref 96–106)
Creatinine, Ser: 1.74 mg/dL — ABNORMAL HIGH (ref 0.57–1.00)
GFR calc Af Amer: 35 mL/min/{1.73_m2} — ABNORMAL LOW (ref >59)
GFR calc non Af Amer: 30 mL/min/{1.73_m2} — ABNORMAL LOW (ref >59)
Globulin, Total: 2.7 g/dL (ref 1.5–4.5)
Glucose: 78 mg/dL (ref 65–99)
Potassium: 4 mmol/L (ref 3.5–5.2)
Sodium: 138 mmol/L (ref 134–144)
Total Protein: 7.4 g/dL (ref 6.0–8.5)

## 2020-01-23 ENCOUNTER — Other Ambulatory Visit: Payer: Self-pay | Admitting: *Deleted

## 2020-01-23 ENCOUNTER — Other Ambulatory Visit: Payer: Self-pay | Admitting: Family Medicine

## 2020-01-23 ENCOUNTER — Telehealth: Payer: Self-pay

## 2020-01-23 ENCOUNTER — Telehealth: Payer: Self-pay | Admitting: Family Medicine

## 2020-01-23 MED ORDER — FLUCONAZOLE 150 MG PO TABS: 150.0000 mg | ORAL_TABLET | Freq: Once | ORAL | 1 refills | Status: DC

## 2020-01-23 MED FILL — FLUCONAZOLE 150 MG TABLET: 150 | 1 days supply | Qty: 1 | Fill #0

## 2020-01-23 NOTE — Telephone Encounter (Signed)
-----   Message from Hoy Register, MD sent at 01/22/2020  1:15 PM EST ----- Please advise her that her liver function is normal, kidney function is abnormal but stable. No regimen changes need to be made and optimization of her Diabetes will go a long way to keeping kidney function stable.

## 2020-01-23 NOTE — Telephone Encounter (Signed)
Patient called to to speak to [facility] about her lab results she is not able to access her VM. Please call Ph# [phone]

## 2020-01-23 NOTE — Telephone Encounter (Signed)
Pt is requesting a Diflucan refill.

## 2020-01-23 NOTE — Telephone Encounter (Signed)
Done

## 2020-01-23 NOTE — Telephone Encounter (Signed)
Pt was called and informed of lab results via VM. 

## 2020-01-23 NOTE — Telephone Encounter (Signed)
Pt was called and given lab results and informed of medication being sent to pharmacy.

## 2020-01-23 NOTE — Telephone Encounter (Signed)
Pt called and asked to speak with the nurse and stated she forgot to ask Dr. Alvis Lemmings for Diflucan for a yeast infection/ please advise

## 2020-01-24 NOTE — Patient Outreach (Signed)
[facility]) Care Management  01/24/2020  [name] August 31, 1955 [id number]   CSW made contact with pt on 01/23/2020. Pt reports needing help with her power bill.  CSW offered to seek support/options for assistance and call her back. CSW provided pt with the # to call the [employer] program to see if they can assist.   CSW also making referral to [facility] 360 platform for options within their program.  CSW also suggested to pt that she may consider looking into credit counseling and the possible thought to refinance to lower her interest rate and mortgage payments.    CSW will plan to touch base with pt again next week.   [name], MSW, [place] Worker  [place] [phone]

## 2020-01-28 ENCOUNTER — Other Ambulatory Visit: Payer: Self-pay | Admitting: *Deleted

## 2020-01-28 DIAGNOSIS — H543 Unqualified visual loss, both eyes: Secondary | ICD-10-CM

## 2020-01-28 NOTE — Patient Outreach (Signed)
[facility]) Care Management  01/28/2020  Adrienne [name] [birth year] [id number]   CSW spoke with pt who confided that she is unable to pay her upcoming power and gas bills. Pt has shared her scenario and due to limited income will be referred to Care Guide support for investigating options for assistance through non-profits and other agencies.   Pt is still hoping to find work; given her blindness she is limited but states, "I've not given up yet".  She is optimistic that something will arise.   CSW offered support and will touch base with pt again next week for updates as well will seek update from the [employer] on her vocational training.    [name], MSW, [place] Worker  [place] [phone]

## 2020-01-30 ENCOUNTER — Telehealth: Payer: Self-pay | Admitting: Family Medicine

## 2020-01-30 NOTE — Telephone Encounter (Signed)
   Telephone encounter was:  Successful.  01/30/2020 Name: Adrienne [name] MRN: [id number] DOB: 03/11/1955  Adrienne [name] is a 65 y.o. year old female who is a primary care patient of Charlott Rakes, MD . The community resource team was consulted for assistance with Financial Difficulties related to high utility bills  Care guide performed the following interventions: Patient provided with information about care guide support team and interviewed to confirm resource needs Discussed resources to assist with help paying for utility bills and connection with services for the disabled in the area. Patient cannot provide me with a current gas bill or electric bill because they haven't been generated yet. I will call her next week and we will do a three way call so that the company can email me a bill directly so that I may submit it to a program. .  Follow Up Plan:  Care guide will follow up with patient by phone over the next week and Care guide will outreach resources to assist patient with utility bill help and any program for the blind help that she may recieve. Pt stated she would like to work but due to the fact that she is blind she cannot find a job. Will try and connect her to the [facility].   [place], Care Management Phone: [phone] Email: [email or web address]

## 2020-02-06 ENCOUNTER — Other Ambulatory Visit: Payer: Self-pay | Admitting: *Deleted

## 2020-02-07 DIAGNOSIS — Z794 Long term (current) use of insulin: Secondary | ICD-10-CM | POA: Diagnosis not present

## 2020-02-07 DIAGNOSIS — E119 Type 2 diabetes mellitus without complications: Secondary | ICD-10-CM | POA: Diagnosis not present

## 2020-02-08 NOTE — Patient Outreach (Signed)
Care Management Clinical Social Work Note  02/08/2020 Name: Adrienne [name] MRN: [id number] DOB: Mar 06, 1955  Adrienne [name] is a 65 y.o. year old female who is a primary care patient of Charlott Rakes, MD.  The Care Management team was consulted for assistance with chronic disease management and coordination needs.  Engaged with patient by telephone for follow up visit.  Pt reports talking with Care Guide last week and is anticipating follow up this week.  Pt eager and hopeful to get financial assistance with her power/gas bills.  CSW encouraged pt to check voicemail and listen for incoming calls so to not miss their call.  CSW plans to inquire with [employer] for updates on their plans for vocational training in [place]. Advanced Directives Status: Not addressed in this encounter.  Care Plan  Allergies  Allergen Reactions  . Codeine Itching  . Hydrocodone Itching  . Oxycodone Itching  . Penicillins Itching  . Clindamycin/Lincomycin Itching  . Niacin And Related Itching  . Penicillin G Itching  . Niacin Itching and Anxiety    Facial flushing    Outpatient Encounter Medications as of 02/06/2020  Medication Sig Note  . acetaminophen (TYLENOL) 500 MG tablet Take 500 mg by mouth every 6 (six) hours as needed for moderate pain.   Marland Kitchen aspirin EC 81 MG tablet Take 81 mg by mouth daily.    Marland Kitchen atorvastatin (LIPITOR) 80 MG tablet Take 1 tablet (80 mg total) by mouth daily.   . benzonatate (TESSALON) 100 MG capsule Take 1 capsule (100 mg total) by mouth 2 (two) times daily as needed for cough.   . cholecalciferol (VITAMIN D3) 25 MCG (1000 UNIT) tablet Take 1 tablet (1,000 Units total) by mouth daily.   . clopidogrel (PLAVIX) 75 MG tablet Take 1 tablet (75 mg total) by mouth daily.   . clotrimazole (GYNE-LOTRIMIN) 1 % vaginal cream Place 1 Applicatorful vaginally at bedtime. (Patient not taking: No sig reported)   . Continuous Blood Gluc Sensor (FREESTYLE LIBRE 14 DAY SENSOR) MISC  PLACE ONE SENSOR AND USE TO CHECK BLOOD SUGAR FOR 14 DAYS THEN REMOVE AND REPLACE. 04/03/2019: Waiting on new sensor.  . famotidine (PEPCID) 20 MG tablet Take 1 tablet (20 mg total) by mouth 2 (two) times daily.   . fluticasone (FLONASE) 50 MCG/ACT nasal spray PLACE 2 SPRAYS INTO BOTH NOSTRILS DAILY.   Marland Kitchen guaiFENesin-dextromethorphan (ROBITUSSIN DM) 100-10 MG/5ML syrup Take 5 mLs by mouth every 6 (six) hours as needed for cough. (Patient not taking: No sig reported)   . insulin glargine (LANTUS SOLOSTAR) 100 UNIT/ML Solostar Pen Inject 22 Units into the skin 2 (two) times daily.   . Insulin Pen Needle 31G X 5 MM MISC 1 Act by Does not apply route 4 (four) times daily - after meals and at bedtime.   Marland Kitchen losartan-hydrochlorothiazide (HYZAAR) 100-25 MG tablet Take 1 tablet by mouth daily.   . metFORMIN (GLUCOPHAGE) 500 MG tablet Take 2 tablets (1,000 mg total) by mouth 2 (two) times daily with a meal.   . Misc. Devices MISC Blood pressure monitor. Dx: Hypertension    No facility-administered encounter medications on file as of 02/06/2020.    Patient Active Problem List   Diagnosis Date Noted  . Pneumonia due to COVID-19 virus 02/15/2019  . CKD (chronic kidney disease) stage 3, GFR 30-59 ml/min (HCC) 02/15/2019  . CVA (cerebral vascular accident) ([place]) 02/15/2019  . Acute respiratory failure with hypoxia ([place]) 02/14/2019  . Acute respiratory failure due to COVID-19 [facility])  02/14/2019  . ARF (acute renal failure) ([place]) 02/14/2019  . DM (diabetes mellitus), type 2 with renal complications ([place]) 37/10/6267  . GERD (gastroesophageal reflux disease) 01/03/2018  . Blindness 10/04/2017  . Vitamin D deficiency 08/24/2017  . Gastroparesis 05/04/2016  . Anemia 10/05/2015  . Fall 09/06/2015  . DKA (diabetic ketoacidoses) 09/06/2015  . Hyperkalemia 09/05/2015  . Essential hypertension 12/20/2013  . Diabetes mellitus ([place]) 11/19/2013  . Other hyperlipidemia 11/19/2013  . Central retinal artery occlusion of  right eye 11/05/2013    Conditions to be addressed/monitored: Depression and isolation; vocational and financial needs     Follow Up Plan: SW will follow up with patient by phone over the next 2 weeks  [name], MSW, [place] Worker  [place] [phone]

## 2020-02-11 ENCOUNTER — Telehealth: Payer: Self-pay | Admitting: Family Medicine

## 2020-02-11 ENCOUNTER — Other Ambulatory Visit: Payer: Self-pay | Admitting: *Deleted

## 2020-02-11 NOTE — Patient Outreach (Addendum)
[facility]) Care Management  [place]  02/11/2020   Jeanni Allshouse 1955-05-18 [id number]  RN Health Coach telephone call to patient.  [employer] compliance verified. Per patient she has not checked her blood sugar yet. She is waiting on someone to read the meter. Patient stated her appetite is good.She is getting meals on wheels.Patient A1C is down to  8.0. from 10.3. Per patient she is trying to eat better. Patient receives meals on wheels. Patient has agreed to further outreach calls.  Encounter Medications:  Outpatient Encounter Medications as of 02/11/2020  Medication Sig Note  . acetaminophen (TYLENOL) 500 MG tablet Take 500 mg by mouth every 6 (six) hours as needed for moderate pain.   Marland Kitchen aspirin EC 81 MG tablet Take 81 mg by mouth daily.    Marland Kitchen atorvastatin (LIPITOR) 80 MG tablet Take 1 tablet (80 mg total) by mouth daily.   . benzonatate (TESSALON) 100 MG capsule Take 1 capsule (100 mg total) by mouth 2 (two) times daily as needed for cough.   . cholecalciferol (VITAMIN D3) 25 MCG (1000 UNIT) tablet Take 1 tablet (1,000 Units total) by mouth daily.   . clopidogrel (PLAVIX) 75 MG tablet Take 1 tablet (75 mg total) by mouth daily.   . clotrimazole (GYNE-LOTRIMIN) 1 % vaginal cream Place 1 Applicatorful vaginally at bedtime. (Patient not taking: No sig reported)   . Continuous Blood Gluc Sensor (FREESTYLE LIBRE 14 DAY SENSOR) MISC PLACE ONE SENSOR AND USE TO CHECK BLOOD SUGAR FOR 14 DAYS THEN REMOVE AND REPLACE. 04/03/2019: Waiting on new sensor.  . famotidine (PEPCID) 20 MG tablet Take 1 tablet (20 mg total) by mouth 2 (two) times daily.   . fluticasone (FLONASE) 50 MCG/ACT nasal spray PLACE 2 SPRAYS INTO BOTH NOSTRILS DAILY.   Marland Kitchen guaiFENesin-dextromethorphan (ROBITUSSIN DM) 100-10 MG/5ML syrup Take 5 mLs by mouth every 6 (six) hours as needed for cough. (Patient not taking: No sig reported)   . insulin glargine (LANTUS SOLOSTAR) 100 UNIT/ML Solostar Pen Inject 22  Units into the skin 2 (two) times daily.   . Insulin Pen Needle 31G X 5 MM MISC 1 Act by Does not apply route 4 (four) times daily - after meals and at bedtime.   Marland Kitchen losartan-hydrochlorothiazide (HYZAAR) 100-25 MG tablet Take 1 tablet by mouth daily.   . metFORMIN (GLUCOPHAGE) 500 MG tablet Take 2 tablets (1,000 mg total) by mouth 2 (two) times daily with a meal.   . Misc. Devices MISC Blood pressure monitor. Dx: Hypertension    No facility-administered encounter medications on file as of 02/11/2020.    Functional Status:  In your present state of health, do you have any difficulty performing the following activities: 04/03/2019 02/14/2019  Hearing? N -  Vision? Y -  Comment Blind -  Difficulty concentrating or making decisions? N -  Walking or climbing stairs? Y -  Comment Only because of her vision loss. -  Dressing or bathing? N -  Doing errands, shopping? Y Y  Comment Son does this for her. -  Preparing Food and eating ? Y -  Using the Toilet? N -  In the past six months, have you accidently leaked urine? N -  Do you have problems with loss of bowel control? N -  Managing your Medications? Y -  Managing your Finances? Y -  Housekeeping or managing your Housekeeping? Y -  Some recent data might be hidden    Fall/Depression Screening: Fall Risk  02/11/2020 01/21/2020 01/08/2020  Falls in the past year? 0 0 0  Comment - - -  Number falls in past yr: 0 0 -  Injury with Fall? 0 0 -  Risk for fall due to : - - -  Follow up - - -   PHQ 2/9 Scores 04/03/2019 01/03/2018 08/29/2016 06/03/2016 05/04/2016 09/11/2015  PHQ - 2 Score 1 2 3  - - 0  PHQ- 9 Score - 6 10 - - -  Exception Documentation - - - Patient refusal Other- indicate reason in comment box -  Not completed - - - - patient is unable to fill out because she cannot see. -    Assessment:  Goals Addressed            This Visit's Progress   . (THN)Make and Keep All Appointments   On track    Timeframe:  Long-Range  Goal Priority:  Medium Start Date: [id number]                            Expected End Date:      [id number]                Follow Up Date [id number]   - ask family or friend for a ride - call to cancel if needed - keep a calendar with prescription refill dates - keep a calendar with appointment dates    Why is this important?   Part of staying healthy is seeing the doctor for follow-up care.  If you forget your appointments, there are some things you can do to stay on track.    Notes:  Patient kept appt [id number] Next appointments scheduled 0302 Gastro and 0304 Mammogram and Bone density    . (THN)Monitor and Manage My Blood Sugar   Not on track    Timeframe:  Short-Term Goal Priority:  High Start Date:   [id number]                          Expected End [id number]              Follow Up Date [id number]   - check blood sugar at prescribed times - check blood sugar if I feel it is too high or too low - take the blood sugar log to all doctor visits - take the blood sugar meter to all doctor visits    Why is this important?   Checking your blood sugar at home helps to keep it from getting very high or very low.  Writing the results in a diary or log helps the doctor know how to care for you.  Your blood sugar log should have the time, date and the results.  Also, write down the amount of insulin or other medicine that you take.  Other information, like what you ate, exercise done and how you were feeling, will also be helpful.     Notes:  Discussed with patient about face timing son with meter so he can tell her what her readings are. Patient is totally blind RN referred to pharmacy for any options to make free style libre to talk the blood sugar readings Patient has not gotten app on phone for verbal readings of CBG    . (THN)Perform Foot Care   On track    Timeframe:  Long-Range Goal Priority:  Medium Start Date: [id number]  Expected End Date: [id number]                     Follow Up Date [id number]   - trim toenails straight across - wash and dry feet carefully every day - wear comfortable, cotton socks - wear comfortable, well-fitting shoes    Why is this important?   Good foot care is very important when you have diabetes.  There are many things you can do to keep your feet healthy and catch a problem early.    Notes:     . (THN)Set My Target A1C   On track    Timeframe:  Short-Term Goal Priority:  High Start Date:      [id number]                       Expected End Date:        [id number]              Follow Up Date [id number]   - set target A1C    Why is this important?   Your target A1C is decided together by you and your doctor.  It is based on several things like your age and other health issues.    Notes:  Target is 7.0 but want to see a decrease from 10.3 01/25 A1C has decreased to 8.0       Plan:  Follow-up:  Patient agrees to Care Plan and Follow-up. Discussed getting her free style libre sugars read Patient will have Mammogram and bone density on 03/20/2020 Patient has a gastrology appointment on 03/18/2020 Discussed ordering from catalog for products Discussed A1C progression and goal Discussed healthy eating and Meals on Wheels Patient will follow up with PCP for PCS services RN sent update assessment to  PCP  RN will follow up within the month of April  Dollene Mallery [facility] Management [phone]

## 2020-02-11 NOTE — Telephone Encounter (Signed)
Copied from [place] [phone]. Topic: General - Inquiry >> Feb 11, 2020 10:44 AM [name] D wrote: Reason for CRM: Pt is calling in regards to a request for her to have a home health aid due to her being blind.  CB#  [phone]

## 2020-02-11 NOTE — Telephone Encounter (Signed)
I will get CM to check on status form was faxed over on 01/24/20

## 2020-02-11 NOTE — Telephone Encounter (Signed)
   Telephone encounter was:  Successful.  02/11/2020 Name: Adrienne [name] MRN: [id number] DOB: 04-07-1955  Adrienne [name] is a 65 y.o. year old female who is a primary care patient of Charlott Rakes, MD . The community resource team was consulted for assistance with Financial Difficulties related to paying bills.   Care guide performed the following interventions: Discussed resources to assist with LIEAP program application over the phone.  Placed referral to [facility] via email and application sent in by mail. .  Follow Up Plan:  Care guide will follow up with patient by phone over the next week.   [place], Care Management Phone: [phone] Email: [email or web address]

## 2020-02-12 NOTE — Telephone Encounter (Signed)
The patient does not have medicaid to qualify for PCS.  This CM spoke to the patient and she said she is not eligible for medicaid as she is over income.  Explained to her that services provided by her [facility] Medicare would only  be short term if she qualified. She was not aware of any support that the [facility] would provide as far as personal care assistance. She was agreeable to having this CM contact [name] Caldwell/LCSW/THN to inquire about resources.  Staff message sent to Ms Marcelline Deist.

## 2020-02-13 ENCOUNTER — Telehealth: Payer: Self-pay | Admitting: Family Medicine

## 2020-02-13 NOTE — Telephone Encounter (Signed)
   Telephone encounter was:  Unsuccessful.  02/13/2020 Name: Adrienne [name] MRN: [id number] DOB: 02-Apr-1955  Unsuccessful outbound call made today to assist with:  Financial Difficulties related to bills and also her needing more help with her diability.   Outreach Attempt:  1st Attempt  A HIPAA compliant voice message was left requesting a return call.  Instructed patient to call back at [phone].Because of her blindness, her phone automatically answers for her when she is not on the phone because she cannot see the button that will pick up the call. I called twice and this happened, I am thinking she may have fallen asleep or is in the restroom.  [place], Care Management Phone: [phone] Email: [email or web address]

## 2020-02-13 NOTE — Telephone Encounter (Signed)
   Telephone encounter was:  Successful.  02/13/2020 Name: Tresa Jolley MRN: [id number] DOB: Aug 18, 1955  Adrienne [name] is a 65 y.o. year old female who is a primary care patient of Charlott Rakes, MD . The community resource team was consulted for assistance with disability services and utility bill help.   Care guide performed the following interventions: Investigation of community resources performed Follow up call placed to community resources to determine status of patients referral Follow up call placed to the patient to discuss status of referral I explained to patient that she is in the system with [facility] and that all her needs to keep her in her home for as long as possible with her disability can be handled through the [facility]. She understands that they have teh LIEAP form that we filled out the other day to the [facility] for them to file for her and they can help her get an in home care person as well. If they can't then she can call the doctors office for a home health referral. She does not qualify due to income for Medicaid and they are the only ones that offer a program for in home care paid for by insurance. .  Follow Up Plan:  No further follow up planned at this time. The patient has been provided with needed resources. and patient knows how to reach [place] if she finds that the [facility] is not helping her with her resource needs.   [place], Care Management Phone: [phone] Email: [email or web address]

## 2020-02-17 MED FILL — LANTUS SOLOSTAR 100 UNITS/M: 100 | 87 days supply | Qty: 39 | Fill #0

## 2020-02-17 MED FILL — METFORMIN HCL 500 MG TABS: 500 | 90 days supply | Qty: 360 | Fill #0

## 2020-02-19 ENCOUNTER — Other Ambulatory Visit: Payer: Self-pay | Admitting: *Deleted

## 2020-02-19 NOTE — Patient Outreach (Signed)
[facility]) Care Management  02/19/2020  Aneya Daddona March 27, 1955 [id number]   [place] spoke with pt today by phone who reports Gregary Signs, the [place], was able to connect her to an "agency that helps disabled" and she is waiting for word from them about assistance with utilities, etc.  Per pt, she received a call from someone indicating she had an appointment with them tomorrow but she was unaware.  CSW will ask Gregary Signs to contact pt to clarify this plan and communication as well as other possible resources to assist with her outstanding utility and gas bills.  Pt also shared she has not heard from the [employer] rep again.  CSW will call to seek updates.    [name], MSW, [place] Worker  [place] [phone]

## 2020-02-21 ENCOUNTER — Telehealth: Payer: Self-pay

## 2020-02-21 NOTE — Telephone Encounter (Signed)
Information received from Staci Righter, Care Guide.  She has connected the patient to DAC-Disability advocacy center and  has filled out the [employer] application with them and they are going to be working with her moving forward. They are also working with her to find a job. The [facility] may be able to assist her with finding an in home aide.    Call placed to the patient and she said she has been in contact with [facility] about employment but has not discussed in home aide services yet.  Encouraged her to discuss this issue with them when she speaks to them again.  If she has any questions, informed her that she can contact this CM and she said she understood.

## 2020-03-10 ENCOUNTER — Other Ambulatory Visit: Payer: Self-pay | Admitting: *Deleted

## 2020-03-10 DIAGNOSIS — H3411 Central retinal artery occlusion, right eye: Secondary | ICD-10-CM

## 2020-03-10 DIAGNOSIS — H543 Unqualified visual loss, both eyes: Secondary | ICD-10-CM

## 2020-03-10 NOTE — Patient Outreach (Signed)
[facility]) Care Management  03/10/2020  Eilah Common [birth year] [id number]   CSW made contact with pt today who has reports she has not been able to receive any financial assistance with her gas bill(s) and is seeking help.  Pt is inquiring about other possible programs that may offer emergency assistance. CSW will ask the Care Guide to reach out to pt to assist with this need as well as other needs mentioned by pt today; to include non- Medicaid in home support, vocational opportunities as well as a new plumbing issue she does not feel she can call to get fixed due to her money situation..  Pt reports doing ok; has not heard back from the [employer] rep regarding planned training in [place].  CSW will seek answers and link pt with support/services in hopes of getting her the help she needs as stated above.   CSW will update pt with info as received as well as place referral for Care Guide assistance.  [name], MSW, [place] Worker  [place] [phone]

## 2020-03-11 ENCOUNTER — Other Ambulatory Visit: Payer: Self-pay | Admitting: *Deleted

## 2020-03-11 DIAGNOSIS — E1110 Type 2 diabetes mellitus with ketoacidosis without coma: Secondary | ICD-10-CM

## 2020-03-11 DIAGNOSIS — Z794 Long term (current) use of insulin: Secondary | ICD-10-CM | POA: Diagnosis not present

## 2020-03-11 DIAGNOSIS — E119 Type 2 diabetes mellitus without complications: Secondary | ICD-10-CM | POA: Diagnosis not present

## 2020-03-12 ENCOUNTER — Telehealth: Payer: Self-pay | Admitting: Family Medicine

## 2020-03-12 NOTE — Telephone Encounter (Signed)
   Telephone encounter was:  Successful.  03/12/2020 Name: Cassidi Modesitt MRN: [id number] DOB: [birth year]  Rita Vialpando is a 65 y.o. year old female who is a primary care patient of Charlott Rakes, MD . The community resource team was consulted for assistance with Transportation Needs  and Financial Difficulties related to utility bills, home repairs and in home health aid.   Care guide performed the following interventions: Patient provided with information about care guide support team and interviewed to confirm resource needs Investigation of community resources performed Discussed resources to assist with transportation, I called [employer] with pt on the phone to set up a ride for the appointment on the 4th. Transportation has been set up. Also spoke to [facility] today to get an update on her LIEAP forms and I was told that she is in line with 223 other applicants and that they go in alphabetical order. That DSS only helps with emergency funds of someone who has a cut off notice only. I also called [employer] and was told that the patient's home is very much over the $159,000 property value that they are not allowed to exceed. I have a message with Independent living and [place] to see if they can help patient with plumbing issues.  Obtained verbal consent to place patient referral to [place]. .  Follow Up Plan:  Care guide will follow up with patient by phone over the next week and Care guide will outreach resources to assist patient with helping find an organization to help with her plumbing issues.   [place], Care Management Phone: [phone] Email: [email or web address]

## 2020-03-13 ENCOUNTER — Telehealth: Payer: Self-pay | Admitting: Family Medicine

## 2020-03-13 NOTE — Telephone Encounter (Signed)
Patient understands that she needs to call [employer] back when she needs a ride home from the doctors visits- [phone]

## 2020-03-16 ENCOUNTER — Telehealth: Payer: Self-pay | Admitting: Family Medicine

## 2020-03-16 NOTE — Telephone Encounter (Signed)
° °  Telephone encounter was:  Successful.  03/16/2020 Name: Adrienne [name] MRN: [id number] DOB: 12-04-1955  Adrienne [name] is a 65 y.o. year old female who is a primary care patient of Charlott Rakes, MD . The community resource team was consulted for assistance with Financial Difficulties related to home modifications and fixing her home and getting a in home aid.   Care guide performed the following interventions: Follow up call placed to the patient to discuss status of referral At this time with her home valued over $200k she doesn't qualify for any program through the government for repairs on her home. I suggested a few in home aid companies but they are too expensive for her to use. .  Follow Up Plan:  No further follow up planned at this time. The patient has been provided with needed resources.  [place], Care Management Phone: [phone] Email: [email or web address]

## 2020-03-18 ENCOUNTER — Encounter: Payer: Self-pay | Admitting: Gastroenterology

## 2020-03-18 ENCOUNTER — Ambulatory Visit (INDEPENDENT_AMBULATORY_CARE_PROVIDER_SITE_OTHER): Payer: Medicare Other | Admitting: Gastroenterology

## 2020-03-18 VITALS — BP 120/80 | HR 88 | Ht 66.0 in | Wt 191.0 lb

## 2020-03-18 DIAGNOSIS — Z1211 Encounter for screening for malignant neoplasm of colon: Secondary | ICD-10-CM | POA: Diagnosis not present

## 2020-03-18 DIAGNOSIS — Z7902 Long term (current) use of antithrombotics/antiplatelets: Secondary | ICD-10-CM | POA: Diagnosis not present

## 2020-03-18 DIAGNOSIS — R6889 Other general symptoms and signs: Secondary | ICD-10-CM | POA: Diagnosis not present

## 2020-03-18 NOTE — Patient Instructions (Signed)
We will obtain records from [facility] and contact you if you are due for a colonoscopy, you will be scheduled for a Pre-op appointment if you are due  You will be contacted by our office prior to your procedure for directions on holding your Plavix.  If you do not hear from our office 1 week prior to your scheduled procedure, please call [phone] to discuss. We will go ahead and obtain clearance for your blood thinner Plavix   Due to recent changes in healthcare laws, you may see the results of your imaging and laboratory studies on MyChart before your provider has had a chance to review them.  We understand that in some cases there may be results that are confusing or concerning to you. Not all laboratory results come back in the same time frame and the provider may be waiting for multiple results in order to interpret others.  Please give [place] 48 hours in order for your provider to thoroughly review all the results before contacting the office for clarification of your results.   I appreciate the  opportunity to care for you  Thank You   Harl Bowie , MD

## 2020-03-18 NOTE — Progress Notes (Signed)
Adrienne [name]    [id number]    1955/11/12  Primary Care Physician:Newlin, Charlane Ferretti, MD  Referring Physician: Charlott Rakes, MD [place],  [place] [address]   Chief complaint:  Colon cancer screening  HPI: 65 year old very pleasant female here to discuss colorectal cancer screening.  She was remotely followed by Dr. Sharlett Iles  Last colonoscopy at [facility], patient is unclear how long ago it was and if any polyps were removed.  Report is not available during this visit  Denies any nausea, vomiting, abdominal pain, melena or bright red blood per rectum  Medical history positive for COVID-19 pneumonia, diabetes, CKD, central retinal artery occlusion and CVA, she is on chronic antiplatelet therapy with Plavix. She is legally blind.   Outpatient Encounter Medications as of 03/18/2020  Medication Sig  . acetaminophen (TYLENOL) 500 MG tablet Take 500 mg by mouth every 6 (six) hours as needed for moderate pain.  Marland Kitchen aspirin EC 81 MG tablet Take 81 mg by mouth daily.   Marland Kitchen atorvastatin (LIPITOR) 80 MG tablet Take 1 tablet (80 mg total) by mouth daily.  . benzonatate (TESSALON) 100 MG capsule Take 1 capsule (100 mg total) by mouth 2 (two) times daily as needed for cough.  . clopidogrel (PLAVIX) 75 MG tablet Take 1 tablet (75 mg total) by mouth daily.  . Continuous Blood Gluc Sensor (FREESTYLE LIBRE 14 DAY SENSOR) MISC PLACE ONE SENSOR AND USE TO CHECK BLOOD SUGAR FOR 14 DAYS THEN REMOVE AND REPLACE.  . famotidine (PEPCID) 20 MG tablet Take 1 tablet (20 mg total) by mouth 2 (two) times daily.  . fluticasone (FLONASE) 50 MCG/ACT nasal spray PLACE 2 SPRAYS INTO BOTH NOSTRILS DAILY.  Marland Kitchen guaiFENesin-dextromethorphan (ROBITUSSIN DM) 100-10 MG/5ML syrup Take 5 mLs by mouth every 6 (six) hours as needed for cough.  . insulin glargine (LANTUS SOLOSTAR) 100 UNIT/ML Solostar Pen Inject 22 Units into the skin 2 (two) times daily.  . Insulin Pen Needle 31G X 5 MM MISC 1 Act  by Does not apply route 4 (four) times daily - after meals and at bedtime.  Marland Kitchen losartan-hydrochlorothiazide (HYZAAR) 100-25 MG tablet Take 1 tablet by mouth daily.  . metFORMIN (GLUCOPHAGE) 500 MG tablet Take 2 tablets (1,000 mg total) by mouth 2 (two) times daily with a meal.  . Misc. Devices MISC Blood pressure monitor. Dx: Hypertension  . cholecalciferol (VITAMIN D3) 25 MCG (1000 UNIT) tablet Take 1 tablet (1,000 Units total) by mouth daily. (Patient not taking: Reported on 03/18/2020)  . [DISCONTINUED] clotrimazole (GYNE-LOTRIMIN) 1 % vaginal cream Place 1 Applicatorful vaginally at bedtime. (Patient not taking: No sig reported)   No facility-administered encounter medications on file as of 03/18/2020.    Allergies as of 03/18/2020 - Review Complete 03/18/2020  Allergen Reaction Noted  . Codeine Itching 10/28/2013  . Hydrocodone Itching 10/28/2013  . Oxycodone Itching 11/05/2013  . Penicillins Itching 10/28/2013  . Clindamycin/lincomycin Itching 01/03/2018  . Niacin and related Itching 09/05/2015  . Penicillin g Itching 11/04/2013  . Niacin Itching and Anxiety 11/04/2014    Past Medical History:  Diagnosis Date  . Blindness of both eyes   . Diabetes ([place])   . High cholesterol   . Hypertension     Past Surgical History:  Procedure Laterality Date  . CATARACT EXTRACTION, BILATERAL     Laser eye surgery, cataract surgery bi-lat  . CESAREAN SECTION     Twins, [place]  . EYE SURGERY Bilateral  2012   LASER, CATARACT  . TEE WITHOUT CARDIOVERSION N/A 12/31/2013   Procedure: TRANSESOPHAGEAL ECHOCARDIOGRAM (TEE);  Surgeon: Laverda Page, MD;  Location: [facility];  Service: Cardiovascular;  Laterality: N/A;  H&P in file  . TUBAL LIGATION Bilateral     Family History  Problem Relation Age of Onset  . Dementia Mother   . Diabetes Father   . Hypertension Father   . Hypertension Sister   . Diabetes Sister   . Diabetes Sister   . Heart disease Sister     Social History    Socioeconomic History  . Marital status: Divorced    Spouse name: Not on file  . Number of children: 3  . Years of education: 35  . Highest education level: Not on file  Occupational History    Employer: [employer]  Tobacco Use  . Smoking status: Former Research scientist (life sciences)  . Smokeless tobacco: Never Used  . Tobacco comment: QUIT IN 1987  Substance and Sexual Activity  . Alcohol use: No    Alcohol/week: 0.0 standard drinks    Comment: QUIT IN 1987  . Drug use: No    Frequency: 2.0 times per week    Comment: QUIT 1980  . Sexual activity: Not Currently  Other Topics Concern  . Not on file  Social History Narrative   Patient is married with 3 children.   Patient is right handed.   Patient has 12 th grade education.   Patient has not been drinking caffeine.   Social Determinants of Health   Financial Resource Strain: Low Risk   . Difficulty of Paying Living Expenses: Not very hard  Food Insecurity: No Food Insecurity  . Worried About Charity fundraiser in the Last Year: Never true  . Ran Out of Food in the Last Year: Never true  Transportation Needs: No Transportation Needs  . Lack of Transportation (Medical): No  . Lack of Transportation (Non-Medical): No  Physical Activity: Insufficiently Active  . Days of Exercise per Week: 3 days  . Minutes of Exercise per Session: 20 min  Stress: Stress Concern Present  . Feeling of Stress : To some extent  Social Connections: Moderately Integrated  . Frequency of Communication with Friends and Family: More than three times a week  . Frequency of Social Gatherings with Friends and Family: Once a week  . Attends Religious Services: 1 to 4 times per year  . Active Member of Clubs or Organizations: Yes  . Attends Archivist Meetings: 1 to 4 times per year  . Marital Status: Divorced  Human resources officer Violence: Not At Risk  . Fear of Current or Ex-Partner: No  . Emotionally Abused: No  . Physically Abused: No  . Sexually  Abused: No      Review of systems: All other review of systems negative except as mentioned in the HPI.   Physical Exam: Vitals:   03/18/20 1430  BP: 120/80  Pulse: 88   Body mass index is 30.83 kg/m. Gen:      No acute distress HEENT:  sclera anicteric Abd:      soft, non-tender; no palpable masses, no distension Ext:    No edema Neuro: alert and oriented x 3 Psych: normal mood and affect  Data Reviewed:  Reviewed labs, radiology imaging, old records and pertinent past GI work up   Assessment and Plan/Recommendations:  64 year old very pleasant female with history of hypertension, hyperlipidemia, type 2 diabetes, CKD, history of CVA, central retinal artery occlusion  on chronic antiplatelet therapy with Plavix  She is here to discuss colorectal cancer screening.  She cannot do cologaurd, will be difficult to do the test accurately due to her visual disability. She wants to proceed with colonoscopy.  Will obtain records from [facility] to determine when her last colonoscopy was and if she is due for recall colonoscopy Patient is legally blind she is unaccompanied for this visit, will need proxy to sign the consent  Will obtain clearance from prescribing provider if okay to hold Plavix for 5 days prior to the procedure and will also neurology and cardiology clearance prior to the procedure clearance   The patient was provided an opportunity to ask questions and all were answered. The patient agreed with the plan and demonstrated an understanding of the instructions.  Damaris Hippo , MD    CC: Charlott Rakes, MD

## 2020-03-20 ENCOUNTER — Other Ambulatory Visit: Payer: Medicare Other

## 2020-03-20 ENCOUNTER — Other Ambulatory Visit: Payer: Self-pay

## 2020-03-20 ENCOUNTER — Ambulatory Visit
Admission: RE | Admit: 2020-03-20 | Discharge: 2020-03-20 | Disposition: A | Discharge status: Home / Self Care | Payer: Medicare Other | Source: Ambulatory Visit | Attending: Family Medicine | Admitting: Family Medicine

## 2020-03-20 DIAGNOSIS — Z1231 Encounter for screening mammogram for malignant neoplasm of breast: Secondary | ICD-10-CM

## 2020-03-25 ENCOUNTER — Other Ambulatory Visit: Payer: Self-pay | Admitting: Family Medicine

## 2020-03-25 DIAGNOSIS — R928 Other abnormal and inconclusive findings on diagnostic imaging of breast: Secondary | ICD-10-CM

## 2020-03-31 ENCOUNTER — Telehealth: Payer: Self-pay | Admitting: *Deleted

## 2020-03-31 NOTE — Telephone Encounter (Signed)
2nd fax request today for Medical Records from [facility] Fax # [phone]  Waiting on records to see if this patient needs to be scheduled for a colonoscopy. Pt is on plavix will have to be held once we review and schedule

## 2020-04-02 ENCOUNTER — Telehealth: Payer: Self-pay | Admitting: *Deleted

## 2020-04-02 NOTE — Patient Outreach (Signed)
[facility]) Care Management  04/02/2020  Adrienne [name] 06/28/1955 [id number]   CSW spoke with pt who is continuing to seek a job. Pt reports making phone calls herself to local businesses and awaiting some calls back.  Pt also shared she is in need of a ride for mammogram next week. CSW assisted pt with calling the [facility] # to arrange.  CSW also assisting pt with getting the # saved in her phone with her friends assistance so she can independently voice call the requests in in the future.   Pt shared the support that [name], Care Guide, is offering to her in helping link her with resources.  Pt appreciative and optimistic going forward that things are going to improve and opportunities are going to come her way. CSW offered encouragement and support and will touch base again in 3 weeks.   [name], MSW, [place] Worker  [place] [phone]

## 2020-04-06 MED FILL — LOSARTAN-HCTZ 100-25 MG TAB: 100-25 | 90 days supply | Qty: 90 | Fill #1

## 2020-04-06 MED FILL — ATORVASTATIN CALCIUM 80 MG: 80 | 90 days supply | Qty: 90 | Fill #1

## 2020-04-06 MED FILL — CLOPIDOGREL 75 MG TABLET: 75 | 90 days supply | Qty: 90 | Fill #1

## 2020-04-08 ENCOUNTER — Ambulatory Visit
Admission: RE | Admit: 2020-04-08 | Discharge: 2020-04-08 | Disposition: A | Discharge status: Home / Self Care | Payer: Medicare Other | Source: Ambulatory Visit | Attending: Family Medicine | Admitting: Family Medicine

## 2020-04-08 ENCOUNTER — Other Ambulatory Visit: Payer: Self-pay

## 2020-04-08 DIAGNOSIS — R922 Inconclusive mammogram: Secondary | ICD-10-CM | POA: Diagnosis not present

## 2020-04-08 DIAGNOSIS — R928 Other abnormal and inconclusive findings on diagnostic imaging of breast: Secondary | ICD-10-CM | POA: Diagnosis not present

## 2020-04-10 ENCOUNTER — Encounter: Payer: Self-pay | Admitting: Gastroenterology

## 2020-04-10 ENCOUNTER — Telehealth: Payer: Self-pay

## 2020-04-10 NOTE — Telephone Encounter (Signed)
-----   Message from Charlott Rakes, MD sent at 04/10/2020  1:38 PM EDT ----- Please inform her that mammogram does not reveal evidence of cancer.

## 2020-04-10 NOTE — Telephone Encounter (Signed)
VM currently full can not leave a message.

## 2020-04-18 ENCOUNTER — Other Ambulatory Visit: Payer: Self-pay

## 2020-04-23 ENCOUNTER — Telehealth: Payer: Self-pay | Admitting: *Deleted

## 2020-04-23 ENCOUNTER — Ambulatory Visit: Payer: Self-pay | Admitting: *Deleted

## 2020-05-11 ENCOUNTER — Other Ambulatory Visit: Payer: Self-pay | Admitting: *Deleted

## 2020-05-11 NOTE — Patient Outreach (Signed)
Adrienne [name]) Care Management  [place]  05/11/2020   Adrienne [name] [birth year] [id number]  RN Health Coach telephone call to patient.  [employer] compliance verified. Per patient she is still unable to read her meter. Patient has not followed thru with getting the meter connected to phone. She is waiting on family and friends as they come thru to read her meter. She is taking her insulin as per ordered. Patient would like to start an exercise routine of walking and will talk with neighbor about joining her when walking. Patient would like to go to Dentist but inability to see makes hard to make appointments. Patient has agreed to follow up outreach  calls. Encounter Medications:  Outpatient Encounter Medications as of 05/11/2020  Medication Sig Note  . acetaminophen (TYLENOL) 500 MG tablet Take 500 mg by mouth every 6 (six) hours as needed for moderate pain.   Marland Kitchen aspirin EC 81 MG tablet Take 81 mg by mouth daily.    Marland Kitchen atorvastatin (LIPITOR) 80 MG tablet TAKE 1 TABLET (80 MG TOTAL) BY MOUTH DAILY.   . benzonatate (TESSALON) 100 MG capsule TAKE 1 CAPSULE (100 MG TOTAL) BY MOUTH 2 (TWO) TIMES DAILY AS NEEDED FOR COUGH.   . cholecalciferol (VITAMIN D3) 25 MCG (1000 UNIT) tablet Take 1 tablet (1,000 Units total) by mouth daily. (Patient not taking: Reported on 03/18/2020)   . clopidogrel (PLAVIX) 75 MG tablet TAKE 1 TABLET (75 MG TOTAL) BY MOUTH DAILY.   Marland Kitchen Continuous Blood Gluc Sensor (FREESTYLE LIBRE 14 DAY SENSOR) MISC PLACE ONE SENSOR AND USE TO CHECK BLOOD SUGAR FOR 14 DAYS THEN REMOVE AND REPLACE. 04/03/2019: Waiting on new sensor.  . famotidine (PEPCID) 20 MG tablet Take 1 tablet (20 mg total) by mouth 2 (two) times daily.   . fluticasone (FLONASE) 50 MCG/ACT nasal spray PLACE 2 SPRAYS INTO BOTH NOSTRILS DAILY.   Marland Kitchen guaiFENesin-dextromethorphan (ROBITUSSIN DM) 100-10 MG/5ML syrup Take 5 mLs by mouth every 6 (six) hours as needed for cough.   . insulin glargine (LANTUS) 100  UNIT/ML Solostar Pen INJECT 22 UNITS INTO THE SKIN 2 (TWO) TIMES DAILY.   Marland Kitchen Insulin Pen Needle 31G X 5 MM MISC 1 Act by Does not apply route 4 (four) times daily - after meals and at bedtime.   Marland Kitchen losartan-hydrochlorothiazide (HYZAAR) 100-25 MG tablet TAKE 1 TABLET BY MOUTH DAILY.   . metFORMIN (GLUCOPHAGE) 500 MG tablet TAKE 2 TABLETS (1,000 MG TOTAL) BY MOUTH 2 (TWO) TIMES DAILY WITH A MEAL.   . Misc. Devices MISC Blood pressure monitor. Dx: Hypertension    No facility-administered encounter medications on file as of 05/11/2020.    Functional Status:  No flowsheet data found.  Fall/Depression Screening: Fall Risk  05/11/2020 02/11/2020 01/21/2020  Falls in the past year? 0 0 0  Comment - - -  Number falls in past yr: 0 0 0  Injury with Fall? 0 0 0  Risk for fall due to : - - -  Follow up - - -   PHQ 2/9 Scores 05/11/2020 04/03/2019 01/03/2018 08/29/2016 06/03/2016 05/04/2016 09/11/2015  PHQ - 2 Score 1 1 2 3  - - 0  PHQ- 9 Score - - 6 10 - - -  Exception Documentation - - - - Patient refusal Other- indicate reason in comment box -  Not completed - - - - - patient is unable to fill out because she cannot see. -    Assessment:  Goals Addressed  This Visit's Progress   . (THN)Make and Keep All Appointments   On track    Timeframe:  Long-Range Goal Priority:  Medium Start Date: [id number]                            Expected End Date:      41660630                Follow Up Date 16010932   - ask family or friend for a ride - call to cancel if needed - keep a calendar with prescription refill dates - keep a calendar with appointment dates    Why is this important?   Part of staying healthy is seeing the doctor for follow-up care.  If you forget your appointments, there are some things you can do to stay on track.    Notes:  Patient kept appt [id number] Next appointments scheduled 0302 Gastro and 0304 Mammogram and Bone density    . (THN)Monitor and Manage My Blood Sugar    Not on track    Timeframe:  Short-Term Goal Priority:  High Start Date:   [id number]                          Expected End Date:12302022              Follow Up Date [id number]   - check blood sugar at prescribed times - check blood sugar if I feel it is too high or too low - take the blood sugar log to all doctor visits - take the blood sugar meter to all doctor visits    Why is this important?   Checking your blood sugar at home helps to keep it from getting very high or very low.  Writing the results in a diary or log helps the doctor know how to care for you.  Your blood sugar log should have the time, date and the results.  Also, write down the amount of insulin or other medicine that you take.  Other information, like what you ate, exercise done and how you were feeling, will also be helpful.     Notes:  Discussed with patient about face timing son with meter so he can tell her what her readings are. Patient is totally blind RN referred to pharmacy for any options to make free style libre to talk the blood sugar readings Patient has not gotten app on phone for verbal readings of CBG [id number] Patient is unable to read her meter    . (THN)Perform Foot Care   On track    Timeframe:  Long-Range Goal Priority:  Medium Start Date: 71062694                            Expected End Date: 85462703        Follow Up Date 50093818   - trim toenails straight across - wash and dry feet carefully every day - wear comfortable, cotton socks - wear comfortable, well-fitting shoes    Why is this important?   Good foot care is very important when you have diabetes.  There are many things you can do to keep your feet healthy and catch a problem early.    Notes:     . (THN)Set My Target A1C       Timeframe:  Short-Term Goal Priority:  High Start  Date:      94801655                       Expected End Date:        37482707           Follow Up Date 86754492   - set target A1C    Why is  this important?   Your target A1C is decided together by you and your doctor.  It is based on several things like your age and other health issues.    Notes:  Target is 7.0 but want to see a decrease from 10.3 01/25 A1C has decreased to 8.0       Plan:  Follow-up:  Patient agrees to Care Plan and Follow-up. RN assisted patient in calling Dentist for appointment RN notified Dr of patient inability to read meter and the need for programing meter to phone Patient will follow up with neighbor for exercise walking RN will follow up outreach within the month of July RN sent PCP update assessment  [place] Management [phone]

## 2020-05-11 NOTE — Patient Instructions (Signed)
Goals Addressed            This Visit's Progress   . (THN)Make and Keep All Appointments   On track    Timeframe:  Long-Range Goal Priority:  Medium Start Date: 61607371                            Expected End Date:      06269485                Follow Up Date 46270350   - ask family or friend for a ride - call to cancel if needed - keep a calendar with prescription refill dates - keep a calendar with appointment dates    Why is this important?   Part of staying healthy is seeing the doctor for follow-up care.  If you forget your appointments, there are some things you can do to stay on track.    Notes:  Patient kept appt [id number] Next appointments scheduled 0302 Gastro and 0304 Mammogram and Bone density    . (THN)Monitor and Manage My Blood Sugar   Not on track    Timeframe:  Short-Term Goal Priority:  High Start Date:   93716967                          Expected End Date:12302022              Follow Up Date 89381017   - check blood sugar at prescribed times - check blood sugar if I feel it is too high or too low - take the blood sugar log to all doctor visits - take the blood sugar meter to all doctor visits    Why is this important?   Checking your blood sugar at home helps to keep it from getting very high or very low.  Writing the results in a diary or log helps the doctor know how to care for you.  Your blood sugar log should have the time, date and the results.  Also, write down the amount of insulin or other medicine that you take.  Other information, like what you ate, exercise done and how you were feeling, will also be helpful.     Notes:  Discussed with patient about face timing son with meter so he can tell her what her readings are. Patient is totally blind RN referred to pharmacy for any options to make free style libre to talk the blood sugar readings Patient has not gotten app on phone for verbal readings of CBG [id number] Patient is unable to read her  meter    . (THN)Perform Foot Care   On track    Timeframe:  Long-Range Goal Priority:  Medium Start Date: 77824235                            Expected End Date: 36144315        Follow Up Date 40086761   - trim toenails straight across - wash and dry feet carefully every day - wear comfortable, cotton socks - wear comfortable, well-fitting shoes    Why is this important?   Good foot care is very important when you have diabetes.  There are many things you can do to keep your feet healthy and catch a problem early.    Notes:     . (THN)Set My Target A1C  Timeframe:  Short-Term Goal Priority:  High Start Date:      57846962                       Expected End Date:        95284132           Follow Up Date 44010272   - set target A1C    Why is this important?   Your target A1C is decided together by you and your doctor.  It is based on several things like your age and other health issues.    Notes:  Target is 7.0 but want to see a decrease from 10.3 01/25 A1C has decreased to 8.0

## 2020-05-15 ENCOUNTER — Other Ambulatory Visit: Payer: Self-pay

## 2020-05-15 MED FILL — Metformin HCl Tab 500 MG: ORAL | 90 days supply | Qty: 360 | Fill #0 | Status: AC

## 2020-05-21 ENCOUNTER — Other Ambulatory Visit: Payer: Self-pay

## 2020-06-09 DIAGNOSIS — E119 Type 2 diabetes mellitus without complications: Secondary | ICD-10-CM | POA: Diagnosis not present

## 2020-06-09 DIAGNOSIS — Z794 Long term (current) use of insulin: Secondary | ICD-10-CM | POA: Diagnosis not present

## 2020-06-23 ENCOUNTER — Other Ambulatory Visit: Payer: Self-pay

## 2020-06-23 MED FILL — Insulin Glargine Soln Pen-Injector 100 Unit/ML: SUBCUTANEOUS | 88 days supply | Qty: 39 | Fill #0 | Status: AC

## 2020-07-07 ENCOUNTER — Other Ambulatory Visit: Payer: Self-pay | Admitting: Family Medicine

## 2020-07-07 ENCOUNTER — Other Ambulatory Visit: Payer: Self-pay

## 2020-07-07 MED ORDER — FLUTICASONE PROPIONATE 50 MCG/ACT NA SUSP
2.0000 | Freq: Every day | NASAL | 2 refills | Status: DC
  Filled 2020-07-07: qty 16, 30d supply, fill #0
  Filled 2020-11-02: qty 16, 30d supply, fill #1
  Filled 2021-03-19 (×2): qty 16, 30d supply, fill #0

## 2020-07-07 MED FILL — Atorvastatin Calcium Tab 80 MG (Base Equivalent): ORAL | 90 days supply | Qty: 90 | Fill #0 | Status: AC

## 2020-07-07 MED FILL — Losartan Potassium & Hydrochlorothiazide Tab 100-25 MG: ORAL | 90 days supply | Qty: 90 | Fill #0 | Status: AC

## 2020-07-08 ENCOUNTER — Ambulatory Visit: Payer: Medicare Other

## 2020-07-09 ENCOUNTER — Other Ambulatory Visit: Payer: Self-pay

## 2020-07-09 MED FILL — Clopidogrel Bisulfate Tab 75 MG (Base Equiv): ORAL | 90 days supply | Qty: 90 | Fill #0 | Status: AC

## 2020-07-10 ENCOUNTER — Other Ambulatory Visit: Payer: Self-pay

## 2020-07-16 ENCOUNTER — Ambulatory Visit (INDEPENDENT_AMBULATORY_CARE_PROVIDER_SITE_OTHER): Payer: Medicare Other | Admitting: Nurse Practitioner

## 2020-07-16 VITALS — BP 144/73 | HR 86 | Temp 97.9°F | Resp 18 | Ht 66.0 in | Wt 191.0 lb

## 2020-07-16 DIAGNOSIS — Z0000 Encounter for general adult medical examination without abnormal findings: Secondary | ICD-10-CM | POA: Diagnosis not present

## 2020-07-16 NOTE — Progress Notes (Signed)
Subjective:   Adrienne [name] is a 65 y.o. female who presents for Medicare Annual (Subsequent) preventive examination.  Review of Systems    Review of Systems  Constitutional: Negative.   HENT: Negative.    Eyes: Negative.   Respiratory: Negative.    Cardiovascular: Negative.   Gastrointestinal: Negative.   Genitourinary: Negative.   Musculoskeletal: Negative.   Skin: Negative.   Neurological: Negative.   Endo/Heme/Allergies: Negative.   Psychiatric/Behavioral: Negative.     Cardiac Risk Factors include: advanced age (>24men, >44 women);obesity (BMI >30kg/m2);sedentary lifestyle     Objective:    Today's Vitals   07/16/20 1425  BP: (!) 144/73  Pulse: 86  Resp: 18  Temp: 97.9 F (36.6 C)  SpO2: 100%  Weight: 191 lb (86.6 kg)  Height: 5\' 6"  (1.676 m)   Body mass index is 30.83 kg/m.  Advanced Directives 07/16/2020 04/03/2019 02/13/2019 08/26/2018 10/17/2016 06/03/2016 05/04/2016  Does Patient Have a Medical Advance Directive? No Yes No No No No No  Type of Advance Directive - Out of facility DNR (pink MOST or yellow form) - - - - -  Would patient like information on creating a medical advance directive? No - Patient declined - No - Patient declined No - Guardian declined - - No - Patient declined    Current Medications (verified) Outpatient Encounter Medications as of 07/16/2020  Medication Sig   acetaminophen (TYLENOL) 500 MG tablet Take 500 mg by mouth every 6 (six) hours as needed for moderate pain.   aspirin EC 81 MG tablet Take 81 mg by mouth daily.    atorvastatin (LIPITOR) 80 MG tablet TAKE 1 TABLET (80 MG TOTAL) BY MOUTH DAILY.   benzonatate (TESSALON) 100 MG capsule TAKE 1 CAPSULE (100 MG TOTAL) BY MOUTH 2 (TWO) TIMES DAILY AS NEEDED FOR COUGH.   cholecalciferol (VITAMIN D3) 25 MCG (1000 UNIT) tablet Take 1 tablet (1,000 Units total) by mouth daily. (Patient not taking: Reported on 03/18/2020)   clopidogrel (PLAVIX) 75 MG tablet TAKE 1 TABLET (75 MG TOTAL) BY  MOUTH DAILY.   Continuous Blood Gluc Sensor (FREESTYLE LIBRE 14 DAY SENSOR) MISC PLACE ONE SENSOR AND USE TO CHECK BLOOD SUGAR FOR 14 DAYS THEN REMOVE AND REPLACE.   famotidine (PEPCID) 20 MG tablet Take 1 tablet (20 mg total) by mouth 2 (two) times daily.   fluticasone (FLONASE) 50 MCG/ACT nasal spray PLACE 2 SPRAYS INTO BOTH NOSTRILS DAILY.   guaiFENesin-dextromethorphan (ROBITUSSIN DM) 100-10 MG/5ML syrup Take 5 mLs by mouth every 6 (six) hours as needed for cough.   insulin glargine (LANTUS) 100 UNIT/ML Solostar Pen INJECT 22 UNITS INTO THE SKIN 2 (TWO) TIMES DAILY.   Insulin Pen Needle 31G X 5 MM MISC 1 Act by Does not apply route 4 (four) times daily - after meals and at bedtime.   losartan-hydrochlorothiazide (HYZAAR) 100-25 MG tablet TAKE 1 TABLET BY MOUTH DAILY.   metFORMIN (GLUCOPHAGE) 500 MG tablet TAKE 2 TABLETS (1,000 MG TOTAL) BY MOUTH 2 (TWO) TIMES DAILY WITH A MEAL.   Misc. Devices MISC Blood pressure monitor. Dx: Hypertension   No facility-administered encounter medications on file as of 07/16/2020.    Allergies (verified) Codeine, Hydrocodone, Oxycodone, Penicillins, Clindamycin/lincomycin, Niacin and related, Penicillin g, and Niacin   History: Past Medical History:  Diagnosis Date   Blindness of both eyes    Diabetes ([place])    High cholesterol    Hypertension    Past Surgical History:  Procedure Laterality Date   CATARACT EXTRACTION, BILATERAL  Laser eye surgery, cataract surgery bi-lat   CESAREAN SECTION     Twins, [place]   EYE SURGERY Bilateral 2012   LASER, CATARACT   TEE WITHOUT CARDIOVERSION N/A 12/31/2013   Procedure: TRANSESOPHAGEAL ECHOCARDIOGRAM (TEE);  Surgeon: Laverda Page, MD;  Location: [facility];  Service: Cardiovascular;  Laterality: N/A;  H&P in file   TUBAL LIGATION Bilateral    Family History  Problem Relation Age of Onset   Dementia Mother    Diabetes Father    Hypertension Father    Hypertension Sister    Diabetes Sister     Diabetes Sister    Heart disease Sister    Social History   Socioeconomic History   Marital status: Divorced    Spouse name: Not on file   Number of children: 3   Years of education: 12   Highest education level: Not on file  Occupational History    Employer: [employer]  Tobacco Use   Smoking status: Former    Pack years: 0.00   Smokeless tobacco: Never   Tobacco comments:    QUIT IN 1987  Substance and Sexual Activity   Alcohol use: No    Alcohol/week: 0.0 standard drinks    Comment: QUIT IN 1987   Drug use: No    Frequency: 2.0 times per week    Comment: QUIT 1980   Sexual activity: Not Currently  Other Topics Concern   Not on file  Social History Narrative   Patient is married with 3 children.   Patient is right handed.   Patient has 12 th grade education.   Patient has not been drinking caffeine.   Social Determinants of Health   Financial Resource Strain: Medium Risk   Difficulty of Paying Living Expenses: Somewhat hard  Food Insecurity: No Food Insecurity   Worried About Charity fundraiser in the Last Year: Never true   Ran Out of Food in the Last Year: Never true  Transportation Needs: No Transportation Needs   Lack of Transportation (Medical): No   Lack of Transportation (Non-Medical): No  Physical Activity: Inactive   Days of Exercise per Week: 0 days   Minutes of Exercise per Session: 0 min  Stress: No Stress Concern Present   Feeling of Stress : Not at all  Social Connections: Moderately Integrated   Frequency of Communication with Friends and Family: More than three times a week   Frequency of Social Gatherings with Friends and Family: Twice a week   Attends Religious Services: More than 4 times per year   Active Member of [employer] or Organizations: Yes   Attends Music therapist: More than 4 times per year   Marital Status: Divorced    Tobacco Counseling Counseling given: Not Answered Tobacco comments: QUIT IN  1987   Clinical Intake:  Pre-visit preparation completed: No  Pain : No/denies pain     BMI - recorded: 30.83 Nutritional Status: BMI > 30  Obese Nutritional Risks: None Diabetes: Yes CBG done?: No Did pt. bring in CBG monitor from home?: No  How often do you need to have someone help you when you read instructions, pamphlets, or other written materials from your doctor or pharmacy?: 5 - Always What is the last grade level you completed in school?: 1 year college  Diabetic? yes  Interpreter Needed?: No      Activities of Daily Living In your present state of health, do you have any difficulty performing the following activities: 07/16/2020  Hearing? N  Vision? 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 N  Walking or climbing stairs? Y  Dressing or bathing? Y  Doing errands, shopping? N  Preparing Food and eating ? Y  Using the Toilet? Y  In the past six months, have you accidently leaked urine? N  Do you have problems with loss of bowel control? N  Managing your Medications? Y  Managing your Finances? Y  Housekeeping or managing your Housekeeping? Y  Some recent data might be hidden    Patient Care Team: Charlott Rakes, MD as PCP - General (Family Medicine) Deirdre Peer, LCSW as [facility] [place], Eppie Gibson, RN as [place] any recent [place] you may have received from other than [facility] providers in the past year (date may be approximate).     Assessment:   This is a routine wellness examination for Jazzlene.  Hearing/Vision screen No results found.  Dietary issues and exercise activities discussed: Current Exercise Habits: The patient does not participate in regular exercise at present, Exercise limited by: None identified   Goals Addressed   None    Depression Screen PHQ 2/9 Scores 07/16/2020 05/11/2020 04/03/2019 01/03/2018 08/29/2016  06/03/2016 05/04/2016  PHQ - 2 Score 1 1 1 2 3  - -  PHQ- 9 Score - - - 6 10 - -  Exception Documentation - - - - - Patient refusal Other- indicate reason in comment box  Not completed - - - - - - patient is unable to fill out because she cannot see.    Fall Risk Fall Risk  07/16/2020 05/11/2020 02/11/2020 01/21/2020 01/08/2020  Falls in the past year? 0 0 0 0 0  Comment - - - - -  Number falls in past yr: 0 0 0 0 -  Injury with Fall? 0 0 0 0 -  Risk for fall due to : Impaired vision - - - -  Follow up Education provided - - - -    FALL RISK PREVENTION PERTAINING TO THE HOME:  Any stairs in or around the home? Yes  If so, are there any without handrails? No  Home free of loose throw rugs in walkways, pet beds, electrical cords, etc? Yes  Adequate lighting in your home to reduce risk of falls? Yes   ASSISTIVE DEVICES UTILIZED TO PREVENT FALLS:  Life alert? No  Use of a cane, walker or w/c? Yes  Grab bars in the bathroom? No  Shower chair or bench in shower? No  Elevated toilet seat or a handicapped toilet? No   TIMED UP AND GO:  Was the test performed? Yes .  Length of time to ambulate 10 feet: 3 sec.   Gait steady and fast with assistive device  Cognitive Function: MMSE - Mini Mental State Exam 07/16/2020  Orientation to time 5  Orientation to Place 5  Registration 3  Attention/ Calculation 5  Recall 3  Language- name 2 objects 2  Language- repeat 1  Language- follow 3 step command 3  Language- read & follow direction 1  Write a sentence 0  Copy design 0  Total score 28        Immunizations Immunization History  Administered Date(s) Administered   Influenza,inj,Quad PF,6+ Mos 10/05/2015, 10/18/2016, 10/04/2017, 10/21/2019   Influenza-Unspecified 11/02/2013, 11/14/2014   PFIZER(Purple Top)SARS-COV-2 Vaccination 05/20/2019, 06/10/2019, 01/04/2020   Pneumococcal Conjugate-13 10/21/2019   Pneumococcal Polysaccharide-23 11/04/2014   Pneumococcal-Unspecified  11/14/2014  Tdap 12/15/2015    TDAP status: Up to date  Flu Vaccine status: Up to date  Pneumococcal vaccine status: Up to date  Covid-19 vaccine status: Completed vaccines  Qualifies for Shingles Vaccine? No   Zostavax completed No   Shingrix Completed?: No.    Education has been provided regarding the importance of this vaccine. Patient has been advised to call insurance company to determine out of pocket expense if they have not yet received this vaccine. Advised may also receive vaccine at local pharmacy or Health Dept. Verbalized acceptance and understanding.  Screening Tests Health Maintenance  Topic Date Due   Zoster Vaccines- Shingrix (1 of 2) Never done   OPHTHALMOLOGY EXAM  10/24/2018   COLONOSCOPY (Pts 45-61yrs Insurance coverage will need to be confirmed)  01/17/2019   DEXA SCAN  Never done   COVID-19 Vaccine (4 - Booster for Pfizer series) 04/03/2020   HEMOGLOBIN A1C  07/20/2020   INFLUENZA VACCINE  08/17/2020   FOOT EXAM  10/20/2020   PNA vac Low Risk Adult (2 of 2 - PPSV23) 10/20/2020   PAP SMEAR-Modifier  12/14/2020   MAMMOGRAM  04/09/2022   TETANUS/TDAP  12/14/2025   Hepatitis C Screening  Completed   HIV Screening  Completed   HPV VACCINES  Aged Out    Health Maintenance  Health Maintenance Due  Topic Date Due   Zoster Vaccines- Shingrix (1 of 2) Never done   OPHTHALMOLOGY EXAM  10/24/2018   COLONOSCOPY (Pts 45-28yrs Insurance coverage will need to be confirmed)  01/17/2019   DEXA SCAN  Never done   COVID-19 Vaccine (4 - Booster for [place] series) 04/03/2020    Colorectal cancer screening: Type of screening: Colonoscopy. Completed  . Repeat every 10 years  Mammogram status: Completed  . Repeat every year  Bone Density status: Ordered  . Pt provided with contact info and advised to call to schedule appt.  Lung Cancer Screening: (Low Dose CT Chest recommended if Age 64-80 years, 30 pack-year currently smoking OR have quit w/in 15years.) does not  qualify.   Lung Cancer Screening Referral: NA  Additional Screening:  Hepatitis C Screening: does qualify; Completed   Vision Screening: Recommended annual ophthalmology exams for early detection of glaucoma and other disorders of the eye. Is the patient up to date with their annual eye exam?   NA Who is the provider or what is the name of the office in which the patient attends annual eye exams? NA If pt is not established with a provider, would they like to be referred to a provider to establish care?  NA .   Dental Screening: Recommended annual dental exams for proper oral hygiene  Community Resource Referral / Chronic Care Management: CRR required this visit?  No   CCM required this visit?  No      Plan:     I have personally reviewed and noted the following in the patient's chart:   Medical and social history Use of alcohol, tobacco or illicit drugs  Current medications and supplements including opioid prescriptions.  Functional ability and status Nutritional status Physical activity Advanced directives List of other physicians Hospitalizations, surgeries, and ER visits in previous 12 months Vitals Screenings to include cognitive, depression, and falls Referrals and appointments  In addition, I have reviewed and discussed with patient certain preventive protocols, quality metrics, and best practice recommendations. A written personalized care plan for preventive services as well as general preventive health recommendations were provided to patient.  Fenton Foy, NP   07/16/2020

## 2020-07-16 NOTE — Patient Instructions (Signed)
Adrienne [name] , Thank you for taking time to come for your Medicare Wellness Visit. I appreciate your ongoing commitment to your health goals. Please review the following plan we discussed and let me know if I can assist you in the future.   These are the goals we discussed:    This is a list of the screening recommended for you and due dates:  Health Maintenance  Topic Date Due   Zoster (Shingles) Vaccine (1 of 2) Never done   Eye exam for diabetics  10/24/2018   Colon Cancer Screening  01/17/2019   DEXA scan (bone density measurement)  Never done   COVID-19 Vaccine (4 - Booster for Pfizer series) 04/03/2020   Hemoglobin A1C  07/20/2020   Flu Shot  08/17/2020   Complete foot exam   10/20/2020   Pneumonia vaccines (2 of 2 - PPSV23) 10/20/2020   Pap Smear  12/14/2020   Mammogram  04/09/2022   Tetanus Vaccine  12/14/2025   Hepatitis C Screening: USPSTF Recommendation to screen - Ages 18-79 yo.  Completed   HIV Screening  Completed   HPV Vaccine  Aged Out    Health Maintenance, Female Adopting a healthy lifestyle and getting preventive care are important in promoting health and wellness. Ask your health care provider about: The right schedule for you to have regular tests and exams. Things you can do on your own to prevent diseases and keep yourself healthy. What should I know about diet, weight, and exercise? Eat a healthy diet  Eat a diet that includes plenty of vegetables, fruits, low-fat dairy products, and lean protein. Do not eat a lot of foods that are high in solid fats, added sugars, or sodium.  Maintain a healthy weight Body mass index (BMI) is used to identify weight problems. It estimates body fat based on height and weight. Your health care provider can help determineyour BMI and help you achieve or maintain a healthy weight. Get regular exercise Get regular exercise. This is one of the most important things you can do for your health. Most adults should: Exercise  for at least 150 minutes each week. The exercise should increase your heart rate and make you sweat (moderate-intensity exercise). Do strengthening exercises at least twice a week. This is in addition to the moderate-intensity exercise. Spend less time sitting. Even light physical activity can be beneficial. Watch cholesterol and blood lipids Have your blood tested for lipids and cholesterol at 66 years of age, then havethis test every 5 years. Have your cholesterol levels checked more often if: Your lipid or cholesterol levels are high. You are older than 66 years of age. You are at high risk for heart disease. What should I know about cancer screening? Depending on your health history and family history, you may need to have cancer screening at various ages. This may include screening for: Breast cancer. Cervical cancer. Colorectal cancer. Skin cancer. Lung cancer. What should I know about heart disease, diabetes, and high blood pressure? Blood pressure and heart disease High blood pressure causes heart disease and increases the risk of stroke. This is more likely to develop in people who have high blood pressure readings, are of African descent, or are overweight. Have your blood pressure checked: Every 3-5 years if you are 62-43 years of age. Every year if you are 81 years old or older. Diabetes Have regular diabetes screenings. This checks your fasting blood sugar level. Have the screening done: Once every three years after age 57 if  you are at a normal weight and have a low risk for diabetes. More often and at a younger age if you are overweight or have a high risk for diabetes. What should I know about preventing infection? Hepatitis B If you have a higher risk for hepatitis B, you should be screened for this virus. Talk with your health care provider to find out if you are at risk forhepatitis B infection. Hepatitis C Testing is recommended for: Everyone born from [birth year] through  1965. Anyone with known risk factors for hepatitis C. Sexually transmitted infections (STIs) Get screened for STIs, including gonorrhea and chlamydia, if: You are sexually active and are younger than 66 years of age. You are older than 66 years of age and your health care provider tells you that you are at risk for this type of infection. Your sexual activity has changed since you were last screened, and you are at increased risk for chlamydia or gonorrhea. Ask your health care provider if you are at risk. Ask your health care provider about whether you are at high risk for HIV. Your health care provider may recommend a prescription medicine to help prevent HIV infection. If you choose to take medicine to prevent HIV, you should first get tested for HIV. You should then be tested every 3 months for as long as you are taking the medicine. Pregnancy If you are about to stop having your period (premenopausal) and you may become pregnant, seek counseling before you get pregnant. Take 400 to 800 micrograms (mcg) of folic acid every day if you become pregnant. Ask for birth control (contraception) if you want to prevent pregnancy. Osteoporosis and menopause Osteoporosis is a disease in which the bones lose minerals and strength with aging. This can result in bone fractures. If you are 66 years old or older, or if you are at risk for osteoporosis and fractures, ask your health care provider if you should: Be screened for bone loss. Take a calcium or vitamin D supplement to lower your risk of fractures. Be given hormone replacement therapy (HRT) to treat symptoms of menopause. Follow these instructions at home: Lifestyle Do not use any products that contain nicotine or tobacco, such as cigarettes, e-cigarettes, and chewing tobacco. If you need help quitting, ask your health care provider. Do not use street drugs. Do not share needles. Ask your health care provider for help if you need support or  information about quitting drugs. Alcohol use Do not drink alcohol if: Your health care provider tells you not to drink. You are pregnant, may be pregnant, or are planning to become pregnant. If you drink alcohol: Limit how much you use to 0-1 drink a day. Limit intake if you are breastfeeding. Be aware of how much alcohol is in your drink. In the [place] one drink equals one 12 oz bottle of beer (355 mL), one 5 oz glass of wine (148 mL), or one 1 oz glass of hard liquor (44 mL). General instructions Schedule regular health, dental, and eye exams. Stay current with your vaccines. Tell your health care provider if: You often feel depressed. You have ever been abused or do not feel safe at home. Summary Adopting a healthy lifestyle and getting preventive care are important in promoting health and wellness. Follow your health care provider's instructions about healthy diet, exercising, and getting tested or screened for diseases. Follow your health care provider's instructions on monitoring your cholesterol and blood pressure. This information is not intended to replace advice  given to you by your health care provider. Make sure you discuss any questions you have with your healthcare provider. Document Revised: 12/27/2017 Document Reviewed: 12/27/2017 Elsevier Patient Education  2022 [employer].

## 2020-08-07 ENCOUNTER — Other Ambulatory Visit: Payer: Self-pay | Admitting: *Deleted

## 2020-08-07 NOTE — Patient Outreach (Signed)
[name]) Care Management  08/07/2020  Adrienne [name] [birth year] [id number]  RN Health Coach received telephone call from patient.  [employer] compliance verified. Patient was requesting COVID vaccine 2nd booster.  Plan: RN called [name] to schedule an appointment for homebound [name] [facility] [phone]

## 2020-08-11 ENCOUNTER — Other Ambulatory Visit: Payer: Self-pay | Admitting: *Deleted

## 2020-08-12 ENCOUNTER — Other Ambulatory Visit: Payer: Self-pay

## 2020-08-12 NOTE — Patient Instructions (Addendum)
Goals Addressed             This Visit's Progress    (THN)Make and Keep All Appointments   Not on track    Timeframe:  Long-Range Goal Priority:  Medium Start Date: [id number]                            Expected End Date:      [id number]                Follow Up Date [id number]   - ask family or friend for a ride - call to cancel if needed - keep a calendar with prescription refill dates - keep a calendar with appointment dates    Why is this important?   Part of staying healthy is seeing the doctor for follow-up care.  If you forget your appointments, there are some things you can do to stay on track.    Notes:  Patient kept appt [id number] Next appointments scheduled 0302 Gastro and 0304 Mammogram and Bone density [id number] Patient was unable to keep dental appointment. Patient will reschedule.     (THN)Monitor and Manage My Blood Sugar   On track    Timeframe:  Short-Term Goal Priority:  High Start Date:   [id number]                          Expected End Date:[id number]              Follow Up Date [id number]   - check blood sugar at prescribed times - check blood sugar if I feel it is too high or too low - take the blood sugar log to all doctor visits - take the blood sugar meter to all doctor visits    Why is this important?   Checking your blood sugar at home helps to keep it from getting very high or very low.  Writing the results in a diary or log helps the doctor know how to care for you.  Your blood sugar log should have the time, date and the results.  Also, write down the amount of insulin or other medicine that you take.  Other information, like what you ate, exercise done and how you were feeling, will also be helpful.     Notes:  Discussed with patient about face timing son with meter so he can tell her what her readings are. Patient is totally blind RN referred to pharmacy for any options to make free style libre to talk the blood sugar readings Patient has not  gotten app on phone for verbal readings of CBG [id number] Patient is unable to read her meter [id number] Patient has free style Libre programmed to phone     [facility]   On track    Timeframe:  Long-Range Goal Priority:  Medium Start Date: [id number]                            Expected End Date: [id number]        Follow Up [id number]   - trim toenails straight across - wash and dry feet carefully every day - wear comfortable, cotton socks - wear comfortable, well-fitting shoes    Why is this important?   Good foot care is very important when you have diabetes.  There are many things you can do to keep your feet  healthy and catch a problem early.    Notes:  [id number] Patient feels feet for injuries.      (THN)Set My Target A1C   On track    Timeframe:  Short-Term Goal Priority:  High Start Date:      [id number]                       Expected End Date:        [id number]           Follow Up Date [id number]   - set target A1C    Why is this important?   Your target A1C is decided together by you and your doctor.  It is based on several things like your age and other health issues.    Notes:  Target is 7.0 but want to see a decrease from 10.3 01/25 A1C has decreased to 8.0 [id number] A1C is 8.0

## 2020-08-12 NOTE — Patient Outreach (Signed)
[facility]) Care Management  [place]  08/11/2020   Adrienne [name] 12-10-1955 [id number]  RN Health Coach telephone call to patient.  [employer] compliance verified. Per patient he was just waking up and had not checked her blood sugars. Per patient she has her nights and day changed. So she usually goes to bed at 5 am. She stated she is doing well with her meter. RN had contacted [employer] for [employer] booster. Per patient Teressa Senter called and the date is set for August 4th. Per patient she did not get to make her dental appointment because she was not feeling well and will reschedule. Patient has not started her exercise routine. She has agreed to follow up outreach calls.    Encounter Medications:  Outpatient Encounter Medications as of 08/11/2020  Medication Sig Note   acetaminophen (TYLENOL) 500 MG tablet Take 500 mg by mouth every 6 (six) hours as needed for moderate pain.    aspirin EC 81 MG tablet Take 81 mg by mouth daily.     atorvastatin (LIPITOR) 80 MG tablet TAKE 1 TABLET (80 MG TOTAL) BY MOUTH DAILY.    benzonatate (TESSALON) 100 MG capsule TAKE 1 CAPSULE (100 MG TOTAL) BY MOUTH 2 (TWO) TIMES DAILY AS NEEDED FOR COUGH.    cholecalciferol (VITAMIN D3) 25 MCG (1000 UNIT) tablet Take 1 tablet (1,000 Units total) by mouth daily. (Patient not taking: Reported on 03/18/2020)    clopidogrel (PLAVIX) 75 MG tablet TAKE 1 TABLET (75 MG TOTAL) BY MOUTH DAILY.    Continuous Blood Gluc Sensor (FREESTYLE LIBRE 14 DAY SENSOR) MISC PLACE ONE SENSOR AND USE TO CHECK BLOOD SUGAR FOR 14 DAYS THEN REMOVE AND REPLACE. 04/03/2019: Waiting on new sensor.   famotidine (PEPCID) 20 MG tablet Take 1 tablet (20 mg total) by mouth 2 (two) times daily.    fluticasone (FLONASE) 50 MCG/ACT nasal spray PLACE 2 SPRAYS INTO BOTH NOSTRILS DAILY.    guaiFENesin-dextromethorphan (ROBITUSSIN DM) 100-10 MG/5ML syrup Take 5 mLs by mouth every 6 (six) hours as needed for cough.    insulin  glargine (LANTUS) 100 UNIT/ML Solostar Pen INJECT 22 UNITS INTO THE SKIN 2 (TWO) TIMES DAILY.    Insulin Pen Needle 31G X 5 MM MISC 1 Act by Does not apply route 4 (four) times daily - after meals and at bedtime.    losartan-hydrochlorothiazide (HYZAAR) 100-25 MG tablet TAKE 1 TABLET BY MOUTH DAILY.    metFORMIN (GLUCOPHAGE) 500 MG tablet TAKE 2 TABLETS (1,000 MG TOTAL) BY MOUTH 2 (TWO) TIMES DAILY WITH A MEAL.    Misc. Devices MISC Blood pressure monitor. Dx: Hypertension    No facility-administered encounter medications on file as of 08/11/2020.    Functional Status:  In your present state of health, do you have any difficulty performing the following activities: 07/16/2020  Hearing? N  Vision? 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 N  Walking or climbing stairs? Y  Dressing or bathing? Y  Doing errands, shopping? N  Preparing Food and eating ? Y  Using the Toilet? Y  In the past six months, have you accidently leaked urine? N  Do you have problems with loss of bowel control? N  Managing your Medications? Y  Managing your Finances? Y  Housekeeping or managing your Housekeeping? Y  Some recent data might be hidden    Fall/Depression Screening: Fall Risk  08/11/2020 07/16/2020 05/11/2020  Falls in the past year? 0 0 0  Comment - - -  Number falls in past yr: 0 0 0  Injury with Fall? 0 0 0  Risk for fall due to : - Impaired vision -  Follow up - Education provided -   [facility] 2/9 Scores 07/16/2020 05/11/2020 04/03/2019 01/03/2018 08/29/2016 06/03/2016 05/04/2016  PHQ - 2 Score '1 1 1 2 3 '$ - -  PHQ- 9 Score - - - 6 10 - -  Exception Documentation - - - - - Patient refusal Other- indicate reason in comment box  Not completed - - - - - - patient is unable to fill out because she cannot see.    Assessment:   Care Plan Care Plan : Diabetes Type 2 (Adult)  Updates made by [name], RN since 08/12/2020 12:00 AM     Problem: Glycemic Management  (Diabetes, Type 2)   Priority: High  Onset Date: 12/05/2019     Long-Range Goal: Glycemic Management Optimized   Start Date: 12/05/2019  Expected End Date: 01/15/2021  This Visit's Progress: On track  Recent Progress: On track  Priority: High  Note:   Evidence-based guidance:  Anticipate A1C testing (point-of-care) every 3 to 6 months based on goal attainment.  Review mutually-set A1C goal or target range.  Anticipate use of antihyperglycemic with or without insulin and periodic adjustments; consider active involvement of pharmacist.  Provide medical nutrition therapy and development of individualized eating.  Compare self-reported symptoms of hypo or hyperglycemia to blood glucose levels, diet and fluid intake, current medications, psychosocial and physiologic stressors, change in activity and barriers to care adherence.  Promote self-monitoring of blood glucose levels.  Assess and address barriers to management plan, such as food insecurity, age, developmental ability, depression, anxiety, fear of hypoglycemia or weight gain, as well as medication cost, side effects and complicated regimen.  Consider referral to community-based diabetes education program, visiting nurse, community health worker or health coach.  Encourage regular dental care for treatment of periodontal disease; refer to dental provider when needed.   Notes:       Goals Addressed             This Visit's Progress    (THN)Make and Keep All Appointments   Not on track    Timeframe:  Long-Range Goal Priority:  Medium Start Date: [id number]                            Expected End Date:      [id number]                Follow Up Date [id number]   - ask family or friend for a ride - call to cancel if needed - keep a calendar with prescription refill dates - keep a calendar with appointment dates    Why is this important?   Part of staying healthy is seeing the doctor for follow-up care.  If you forget your  appointments, there are some things you can do to stay on track.    Notes:  Patient kept appt [id number] Next appointments scheduled 0302 Gastro and 0304 Mammogram and Bone density [id number] Patient was unable to keep dental appointment. Patient will reschedule.     (THN)Monitor and Manage My Blood Sugar   On track    Timeframe:  Short-Term Goal Priority:  High Start Date:   [id number]  Expected End [id number]              Follow Up Date [id number]   - check blood sugar at prescribed times - check blood sugar if I feel it is too high or too low - take the blood sugar log to all doctor visits - take the blood sugar meter to all doctor visits    Why is this important?   Checking your blood sugar at home helps to keep it from getting very high or very low.  Writing the results in a diary or log helps the doctor know how to care for you.  Your blood sugar log should have the time, date and the results.  Also, write down the amount of insulin or other medicine that you take.  Other information, like what you ate, exercise done and how you were feeling, will also be helpful.     Notes:  Discussed with patient about face timing son with meter so he can tell her what her readings are. Patient is totally blind RN referred to pharmacy for any options to make free style libre to talk the blood sugar readings Patient has not gotten app on phone for verbal readings of CBG [id number] Patient is unable to read her meter [id number] Patient has free style Libre programmed to phone     [facility]   On track    Timeframe:  Long-Range Goal Priority:  Medium Start Date: [id number]                            Expected End Date: [id number]        Follow Up [id number]   - trim toenails straight across - wash and dry feet carefully every day - wear comfortable, cotton socks - wear comfortable, well-fitting shoes    Why is this important?   Good foot care is very  important when you have diabetes.  There are many things you can do to keep your feet healthy and catch a problem early.    Notes:  [id number] Patient feels feet for injuries.      (THN)Set My Target A1C   On track    Timeframe:  Short-Term Goal Priority:  High Start Date:      [id number]                       Expected End Date:        [id number]           Follow Up Date [id number]   - set target A1C    Why is this important?   Your target A1C is decided together by you and your doctor.  It is based on several things like your age and other health issues.    Notes:  Target is 7.0 but want to see a decrease from 10.3 01/25 A1C has decreased to 8.0 [id number] A1C is 8.0          Plan:  Follow-up: Patient agrees to Care Plan and Follow-up. RN will follow up within the month of October Patient will continue to monitor her blood sugar as per ordered RN sent update assessment to PCP  [facility] [phone]

## 2020-08-13 ENCOUNTER — Other Ambulatory Visit: Payer: Self-pay | Admitting: *Deleted

## 2020-08-13 DIAGNOSIS — H543 Unqualified visual loss, both eyes: Secondary | ICD-10-CM

## 2020-08-13 NOTE — Patient Outreach (Signed)
[facility] [facility]) Care Management  08/13/2020  [place] [birth year] [id number]   CSW spoke with pt who reports things are going well. 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 She is still seeking employment and has not heard anything more from the agency that was helping her with seeking training and work (for visually impaired/blind).  Pt inquiring about "someone to come over and read me my mail".  CSW will consult Care Guide for assistance with this- CSW mentioned to pt there are several Senior program/agencies which may offer a volunteer to assist.   CSW will also plan for outreach to the [employer] and follow up with pt with updates/details.   CSW offered support and reminded pt to reach out if needs arise before CSW follow up call.   [name], MSW, [place] Worker  [place] [phone]

## 2020-08-14 ENCOUNTER — Other Ambulatory Visit: Payer: Self-pay

## 2020-08-14 ENCOUNTER — Telehealth: Payer: Self-pay | Admitting: *Deleted

## 2020-08-14 ENCOUNTER — Other Ambulatory Visit: Payer: Self-pay | Admitting: Family Medicine

## 2020-08-14 DIAGNOSIS — Z794 Long term (current) use of insulin: Secondary | ICD-10-CM

## 2020-08-14 DIAGNOSIS — E1169 Type 2 diabetes mellitus with other specified complication: Secondary | ICD-10-CM

## 2020-08-14 MED ORDER — METFORMIN HCL 500 MG PO TABS
500.0000 mg | ORAL_TABLET | Freq: Two times a day (BID) | ORAL | 1 refills | Status: DC
  Filled 2020-08-14: qty 180, 90d supply, fill #0

## 2020-08-14 NOTE — Telephone Encounter (Signed)
Patient's last eGFR in January was 35. Orders are for metformin 1000 mg BID. Based on pt's eGFR, metformin dose should be adjusted. Will route to PCP.

## 2020-08-14 NOTE — Telephone Encounter (Signed)
Notes to clinic:  Patient is scheduled for appt on 10/29/2020 90 day supply    Requested Prescriptions  Pending Prescriptions Disp Refills   metFORMIN (GLUCOPHAGE) 500 MG tablet 360 tablet 1    Sig: TAKE 2 TABLETS (1,000 MG TOTAL) BY MOUTH 2 (TWO) TIMES DAILY WITH A MEAL.      Endocrinology:  Diabetes - Biguanides Failed - 08/14/2020  9:42 AM      Failed - Cr in normal range and within 360 days    Creat  Date Value Ref Range Status  01/04/2016 0.85 0.50 - 0.99 mg/dL Final    Comment:      For patients > or = 66 years of age: The upper reference limit for Creatinine is approximately 13% higher for people identified as African-American.      Creatinine, Ser  Date Value Ref Range Status  01/21/2020 1.74 (H) 0.57 - 1.00 mg/dL Final   Creatinine, Urine  Date Value Ref Range Status  01/04/2016 255 20 - 320 mg/dL Final          Failed - HBA1C is between 0 and 7.9 and within 180 days    Hemoglobin A1C  Date Value Ref Range Status  01/21/2020 8.0 (A) 4.0 - 5.6 % Final   HbA1c, POC (controlled diabetic range)  Date Value Ref Range Status  10/21/2019 10.3 (A) 0.0 - 7.0 % Final          Failed - AA eGFR in normal range and within 360 days    GFR, Est African American  Date Value Ref Range Status  01/04/2016 86 >=60 mL/min Final   GFR calc Af Amer  Date Value Ref Range Status  01/21/2020 35 (L) >59 mL/min/1.73 Final    Comment:    **In accordance with recommendations from the NKF-ASN Task force,**   [facility] is in the process of updating its eGFR calculation to the   2021 CKD-EPI creatinine equation that estimates kidney function   without a race variable.    GFR, Est Non African American  Date Value Ref Range Status  01/04/2016 74 >=60 mL/min Final   GFR calc non Af Amer  Date Value Ref Range Status  01/21/2020 30 (L) >59 mL/min/1.73 Final          Failed - Valid encounter within last 6 months    Recent Outpatient Visits           6 months ago Type 2  diabetes mellitus with other specified complication, with long-term current use of insulin ([place])   [place], [place], MD   9 months ago Need for influenza vaccination   [place], Annie Main L, RPH-CPP   9 months ago Type 2 diabetes mellitus with other specified complication, with long-term current use of insulin ([place])   [place], Charlane Ferretti, MD   1 year ago 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

## 2020-08-14 NOTE — Telephone Encounter (Signed)
   Telephone encounter was:  Unsuccessful.  08/14/2020 Name: Adrienne [name] MRN: [id number] DOB: 29-Aug-1955  Unsuccessful outbound call made today to assist with:   reading her mail   Outreach Attempt:  1st Attempt  A HIPAA compliant voice message was left requesting a return call.  Instructed patient to call back at   Instructed patient to call back at [phone]  at their earliest convenience. .  [place], Care Management  [phone] [address] , [place] [address] Email : [email or web address]

## 2020-08-17 ENCOUNTER — Other Ambulatory Visit: Payer: Self-pay

## 2020-08-17 ENCOUNTER — Telehealth: Payer: Self-pay | Admitting: *Deleted

## 2020-08-17 NOTE — Telephone Encounter (Signed)
   Telephone encounter was:  Successful.  08/17/2020 Name: Adrienne [name] MRN: [id number] DOB: 13-May-1955  Adrienne [name] is a 65 y.o. year old female who is a primary care patient of Charlott Rakes, MD . The community resource team was consulted for assistance with Talked with patient about limited reading volunteers will look for anything else later , going to get aging gracefully to call her back   Care guide performed the following interventions: Patient provided with information about care guide support team and interviewed to confirm resource needs Follow up call placed to community resources to determine status of patients referral.  Follow Up Plan:  No further follow up planned at this time. The patient has been provided with needed resources.  [place], Care Management  [phone] [address] , [place] [address] Email : [email or web address]

## 2020-08-27 ENCOUNTER — Other Ambulatory Visit: Payer: Medicare Other

## 2020-09-11 DIAGNOSIS — E119 Type 2 diabetes mellitus without complications: Secondary | ICD-10-CM | POA: Diagnosis not present

## 2020-09-11 DIAGNOSIS — Z794 Long term (current) use of insulin: Secondary | ICD-10-CM | POA: Diagnosis not present

## 2020-09-14 ENCOUNTER — Other Ambulatory Visit: Payer: Self-pay | Admitting: *Deleted

## 2020-09-14 NOTE — Patient Outreach (Signed)
[facility]) Care Management  09/14/2020  Adrienne [name] 12/11/1955 [id number]  CSW spoke with pt today who reports she is still seeking employment.  CSW reminded her of some suggestions previously made as well as the Care Guide referral-  Pt denies any issues and is encouraged to talk amongst her support groups/friends also. CSW will plan follow up in the next 3 weeks.   [name], MSW, [place] Worker  [place] [phone]

## 2020-09-22 ENCOUNTER — Other Ambulatory Visit: Payer: Self-pay

## 2020-09-22 MED FILL — Insulin Glargine Soln Pen-Injector 100 Unit/ML: SUBCUTANEOUS | 88 days supply | Qty: 39 | Fill #1 | Status: AC

## 2020-09-28 ENCOUNTER — Other Ambulatory Visit: Payer: Self-pay

## 2020-09-30 ENCOUNTER — Telehealth: Payer: Self-pay | Admitting: Family Medicine

## 2020-09-30 DIAGNOSIS — E1169 Type 2 diabetes mellitus with other specified complication: Secondary | ICD-10-CM

## 2020-09-30 DIAGNOSIS — Z794 Long term (current) use of insulin: Secondary | ICD-10-CM

## 2020-09-30 MED ORDER — METFORMIN HCL 500 MG PO TABS
500.0000 mg | ORAL_TABLET | Freq: Two times a day (BID) | ORAL | 1 refills | Status: DC
Start: 2020-09-30 — End: 2021-01-01
  Filled 2020-09-30 – 2020-10-01 (×2): qty 180, 90d supply, fill #0

## 2020-09-30 NOTE — Telephone Encounter (Signed)
Copied from [place] [id number]. Topic: General - Other >> Sep 28, 2020 11:06 AM Adrienne [name] A wrote: Reason for CRM: the patient would like to be contacted regarding their medications  The patient believes they've taken more medication than they were directed and would like to be contacted by a member of clinical staff to discuss this further   The patient has additional concerns related to their metformin   Please contact further when available

## 2020-09-30 NOTE — Telephone Encounter (Signed)
Pt was called and she states that she has been taking 4 metformin pills a day she is only to be taking 2 a day. Patient will need a new script due to her running out of medication.

## 2020-09-30 NOTE — Telephone Encounter (Signed)
Done

## 2020-10-01 ENCOUNTER — Other Ambulatory Visit: Payer: Self-pay

## 2020-10-01 MED FILL — Atorvastatin Calcium Tab 80 MG (Base Equivalent): ORAL | 90 days supply | Qty: 90 | Fill #1 | Status: AC

## 2020-10-02 ENCOUNTER — Other Ambulatory Visit: Payer: Self-pay

## 2020-10-13 ENCOUNTER — Other Ambulatory Visit: Payer: Self-pay | Admitting: *Deleted

## 2020-10-13 ENCOUNTER — Telehealth: Payer: Self-pay | Admitting: *Deleted

## 2020-10-13 DIAGNOSIS — E1110 Type 2 diabetes mellitus with ketoacidosis without coma: Secondary | ICD-10-CM

## 2020-10-13 NOTE — Telephone Encounter (Signed)
   Telephone encounter was:  Successful.  10/13/2020 Name: Adrienne [name] MRN: [id number] DOB: 06-11-1955  Adrienne [name] is a 65 y.o. year old female who is a primary care patient of Charlott Rakes, MD . The community resource team was consulted for assistance with Transportation Needs   Care guide performed the following interventions: Patient provided with information about care guide support team and interviewed to confirm resource needs Follow up call placed to community resources to determine status of patients referral.  Follow Up Plan:  No further follow up planned at this time. The patient has been provided with needed resources.  [place], Care Management  [phone] [address] , [place] [address] Email : [email or web address]

## 2020-10-19 ENCOUNTER — Other Ambulatory Visit: Payer: Self-pay

## 2020-10-19 ENCOUNTER — Ambulatory Visit: Payer: Medicare Other | Attending: Family Medicine | Admitting: Family Medicine

## 2020-10-19 ENCOUNTER — Encounter: Payer: Self-pay | Admitting: Family Medicine

## 2020-10-19 VITALS — BP 138/84 | HR 78 | Ht 66.0 in | Wt 196.0 lb

## 2020-10-19 DIAGNOSIS — E113493 Type 2 diabetes mellitus with severe nonproliferative diabetic retinopathy without macular edema, bilateral: Secondary | ICD-10-CM | POA: Diagnosis not present

## 2020-10-19 DIAGNOSIS — Z794 Long term (current) use of insulin: Secondary | ICD-10-CM

## 2020-10-19 DIAGNOSIS — R053 Chronic cough: Secondary | ICD-10-CM | POA: Diagnosis not present

## 2020-10-19 DIAGNOSIS — E1169 Type 2 diabetes mellitus with other specified complication: Secondary | ICD-10-CM | POA: Diagnosis not present

## 2020-10-19 DIAGNOSIS — E1122 Type 2 diabetes mellitus with diabetic chronic kidney disease: Secondary | ICD-10-CM | POA: Diagnosis not present

## 2020-10-19 DIAGNOSIS — I129 Hypertensive chronic kidney disease with stage 1 through stage 4 chronic kidney disease, or unspecified chronic kidney disease: Secondary | ICD-10-CM | POA: Diagnosis not present

## 2020-10-19 DIAGNOSIS — N1832 Chronic kidney disease, stage 3b: Secondary | ICD-10-CM

## 2020-10-19 DIAGNOSIS — Z23 Encounter for immunization: Secondary | ICD-10-CM | POA: Diagnosis not present

## 2020-10-19 DIAGNOSIS — E785 Hyperlipidemia, unspecified: Secondary | ICD-10-CM | POA: Diagnosis not present

## 2020-10-19 LAB — POCT GLYCOSYLATED HEMOGLOBIN (HGB A1C): HbA1c, POC (controlled diabetic range): 10.6 % — AB (ref 0.0–7.0)

## 2020-10-19 LAB — GLUCOSE, POCT (MANUAL RESULT ENTRY): POC Glucose: 210 mg/dl — AB (ref 70–99)

## 2020-10-19 MED ORDER — INSULIN GLARGINE 100 UNIT/ML SOLOSTAR PEN
PEN_INJECTOR | SUBCUTANEOUS | 6 refills | Status: DC
  Filled 2020-10-19: qty 21, fill #0

## 2020-10-19 MED ORDER — CETIRIZINE HCL 10 MG PO TABS
10.0000 mg | ORAL_TABLET | Freq: Every day | ORAL | 1 refills | Status: DC
  Filled 2020-10-19 – 2021-04-20 (×2): qty 30, 30d supply, fill #0
  Filled 2021-04-20: qty 60, 60d supply, fill #0

## 2020-10-19 MED ORDER — ATORVASTATIN CALCIUM 80 MG PO TABS
ORAL_TABLET | Freq: Every day | ORAL | 1 refills | Status: DC
  Filled 2020-10-19: qty 90, fill #0
  Filled 2021-01-01 – 2021-02-02 (×2): qty 30, 30d supply, fill #0
  Filled 2021-03-01: qty 30, 30d supply, fill #1
  Filled 2021-04-05: qty 30, 30d supply, fill #0

## 2020-10-19 MED ORDER — ZOSTER VAC RECOMB ADJUVANTED 50 MCG/0.5ML IM SUSR
0.5000 mL | Freq: Once | INTRAMUSCULAR | 1 refills | Status: AC
  Filled 2020-10-19: qty 0.5, 1d supply, fill #0

## 2020-10-19 MED ORDER — INSULIN GLARGINE 100 UNIT/ML SOLOSTAR PEN
25.0000 [IU] | PEN_INJECTOR | Freq: Two times a day (BID) | SUBCUTANEOUS | 6 refills | Status: DC
  Filled 2020-10-19: qty 30, 60d supply, fill #0
  Filled 2020-12-14: qty 45, 90d supply, fill #0

## 2020-10-19 NOTE — Patient Instructions (Signed)
Please increase Lantus to 25 units twice daily

## 2020-10-19 NOTE — Progress Notes (Signed)
Subjective:  Patient ID: Adrienne [name], female    DOB: 1955-12-16  Age: 65 y.o. MRN: [id number]  CC: Diabetes   HPI Adrienne [name] is a 65 y.o. 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  Interval History: Her Glucometer has stopped talking to her and she has not been able to check her blood sugars.  Needs to be updated as she has a talking glucometer and due to her visual loss is unable to read. She receives her meals from meals on wheels or her daughters and states that food is salty and she has not been eating a diabetic diet. A1c is 10.6 up from 8.0 previously.  Endorses compliance with Lantus and metformin.  Previously tried Trulicity but did have constipation and had to discontinue it.  She has a cough and has been taking a lot of candy. She also thinks she has not been absorbing insulin adequately. States cough was present to her having COVID but worsened after COVID. She has a lot of Mucus in her throat and cough is not restricted to any particular time of the day. She has no chest pain or dyspnea.  She is on lisinopril but has been on this for several years.  For chronic kidney disease she is not currently followed by nephrology. Past Medical History:  Diagnosis Date   Blindness of both eyes    Diabetes ([place])    High cholesterol    Hypertension     Past Surgical History:  Procedure Laterality Date   CATARACT EXTRACTION, BILATERAL     Laser eye surgery, cataract surgery bi-lat   CESAREAN SECTION     Twins, Breech   EYE SURGERY Bilateral 2012   LASER, CATARACT   TEE WITHOUT CARDIOVERSION N/A 12/31/2013   Procedure: TRANSESOPHAGEAL ECHOCARDIOGRAM (TEE);   Surgeon: Laverda Page, MD;  Location: [facility] ENDOSCOPY;  Service: Cardiovascular;  Laterality: N/A;  H&P in file   TUBAL LIGATION Bilateral     Family History  Problem Relation Age of Onset   Dementia Mother    Diabetes Father    Hypertension Father    Hypertension Sister    Diabetes Sister    Diabetes Sister    Heart disease Sister     Allergies  Allergen Reactions   Codeine Itching   Hydrocodone Itching   Oxycodone Itching   Penicillins Itching   Clindamycin/Lincomycin Itching   Niacin And Related Itching   Penicillin G Itching   Niacin Itching and Anxiety    Facial flushing    Outpatient Medications Prior to Visit  Medication Sig Dispense Refill   acetaminophen (TYLENOL) 500 MG tablet Take 500 mg by mouth every 6 (six) hours as needed for moderate pain.     aspirin EC 81 MG tablet Take 81 mg by mouth daily.      atorvastatin (LIPITOR) 80 MG tablet TAKE 1 TABLET (80 MG TOTAL) BY MOUTH DAILY. 90 tablet 1   benzonatate (TESSALON) 100 MG capsule TAKE 1 CAPSULE (100 MG TOTAL) BY MOUTH 2 (TWO) TIMES DAILY AS NEEDED FOR COUGH. 30 capsule 1   cholecalciferol (VITAMIN D3) 25 MCG (1000 UNIT) tablet Take 1 tablet (1,000 Units total) by mouth daily. 30 tablet 3   clopidogrel (PLAVIX)  75 MG tablet TAKE 1 TABLET (75 MG TOTAL) BY MOUTH DAILY. 90 tablet 1   Continuous Blood Gluc Sensor (FREESTYLE LIBRE 14 DAY SENSOR) MISC PLACE ONE SENSOR AND USE TO CHECK BLOOD SUGAR FOR 14 DAYS THEN REMOVE AND REPLACE. 2 each 2   famotidine (PEPCID) 20 MG tablet Take 1 tablet (20 mg total) by mouth 2 (two) times daily. 180 tablet 1   fluticasone (FLONASE) 50 MCG/ACT nasal spray PLACE 2 SPRAYS INTO BOTH NOSTRILS DAILY. 16 g 2   guaiFENesin-dextromethorphan (ROBITUSSIN DM) 100-10 MG/5ML syrup Take 5 mLs by mouth every 6 (six) hours as needed for cough. 118 mL 0   insulin glargine (LANTUS) 100 UNIT/ML Solostar Pen INJECT 22 UNITS INTO THE SKIN 2 (TWO) TIMES DAILY. 21 mL 6   Insulin Pen Needle 31G X 5 MM  MISC 1 Act by Does not apply route 4 (four) times daily - after meals and at bedtime. 100 each 11   losartan-hydrochlorothiazide (HYZAAR) 100-25 MG tablet TAKE 1 TABLET BY MOUTH DAILY. 90 tablet 1   metFORMIN (GLUCOPHAGE) 500 MG tablet Take 1 tablet (500 mg total) by mouth 2 (two) times daily with a meal. 180 tablet 1   Misc. Devices MISC Blood pressure monitor. Dx: Hypertension 1 each 0   No facility-administered medications prior to visit.     ROS Review of Systems  Constitutional:  Negative for activity change, appetite change and fatigue.  HENT:  Negative for congestion, sinus pressure and sore throat.   Eyes:  Positive for visual disturbance.  Respiratory:  Negative for cough, chest tightness, shortness of breath and wheezing.   Cardiovascular:  Negative for chest pain and palpitations.  Gastrointestinal:  Negative for abdominal distention, abdominal pain and constipation.  Endocrine: Negative for polydipsia.  Genitourinary:  Negative for dysuria and frequency.  Musculoskeletal:  Negative for arthralgias and back pain.  Skin:  Negative for rash.  Neurological:  Negative for tremors, light-headedness and numbness.  Hematological:  Does not bruise/bleed easily.  Psychiatric/Behavioral:  Negative for agitation and behavioral problems.    Objective:  BP 138/84   Pulse 78   Ht _0  (1.676 m)   Wt 196 lb (88.9 kg)   SpO2 100%   BMI 31.64 kg/m   BP/Weight 10/19/2020 3/55/7322 0/02/5425  Systolic BP 062 376 283  Diastolic BP 84 73 80  Wt. (Lbs) 196 191 191  BMI 31.64 30.83 30.83      Physical Exam Constitutional:      Appearance: She is well-developed.  Cardiovascular:     Rate and Rhythm: Normal rate.     Heart sounds: Normal heart sounds. No murmur heard. Pulmonary:     Effort: Pulmonary effort is normal.     Breath sounds: Normal breath sounds. No wheezing or rales.  Chest:     Chest wall: No tenderness.  Abdominal:     General: Bowel sounds are normal. There is  no distension.     Palpations: Abdomen is soft. There is no mass.     Tenderness: There is no abdominal tenderness.  Musculoskeletal:        General: Normal range of motion.     Right lower leg: No edema.     Left lower leg: No edema.  Neurological:     Mental Status: She is alert and oriented to person, place, and time.  Psychiatric:        Mood and Affect: Mood normal.    CMP Latest Ref Rng & Units 01/21/2020 10/21/2019 05/07/2019  Glucose 65 - 99 mg/dL 78 271(H) 81  BUN 8 - 27 mg/dL 31(H) 19 27  Creatinine 0.57 - 1.00 mg/dL 1.74(H) 1.67(H) 1.72(H)  Sodium 134 - 144 mmol/L 138 139 141  Potassium 3.5 - 5.2 mmol/L 4.0 4.3 3.8  Chloride 96 - 106 mmol/L 100 101 99  CO2 20 - 29 mmol/L _0 Calcium 8.7 - 10.3 mg/dL 10.1 9.6 10.0  Total Protein 6.0 - 8.5 g/dL 7.4 - 6.7  Total Bilirubin 0.0 - 1.2 mg/dL 0.3 - 0.3  Alkaline Phos 44 - 121 IU/L 99 - 95  AST 0 - 40 IU/L 15 - 14  ALT 0 - 32 IU/L 18 - 13    Lipid Panel     Component Value Date/Time   CHOL 202 (H) 05/07/2019 1049   TRIG 122 05/07/2019 1049   HDL 46 05/07/2019 1049   CHOLHDL 4.4 05/07/2019 1049   CHOLHDL 2.7 01/04/2016 1138   VLDL 12 01/04/2016 1138   LDLCALC 134 (H) 05/07/2019 1049    CBC    Component Value Date/Time   WBC 9.6 02/13/2019 2030   RBC 3.14 (L) 02/13/2019 2030   HGB 9.6 (L) 02/13/2019 2030   HCT 29.2 (L) 02/13/2019 2030   PLT 346 02/13/2019 2030   MCV 93.0 02/13/2019 2030   MCH 30.6 02/13/2019 2030   MCHC 32.9 02/13/2019 2030   RDW 12.6 02/13/2019 2030   LYMPHSABS 1.0 02/13/2019 2030   MONOABS 0.4 02/13/2019 2030   EOSABS 0.0 02/13/2019 2030   BASOSABS 0.0 02/13/2019 2030    Lab Results  Component Value Date   HGBA1C 10.6 (A) 10/19/2020    Assessment & Plan:  1. Type 2 diabetes mellitus with other specified complication, with long-term current use of insulin (HCC) Uncontrolled with A1c of 10.6; goal is less than 7.0 Not adhering to diabetic diet as she unfortunately relies on food  from Meals on Wheels and family Increase Lantus to 25 units twice daily Unable to tolerate GLP-1 agonist due to constipation Currently on metformin but if comprehensive metabolic panel reveals a GFR of less than 30 will need to discontinue altogethe - POCT glucose (manual entry) - POCT glycosylated hemoglobin (Hb A1C) - CMP14+EGFR - insulin glargine (LANTUS) 100 UNIT/ML Solostar Pen; Inject 25 Units into the skin 2 (two) times daily.  Dispense: 30 mL; Refill: 6  2. Stage 3b chronic kidney disease (HCC) Combination of hypertensive and diabetic retinopathy - CBC with Differential/Platelet - Ambulatory referral to Nephrology  3. 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 Discussed medication compliance, adverse effects.  5. Hyperlipidemia associated with type 2 diabetes mellitus ([place]) Due for lipid panel but is not fasting today Continue current dose of atorvastatin Low-cholesterol diet - atorvastatin (LIPITOR) 80 MG tablet; TAKE 1 TABLET (80 MG TOTAL) BY MOUTH DAILY.  Dispense: 90 tablet; Refill: 1  6. Chronic cough We will treat for chronic sinusitis Due to codeine allergy I am unable to place her on antitussive which will be covered by insurance company like Tussionex as [employer] is not covered Continue Flonase - cetirizine (ZYRTEC) 10 MG tablet; Take 1 tablet (10 mg total) by  mouth daily.  Dispense: 30 tablet; Refill: 1   Health Care Maintenance: Prescription for Shingrix sent to the pharmacy however she opts to wait for shingles vaccine.  Flu shot  administered today No orders of the defined types were placed in this encounter.   Follow-up: No follow-ups on file.       Charlott Rakes, MD, FAAFP. [facility] [place], [place]   10/19/2020, 4:43 PM

## 2020-10-20 ENCOUNTER — Other Ambulatory Visit: Payer: Self-pay

## 2020-10-20 LAB — CBC WITH DIFFERENTIAL/PLATELET
Basophils Absolute: 0.1 10*3/uL (ref 0.0–0.2)
Basos: 1 %
EOS (ABSOLUTE): 0.3 10*3/uL (ref 0.0–0.4)
Eos: 3 %
Hematocrit: 33.4 % — ABNORMAL LOW (ref 34.0–46.6)
Hemoglobin: 11.5 g/dL (ref 11.1–15.9)
Immature Grans (Abs): 0 10*3/uL (ref 0.0–0.1)
Immature Granulocytes: 0 %
Lymphocytes Absolute: 2.7 10*3/uL (ref 0.7–3.1)
Lymphs: 27 %
MCH: 31 pg (ref 26.6–33.0)
MCHC: 34.4 g/dL (ref 31.5–35.7)
MCV: 90 fL (ref 79–97)
Monocytes Absolute: 0.5 10*3/uL (ref 0.1–0.9)
Monocytes: 5 %
Neutrophils Absolute: 6.3 10*3/uL (ref 1.4–7.0)
Neutrophils: 64 %
Platelets: 508 10*3/uL — ABNORMAL HIGH (ref 150–450)
RBC: 3.71 x10E6/uL — ABNORMAL LOW (ref 3.77–5.28)
RDW: 11.8 % (ref 11.7–15.4)
WBC: 9.7 10*3/uL (ref 3.4–10.8)

## 2020-10-20 LAB — CMP14+EGFR
ALT: 14 IU/L (ref 0–32)
AST: 12 IU/L (ref 0–40)
Albumin/Globulin Ratio: 1.6 (ref 1.2–2.2)
Albumin: 4.6 g/dL (ref 3.8–4.8)
Alkaline Phosphatase: 123 IU/L — ABNORMAL HIGH (ref 44–121)
BUN/Creatinine Ratio: 12 (ref 12–28)
BUN: 20 mg/dL (ref 8–27)
Bilirubin Total: 0.3 mg/dL (ref 0.0–1.2)
CO2: 21 mmol/L (ref 20–29)
Calcium: 10.1 mg/dL (ref 8.7–10.3)
Chloride: 99 mmol/L (ref 96–106)
Creatinine, Ser: 1.63 mg/dL — ABNORMAL HIGH (ref 0.57–1.00)
Globulin, Total: 2.9 g/dL (ref 1.5–4.5)
Glucose: 226 mg/dL — ABNORMAL HIGH (ref 70–99)
Potassium: 4.4 mmol/L (ref 3.5–5.2)
Sodium: 138 mmol/L (ref 134–144)
Total Protein: 7.5 g/dL (ref 6.0–8.5)
eGFR: 35 mL/min/{1.73_m2} — ABNORMAL LOW (ref >59)

## 2020-10-20 MED FILL — Losartan Potassium & Hydrochlorothiazide Tab 100-25 MG: ORAL | 90 days supply | Qty: 90 | Fill #1 | Status: AC

## 2020-10-20 MED FILL — Clopidogrel Bisulfate Tab 75 MG (Base Equiv): ORAL | 90 days supply | Qty: 90 | Fill #1 | Status: AC

## 2020-11-02 ENCOUNTER — Other Ambulatory Visit: Payer: Self-pay

## 2020-11-06 ENCOUNTER — Other Ambulatory Visit: Payer: Self-pay

## 2020-11-09 ENCOUNTER — Other Ambulatory Visit: Payer: Self-pay | Admitting: *Deleted

## 2020-11-09 ENCOUNTER — Other Ambulatory Visit: Payer: Self-pay

## 2020-11-09 NOTE — Patient Outreach (Signed)
[name]) Care Management  [place]  11/09/2020   Adrienne [name] 1956/01/02 [id number]  RN Health Coach telephone call to patient.  [employer] compliance verified. Patient A1C is 10.6. Patient free style libre phone app has stopped verbally reading out her blood sugars. Per patient she has not been adhering her diet. Patient has been feeling down. Her lantus had been increased but she hasn't started it yet. Patient is unable to read blood sugars if she is hypoglycemic being legally blind. Patient has agreed to follow up outreach calls.    Encounter Medications:  Outpatient Encounter Medications as of 11/09/2020  Medication Sig Note   acetaminophen (TYLENOL) 500 MG tablet Take 500 mg by mouth every 6 (six) hours as needed for moderate pain.    aspirin EC 81 MG tablet Take 81 mg by mouth daily.     atorvastatin (LIPITOR) 80 MG tablet TAKE 1 TABLET (80 MG TOTAL) BY MOUTH DAILY.    benzonatate (TESSALON) 100 MG capsule TAKE 1 CAPSULE (100 MG TOTAL) BY MOUTH 2 (TWO) TIMES DAILY AS NEEDED FOR COUGH.    cetirizine (ZYRTEC) 10 MG tablet Take 1 tablet (10 mg total) by mouth daily.    cholecalciferol (VITAMIN D3) 25 MCG (1000 UNIT) tablet Take 1 tablet (1,000 Units total) by mouth daily.    clopidogrel (PLAVIX) 75 MG tablet TAKE 1 TABLET (75 MG TOTAL) BY MOUTH DAILY.    Continuous Blood Gluc Sensor (FREESTYLE LIBRE 14 DAY SENSOR) MISC PLACE ONE SENSOR AND USE TO CHECK BLOOD SUGAR FOR 14 DAYS THEN REMOVE AND REPLACE. 04/03/2019: Waiting on new sensor.   famotidine (PEPCID) 20 MG tablet Take 1 tablet (20 mg total) by mouth 2 (two) times daily.    fluticasone (FLONASE) 50 MCG/ACT nasal spray PLACE 2 SPRAYS INTO BOTH NOSTRILS DAILY.    guaiFENesin-dextromethorphan (ROBITUSSIN DM) 100-10 MG/5ML syrup Take 5 mLs by mouth every 6 (six) hours as needed for cough.    insulin glargine (LANTUS) 100 UNIT/ML Solostar Pen Inject 25 Units into the skin 2 (two) times daily.    Insulin  Pen Needle 31G X 5 MM MISC 1 Act by Does not apply route 4 (four) times daily - after meals and at bedtime.    losartan-hydrochlorothiazide (HYZAAR) 100-25 MG tablet TAKE 1 TABLET BY MOUTH DAILY.    metFORMIN (GLUCOPHAGE) 500 MG tablet Take 1 tablet (500 mg total) by mouth 2 (two) times daily with a meal.    Misc. Devices MISC Blood pressure monitor. Dx: Hypertension    No facility-administered encounter medications on file as of 11/09/2020.    Functional Status:  In your present state of health, do you have any difficulty performing the following activities: 07/16/2020  Hearing? N  Vision? 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 N  Walking or climbing stairs? Y  Dressing or bathing? Y  Doing errands, shopping? N  Preparing Food and eating ? Y  Using the Toilet? Y  In the past six months, have you accidently leaked urine? N  Do you have problems with loss of bowel control? N  Managing your Medications? Y  Managing your Finances? Y  Housekeeping or managing your Housekeeping? Y  Some recent data might be hidden    Fall/Depression Screening: Fall Risk  11/09/2020 08/11/2020 07/16/2020  Falls in the past year? 0 0 0  Comment - - -  Number falls in past yr: 0 0 0  Injury with Fall? 0 0 0  Risk for fall due to : - - Impaired vision  Follow up - - Education provided   [facility] 2/9 Scores 11/09/2020 07/16/2020 05/11/2020 04/03/2019 01/03/2018 08/29/2016 06/03/2016  PHQ - 2 Score 2 1 1 1 2 3  -  PHQ- 9 Score 3 - - - 6 10 -  Exception Documentation - - - - - - Patient refusal  Not completed - - - - - - -    Care Plan Care Plan : Diabetes Type 2 (Adult)  Updates made by [name], RN since 11/09/2020 12:00 AM     Problem: Glycemic Management (Diabetes, Type 2)   Priority: High  Onset Date: 12/05/2019     Long-Range Goal: Glycemic Management Optimized   Start Date: 12/05/2019  Expected End Date: 02/15/2021  This Visit's Progress: Not on track   Recent Progress: On track  Priority: High  Note:   Evidence-based guidance:  Anticipate A1C testing (point-of-care) every 3 to 6 months based on goal attainment.  Review mutually-set A1C goal or target range.  Anticipate use of antihyperglycemic with or without insulin and periodic adjustments; consider active involvement of pharmacist.  Provide medical nutrition therapy and development of individualized eating.  Compare self-reported symptoms of hypo or hyperglycemia to blood glucose levels, diet and fluid intake, current medications, psychosocial and physiologic stressors, change in activity and barriers to care adherence.  Promote self-monitoring of blood glucose levels.  Assess and address barriers to management plan, such as food insecurity, age, developmental ability, depression, anxiety, fear of hypoglycemia or weight gain, as well as medication cost, side effects and complicated regimen.  Consider referral to community-based diabetes education program, visiting nurse, community health worker or health coach.  Encourage regular dental care for treatment of periodontal disease; refer to dental provider when needed.   Notes:  [id number] Referred to patient for counseling     Task: Alleviate Barriers to Glycemic Management   Due Date: 02/15/2021  Note:   Care Management Activities:    - blood glucose monitoring encouraged - mutual A1C goal set or reviewed - self-awareness of signs/symptoms of hypo or hyperglycemia encouraged - use of blood glucose monitoring log promoted    Notes:  Patient is monitoring blood sugars now but difficult in reading due to total blindness 05/11/2020 RN notified Dr to see if pharmacy in office can assist patient in connected meter to phone.     Problem: Disease Progression (Diabetes, Type 2)   Priority: Medium  Onset Date: 12/05/2019     Long-Range Goal: Disease Progression Prevented or Minimized   Start Date: 12/05/2019  Expected End Date:  02/15/2021  This Visit's Progress: On track  Recent Progress: On track  Priority: Medium  Note:   Evidence-based guidance:  Prepare patient for laboratory and diagnostic exams based on risk and presentation.  Encourage lifestyle changes, such as increased intake of plant-based foods, stress reduction, consistent physical activity and smoking cessation to prevent long-term complications and chronic disease.   Individualize activity and exercise recommendations while considering potential limitations, such as neuropathy, retinopathy or the ability to prevent hyperglycemia or hypoglycemia.   Prepare patient for use of pharmacologic therapy that may include antihypertensive, analgesic, prostaglandin E1 with periodic adjustments, based on presenting chronic condition and laboratory results.  Assess signs/symptoms and risk factors for hypertension, sleep-disordered breathing, neuropathy (including changes in gait and balance), retinopathy, nephropathy and sexual dysfunction.  Address pregnancy planning and contraceptive choice, especially when prescribing antihypertensive or statin.  Ensure completion of annual comprehensive foot exam  and dilated eye exam.   Implement additional individualized goals and interventions based on identified risk factors.  Prepare patient for consultation or referral for specialist care, such as ophthalmology, neurology, cardiology, podiatry, nephrology or perinatology.   Notes:  [id number] Patient is following up with health maintenance. Received flu vaccine [id number]. Patient is scheduling Covid booster and Shingrex vaccine    Task: Monitor and Manage Follow-up for Comorbidities   Due Date: 02/15/2021  Note:   Care Management Activities:    - activity based on tolerance and functional limitations encouraged - completion of annual dilated eye exam confirmed - healthy lifestyle promoted - reduction of sedentary activity encouraged - signs/symptoms of comorbidities  identified    Notes:       Goals Addressed             This Visit's Progress    (THN)Make and Keep All Appointments   On track    Timeframe:  Long-Range Goal Priority:  Medium Start Date: 30076226                            Expected End Date:      33354562               Follow Up Date [id number]   - ask family or friend for a ride - call to cancel if needed - keep a calendar with prescription refill dates - keep a calendar with appointment dates    Why is this important?   Part of staying healthy is seeing the doctor for follow-up care.  If you forget your appointments, there are some things you can do to stay on track.    Notes:  Patient kept appt [id number] Next appointments scheduled 0302 Gastro and 0304 Mammogram and Bone density [id number] Patient was unable to keep dental appointment. Patient will reschedule. [id number] Patient has rescheduled appointments and received flu vaccine     (THN)Monitor and Manage My Blood Sugar   Not on track    Timeframe:  Short-Term Goal Priority:  High Start Date:   [id number]                          Expected End Date: 32122482            Follow Up Date 50037048   - check blood sugar at prescribed times - check blood sugar if I feel it is too high or too low - take the blood sugar log to all doctor visits - take the blood sugar meter to all doctor visits    Why is this important?   Checking your blood sugar at home helps to keep it from getting very high or very low.  Writing the results in a diary or log helps the doctor know how to care for you.  Your blood sugar log should have the time, date and the results.  Also, write down the amount of insulin or other medicine that you take.  Other information, like what you ate, exercise done and how you were feeling, will also be helpful.     Notes:  Discussed with patient about face timing son with meter so he can tell her what her readings are. Patient is totally blind RN referred to  pharmacy for any options to make free style libre to talk the blood sugar readings Patient has not gotten app on phone for verbal readings of CBG [id number] Patient is  unable to read her meter [id number] Patient has free style Elenor Legato programmed to phone [phone] Patient free style Elenor Legato phone programming has stopped talking. Patient will have to get reprogrammed     (THN)Perform Foot Care   On track    Timeframe:  Long-Range Goal Priority:  Medium Start Date: 67672094                            Expected End Date: 70962836     Follow Up Date 62947654  - trim toenails straight across - wash and dry feet carefully every day - wear comfortable, cotton socks - wear comfortable, well-fitting shoes    Why is this important?   Good foot care is very important when you have diabetes.  There are many things you can do to keep your feet healthy and catch a problem early.    Notes:  [id number] Patient feels feet for injuries.  [id number] LOV with PCP [phone]      (THN)Set My Target A1C   On track    Timeframe:  Short-Term Goal Priority:  High Start Date:      96759163                       Expected End Date:        84665993  Follow Up Date 57017793   - set target A1C    Why is this important?   Your target A1C is decided together by you and your doctor.  It is based on several things like your age and other health issues.    Notes:  Target is 7.0 but want to see a decrease from 10.3 01/25 A1C has decreased to 8.0 [id number] A1C is 8.0 30076226 A1C is 10.6        Plan:  Follow-up: Follow-up in 2 month(s) Referred to social worker RN discussed trouble shooting  to restart Free style [place] app Patient will follow up with Shingrex vaccine Patient will follow up with COVID booster Patient has started monitoring her diet RN sent update assessment to PCP  [place] Management [phone]

## 2020-11-18 ENCOUNTER — Other Ambulatory Visit: Payer: Self-pay | Admitting: *Deleted

## 2020-11-18 ENCOUNTER — Telehealth: Payer: Self-pay

## 2020-11-18 ENCOUNTER — Telehealth: Payer: Self-pay | Admitting: *Deleted

## 2020-11-18 NOTE — Patient Outreach (Signed)
[name]) Care Management  11/18/2020  Adrienne [name] 1955/06/15 [id number]   CSW spoke with pt today by phone- she reports some depression and feeling "dark" in the midst of her actual blindness darkness too. She is not interested at this time in trying RX to help manage her symptoms because of past "I didn't like the way they made me feel".  She is interested in seeking counseling support and is open to a referral being placed for this.  CSW discussed plans for pt to be linked with a counselor and to expect a phone call from them to schedule.   CSW completed the PHQ9 Depression screening with pt.  Pt scored "8" (high end of mild).  Pt admits to "overeating" as well as sleeping too much.   CSW validated her feelings and commended her her willingness to participate in counseling.   CSW will touch base with pt again in 3 weeks.   [name], MSW, [place] Worker  [place] [phone]

## 2020-11-25 ENCOUNTER — Telehealth: Payer: Self-pay | Admitting: Family Medicine

## 2020-11-25 NOTE — Telephone Encounter (Signed)
Pt niece came in clinic to drop off Medical Info form from [employer]. Advised will put in Dr. Margarita Rana box.

## 2020-11-26 NOTE — Telephone Encounter (Signed)
Paperwork has been recieved

## 2020-12-04 DIAGNOSIS — Z794 Long term (current) use of insulin: Secondary | ICD-10-CM | POA: Diagnosis not present

## 2020-12-04 DIAGNOSIS — E119 Type 2 diabetes mellitus without complications: Secondary | ICD-10-CM | POA: Diagnosis not present

## 2020-12-07 ENCOUNTER — Other Ambulatory Visit: Payer: Self-pay | Admitting: *Deleted

## 2020-12-07 DIAGNOSIS — E7800 Pure hypercholesterolemia, unspecified: Secondary | ICD-10-CM | POA: Diagnosis not present

## 2020-12-07 DIAGNOSIS — E559 Vitamin D deficiency, unspecified: Secondary | ICD-10-CM | POA: Diagnosis not present

## 2020-12-07 DIAGNOSIS — H548 Legal blindness, as defined in USA: Secondary | ICD-10-CM | POA: Diagnosis not present

## 2020-12-07 DIAGNOSIS — D649 Anemia, unspecified: Secondary | ICD-10-CM | POA: Diagnosis not present

## 2020-12-07 DIAGNOSIS — N183 Chronic kidney disease, stage 3 unspecified: Secondary | ICD-10-CM | POA: Diagnosis not present

## 2020-12-07 DIAGNOSIS — I129 Hypertensive chronic kidney disease with stage 1 through stage 4 chronic kidney disease, or unspecified chronic kidney disease: Secondary | ICD-10-CM | POA: Diagnosis not present

## 2020-12-07 NOTE — Patient Outreach (Signed)
[facility]) Care Management  12/07/2020  Adrienne [name] Sep 01, 1955 [id number]   CSW spoke with pt by phone today- she admits to being a bit "down"; has not connected with the counseling agency referred to- CSW will inquire about the status and request appointment/outreach asap for virtual session.  Pt reports having a temporary renter in the home which helps with finances but not the best fit. She is encouraged to speak up and consider seeking another option as she had a travel Nurse that was a good fit.  Pt also is pleased to have heard back from [place] [facility] and was told they will be reaching out to her to arange training in [place]- this was in the works about a year ago so hopeful it will transpire soon for pt to get trained for employment options.   Pt shared with CSW plans to have Thanksgiving at her sisters home- and to see Kidney specialist today.   CSW offered support and will coordinate the counselor to reconnect.   [name], MSW, [place] Worker  [place] [phone]

## 2020-12-09 ENCOUNTER — Other Ambulatory Visit: Payer: Self-pay

## 2020-12-14 ENCOUNTER — Other Ambulatory Visit: Payer: Self-pay

## 2020-12-17 ENCOUNTER — Telehealth: Payer: Self-pay | Admitting: *Deleted

## 2020-12-17 ENCOUNTER — Other Ambulatory Visit: Payer: Self-pay | Admitting: *Deleted

## 2020-12-17 NOTE — Patient Outreach (Signed)
[facility]) Care Management  12/17/2020  Adrienne [name] 01-04-1956 [id number]   RN Health Coach received telephone call from patient.  [employer] compliance verified. Per patient [name] has arranged for counseling. She stated the call came through and she had an emergency and was unable to answer. She wanted to make [name] aware since she was unable to return call.   RN notified Arville Go of the information above.  [facility] Care Management [phone]

## 2020-12-17 NOTE — Patient Outreach (Addendum)
[facility]) Care Management  12/17/2020  Adrienne [name] 04-28-1955 [id number]  Due to pt not being able to do "virtual" set up for a "virtual/telehealth" counseling, the facilitator had to call pt's Insurance and appeal their initial denial- given pt's visual impairment, and were able to get approval.  Pt has been linked with a therapist through [facility] [phone]) and appointment set for tomorrow at 11am with [name]. Pt aware and plans to be available at that time for phone call/session.  [name], MSW, [place] Worker  [place] [phone]

## 2021-01-01 ENCOUNTER — Other Ambulatory Visit: Payer: Self-pay | Admitting: Family Medicine

## 2021-01-01 ENCOUNTER — Other Ambulatory Visit: Payer: Self-pay

## 2021-01-01 DIAGNOSIS — E1169 Type 2 diabetes mellitus with other specified complication: Secondary | ICD-10-CM

## 2021-01-01 DIAGNOSIS — I129 Hypertensive chronic kidney disease with stage 1 through stage 4 chronic kidney disease, or unspecified chronic kidney disease: Secondary | ICD-10-CM

## 2021-01-01 DIAGNOSIS — Z794 Long term (current) use of insulin: Secondary | ICD-10-CM

## 2021-01-01 DIAGNOSIS — E785 Hyperlipidemia, unspecified: Secondary | ICD-10-CM

## 2021-01-01 DIAGNOSIS — E1122 Type 2 diabetes mellitus with diabetic chronic kidney disease: Secondary | ICD-10-CM

## 2021-01-01 MED ORDER — LOSARTAN POTASSIUM-HCTZ 100-25 MG PO TABS
1.0000 | ORAL_TABLET | Freq: Every day | ORAL | 0 refills | Status: DC
  Filled 2021-01-01: qty 90, 90d supply, fill #0

## 2021-01-01 MED ORDER — CLOPIDOGREL BISULFATE 75 MG PO TABS
ORAL_TABLET | Freq: Every day | ORAL | 0 refills | Status: DC
  Filled 2021-01-01: qty 90, 90d supply, fill #0

## 2021-01-01 MED ORDER — METFORMIN HCL 500 MG PO TABS
500.0000 mg | ORAL_TABLET | Freq: Two times a day (BID) | ORAL | 0 refills | Status: DC
  Filled 2021-01-01: qty 180, 90d supply, fill #0

## 2021-01-04 ENCOUNTER — Other Ambulatory Visit: Payer: Self-pay

## 2021-01-04 ENCOUNTER — Other Ambulatory Visit: Payer: Self-pay | Admitting: *Deleted

## 2021-01-04 NOTE — Patient Outreach (Signed)
Adrienne [name]) Care Management   01/04/2021  Adrienne [name] 12/31/1955 [id number]  Adrienne [name] is an 65 y.o. female  Subjective: Pt awaiting follow up from the [employer] on plans for vocational training that she has applied for through [place].  She is optimistic as well tells me she had a few positive counseling sessions but has found out her therapist is leaving so is awaiting being set up with a new counselor provider.  "I am going to stay positive through the holidays and if depression pops up I am going to push it aside".  CSW validated and encouraged pt stay focused on the family and fun plan they have; as well as to practice mindfulness and self care.  Pt is still looking for someone to come and read her mail to her- suggested she ask the meals on wheels delivery people and I will also look into options.  Encounter Medications:   Outpatient Encounter Medications as of 01/04/2021  Medication Sig Note   acetaminophen (TYLENOL) 500 MG tablet Take 500 mg by mouth every 6 (six) hours as needed for moderate pain.    aspirin EC 81 MG tablet Take 81 mg by mouth daily.     atorvastatin (LIPITOR) 80 MG tablet TAKE 1 TABLET (80 MG TOTAL) BY MOUTH DAILY.    benzonatate (TESSALON) 100 MG capsule TAKE 1 CAPSULE (100 MG TOTAL) BY MOUTH 2 (TWO) TIMES DAILY AS NEEDED FOR COUGH.    cetirizine (ZYRTEC) 10 MG tablet Take 1 tablet (10 mg total) by mouth daily.    cholecalciferol (VITAMIN D3) 25 MCG (1000 UNIT) tablet Take 1 tablet (1,000 Units total) by mouth daily.    clopidogrel (PLAVIX) 75 MG tablet TAKE 1 TABLET (75 MG TOTAL) BY MOUTH DAILY.    Continuous Blood Gluc Sensor (FREESTYLE LIBRE 14 DAY SENSOR) MISC PLACE ONE SENSOR AND USE TO CHECK BLOOD SUGAR FOR 14 DAYS THEN REMOVE AND REPLACE. 04/03/2019: Waiting on new sensor.   famotidine (PEPCID) 20 MG tablet Take 1 tablet (20 mg total) by mouth 2 (two) times daily.    fluticasone (FLONASE) 50 MCG/ACT nasal  spray PLACE 2 SPRAYS INTO BOTH NOSTRILS DAILY.    guaiFENesin-dextromethorphan (ROBITUSSIN DM) 100-10 MG/5ML syrup Take 5 mLs by mouth every 6 (six) hours as needed for cough.    insulin glargine (LANTUS) 100 UNIT/ML Solostar Pen Inject 25 Units into the skin 2 (two) times daily.    Insulin Pen Needle 31G X 5 MM MISC 1 Act by Does not apply route 4 (four) times daily - after meals and at bedtime.    losartan-hydrochlorothiazide (HYZAAR) 100-25 MG tablet TAKE 1 TABLET BY MOUTH DAILY.    metFORMIN (GLUCOPHAGE) 500 MG tablet Take 1 tablet (500 mg total) by mouth 2 (two) times daily with a meal.    Misc. Devices MISC Blood pressure monitor. Dx: Hypertension    No facility-administered encounter medications on file as of 01/04/2021.    Functional Status:   In your present state of health, do you have any difficulty performing the following activities: 07/16/2020  Hearing? N  Vision? 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 N  Walking or climbing stairs? Y  Dressing or bathing? Y  Doing errands, shopping? N  Preparing Food and eating ? Y  Using the Toilet? Y  In the past six months, have you accidently leaked urine? N  Do you have problems with loss of bowel control? N  Managing your Medications? Y  Managing your Finances? Y  Housekeeping or managing your Housekeeping? Y  Some recent data might be hidden    Fall/Depression Screening:    Fall Risk  11/09/2020 08/11/2020 07/16/2020  Falls in the past year? 0 0 0  Comment - - -  Number falls in past yr: 0 0 0  Injury with Fall? 0 0 0  Risk for fall due to : - - Impaired vision  Follow up - - Education provided   [name] 2/9 Scores 11/18/2020 11/09/2020 07/16/2020 05/11/2020 04/03/2019 01/03/2018 08/29/2016  PHQ - 2 Score 1 2 1 1 1 2 3   PHQ- 9 Score 8 3 - - - 6 10  Exception Documentation - - - - - - -  Not completed - - - - - - -    Assessment:    Goals Addressed   None     Plan:  Follow-up: Follow-up  in 3-4 week(s)  Adrienne [name], MSW, [place] Worker  [place] [phone]

## 2021-01-08 ENCOUNTER — Ambulatory Visit: Payer: Self-pay

## 2021-01-08 NOTE — Telephone Encounter (Signed)
Pts son tested positive for covid with a home test last night and symptoms started last night / Pt has no symptoms but wants to know what she can do and if she can go out or be around others/ please advise   Left message to call back.

## 2021-01-08 NOTE — Telephone Encounter (Signed)
Reason for Disposition  [1] CLOSE CONTACT COVID-19 EXPOSURE within last 14 days AND [2] weak immune system (e.g., HIV positive, cancer chemo, splenectomy, organ transplant, chronic steroids) AND [3] NO symptoms  Answer Assessment - Initial Assessment Questions 1. COVID-19 EXPOSURE: "Please describe how you were exposed to someone with a COVID-19 infection."     Pt's son has covid and pt was exposed to him.   Pt is not having any symptoms. 2. PLACE of CONTACT: "Where were you when you were exposed to COVID-19?" (e.g., home, school, medical waiting room; which city?)     *No Answer* 3. TYPE of CONTACT: "How much contact was there?" (e.g., sitting next to, live in same house, work in same office, same building)     Contact for 24 hrs  Pt has not tested for covid  4. DURATION of CONTACT: "How long were you in contact with the COVID-19 patient?" (e.g., a few seconds, passed by person, a few minutes, 15 minutes or longer, live with the patient)     24 hrs 5. MASK: "Were you wearing a mask?" "Was the other person wearing a mask?" Note: wearing a mask reduces the risk of an otherwise close contact.     No masks 6. DATE of CONTACT: "When did you have contact with a COVID-19 patient?" (e.g., how many days ago)     *No Answer* 7. COMMUNITY SPREAD: "Are there lots of cases of COVID-19 (community spread) where you live?" (See public health department website, if unsure)       *No Answer* 8. SYMPTOMS: "Do you have any symptoms?" (e.g., fever, cough, breathing difficulty, loss of taste or smell)     No symptoms 9. VACCINE: "Have you gotten the COVID-19 vaccine?" If Yes, ask: "Which one, how many shots, when did you get it?"     First 2 10. BOOSTER: "Have you received your COVID-19 booster?" If Yes, ask: "Which one and when did you get it?"       2boosters 11. PREGNANCY OR POSTPARTUM: "Is there any chance you are pregnant?" "When was your last menstrual period?" "Did you deliver in the last 2 weeks?"        N/A 12. HIGH RISK: "Do you have any heart or lung problems?" (e.g., asthma , COPD, heart failure) "Do you have a weak immune system or other risk factors?" (e.g., HIV positive, chemotherapy, renal failure, diabetes mellitus, sickle cell anemia, obesity)       I have diabetes and heart problems. 13. TRAVEL: "Have you traveled out of the country recently?" If Yes, ask: "When and where?"  Note: Travel becomes less relevant if there is widespread community transmission where the patient lives.       Not asked  Protocols used: Coronavirus (PJSRP-59) Exposure-A-AH  Chief Complaint: exposure to Covid. Symptoms: No symptoms Frequency: N/A Pertinent Negatives: Patient denies N/A Disposition: [] ED /[] Urgent Care (no appt availability in office) / [] Appointment(In office/virtual)/ []  [facility]/ [x] Home Care/ [] Refused Recommended Disposition  Additional Notes: Pt is blind and is requesting that Dr. Margarita Rana send in an antiviral [facility] to [facility] [address] in case she were to start having symptoms over the holidays.    She has not tested for Covid.

## 2021-01-09 NOTE — Telephone Encounter (Signed)
Yes, she is able to go out and be around others. If she has symptoms she will need to be tested.

## 2021-01-15 ENCOUNTER — Other Ambulatory Visit: Payer: Self-pay | Admitting: *Deleted

## 2021-01-15 NOTE — Patient Instructions (Signed)
Visit Information  Thank you for taking time to visit with me today. Please don't hesitate to contact me if I can be of assistance to you before our next scheduled telephone appointment.  Following are the goals we discussed today:  Current Barriers:  Knowledge Deficits related to plan of care for management of DMII   RNCM Clinical Goal(s):  Patient will verbalize understanding of plan for management of DMII as evidenced by continuation of monitoring blood sugars and adhering to diabetic diet through collaboration with RN Care manager, provider, and care team.   Interventions: Inter-disciplinary care team collaboration (see longitudinal plan of care) Evaluation of current treatment plan related to  self management and patient's adherence to plan as established by provider  Patient Goals/Self-Care Activities: Take medications as prescribed   Attend all scheduled provider appointments Call pharmacy for medication refills 3-7 days in advance of running out of medications Call provider office for new concerns or questions  Work with the social worker to address care coordination needs and will continue to work with the clinical team to address health care and disease management related needs call the Suicide and Crisis Lifeline: 988 if experiencing a Mental Health or [place]  schedule appointment with eye doctor check blood sugar at prescribed times: 4 times daily take the blood sugar meter to all doctor visits drink 6 to 8 glasses of water each day   Our next appointment is by telephone on March 2023  Please call the [facility] at [phone] if you need to cancel or reschedule your appointment.   Please call the Suicide and Crisis Lifeline: 988 if you are experiencing a Mental Health or [place] or need someone to talk to.  The patient verbalized understanding of instructions, educational materials, and care plan provided today and declined offer  to receive copy of patient instructions, educational materials, and care plan.  Patient is blind.   Telephone follow up appointment with care management team member scheduled for: The patient has been provided with contact information for the care management team and has been advised to call with any health related questions or concerns.   [place] Care Management [phone]

## 2021-01-15 NOTE — Patient Outreach (Signed)
[facility]) Care Management [place] Note   01/15/2021 Name:  RHINA KRAMME MRN:  [id number] DOB:  11-13-1955  Summary: Patient has not monitored her blood sugar at this morning. She had just awakened. Patient is receiving meals and wheels and stated she really likes it. Per patient her son had tested positive for COVID. She has tested negative.   Recommendations/Changes made from today's visit: Adhere to diabetic diet Monitor next A1C level RN ordered patient free Covid testing kits  Subjective: AMARILIS BELFLOWER is an 65 y.o. year old female who is a primary patient of Charlott Rakes, MD. The care management team was consulted for assistance with care management and/or care coordination needs.    RN Health Coach completed Telephone Visit today.    Medications Reviewed Today     Reviewed by [name], RN (Case Manager) on 11/09/20 at [place] List Status: <None>   Medication Order Taking? Sig Documenting Provider Last Dose Status Informant  acetaminophen (TYLENOL) 500 MG tablet [id number] No Take 500 mg by mouth every 6 (six) hours as needed for moderate pain. [provider] Taking Active Self  aspirin EC 81 MG tablet [id number] No Take 81 mg by mouth daily.  [provider] Taking Active Self           Med Note [place] Silos, AMY C   Sat Sep 05, 2015 11:06 PM)    atorvastatin (LIPITOR) 80 MG tablet [id number]  TAKE 1 TABLET (80 MG TOTAL) BY MOUTH DAILY. Charlott Rakes, MD  Active   benzonatate (TESSALON) 100 MG capsule [id number] No TAKE 1 CAPSULE (100 MG TOTAL) BY MOUTH 2 (TWO) TIMES DAILY AS NEEDED FOR COUGH. Charlott Rakes, MD Taking Active   cetirizine (ZYRTEC) 10 MG tablet [id number]  Take 1 tablet (10 mg total) by mouth daily. Charlott Rakes, MD  Active   cholecalciferol (VITAMIN D3) 25 MCG (1000 UNIT) tablet [id number] No Take 1 tablet (1,000 Units total) by mouth daily. Charlott Rakes, MD Taking Active   clopidogrel  (PLAVIX) 75 MG tablet [id number] No TAKE 1 TABLET (75 MG TOTAL) BY MOUTH DAILY. Charlott Rakes, MD Taking Active   Continuous Blood Gluc Sensor (FREESTYLE LIBRE 14 DAY SENSOR) [place] [id number] No PLACE ONE SENSOR AND USE TO CHECK BLOOD SUGAR FOR 14 DAYS THEN REMOVE AND REPLACE. Charlott Rakes, MD Taking Active Self           Med Note Radene Ou Apr 03, 2019  1:06 PM) Waiting on new sensor.  famotidine (PEPCID) 20 MG tablet [id number] No Take 1 tablet (20 mg total) by mouth 2 (two) times daily. Charlott Rakes, MD Taking Active Self  fluticasone (FLONASE) 50 MCG/ACT nasal spray [id number] No PLACE 2 SPRAYS INTO BOTH NOSTRILS DAILY. Charlott Rakes, MD Taking Active   guaiFENesin-dextromethorphan [facility] DM) 100-10 MG/5ML syrup [id number] No Take 5 mLs by mouth every 6 (six) hours as needed for cough. Arrien, Jimmy Picket, MD Taking Active   insulin glargine (LANTUS) 100 UNIT/ML Solostar Pen [id number]  Inject 25 Units into the skin 2 (two) times daily. Charlott Rakes, MD  Active   Insulin Pen Needle 31G X 5 MM MISC [id number] No 1 Act by Does not apply route 4 (four) times daily - after meals and at bedtime. Charlott Rakes, MD Taking Active Self  losartan-hydrochlorothiazide (HYZAAR) 100-25 MG tablet [id number] No TAKE 1 TABLET BY MOUTH DAILY. Charlott Rakes, MD Taking Active   metFORMIN (GLUCOPHAGE) 500 MG tablet [id number] No Take  1 tablet (500 mg total) by mouth 2 (two) times daily with a meal. Charlott Rakes, MD Taking Active   Misc. Devices [place] [id number] No Blood pressure monitor. Dx: Hypertension Charlott Rakes, MD Taking Active Self             SDOH:  (Social Determinants of Health) assessments and interventions performed:  SDOH Interventions    Flowsheet Row Most Recent Value  SDOH Interventions   Food Insecurity Interventions Intervention Not Indicated  Housing Interventions Intervention Not Indicated  Transportation Interventions SCAT (Specialized Community Area  Transporation)       Care Plan  Review of patient past medical history, allergies, medications, health status, including review of consultants reports, laboratory and other test data, was performed as part of comprehensive evaluation for care management services.   Care Plan : Diabetes Type 2 (Adult)  Updates made by [name], RN since 01/15/2021 12:00 AM     Problem: Glycemic Management (Diabetes, Type 2) Resolved 01/15/2021  Priority: High  Onset Date: 12/05/2019  Note:   [id number] Resolving due to duplicate goal     Long-Range Goal: Glycemic Management Optimized Completed 01/15/2021  Start Date: 12/05/2019  Expected End Date: 02/15/2021  Recent Progress: Not on track  Priority: High  Note:   Evidence-based guidance:  Anticipate A1C testing (point-of-care) every 3 to 6 months based on goal attainment.  Review mutually-set A1C goal or target range.  Anticipate use of antihyperglycemic with or without insulin and periodic adjustments; consider active involvement of pharmacist.  Provide medical nutrition therapy and development of individualized eating.  Compare self-reported symptoms of hypo or hyperglycemia to blood glucose levels, diet and fluid intake, current medications, psychosocial and physiologic stressors, change in activity and barriers to care adherence.  Promote self-monitoring of blood glucose levels.  Assess and address barriers to management plan, such as food insecurity, age, developmental ability, depression, anxiety, fear of hypoglycemia or weight gain, as well as medication cost, side effects and complicated regimen.  Consider referral to community-based diabetes education program, visiting nurse, community health worker or health coach.  Encourage regular dental care for treatment of periodontal disease; refer to dental provider when needed.   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     Problem: Disease Progression (Diabetes, Type 2) Resolved 01/15/2021  Priority: Medium  Onset Date: 12/05/2019  Note:   [id number] Resolving due to duplicate goal     Long-Range Goal: Disease Progression Prevented or Minimized Completed 01/15/2021  Start Date: 12/05/2019  Expected End Date: 02/15/2021  Recent Progress: On track  Priority: Medium  Note:   Evidence-based guidance:  Prepare patient for laboratory and diagnostic exams based on risk and presentation.  Encourage lifestyle changes, such as increased intake of plant-based foods, stress reduction, consistent physical activity and smoking cessation to prevent long-term complications and chronic disease.   Individualize activity and exercise recommendations while considering potential limitations, such as neuropathy, retinopathy or the ability to prevent hyperglycemia  or hypoglycemia.   Prepare patient for use of pharmacologic therapy that may include antihypertensive, analgesic, prostaglandin E1 with periodic adjustments, based on presenting chronic condition and laboratory results.  Assess signs/symptoms and risk factors for hypertension, sleep-disordered breathing, neuropathy (including changes in gait and balance), retinopathy, nephropathy and sexual dysfunction.  Address pregnancy planning and contraceptive choice, especially when prescribing antihypertensive or statin.  Ensure completion of annual comprehensive foot exam and dilated eye exam.   Implement  additional individualized goals and interventions based on identified risk factors.  Prepare patient for consultation or referral for specialist care, such as ophthalmology, neurology, cardiology, podiatry, nephrology or perinatology.   Notes:  [id number] Patient is following up with health maintenance. Received flu vaccine [id number]. Patient is scheduling Covid booster and Shingrex vaccine    Task: Monitor and Manage Follow-up for Comorbidities Completed 01/15/2021  Due Date: 02/15/2021  Note:   Care Management Activities:    - activity based on tolerance and functional limitations encouraged - completion of annual dilated eye exam confirmed - healthy lifestyle promoted - reduction of sedentary activity encouraged - signs/symptoms of comorbidities identified    Notes:     Care Plan : RN Care Manager Plan of Care  Updates made by Selam Pietsch, Eppie Gibson, RN since 01/15/2021 12:00 AM     Problem: Knowledge Deficit Related to Diabetes and Care Coordination Needs   Priority: High     Long-Range Goal: Development Plan of Care fro Management of Diabetes   Start Date: 01/15/2021  Expected End Date: 01/15/2022  Priority: High  Note:   Current Barriers:  Knowledge Deficits related to plan of care for management of DMII   RNCM Clinical Goal(s):  Patient will verbalize understanding of plan for management of DMII as evidenced by continuation of monitoring blood sugars and adhering to diabetic diet  through collaboration with RN Care manager, provider, and care team.   Interventions: Inter-disciplinary care team collaboration (see longitudinal plan of care) Evaluation of current treatment plan related to  self management and patient's adherence to plan as established by provider  Patient Goals/Self-Care Activities: Take medications as prescribed   Attend all scheduled provider appointments Call pharmacy for medication refills 3-7 days in advance of running out of medications Call provider  office for new concerns or questions  Work with the social worker to address care coordination needs and will continue to work with the clinical team to address health care and disease management related needs call the Suicide and Crisis Lifeline: 988 if experiencing a Mental Health or [place]  schedule appointment with eye doctor check blood sugar at prescribed times: 4 times daily take the blood sugar meter to all doctor visits drink 6 to 8 glasses of water each day        Plan: Telephone follow up appointment with care management team member scheduled for:  March 2023 The patient has been provided with contact information for the care management team and has been advised to call with any health related questions or concerns.  RN ordered free Covid test [place] Management [phone]

## 2021-01-19 ENCOUNTER — Telehealth: Payer: Self-pay

## 2021-01-19 ENCOUNTER — Ambulatory Visit: Payer: Medicare Other | Attending: Family Medicine | Admitting: Family Medicine

## 2021-01-19 ENCOUNTER — Encounter: Payer: Self-pay | Admitting: Family Medicine

## 2021-01-19 ENCOUNTER — Other Ambulatory Visit: Payer: Self-pay

## 2021-01-19 VITALS — BP 136/79 | HR 83

## 2021-01-19 DIAGNOSIS — N1832 Chronic kidney disease, stage 3b: Secondary | ICD-10-CM | POA: Diagnosis not present

## 2021-01-19 DIAGNOSIS — R110 Nausea: Secondary | ICD-10-CM

## 2021-01-19 DIAGNOSIS — M25512 Pain in left shoulder: Secondary | ICD-10-CM

## 2021-01-19 DIAGNOSIS — E1169 Type 2 diabetes mellitus with other specified complication: Secondary | ICD-10-CM

## 2021-01-19 DIAGNOSIS — Z794 Long term (current) use of insulin: Secondary | ICD-10-CM

## 2021-01-19 DIAGNOSIS — R61 Generalized hyperhidrosis: Secondary | ICD-10-CM | POA: Diagnosis not present

## 2021-01-19 DIAGNOSIS — G8929 Other chronic pain: Secondary | ICD-10-CM

## 2021-01-19 DIAGNOSIS — F320 Major depressive disorder, single episode, mild: Secondary | ICD-10-CM

## 2021-01-19 LAB — POCT GLYCOSYLATED HEMOGLOBIN (HGB A1C): HbA1c, POC (controlled diabetic range): 12.9 % — AB (ref 0.0–7.0)

## 2021-01-19 MED ORDER — ZOSTER VAC RECOMB ADJUVANTED 50 MCG/0.5ML IM SUSR: 0.5000 mL | Freq: Once | INTRAMUSCULAR | 1 refills | Status: AC

## 2021-01-19 MED ORDER — ZOSTER VAC RECOMB ADJUVANTED 50 MCG/0.5ML IM SUSR: 0.5000 mL | Freq: Once | INTRAMUSCULAR | 1 refills | Status: DC

## 2021-01-19 MED ORDER — DICLOFENAC SODIUM 1 % EX GEL
4.0000 g | Freq: Four times a day (QID) | CUTANEOUS | 1 refills | Status: AC
Start: 2021-01-19 — End: ?
  Filled 2021-01-19: qty 100, fill #0

## 2021-01-19 MED ORDER — INSULIN GLARGINE 100 UNIT/ML SOLOSTAR PEN
30.0000 [IU] | PEN_INJECTOR | Freq: Two times a day (BID) | SUBCUTANEOUS | 6 refills | Status: DC
  Filled 2021-01-19 – 2021-03-19 (×3): qty 30, 50d supply, fill #0
  Filled 2021-05-10: qty 30, 50d supply, fill #1
  Filled 2021-07-13: qty 30, 50d supply, fill #2

## 2021-01-19 MED ORDER — METOCLOPRAMIDE HCL 5 MG PO TABS
5.0000 mg | ORAL_TABLET | Freq: Three times a day (TID) | ORAL | 1 refills | Status: DC | PRN
  Filled 2021-01-19 – 2021-07-13 (×2): qty 90, 30d supply, fill #0

## 2021-01-19 NOTE — Telephone Encounter (Signed)
Opened in error

## 2021-01-19 NOTE — Progress Notes (Signed)
Virtual Visit via Telephone Note  I connected with Adrienne [name], on 01/19/2021 at 3:09 PM by telephone due to the COVID-19 pandemic and verified that I am speaking with the correct person using two identifiers.   Consent: I discussed the limitations, risks, security and privacy concerns of performing an evaluation and management service by telephone and the availability of in person appointments. I also discussed with the patient that there may be a patient responsible charge related to this service. The patient expressed understanding and agreed to proceed.   Location of Patient: Clinic  Location of Provider: Home Office   Persons participating in Telemedicine visit: Phillippa Straub [place]-CMA Dr. Margarita Rana     History of Present Illness: Adrienne [name] is a 66 y.o. year old female with a history of uncontrolled type 2 diabetes mellitus (A1c 12.9), anemia, central retinal artery occlusion (previously followed by [facility]), glaucoma (insertion of aqueous shunt due to severe primary open angle glaucoma of the left eye ), legally blind, hypertension, history of COVID-19 who presents today for follow-up visit.    She states she does not feel well. She has been depressed and staying in her bedroom She plans on starting Psychotherapy in 2 days. She does not feel like eating as she is nauseated. She declines medication for depression She is in a support group for the visuallly impaired Her blood sugars have elevated in the 400s. She has not been exercising and has been drinking a lot of ginger ale due to feeling bloated She complains of sweating from her wrist to her elbows more but she has on the R. This occurs intermittently. Her L shoulder bothers her more at night especially when she sleeps on it. Past Medical History:  Diagnosis Date   Blindness of both eyes    Diabetes (HCC)    High cholesterol    Hypertension    Allergies  Allergen  Reactions   Codeine Itching   Hydrocodone Itching   Oxycodone Itching   Penicillins Itching   Clindamycin/Lincomycin Itching   Niacin And Related Itching   Penicillin G Itching   Niacin Itching and Anxiety    Facial flushing    Current Outpatient Medications on File Prior to Visit  Medication Sig Dispense Refill   acetaminophen (TYLENOL) 500 MG tablet Take 500 mg by mouth every 6 (six) hours as needed for moderate pain.     aspirin EC 81 MG tablet Take 81 mg by mouth daily.      atorvastatin (LIPITOR) 80 MG tablet TAKE 1 TABLET (80 MG TOTAL) BY MOUTH DAILY. 90 tablet 1   benzonatate (TESSALON) 100 MG capsule TAKE 1 CAPSULE (100 MG TOTAL) BY MOUTH 2 (TWO) TIMES DAILY AS NEEDED FOR COUGH. 30 capsule 1   cetirizine (ZYRTEC) 10 MG tablet Take 1 tablet (10 mg total) by mouth daily. 30 tablet 1   cholecalciferol (VITAMIN D3) 25 MCG (1000 UNIT) tablet Take 1 tablet (1,000 Units total) by mouth daily. 30 tablet 3   clopidogrel (PLAVIX) 75 MG tablet TAKE 1 TABLET (75 MG TOTAL) BY MOUTH DAILY. 90 tablet 0   Continuous Blood Gluc Sensor (FREESTYLE LIBRE 14 DAY SENSOR) MISC PLACE ONE SENSOR AND USE TO CHECK BLOOD SUGAR FOR 14 DAYS THEN REMOVE AND REPLACE. 2 each 2   famotidine (PEPCID) 20 MG tablet Take 1 tablet (20 mg total) by mouth 2 (two) times daily. 180 tablet 1   fluticasone (FLONASE) 50 MCG/ACT nasal spray PLACE 2 SPRAYS INTO BOTH  NOSTRILS DAILY. 16 g 2   guaiFENesin-dextromethorphan (ROBITUSSIN DM) 100-10 MG/5ML syrup Take 5 mLs by mouth every 6 (six) hours as needed for cough. 118 mL 0   insulin glargine (LANTUS) 100 UNIT/ML Solostar Pen Inject 25 Units into the skin 2 (two) times daily. 30 mL 6   Insulin Pen Needle 31G X 5 MM MISC 1 Act by Does not apply route 4 (four) times daily - after meals and at bedtime. 100 each 11   losartan-hydrochlorothiazide (HYZAAR) 100-25 MG tablet TAKE 1 TABLET BY MOUTH DAILY. 90 tablet 0   metFORMIN (GLUCOPHAGE) 500 MG tablet Take 1 tablet (500 mg total) by  mouth 2 (two) times daily with a meal. 180 tablet 0   Misc. Devices MISC Blood pressure monitor. Dx: Hypertension 1 each 0   No current facility-administered medications on file prior to visit.    ROS: See HPI  Observations/Objective: Today's Vitals   01/19/21 1522  BP: 136/79  Pulse: 83  SpO2: 98%   Awake, alert, oriented x3 Not in acute distress Dysphoric mood    CMP Latest Ref Rng & Units 10/19/2020 01/21/2020 10/21/2019  Glucose 70 - 99 mg/dL 226(H) 78 271(H)  BUN 8 - 27 mg/dL 20 31(H) 19  Creatinine 0.57 - 1.00 mg/dL 1.63(H) 1.74(H) 1.67(H)  Sodium 134 - 144 mmol/L 138 138 139  Potassium 3.5 - 5.2 mmol/L 4.4 4.0 4.3  Chloride 96 - 106 mmol/L 99 100 101  CO2 20 - 29 mmol/L 21 22 23   Calcium 8.7 - 10.3 mg/dL 10.1 10.1 9.6  Total Protein 6.0 - 8.5 g/dL 7.5 7.4 -  Total Bilirubin 0.0 - 1.2 mg/dL 0.3 0.3 -  Alkaline Phos 44 - 121 IU/L 123(H) 99 -  AST 0 - 40 IU/L 12 15 -  ALT 0 - 32 IU/L 14 18 -    Lipid Panel     Component Value Date/Time   CHOL 202 (H) 05/07/2019 1049   TRIG 122 05/07/2019 1049   HDL 46 05/07/2019 1049   CHOLHDL 4.4 05/07/2019 1049   CHOLHDL 2.7 01/04/2016 1138   VLDL 12 01/04/2016 1138   LDLCALC 134 (H) 05/07/2019 1049   LABVLDL 22 05/07/2019 1049    Lab Results  Component Value Date   HGBA1C 10.6 (A) 10/19/2020    Assessment and Plan: 1. Chronic left shoulder pain Possibly adhesive capsulitis Unable to use oral NSAIDS due to the fact that she is on Plavix - diclofenac Sodium (VOLTAREN) 1 % GEL; Apply 4 g topically 4 (four) times daily.  Dispense: 100 g; Refill: 1 - CMP14+EGFR  2. Type 2 diabetes mellitus with other specified complication, with long-term current use of insulin (HCC) Uncontrolled with A1c of 12.9 up from 10.6 She has not been complying with lifestyle modifications - Depression playing a role. Hopefully Psychotherapy will help with motivation Increase Lantus from 25U bid to 30 U bid Follow up with PharmD to titrate  Lantus - insulin glargine (LANTUS) 100 UNIT/ML Solostar Pen; Inject 30 Units into the skin 2 (two) times daily.  Dispense: 30 mL; Refill: 6 - POCT glycosylated hemoglobin (Hb A1C)  3. Stage 3b chronic kidney disease ([place]) Will check renal function and discontinue Metformin if there is a drop  4. 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 Current mild episode of major depressive disorder without prior episode [facility]) This is affecting her medical conditions She declines Pharmacotherapy but would like to try Psychothera[y She has an appointment in 2 days with  a therapist  6. Nausea Possible Gastroparesis Trial of Reglan - metoCLOPramide (REGLAN) 5 MG tablet; Take 1 tablet (5 mg total) by mouth every 8 (eight) hours as needed for nausea.  Dispense: 90 tablet; Refill: 1   Follow Up Instructions: Return in about 6 weeks (around 03/02/2021) for Blood sugar evaluation with [name]; 3 months with PCP.    I discussed the assessment and treatment plan with the patient. The patient was provided an opportunity to ask questions and all were answered. The patient agreed with the plan and demonstrated an understanding of the instructions.   The patient was advised to call back or seek an in-person evaluation if the symptoms worsen or if the condition fails to improve as anticipated.     I provided 22 minutes total of non-face-to-face time during this encounter.   Charlott Rakes, MD, FAAFP. [facility] and [place], [place]   01/19/2021, 3:09 PM

## 2021-01-19 NOTE — Telephone Encounter (Signed)
Met with the patient when she was in the clinic today.  Completed SCAT application and faxed to [employer] [place] Elig[employer].   Patient is requesting application for handicap placard and that has been prepared for Dr Margarita Rana to sign

## 2021-01-20 ENCOUNTER — Ambulatory Visit: Payer: Self-pay | Admitting: *Deleted

## 2021-01-20 DIAGNOSIS — H3411 Central retinal artery occlusion, right eye: Secondary | ICD-10-CM

## 2021-01-20 DIAGNOSIS — E1169 Type 2 diabetes mellitus with other specified complication: Secondary | ICD-10-CM

## 2021-01-20 LAB — CMP14+EGFR
ALT: 14 IU/L (ref 0–32)
AST: 15 IU/L (ref 0–40)
Albumin/Globulin Ratio: 1.8 (ref 1.2–2.2)
Albumin: 4.6 g/dL (ref 3.8–4.8)
Alkaline Phosphatase: 143 IU/L — ABNORMAL HIGH (ref 44–121)
BUN/Creatinine Ratio: 11 — ABNORMAL LOW (ref 12–28)
BUN: 19 mg/dL (ref 8–27)
Bilirubin Total: 0.3 mg/dL (ref 0.0–1.2)
CO2: 21 mmol/L (ref 20–29)
Calcium: 9.8 mg/dL (ref 8.7–10.3)
Chloride: 99 mmol/L (ref 96–106)
Creatinine, Ser: 1.66 mg/dL — ABNORMAL HIGH (ref 0.57–1.00)
Globulin, Total: 2.6 g/dL (ref 1.5–4.5)
Glucose: 383 mg/dL — ABNORMAL HIGH (ref 70–99)
Potassium: 4.5 mmol/L (ref 3.5–5.2)
Sodium: 137 mmol/L (ref 134–144)
Total Protein: 7.2 g/dL (ref 6.0–8.5)
eGFR: 34 mL/min/1.73 — ABNORMAL LOW

## 2021-01-20 NOTE — Chronic Care Management (AMB) (Signed)
Clinical Social Work Note  01/20/2021 Name: Adrienne [name] MRN: [id number] DOB: 1955-11-15  Adrienne [name] is a 66 y.o. year old female who is a primary care patient of Charlott Rakes, MD. The CCM team was consulted to assist the patient with chronic disease management and/or care coordination needs related to: [employer] , Mental Health Counseling and Resources, and vision impairment needs .   Engaged with patient by telephone for follow up visit in response to provider referral for social work chronic care management and care coordination services.   Consent to Services:  The patient was given information about Chronic Care Management services, agreed to services, and gave verbal consent prior to initiation of services.  Please see initial visit note for detailed documentation.   Patient agreed to services and consent obtained.   Assessment: Review of patient past medical history, allergies, medications, and health status, including review of relevant consultants reports was performed today as part of a comprehensive evaluation and provision of chronic care management and care coordination services.     SDOH (Social Determinants of Health) assessments and interventions performed:    Advanced Directives Status: Not addressed in this encounter.  CCM Care Plan  Allergies  Allergen Reactions   Codeine Itching   Hydrocodone Itching   Oxycodone Itching   Penicillins Itching   Clindamycin/Lincomycin Itching   Niacin And Related Itching   Penicillin G Itching   Niacin Itching and Anxiety    Facial flushing    Outpatient Encounter Medications as of 01/20/2021  Medication Sig Note   acetaminophen (TYLENOL) 500 MG tablet Take 500 mg by mouth every 6 (six) hours as needed for moderate pain.    aspirin EC 81 MG tablet Take 81 mg by mouth daily.     atorvastatin (LIPITOR) 80 MG tablet TAKE 1 TABLET (80 MG TOTAL) BY MOUTH DAILY.    benzonatate (TESSALON) 100 MG capsule  TAKE 1 CAPSULE (100 MG TOTAL) BY MOUTH 2 (TWO) TIMES DAILY AS NEEDED FOR COUGH.    cetirizine (ZYRTEC) 10 MG tablet Take 1 tablet (10 mg total) by mouth daily.    cholecalciferol (VITAMIN D3) 25 MCG (1000 UNIT) tablet Take 1 tablet (1,000 Units total) by mouth daily.    clopidogrel (PLAVIX) 75 MG tablet TAKE 1 TABLET (75 MG TOTAL) BY MOUTH DAILY.    Continuous Blood Gluc Sensor (FREESTYLE LIBRE 14 DAY SENSOR) MISC PLACE ONE SENSOR AND USE TO CHECK BLOOD SUGAR FOR 14 DAYS THEN REMOVE AND REPLACE. 04/03/2019: Waiting on new sensor.   diclofenac Sodium (VOLTAREN) 1 % GEL Apply 4 g topically 4 (four) times daily.    famotidine (PEPCID) 20 MG tablet Take 1 tablet (20 mg total) by mouth 2 (two) times daily.    fluticasone (FLONASE) 50 MCG/ACT nasal spray PLACE 2 SPRAYS INTO BOTH NOSTRILS DAILY.    guaiFENesin-dextromethorphan (ROBITUSSIN DM) 100-10 MG/5ML syrup Take 5 mLs by mouth every 6 (six) hours as needed for cough.    insulin glargine (LANTUS) 100 UNIT/ML Solostar Pen Inject 30 Units into the skin 2 (two) times daily.    Insulin Pen Needle 31G X 5 MM MISC 1 Act by Does not apply route 4 (four) times daily - after meals and at bedtime.    losartan-hydrochlorothiazide (HYZAAR) 100-25 MG tablet TAKE 1 TABLET BY MOUTH DAILY.    metFORMIN (GLUCOPHAGE) 500 MG tablet Take 1 tablet (500 mg total) by mouth 2 (two) times daily with a meal.    metoCLOPramide (REGLAN) 5 MG  tablet Take 1 tablet (5 mg total) by mouth every 8 (eight) hours as needed for nausea.    Misc. Devices MISC Blood pressure monitor. Dx: Hypertension    No facility-administered encounter medications on file as of 01/20/2021.    Patient Active Problem List   Diagnosis Date Noted   Pneumonia due to COVID-19 virus 02/15/2019   CKD (chronic kidney disease) stage 3, GFR 30-59 ml/min ([place]) 02/15/2019   CVA (cerebral vascular accident) ([place]) 02/15/2019   Acute respiratory failure with hypoxia ([place]) 02/14/2019   Acute respiratory failure due to  COVID-19 ([place]) 02/14/2019   ARF (acute renal failure) ([place]) 02/14/2019   DM (diabetes mellitus), type 2 with renal complications ([place]) 01/25/3233   GERD (gastroesophageal reflux disease) 01/03/2018   Blindness 10/04/2017   Vitamin D deficiency 08/24/2017   Gastroparesis 05/04/2016   Anemia 10/05/2015   Fall 09/06/2015   DKA (diabetic ketoacidoses) 09/06/2015   Hyperkalemia 09/05/2015   Essential hypertension 12/20/2013   Diabetes mellitus ([place]) 11/19/2013   Other hyperlipidemia 11/19/2013   Central retinal artery occlusion of right eye 11/05/2013    Conditions to be addressed/monitored: Depression; Limited social support and Mental Health Concerns   Care Plan : LCSW Plan of Care  Updates made by Deirdre Peer, LCSW since 01/20/2021 12:00 AM     Problem: Depression/Isolation   Priority: High     Long-Range Goal: Reduce depression symptoms and enhance overall QOL   Start Date: 01/04/2021  Expected End Date: 04/15/2021  This Visit's Progress: On track  Priority: High  Note:   Current barriers:    Financial constraints related to limited income, Limited social support, ADL IADL limitations, Mental Health Concerns , Limited access to caregiver, and Lacks knowledge of community resource:   Clinical Goals: Patient will work with CSW to address needs related to depression and goals for enhancing overall QOL Clinical Interventions:  01/29/21- Spoke with pt who has been linked with a new counselor and she has had 2 sessions and pleased. She feels her depression is better; with the counseling and talking to her Doristine Bosworth has helped tremendously.  Encouraged pt to     previous one left practice. She anticipates their call tomorrow. Pt in good spirits; denies any current concerns related to depression, denies SI/HI. Pt continues to await word from the [employer] in regards to plans for training in [place] to help pt seek employment; which she desires.   Assessment of needs,  barriers , agencies contacted, as well as how impacting  Active listening / Reflection utilized  Emotional Support Provided Participation in counseling encouraged  Increase in actives / exercise encouraged  Discussed referral to [facility] to assist with connecting to mental health provider Review various resources, discussed options and provided patient information about   1:1 collaboration with primary care provider regarding development and update of comprehensive plan of care as evidenced by provider attestation and co-signature Inter-disciplinary care team collaboration (see longitudinal plan of care) Patient Goals/Self-Care Activities: Over the next 45 days Continue with counseling/therapy Continue with compliance of taking medication  Continue with your self-care action plan         Follow Up Plan: SW will follow up with patient by phone over the next 4-6 weeks      [name] MSW, LCSW Licensed Holiday representative

## 2021-01-27 ENCOUNTER — Telehealth: Payer: Self-pay

## 2021-01-27 NOTE — Patient Instructions (Signed)
Visit Information  Thank you for taking time to visit with me today. Please don't hesitate to contact me if I can be of assistance to you before our next scheduled telephone appointment.  [name], MSW, [place] Worker  [place] [phone]

## 2021-01-27 NOTE — Telephone Encounter (Signed)
Pt was called and no VM picked up.  CRM created.

## 2021-01-27 NOTE — Telephone Encounter (Signed)
-----   Message from Charlott Rakes, MD sent at 01/20/2021  8:59 AM EST ----- Please inform her that kidney function is still abnormal but stable compared to previous labs.  One of her liver enzymes is slightly elevated and can occur with fatty deposits in the liver and her recent weight gain could also explain this.  Please advised to continue to work on a low-cholesterol diet.  No regimen change for now but we will continue with diabetes regimen discussed at her visit yesterday.

## 2021-01-27 NOTE — Telephone Encounter (Signed)
Pt. Given results and instructions. Verbalizes understanding. 

## 2021-01-28 ENCOUNTER — Ambulatory Visit: Payer: Medicare Other | Admitting: *Deleted

## 2021-01-29 ENCOUNTER — Other Ambulatory Visit: Payer: Medicare Other | Admitting: *Deleted

## 2021-01-29 NOTE — Patient Outreach (Signed)
[facility]) Care Management  01/29/2021  Adrienne [name] 10/03/1955 [id number]   CSW spoke with pt today who is doing better- says her depression is better/lessened and she has gotten reconnected with a new Counselor (2nd visit).  Pt also feels conversations with her Doristine Bosworth have been a big help. Pt plans for weekly counseling support from TTC/counselor. She is still working and hoping to get things coordinated through [facility] [employer] for vocational training and is also talking with a few other options for a volunteer to read her mail to/for her.  She has a new tenant through mid June and feels good about this also.  CSW advised pt of plans for newly assigned CSW, [name], [place], to continue with care coordination and agrees to follow up in 4-6 weeks.    [name], MSW, [place] Worker  [place] [phone]

## 2021-02-02 ENCOUNTER — Other Ambulatory Visit: Payer: Self-pay

## 2021-02-02 ENCOUNTER — Other Ambulatory Visit: Payer: Self-pay | Admitting: Family Medicine

## 2021-02-02 DIAGNOSIS — E1169 Type 2 diabetes mellitus with other specified complication: Secondary | ICD-10-CM

## 2021-02-02 DIAGNOSIS — Z794 Long term (current) use of insulin: Secondary | ICD-10-CM

## 2021-02-02 NOTE — Telephone Encounter (Signed)
Requested medication (s) are due for refill today: Due 04/01/21  Requested medication (s) are on the active medication list: yes    Last refill: 01/01/21  #90  0 refills  Future visit scheduled yes  Notes to clinic:Failed due to labs. Pt has appt with pharmacist 03/02/21 and with Dr. Margarita Rana 04/20/21. Please review. Thank you.  Requested Prescriptions  Pending Prescriptions Disp Refills   clopidogrel (PLAVIX) 75 MG tablet 90 tablet 0    Sig: TAKE 1 TABLET (75 MG TOTAL) BY MOUTH DAILY.     Hematology: Antiplatelets - clopidogrel Failed - 02/02/2021 11:03 AM      Failed - Evaluate AST, ALT within 2 months of therapy initiation.      Failed - HCT in normal range and within 180 days    Hematocrit  Date Value Ref Range Status  10/19/2020 33.4 (L) 34.0 - 46.6 % Final          Failed - PLT in normal range and within 180 days    Platelets  Date Value Ref Range Status  10/19/2020 508 (H) 150 - 450 x10E3/uL Final          Passed - ALT in normal range and within 360 days    ALT  Date Value Ref Range Status  01/19/2021 14 0 - 32 IU/L Final          Passed - AST in normal range and within 360 days    AST  Date Value Ref Range Status  01/19/2021 15 0 - 40 IU/L Final          Passed - HGB in normal range and within 180 days    Hemoglobin  Date Value Ref Range Status  10/19/2020 11.5 11.1 - 15.9 g/dL Final          Passed - Valid encounter within last 6 months    Recent Outpatient Visits           2 weeks ago Chronic left shoulder pain   [place], [place], MD   3 months ago Type 2 diabetes mellitus with other specified complication, with long-term current use of insulin ([place])   [place], [place], MD   1 year ago Type 2 diabetes mellitus with other specified complication, with long-term current use of insulin ([place])   [place], [place], MD   1 year ago Need  for influenza vaccination   [place], Jarome Matin, RPH-CPP   1 year ago Type 2 diabetes mellitus with other specified complication, with long-term current use of insulin [facility])   [place], [place], MD       Future Appointments             In 4 weeks Daisy Blossom, Jarome Matin, [place]   In 2 months Charlott Rakes, MD [place]

## 2021-02-03 ENCOUNTER — Other Ambulatory Visit: Payer: Self-pay

## 2021-02-03 MED ORDER — CLOPIDOGREL BISULFATE 75 MG PO TABS
ORAL_TABLET | Freq: Every day | ORAL | 0 refills | Status: DC
  Filled 2021-02-03: qty 90, fill #0
  Filled 2021-04-19: qty 90, 90d supply, fill #0

## 2021-02-15 ENCOUNTER — Telehealth: Payer: Self-pay

## 2021-02-15 NOTE — Telephone Encounter (Signed)
SCAT application refaxed to [facility] [place] as requested by [name] Johnson/[facility] [place].  She stated she was missing page 3.

## 2021-02-16 ENCOUNTER — Telehealth: Payer: Self-pay | Admitting: *Deleted

## 2021-02-16 ENCOUNTER — Ambulatory Visit: Payer: Medicare Other | Admitting: *Deleted

## 2021-02-16 NOTE — Patient Outreach (Signed)
°  Care Management   Follow Up Note   02/16/2021 Name: Adrienne [name] MRN: [id number] DOB: 1955/08/02  Referred by: Charlott Rakes, MD  Reason for Referral:  Chronic Care Management Needs in Patient with Blindness, Acute Renal Failure, Stage III Chronic Kidney Disease, Type II Diabetes Mellitus, Acute Respiratory Failure with Hypoxia, and Cerebral Vascular Accident.  An unsuccessful telephone outreach was attempted today. The patient was referred to the case management team for assistance with care management and care coordination. LCSW was unable to leave a HIPAA complaint message on voicemail for patient, as mailbox was full.  LCSW will make a second initial telephone outreach call attempt within the next 5-7 business days, if a return call is not received from patient in the meantime.  Follow-Up Plan: Telephone follow up appointment with care management team member scheduled for:  02/22/2021 at 2:00 pm.  [name], BSW, MSW, [place]  Licensed Clinical Social Worker  [place]  Mailing [place]. [address], [place], [place] [address] Physical Address-[address] [place],  [address] Toll Free Main # [phone] Fax # [phone] Cell # [phone] [email or web address]

## 2021-02-22 ENCOUNTER — Encounter: Payer: Self-pay | Admitting: *Deleted

## 2021-02-22 ENCOUNTER — Telehealth: Payer: Self-pay | Admitting: *Deleted

## 2021-02-22 ENCOUNTER — Ambulatory Visit: Payer: Self-pay | Admitting: *Deleted

## 2021-02-22 NOTE — Patient Outreach (Signed)
°  Care Management   Follow Up Note   02/22/2021 Name: NYELLIE YETTER MRN: [id number] DOB: 05-18-1955  Referred by: Charlott Rakes, MD  Reason for Referral:  Chronic Care Management Needs in Patient with Blindness, Acute Renal Failure, Stage III Chronic Kidney Disease, Type II Diabetes Mellitus, Acute Respiratory Failure with Hypoxia, and Cerebral Vascular Accident.  A second unsuccessful telephone outreach was attempted today. The patient was referred to the case management team for assistance with care management and care coordination. A HIPAA compliant message was left on voicemail, providing contact information, encouraging patient to return LCSW's call at her earliest convenience.  LCSW will make a third initial telephone outreach call within the next 5-7 business days, if a return call is not received from patient in the meantime.  LCSW will also mail an Unsuccessful Outreach Letter to patient's home.  Follow Up Plan: Telephone follow up appointment with care management team member scheduled for:  03/01/2021 at 1:30 pm.  [name], BSW, MSW, [place]  Licensed Clinical Social Worker  [place]  Mailing [place]. [address], [place], [place] [address] Physical Address-[address] [place], [place] [address] Toll Free Main # [phone] Fax # [phone] Cell # [phone] [email or web address]

## 2021-03-01 ENCOUNTER — Encounter: Payer: Self-pay | Admitting: *Deleted

## 2021-03-01 ENCOUNTER — Other Ambulatory Visit: Payer: Self-pay | Admitting: Family Medicine

## 2021-03-01 ENCOUNTER — Other Ambulatory Visit: Payer: Self-pay

## 2021-03-01 ENCOUNTER — Other Ambulatory Visit: Payer: Self-pay | Admitting: *Deleted

## 2021-03-01 NOTE — Patient Outreach (Signed)
Care Management Clinical Social Work Note  03/01/2021 Name: Adrienne [name] MRN: [id number] DOB: Aug 18, 1955  Adrienne [name] is a 66 y.o. year old female who is a primary care patient of Charlott Rakes, MD.  The Care Management team was consulted for assistance with chronic disease management and coordination needs.  Engaged with patient by telephone for initial visit in response to provider referral for social work chronic care management and care coordination services  Consent to Services:  Ms. Travaglini was given information about Care Management services today including:  Care Management services includes personalized support from designated clinical staff supervised by her physician, including individualized plan of care and coordination with other care providers 24/7 contact phone numbers for assistance for urgent and routine care needs. The patient may stop case management services at any time by phone call to the office staff.  Patient agreed to services and consent obtained.   Assessment: Review of patient past medical history, allergies, medications, and health status, including review of relevant consultants reports was performed today as part of a comprehensive evaluation and provision of chronic care management and care coordination services.  SDOH (Social Determinants of Health) assessments and interventions performed:  SDOH Interventions    Flowsheet Row Most Recent Value  SDOH Interventions   Food Insecurity Interventions Intervention Not Indicated  Financial Strain Interventions Intervention Not Indicated  Housing Interventions Intervention Not Indicated  Intimate Partner Violence Interventions Intervention Not Indicated  Physical Activity Interventions Patient Refused  Stress Interventions Intervention Not Indicated, Offered Community Wellness Resources  Social Connections Interventions Intervention Not Indicated  Transportation Interventions Intervention Not  Indicated        Advanced Directives Status: Not addressed in this encounter.  Care Plan  Allergies  Allergen Reactions   Codeine Itching   Hydrocodone Itching   Oxycodone Itching   Penicillins Itching   Clindamycin/Lincomycin Itching   Niacin And Related Itching   Penicillin G Itching   Niacin Itching and Anxiety    Facial flushing    Outpatient Encounter Medications as of 03/01/2021  Medication Sig Note   acetaminophen (TYLENOL) 500 MG tablet Take 500 mg by mouth every 6 (six) hours as needed for moderate pain.    aspirin EC 81 MG tablet Take 81 mg by mouth daily.     atorvastatin (LIPITOR) 80 MG tablet TAKE 1 TABLET (80 MG TOTAL) BY MOUTH DAILY.    cetirizine (ZYRTEC) 10 MG tablet Take 1 tablet (10 mg total) by mouth daily.    cholecalciferol (VITAMIN D3) 25 MCG (1000 UNIT) tablet Take 1 tablet (1,000 Units total) by mouth daily.    clopidogrel (PLAVIX) 75 MG tablet TAKE 1 TABLET (75 MG TOTAL) BY MOUTH DAILY.    Continuous Blood Gluc Sensor (FREESTYLE LIBRE 14 DAY SENSOR) MISC PLACE ONE SENSOR AND USE TO CHECK BLOOD SUGAR FOR 14 DAYS THEN REMOVE AND REPLACE. 04/03/2019: Waiting on new sensor.   diclofenac Sodium (VOLTAREN) 1 % GEL Apply 4 g topically 4 (four) times daily.    famotidine (PEPCID) 20 MG tablet Take 1 tablet (20 mg total) by mouth 2 (two) times daily.    fluticasone (FLONASE) 50 MCG/ACT nasal spray PLACE 2 SPRAYS INTO BOTH NOSTRILS DAILY.    guaiFENesin-dextromethorphan (ROBITUSSIN DM) 100-10 MG/5ML syrup Take 5 mLs by mouth every 6 (six) hours as needed for cough.    insulin glargine (LANTUS) 100 UNIT/ML Solostar Pen Inject 30 Units into the skin 2 (two) times daily.    Insulin Pen Needle 31G X 5  MM MISC 1 Act by Does not apply route 4 (four) times daily - after meals and at bedtime.    losartan-hydrochlorothiazide (HYZAAR) 100-25 MG tablet TAKE 1 TABLET BY MOUTH DAILY.    metFORMIN (GLUCOPHAGE) 500 MG tablet Take 1 tablet (500 mg total) by mouth 2 (two) times  daily with a meal.    metoCLOPramide (REGLAN) 5 MG tablet Take 1 tablet (5 mg total) by mouth every 8 (eight) hours as needed for nausea.    Misc. Devices MISC Blood pressure monitor. Dx: Hypertension    No facility-administered encounter medications on file as of 03/01/2021.    Patient Active Problem List   Diagnosis Date Noted   Pneumonia due to COVID-19 virus 02/15/2019   CKD (chronic kidney disease) stage 3, GFR 30-59 ml/min ([place]) 02/15/2019   CVA (cerebral vascular accident) ([place]) 02/15/2019   Acute respiratory failure with hypoxia ([place]) 02/14/2019   Acute respiratory failure due to COVID-19 ([place]) 02/14/2019   ARF (acute renal failure) ([place]) 02/14/2019   DM (diabetes mellitus), type 2 with renal complications ([place]) 99/37/1696   GERD (gastroesophageal reflux disease) 01/03/2018   Blindness 10/04/2017   Vitamin D deficiency 08/24/2017   Gastroparesis 05/04/2016   Anemia 10/05/2015   Fall 09/06/2015   DKA (diabetic ketoacidoses) 09/06/2015   Hyperkalemia 09/05/2015   Essential hypertension 12/20/2013   Diabetes mellitus ([place]) 11/19/2013   Other hyperlipidemia 11/19/2013   Central retinal artery occlusion of right eye 11/05/2013    Conditions to be addressed/monitored: DMII and Depression.  Film/video editor, Limited Social Support, Transportation Barriers, Level of Care Concerns, Limited Access to Caregiver, Social Isolation, and Lacks Knowledge of [employer].  Care Plan : LCSW Plan of Care  Updates made by Francis Gaines, LCSW since 03/01/2021 12:00 AM     Problem: Reduce and Manage Symptoms of Depression and Enhance Quality of Life.   Priority: High     Long-Range Goal: Reduce and Manage Symptoms of Depression and Enhance Quality of Life.   Start Date: 01/04/2021  Expected End Date: 04/15/2021  This Visit's Progress: On track  Recent Progress: On track  Priority: High  Note:   Current Barriers:   Acute Mental Health Needs related to Blindness, Acute  Renal Failure, Stage III Chronic Kidney Disease, Type II Diabetes Mellitus, Acute Respiratory Failure with Hypoxia, Depressive Symptoms, and Cerebral Vascular Accident, requires Support, Education, Resources, Referrals, Advocacy, and Care Coordination, in order to meet Unmet Acute Mental Health Needs. Financial Constraints related to Limited Income. Limited Social and Family Support, and Limited Access to Caregiver. ADL/IADL Limitations, and Inability to Consistently Administer Prescription Medications. Clinical Goal(s):  Patient will work with LCSW, in an effort to reduce and manage symptoms of Depression, until well-established with a community mental health provider.       Patient will increase knowledge and/or ability of:        Coping Skills, Healthy Habits, Self-Management Skills, Stress Reduction, Home Safety, and Utilizing [employer] and Resources.   Interventions: Collaboration with Primary Care Physician, Dr. Charlott Rakes regarding development and update of comprehensive plan of care as evidenced by provider attestation and co-signature. Inter-disciplinary care team collaboration (see longitudinal plan of care). Clinical Interventions:  Assessed patient's previous treatment, needs, coping skills, current treatment, support system, and barriers to care. PHQ-2 and PHQ-9 Depression Screening Tool performed and results reviewed with patient. Mindfulness Meditation Strategies, Relaxation Techniques, and Deep Breathing Exercises taught and encouraged, daily. Solution-Focused Therapy performed. Verbalization of Feelings encouraged. Emotional Support provided. Problem Solving Solutions  developed. Cognitive Behavioral Therapy initiated. Quality of Sleep assessed and Sleep Hygiene Techniques promoted. Support Group Participation encouraged.      Discussed plans with patient for ongoing care management follow-up and provided patient with direct contact information for care management  team. Discussed several options for long-term counseling based on need and insurance through [employer].   Patient Goals/Self-Care Activities: Begin personal counseling with LCSW, on a bi-weekly basis, to reduce and manage symptoms of Depression, until well-established with a community mental health provider.   Continue to receive virtual (TTC) Timed and Targeted Counseling sessions.  LCSW collaboration with representative from [employer] (# [phone]), to inquire about available services and resources. LCSW collaboration with representative from [employer] ([phone]), to inquire about enrollment, as well as to discuss services offered. Incorporate into daily practice - relaxation techniques, deep breathing exercises, and mindfulness meditation strategies. Review brochure and consider participating in activities of interest, offered at the [facility] [phone]). Review brochure and consider participating in activities of interest, offered through [place] [phone]). Consider self-enrollment in a support group, from the list provided. Continue to utilize [employer] [facility]), as well as [employer] [phone]), for any [facility] affiliated physician appointments and/or provider practice visits. Continue to receive prepared meal delivery services through [employer], with [employer] of [place] [phone]). Contact LCSW directly (# [phone]) if you have questions, need assistance, or if additional social work needs are identified between now and our next scheduled telephone outreach call.  Follow-Up:  03/11/2021 at 9:15 am      [name], BSW, MSW, [place]  Licensed Clinical Social Worker  [place]  Mailing [address], [place], [place]  [address] Physical Address-[address], [place], [place] [address] Toll Free Main # [phone] Fax # [phone] Cell # [phone] [email or web address]

## 2021-03-02 ENCOUNTER — Other Ambulatory Visit: Payer: Self-pay

## 2021-03-02 ENCOUNTER — Ambulatory Visit: Payer: Medicare Other | Admitting: Pharmacist

## 2021-03-02 DIAGNOSIS — Z794 Long term (current) use of insulin: Secondary | ICD-10-CM | POA: Diagnosis not present

## 2021-03-02 DIAGNOSIS — E119 Type 2 diabetes mellitus without complications: Secondary | ICD-10-CM | POA: Diagnosis not present

## 2021-03-02 MED ORDER — INSULIN PEN NEEDLE 31G X 5 MM MISC
1.0000 | Freq: Three times a day (TID) | 3 refills | Status: DC
  Filled 2021-03-02: qty 100, 25d supply, fill #0
  Filled 2021-05-10: qty 100, 25d supply, fill #1

## 2021-03-02 NOTE — Telephone Encounter (Signed)
Requested medication (s) are due for refill today:   Yes  Requested medication (s) are on the active medication list:   Yes  Future visit scheduled:   Yes   Last ordered: 12/28/2018  #100, 11 refills  Returned because needs new rx.   Requested Prescriptions  Pending Prescriptions Disp Refills   Insulin Pen Needle 31G X 5 MM MISC 100 each 11    Sig: 1 Act by Does not apply route 4 (four) times daily - after meals and at bedtime.     Endocrinology: Diabetes - Testing Supplies Passed - 03/01/2021 10:07 AM      Passed - Valid encounter within last 12 months    Recent Outpatient Visits           1 month ago Chronic left shoulder pain   [place], [place], MD   4 months ago Type 2 diabetes mellitus with other specified complication, with long-term current use of insulin ([place])   [place], Charlane Ferretti, MD   1 year ago Type 2 diabetes mellitus with other specified complication, with long-term current use of insulin ([place])   [place] Charlott Rakes, MD   1 year ago Need for influenza vaccination   [place], Jarome Matin, RPH-CPP   1 year ago Type 2 diabetes mellitus with other specified complication, with long-term current use of insulin ([place])   [place], [place], MD       Future Appointments             In 1 month Charlott Rakes, MD [place]

## 2021-03-03 ENCOUNTER — Other Ambulatory Visit: Payer: Self-pay

## 2021-03-11 ENCOUNTER — Other Ambulatory Visit: Payer: Self-pay | Admitting: *Deleted

## 2021-03-11 NOTE — Patient Outreach (Signed)
Care Management Clinical Social Work Note  03/11/2021 Name: Adrienne [name] MRN: [id number] DOB: [birth year]  Adrienne [name] is a 66 y.o. year old female who is a primary care patient of Adrienne Rakes, MD.  The Care Management team was consulted for assistance with chronic disease management and coordination needs.  Engaged with patient by telephone for follow up visit in response to provider referral for social work chronic care management and care coordination services  Consent to Services:  Adrienne [name] was given information about Care Management services today including:  Care Management services includes personalized support from designated clinical staff supervised by her physician, including individualized plan of care and coordination with other care providers 24/7 contact phone numbers for assistance for urgent and routine care needs. The patient may stop case management services at any time by phone call to the office staff.  Patient agreed to services and consent obtained.   Assessment: Review of patient past medical history, allergies, medications, and health status, including review of relevant consultants reports was performed today as part of a comprehensive evaluation and provision of chronic care management and care coordination services.  SDOH (Social Determinants of Health) assessments and interventions performed:    Advanced Directives Status: Not addressed in this encounter.  Care Plan  Allergies  Allergen Reactions   Codeine Itching   Hydrocodone Itching   Oxycodone Itching   Penicillins Itching   Clindamycin/Lincomycin Itching   Niacin And Related Itching   Penicillin G Itching   Niacin Itching and Anxiety    Facial flushing    Outpatient Encounter Medications as of 03/11/2021  Medication Sig Note   acetaminophen (TYLENOL) 500 MG tablet Take 500 mg by mouth every 6 (six) hours as needed for moderate pain.    aspirin EC 81 MG tablet Take 81 mg by  mouth daily.     atorvastatin (LIPITOR) 80 MG tablet TAKE 1 TABLET (80 MG TOTAL) BY MOUTH DAILY.    cetirizine (ZYRTEC) 10 MG tablet Take 1 tablet (10 mg total) by mouth daily.    cholecalciferol (VITAMIN D3) 25 MCG (1000 UNIT) tablet Take 1 tablet (1,000 Units total) by mouth daily.    clopidogrel (PLAVIX) 75 MG tablet TAKE 1 TABLET (75 MG TOTAL) BY MOUTH DAILY.    Continuous Blood Gluc Sensor (FREESTYLE LIBRE 14 DAY SENSOR) MISC PLACE ONE SENSOR AND USE TO CHECK BLOOD SUGAR FOR 14 DAYS THEN REMOVE AND REPLACE. 04/03/2019: Waiting on new sensor.   diclofenac Sodium (VOLTAREN) 1 % GEL Apply 4 g topically 4 (four) times daily.    famotidine (PEPCID) 20 MG tablet Take 1 tablet (20 mg total) by mouth 2 (two) times daily.    fluticasone (FLONASE) 50 MCG/ACT nasal spray PLACE 2 SPRAYS INTO BOTH NOSTRILS DAILY.    guaiFENesin-dextromethorphan (ROBITUSSIN DM) 100-10 MG/5ML syrup Take 5 mLs by mouth every 6 (six) hours as needed for cough.    insulin glargine (LANTUS) 100 UNIT/ML Solostar Pen Inject 30 Units into the skin 2 (two) times daily.    Insulin Pen Needle 31G X 5 MM MISC use as directed 4 (four) times daily - after meals and at bedtime.    losartan-hydrochlorothiazide (HYZAAR) 100-25 MG tablet TAKE 1 TABLET BY MOUTH DAILY.    metFORMIN (GLUCOPHAGE) 500 MG tablet Take 1 tablet (500 mg total) by mouth 2 (two) times daily with a meal.    metoCLOPramide (REGLAN) 5 MG tablet Take 1 tablet (5 mg total) by mouth every 8 (eight) hours as needed for  nausea.    Misc. Devices MISC Blood pressure monitor. Dx: Hypertension    No facility-administered encounter medications on file as of 03/11/2021.    Patient Active Problem List   Diagnosis Date Noted   Pneumonia due to COVID-19 virus 02/15/2019   CKD (chronic kidney disease) stage 3, GFR 30-59 ml/min ([place]) 02/15/2019   CVA (cerebral vascular accident) ([place]) 02/15/2019   Acute respiratory failure with hypoxia ([place]) 02/14/2019   Acute respiratory failure  due to COVID-19 ([place]) 02/14/2019   ARF (acute renal failure) ([place]) 02/14/2019   DM (diabetes mellitus), type 2 with renal complications ([place]) 53/29/9242   GERD (gastroesophageal reflux disease) 01/03/2018   Blindness 10/04/2017   Vitamin D deficiency 08/24/2017   Gastroparesis 05/04/2016   Anemia 10/05/2015   Fall 09/06/2015   DKA (diabetic ketoacidoses) 09/06/2015   Hyperkalemia 09/05/2015   Essential hypertension 12/20/2013   Diabetes mellitus ([place]) 11/19/2013   Other hyperlipidemia 11/19/2013   Central retinal artery occlusion of right eye 11/05/2013    Conditions to be addressed/monitored: DMII and Depression.  Limited Social Support, Level of Care Concerns, ADL/IADL Limitations, Mental Health Concerns, Social Isolation, Limited Access to Caregiver, and Lacks Knowledge of [employer].  Care Plan : LCSW Plan of Care  Updates made by Adrienne Gaines, LCSW since 03/11/2021 12:00 AM     Problem: Reduce and Manage Symptoms of Depression and Enhance Quality of Life.   Priority: High     Long-Range Goal: Reduce and Manage Symptoms of Depression and Enhance Quality of Life.   Start Date: 01/04/2021  Expected End Date: 04/15/2021  This Visit's Progress: On track  Recent Progress: On track  Priority: High  Note:   Current Barriers:   Acute Mental Health Needs related to Blindness, Acute Renal Failure, Stage III Chronic Kidney Disease, Type II Diabetes Mellitus, Acute Respiratory Failure with Hypoxia, Depressive Symptoms, and Cerebral Vascular Accident, requires Support, Education, Resources, Referrals, Advocacy, and Care Coordination, in order to meet Unmet Acute Mental Health Needs. Financial Constraints related to Limited Income. Limited Social and Family Support, and Limited Access to Caregiver. ADL/IADL Limitations, and Inability to Consistently Administer Prescription Medications. Clinical Goal(s):  Patient will work with LCSW, in an effort to reduce and manage  symptoms of Depression, until well-established with a community mental health provider.       Patient will increase knowledge and/or ability of:        Coping Skills, Healthy Habits, Self-Management Skills, Stress Reduction, Home Safety, and Utilizing [employer] and Resources.   Interventions: Collaboration with Primary Care Physician, Dr. Charlott [name] regarding development and update of comprehensive plan of care as evidenced by provider attestation and co-signature. Inter-disciplinary care team collaboration (see longitudinal plan of care). Clinical Interventions:  Mindfulness Meditation Strategies, Relaxation Techniques, and Deep Breathing Exercises reviewed and encouraged, daily. Solution-Focused Therapy performed. Problem Solving Solutions developed. Cognitive Behavioral Therapy initiated. Support Group Participation encouraged.      Patient Goals/Self-Care Activities: Continue to receive personal counseling with LCSW, on a bi-weekly basis, to reduce and manage symptoms of Depression, until well-established with (TTC) Timed and Targeted Counseling Services.   Continue to receive weekly virtual (TTC) Timed and Targeted Counseling sessions, in an effort to reduce and manage symptoms of Depression.  LCSW collaboration with representative from [employer] (# [phone]), to inquire about available services and resources.  ~ HIPAA compliant messages left on voicemail, as LCSW continues to make outreach attempts and await a return call. LCSW collaboration with representative from [facility] [phone]), to inquire about enrollment, as well as to discuss services offered.  ~ HIPAA compliant messages left on voicemail, as LCSW continues to make outreach attempts and await a return call. Continue to incorporate into daily practice - relaxation techniques, deep breathing exercises, and mindfulness meditation strategies. Receive assistance in  reviewing brochures for [facility] [phone]), and [facility] the [place] [phone]), and consider self-enrollment in activities of interest. LCSW collaboration with Bary Castilla, Assistant Clinical Director with [place] Management, to explore community agencies and resources that may be able to assist with weekly home visits, to review mail, pay bills, complete and submit applications for public and financial assistance, perform light housekeeping duties, etc.  ~ Extensive search started on 03/11/2021. ~ HIPAA compliant message left on voicemail for Home Care Providers, to see if they are able to accommodate needs and requests, at the expense of [place] Management.  LCSW continues to await a return call. Continue to utilize [employer]) for all transportation needs.   Continue to receive prepared meal delivery services through [employer], with [employer] of [place] [phone]), Monday through Friday. Contact LCSW directly (# [phone]), if you have questions, need assistance, or if additional social work needs are identified between now and our next scheduled telephone outreach call.  Follow-Up:  03/18/2021 at 1:45 pm    [name], BSW, MSW, [place]  Licensed Clinical Social Worker  [place]  Mailing [place]. [address], [place], [place] [address] Physical Address[address], [place], [place] [address] Toll Free Main # [phone] Fax # [phone] Cell # [phone] [email or web address]

## 2021-03-18 ENCOUNTER — Other Ambulatory Visit: Payer: Self-pay | Admitting: *Deleted

## 2021-03-19 ENCOUNTER — Other Ambulatory Visit (HOSPITAL_COMMUNITY): Payer: Self-pay

## 2021-03-19 ENCOUNTER — Other Ambulatory Visit: Payer: Self-pay

## 2021-03-20 NOTE — Patient Outreach (Signed)
Care Management Clinical Social Work Note  03/20/2021 Name: Adrienne [name] MRN: [id number] DOB: [birth year]  Adrienne [name] is a 66 y.o. year old female who is a primary care patient of Charlott Rakes, MD.  The Care Management team was consulted for assistance with chronic disease management and coordination needs.  Engaged with patient by telephone for follow up visit in response to provider referral for social work chronic care management and care coordination services  Consent to Services:  Ms. Derossett was given information about Care Management services today including:  Care Management services includes personalized support from designated clinical staff supervised by her physician, including individualized plan of care and coordination with other care providers 24/7 contact phone numbers for assistance for urgent and routine care needs. The patient may stop case management services at any time by phone call to the office staff.  Patient agreed to services and consent obtained.   Assessment: Review of patient past medical history, allergies, medications, and health status, including review of relevant consultants reports was performed today as part of a comprehensive evaluation and provision of chronic care management and care coordination services.  SDOH (Social Determinants of Health) assessments and interventions performed:    Advanced Directives Status: Not addressed in this encounter.  Care Plan  Allergies  Allergen Reactions   Codeine Itching   Hydrocodone Itching   Oxycodone Itching   Penicillins Itching   Clindamycin/Lincomycin Itching   Niacin And Related Itching   Penicillin G Itching   Niacin Itching and Anxiety    Facial flushing    Outpatient Encounter Medications as of 03/18/2021  Medication Sig Note   acetaminophen (TYLENOL) 500 MG tablet Take 500 mg by mouth every 6 (six) hours as needed for moderate pain.    aspirin EC 81 MG tablet Take 81 mg by  mouth daily.     atorvastatin (LIPITOR) 80 MG tablet TAKE 1 TABLET (80 MG TOTAL) BY MOUTH DAILY.    cetirizine (ZYRTEC) 10 MG tablet Take 1 tablet (10 mg total) by mouth daily.    cholecalciferol (VITAMIN D3) 25 MCG (1000 UNIT) tablet Take 1 tablet (1,000 Units total) by mouth daily.    clopidogrel (PLAVIX) 75 MG tablet TAKE 1 TABLET (75 MG TOTAL) BY MOUTH DAILY.    Continuous Blood Gluc Sensor (FREESTYLE LIBRE 14 DAY SENSOR) MISC PLACE ONE SENSOR AND USE TO CHECK BLOOD SUGAR FOR 14 DAYS THEN REMOVE AND REPLACE. 04/03/2019: Waiting on new sensor.   diclofenac Sodium (VOLTAREN) 1 % GEL Apply 4 g topically 4 (four) times daily.    famotidine (PEPCID) 20 MG tablet Take 1 tablet (20 mg total) by mouth 2 (two) times daily.    fluticasone (FLONASE) 50 MCG/ACT nasal spray PLACE 2 SPRAYS INTO BOTH NOSTRILS DAILY.    guaiFENesin-dextromethorphan (ROBITUSSIN DM) 100-10 MG/5ML syrup Take 5 mLs by mouth every 6 (six) hours as needed for cough.    insulin glargine (LANTUS) 100 UNIT/ML Solostar Pen Inject 30 Units into the skin 2 (two) times daily.    Insulin Pen Needle 31G X 5 MM MISC use as directed 4 (four) times daily - after meals and at bedtime.    losartan-hydrochlorothiazide (HYZAAR) 100-25 MG tablet TAKE 1 TABLET BY MOUTH DAILY.    metFORMIN (GLUCOPHAGE) 500 MG tablet Take 1 tablet (500 mg total) by mouth 2 (two) times daily with a meal.    metoCLOPramide (REGLAN) 5 MG tablet Take 1 tablet (5 mg total) by mouth every 8 (eight) hours as needed for  nausea.    Misc. Devices MISC Blood pressure monitor. Dx: Hypertension    No facility-administered encounter medications on file as of 03/18/2021.    Patient Active Problem List   Diagnosis Date Noted   Pneumonia due to COVID-19 virus 02/15/2019   CKD (chronic kidney disease) stage 3, GFR 30-59 ml/min ([facility]) 02/15/2019   CVA (cerebral vascular accident) ([place]) 02/15/2019   Acute respiratory failure with hypoxia ([facility]) 02/14/2019   Acute respiratory failure  due to COVID-19 ([place]) 02/14/2019   ARF (acute renal failure) ([facility]) 02/14/2019   DM (diabetes mellitus), type 2 with renal complications ([facility]) 77/82/4235   GERD (gastroesophageal reflux disease) 01/03/2018   Blindness 10/04/2017   Vitamin D deficiency 08/24/2017   Gastroparesis 05/04/2016   Anemia 10/05/2015   Fall 09/06/2015   DKA (diabetic ketoacidoses) 09/06/2015   Hyperkalemia 09/05/2015   Essential hypertension 12/20/2013   Diabetes mellitus ([facility]) 11/19/2013   Other hyperlipidemia 11/19/2013   Central retinal artery occlusion of right eye 11/05/2013    Conditions to be addressed/monitored: HTN and DMII.  Limited Social Support, Level of Care Concerns, ADL/IADL Limitations, Mental Health Concerns, Social Isolation, Limited Access to Caregiver, and Lacks Knowledge of [employer].  Care Plan : LCSW Plan of Care  Updates made by Francis Gaines, LCSW since 03/20/2021 12:00 AM     Problem: Reduce and Manage Symptoms of Depression and Enhance Quality of Life.   Priority: High     Long-Range Goal: Reduce and Manage Symptoms of Depression and Enhance Quality of Life.   Start Date: 01/04/2021  Expected End Date: 04/15/2021  This Visit's Progress: On track  Recent Progress: On track  Priority: High  Note:   Current Barriers:   Acute Mental Health Needs related to Blindness, Acute Renal Failure, Stage III Chronic Kidney Disease, Type II Diabetes Mellitus, Acute Respiratory Failure with Hypoxia, Depressive Symptoms, and Cerebral Vascular Accident, requires Support, Education, Resources, Referrals, Advocacy, and Care Coordination, in order to meet Unmet Acute Mental Health Needs. Financial Constraints related to Limited Income. Limited Social and Family Support, and Limited Access to Caregiver. ADL/IADL Limitations, and Inability to Consistently Administer Prescription Medications. Clinical Goal(s):  Patient will work with LCSW, in an effort to reduce and manage symptoms of  Depression, until well-established with a community mental health provider.       Patient will increase knowledge and/or ability of:        Coping Skills, Healthy Habits, Self-Management Skills, Stress Reduction, Home Safety, and Utilizing [employer] and Resources.   Interventions: Collaboration with Primary Care Physician, Dr. Charlott Rakes regarding development and update of comprehensive plan of care as evidenced by provider attestation and co-signature. Inter-disciplinary care team collaboration (see longitudinal plan of care). Clinical Interventions:  Mindfulness Meditation Strategies, Relaxation Techniques, and Deep Breathing Exercises reviewed and encouraged, daily. Solution-Focused Therapy performed. Problem Solving Solutions developed. Cognitive Behavioral Therapy initiated. Support Group Participation encouraged.      Patient Goals/Self-Care Activities: Continue to receive personal counseling with LCSW, on a bi-weekly basis, to reduce and manage symptoms of Depression, until well-established with (TTC) Timed and Targeted Counseling Services.   Continue to receive weekly virtual (TTC) Timed and Targeted Counseling sessions, in an effort to reduce and manage symptoms of Depression.  LCSW collaboration with representative from [employer] [facility] (# [phone]), to inquire about available services and resources.  ~ HIPAA compliant messages left on voicemail.  LCSW continues to make outreach attempts and await a return call. LCSW collaboration with representative from [facility] [phone]), to inquire about enrollment, as well as to discuss services offered.  ~ HIPAA compliant messages left on voicemail.  LCSW continues to make outreach attempts and await a return call. Approval obtained from Bary Castilla, Scientist, research (life sciences) with [facility] [employer], to utilize [facility] [employer], to  perform weekly home visits, review mail, pay bills, complete and submit applications for public and financial assistance, perform light housekeeping duties, etc. LCSW collaboration with Oren Section with [facility] [phone]), to place referral for personal care services. ~ HIPAA compliant messages left on voicemail.  LCSW continues to make outreach attempts and await a return call. Contact LCSW directly (# [phone]), if you have questions, need assistance, or if additional social work needs are identified between now and our next scheduled telephone outreach call.  Follow-Up:  04/01/2021 at 2:30 pm   [name], BSW, MSW, [place]  Licensed Clinical Social Worker  [place]  Mailing [place] [address], [place], [place] [address] Physical Address-[address], [place], [place] [address] Toll Free Main # [phone] Fax # [phone] Cell # [phone] [email or web address]

## 2021-04-01 ENCOUNTER — Other Ambulatory Visit: Payer: Self-pay | Admitting: *Deleted

## 2021-04-01 ENCOUNTER — Telehealth: Payer: Self-pay

## 2021-04-01 NOTE — Telephone Encounter (Signed)
I spoke to [employer], [employer] who confirmed that she received the application but still needs to process it .  ?

## 2021-04-02 DIAGNOSIS — Z794 Long term (current) use of insulin: Secondary | ICD-10-CM | POA: Diagnosis not present

## 2021-04-02 DIAGNOSIS — E119 Type 2 diabetes mellitus without complications: Secondary | ICD-10-CM | POA: Diagnosis not present

## 2021-04-02 NOTE — Patient Outreach (Signed)
Care Management Clinical Social Work Note  04/02/2021 Name: Adrienne [name] MRN: [id number] DOB: 1955-05-09  Adrienne [name] is a 66 y.o. year old female who is a primary care patient of Hoy Register, MD.  The Care Management team was consulted for assistance with chronic disease management and coordination needs.  Engaged with patient by telephone for follow up visit in response to provider referral for social work chronic care management and care coordination services  Consent to Services:  [name] was given information about Care Management services today including:  Care Management services includes personalized support from designated clinical staff supervised by her physician, including individualized plan of care and coordination with other care providers 24/7 contact phone numbers for assistance for urgent and routine care needs. The patient may stop case management services at any time by phone call to the office staff.  Patient agreed to services and consent obtained.   Assessment: Review of patient past medical history, allergies, medications, and health status, including review of relevant consultants reports was performed today as part of a comprehensive evaluation and provision of chronic care management and care coordination services.  SDOH (Social Determinants of Health) assessments and interventions performed:    Advanced Directives Status: Not addressed in this encounter.  Care Plan  Allergies  Allergen Reactions   Codeine Itching   Hydrocodone Itching   Oxycodone Itching   Penicillins Itching   Clindamycin/Lincomycin Itching   Niacin And Related Itching   Penicillin G Itching   Niacin Itching and Anxiety    Facial flushing    Outpatient Encounter Medications as of 04/01/2021  Medication Sig Note   acetaminophen (TYLENOL) 500 MG tablet Take 500 mg by mouth every 6 (six) hours as needed for moderate pain.    aspirin EC 81 MG tablet Take 81 mg by  mouth daily.     atorvastatin (LIPITOR) 80 MG tablet TAKE 1 TABLET (80 MG TOTAL) BY MOUTH DAILY.    cetirizine (ZYRTEC) 10 MG tablet Take 1 tablet (10 mg total) by mouth daily.    cholecalciferol (VITAMIN D3) 25 MCG (1000 UNIT) tablet Take 1 tablet (1,000 Units total) by mouth daily.    clopidogrel (PLAVIX) 75 MG tablet TAKE 1 TABLET (75 MG TOTAL) BY MOUTH DAILY.    Continuous Blood Gluc Sensor (FREESTYLE LIBRE 14 DAY SENSOR) MISC PLACE ONE SENSOR AND USE TO CHECK BLOOD SUGAR FOR 14 DAYS THEN REMOVE AND REPLACE. 04/03/2019: Waiting on new sensor.   diclofenac Sodium (VOLTAREN) 1 % GEL Apply 4 g topically 4 (four) times daily.    famotidine (PEPCID) 20 MG tablet Take 1 tablet (20 mg total) by mouth 2 (two) times daily.    fluticasone (FLONASE) 50 MCG/ACT nasal spray PLACE 2 SPRAYS INTO BOTH NOSTRILS DAILY.    guaiFENesin-dextromethorphan (ROBITUSSIN DM) 100-10 MG/5ML syrup Take 5 mLs by mouth every 6 (six) hours as needed for cough.    insulin glargine (LANTUS) 100 UNIT/ML Solostar Pen Inject 30 Units into the skin 2 (two) times daily.    Insulin Pen Needle 31G X 5 MM MISC use as directed 4 (four) times daily - after meals and at bedtime.    losartan-hydrochlorothiazide (HYZAAR) 100-25 MG tablet TAKE 1 TABLET BY MOUTH DAILY.    metFORMIN (GLUCOPHAGE) 500 MG tablet Take 1 tablet (500 mg total) by mouth 2 (two) times daily with a meal.    metoCLOPramide (REGLAN) 5 MG tablet Take 1 tablet (5 mg total) by mouth every 8 (eight) hours as needed for  nausea.    Misc. Devices MISC Blood pressure monitor. Dx: Hypertension    No facility-administered encounter medications on file as of 04/01/2021.    Patient Active Problem List   Diagnosis Date Noted   Pneumonia due to COVID-19 virus 02/15/2019   CKD (chronic kidney disease) stage 3, GFR 30-59 ml/min ([facility]) 02/15/2019   CVA (cerebral vascular accident) ([facility]) 02/15/2019   Acute respiratory failure with hypoxia ([facility]) 02/14/2019   Acute respiratory failure  due to COVID-19 ([facility]) 02/14/2019   ARF (acute renal failure) ([facility]) 02/14/2019   DM (diabetes mellitus), type 2 with renal complications ([facility]) 02/14/2019   GERD (gastroesophageal reflux disease) 01/03/2018   Blindness 10/04/2017   Vitamin D deficiency 08/24/2017   Gastroparesis 05/04/2016   Anemia 10/05/2015   Fall 09/06/2015   DKA (diabetic ketoacidoses) 09/06/2015   Hyperkalemia 09/05/2015   Essential hypertension 12/20/2013   Diabetes mellitus ([facility]) 11/19/2013   Other hyperlipidemia 11/19/2013   Central retinal artery occlusion of right eye 11/05/2013    Conditions to be addressed/monitored: Depression and Legally Blind.  Limited Social Support, Mental Health Concerns, Social Isolation, Limited Access to Caregiver, and Lacks Knowledge of [employer].  Care Plan : LCSW Plan of Care  Updates made by [name], LCSW since 04/02/2021 12:00 AM     Problem: Reduce and Manage Symptoms of Depression and Enhance Quality of Life.   Priority: High     Long-Range Goal: Reduce and Manage Symptoms of Depression and Enhance Quality of Life.   Start Date: 01/04/2021  Expected End Date: 04/15/2021  This Visit's Progress: On track  Recent Progress: On track  Priority: High  Note:   Current Barriers:   Acute Mental Health Needs related to Blindness, Acute Renal Failure, Stage III Chronic Kidney Disease, Type II Diabetes Mellitus, Acute Respiratory Failure with Hypoxia, Depressive Symptoms, and Cerebral Vascular Accident, requires Support, Education, Resources, Referrals, Advocacy, and Care Coordination, in order to meet Unmet Acute Mental Health Needs. Financial Constraints related to Limited Income. Limited Social and Family Support, and Limited Access to Caregiver. ADL/IADL Limitations, and Inability to Consistently Administer Prescription Medications. Clinical Goal(s):  Patient will work with LCSW, in an effort to reduce and manage symptoms of Depression, until  well-established with a community mental health provider.       Patient will increase knowledge and/or ability of:        Coping Skills, Healthy Habits, Self-Management Skills, Stress Reduction, Home Safety, and Utilizing [employer] and Resources.   Interventions: Collaboration with Primary Care Physician, Dr. Hoy Register regarding development and update of comprehensive plan of care as evidenced by provider attestation and co-signature. Inter-disciplinary care team collaboration (see longitudinal plan of care). Clinical Interventions:  Mindfulness Meditation Strategies, Relaxation Techniques, and Deep Breathing Exercises encouraged daily. Solution-Focused Therapy performed. Problem Solving Solutions developed. Cognitive Behavioral Therapy initiated. Support Group Participation encouraged.   Verbalization of Feelings emphasized. Emotional Support provided.   Patient Goals/Self-Care Activities: Continue to receive personal counseling with LCSW, on a bi-weekly basis, to reduce and manage symptoms of Depression, until well-established with (TTC) Timed and Targeted Counseling Services.   Continue to receive weekly virtual (TTC) Timed and Targeted Counseling sessions, in an effort to reduce and manage symptoms of Depression.  LCSW collaboration with representative from [employer] [facility] (# [phone]), to inquire about available services and resources.  ~ HIPAA compliant messages left on voicemail.  LCSW continues to make outreach attempts and await a return call. LCSW collaboration with Jackquline Berlin with [facility] (# [phone]), to place referral for personal care services. ~ HIPAA compliant messages left on voicemail.  LCSW continues to make outreach attempts and await a return call. Contact LCSW directly (# [phone]), if you have questions, need assistance, or if additional social work needs are identified between now and our next scheduled telephone  outreach call.  Follow-Up:  04/15/2021 at 1:30 pm  Danford Bad, BSW, MSW, LCSW  Licensed Clinical Social Worker  [facility]  Mailing [place] [address], [place], [place] [address] Physical Address-[address], [place], [place] [address] Toll Free Main # [phone] Fax # [phone] Cell # [phone] [email or web address]

## 2021-04-05 ENCOUNTER — Other Ambulatory Visit: Payer: Self-pay

## 2021-04-05 ENCOUNTER — Other Ambulatory Visit (HOSPITAL_COMMUNITY): Payer: Self-pay

## 2021-04-15 ENCOUNTER — Ambulatory Visit: Payer: Medicare Other | Admitting: *Deleted

## 2021-04-15 ENCOUNTER — Encounter: Payer: Self-pay | Admitting: *Deleted

## 2021-04-15 ENCOUNTER — Other Ambulatory Visit: Payer: Self-pay | Admitting: *Deleted

## 2021-04-15 NOTE — Patient Outreach (Signed)
Care Management ?Clinical Social Work Note ? ?04/15/2021 ?Name: Adrienne [name] MRN: [id number] DOB: April 17, 1955 ? ?Adrienne [name] is a 66 y.o. year old female who is a primary care patient of Charlott Rakes, MD.  The Care Management team was consulted for assistance with chronic disease management and coordination needs. ? ?Engaged with patient by telephone for follow up visit in response to provider referral for social work chronic care management and care coordination services ? ?Consent to Services:  ?Ms. Radin was given information about Care Management services today including:  ?Care Management services includes personalized support from designated clinical staff supervised by her physician, including individualized plan of care and coordination with other care providers ?24/7 contact phone numbers for assistance for urgent and routine care needs. ?The patient may stop case management services at any time by phone call to the office staff. ? ?Patient agreed to services and consent obtained.  ? ?Assessment: Review of patient past medical history, allergies, medications, and health status, including review of relevant consultants reports was performed today as part of a comprehensive evaluation and provision of chronic care management and care coordination services. ? ?SDOH (Social Determinants of Health) assessments and interventions performed:   ? ?Advanced Directives Status: Not addressed in this encounter. ? ?Care Plan ? ?Allergies  ?Allergen Reactions  ? Codeine Itching  ? Hydrocodone Itching  ? Oxycodone Itching  ? Penicillins Itching  ? Clindamycin/Lincomycin Itching  ? Niacin And Related Itching  ? Penicillin G Itching  ? Niacin Itching and Anxiety  ?  Facial flushing  ? ? ?Outpatient Encounter Medications as of 04/15/2021  ?Medication Sig Note  ? acetaminophen (TYLENOL) 500 MG tablet Take 500 mg by mouth every 6 (six) hours as needed for moderate pain.   ? aspirin EC 81 MG tablet Take 81 mg by  mouth daily.    ? atorvastatin (LIPITOR) 80 MG tablet TAKE 1 TABLET (80 MG TOTAL) BY MOUTH DAILY.   ? cetirizine (ZYRTEC) 10 MG tablet Take 1 tablet (10 mg total) by mouth daily.   ? cholecalciferol (VITAMIN D3) 25 MCG (1000 UNIT) tablet Take 1 tablet (1,000 Units total) by mouth daily.   ? clopidogrel (PLAVIX) 75 MG tablet TAKE 1 TABLET (75 MG TOTAL) BY MOUTH DAILY.   ? Continuous Blood Gluc Sensor (FREESTYLE LIBRE 14 DAY SENSOR) MISC PLACE ONE SENSOR AND USE TO CHECK BLOOD SUGAR FOR 14 DAYS THEN REMOVE AND REPLACE. 04/03/2019: Waiting on new sensor.  ? diclofenac Sodium (VOLTAREN) 1 % GEL Apply 4 g topically 4 (four) times daily.   ? famotidine (PEPCID) 20 MG tablet Take 1 tablet (20 mg total) by mouth 2 (two) times daily.   ? fluticasone (FLONASE) 50 MCG/ACT nasal spray PLACE 2 SPRAYS INTO BOTH NOSTRILS DAILY.   ? guaiFENesin-dextromethorphan (ROBITUSSIN DM) 100-10 MG/5ML syrup Take 5 mLs by mouth every 6 (six) hours as needed for cough.   ? insulin glargine (LANTUS) 100 UNIT/ML Solostar Pen Inject 30 Units into the skin 2 (two) times daily.   ? Insulin Pen Needle 31G X 5 MM MISC use as directed 4 (four) times daily - after meals and at bedtime.   ? losartan-hydrochlorothiazide (HYZAAR) 100-25 MG tablet TAKE 1 TABLET BY MOUTH DAILY.   ? metFORMIN (GLUCOPHAGE) 500 MG tablet Take 1 tablet (500 mg total) by mouth 2 (two) times daily with a meal.   ? metoCLOPramide (REGLAN) 5 MG tablet Take 1 tablet (5 mg total) by mouth every 8 (eight) hours as needed for  nausea.   ? Misc. Devices MISC Blood pressure monitor. Dx: Hypertension   ? ?No facility-administered encounter medications on file as of 04/15/2021.  ? ? ?Patient Active Problem List  ? Diagnosis Date Noted  ? Pneumonia due to COVID-19 virus 02/15/2019  ? CKD (chronic kidney disease) stage 3, GFR 30-59 ml/min (HCC) 02/15/2019  ? CVA (cerebral vascular accident) ([place]) 02/15/2019  ? Acute respiratory failure with hypoxia ([place]) 02/14/2019  ? Acute respiratory failure  due to COVID-19 [facility]) 02/14/2019  ? ARF (acute renal failure) ([place]) 02/14/2019  ? DM (diabetes mellitus), type 2 with renal complications ([place]) 24/58/0998  ? GERD (gastroesophageal reflux disease) 01/03/2018  ? Blindness 10/04/2017  ? Vitamin D deficiency 08/24/2017  ? Gastroparesis 05/04/2016  ? Anemia 10/05/2015  ? Fall 09/06/2015  ? DKA (diabetic ketoacidoses) 09/06/2015  ? Hyperkalemia 09/05/2015  ? Essential hypertension 12/20/2013  ? Diabetes mellitus ([place]) 11/19/2013  ? Other hyperlipidemia 11/19/2013  ? Central retinal artery occlusion of right eye 11/05/2013  ? ? ?Conditions to be addressed/monitored: Depression and Blindness.  Limited Social Support, Level of Care Concerns, ADL/IADL Limitations, Mental Health Concerns, Social Isolation, Limited Access to Caregiver, and Lacks Knowledge of [employer]. ? ?Care Plan : LCSW Plan of Care  ?Updates made by Francis Gaines, LCSW since 04/15/2021 12:00 AM  ?  ? ?Problem: Reduce and Manage Symptoms of Depression and Enhance Quality of Life.   ?Priority: High  ?  ? ?Long-Range Goal: Reduce and Manage Symptoms of Depression and Enhance Quality of Life.   ?Start Date: 01/04/2021  ?Expected End Date: 04/29/2021  ?This Visit's Progress: On track  ?Recent Progress: On track  ?Priority: High  ?Note:   ?Current Barriers:   ?Acute Mental Health Needs related to Blindness, Acute Renal Failure, Stage III Chronic Kidney Disease, Type II Diabetes Mellitus, Acute Respiratory Failure with Hypoxia, Depressive Symptoms, and Cerebral Vascular Accident, requires Support, Education, Resources, Referrals, Advocacy, and Care Coordination, in order to meet Unmet Acute Mental Health Needs. ?Financial Constraints related to Limited Income. ?Limited Social and Family Support, and Limited Access to Caregiver. ?ADL/IADL Limitations, and Inability to Consistently Administer Prescription Medications. ?Clinical Goal(s):  ?Patient will work with LCSW, in an effort to reduce and manage  symptoms of Depression, until well-established with a community mental health provider.       ?Patient will increase knowledge and/or ability of:  ?      Coping Skills, Healthy Habits, Self-Management Skills, Stress Reduction, Home Safety, and Utilizing [employer] and Resources.   ?Interventions: ?Collaboration with Primary Care Physician, Dr. Charlott Rakes regarding development and update of comprehensive plan of care as evidenced by provider attestation and co-signature. ?Inter-disciplinary care team collaboration (see longitudinal plan of care). ?Clinical Interventions:  ?Mindfulness Meditation Strategies, Relaxation Techniques, and Deep Breathing Exercises encouraged daily. ?Solution-Focused Therapy performed. ?Problem Solving Solutions developed. ?Cognitive Behavioral Therapy initiated. ?Support Group Participation encouraged.   ?Verbalization of Feelings emphasized. ?Emotional Support provided.   ?Patient Goals/Self-Care Activities: ?Continue to receive personal counseling with LCSW, on a bi-weekly basis, to reduce and manage symptoms of Depression, until well-established with (TTC) Timed and Targeted Counseling Services.   ?Continue to receive weekly virtual (TTC) Timed and Targeted Counseling sessions, in an effort to reduce and manage symptoms of Depression.  ?LCSW collaboration with Baird Kay, Vocational Rehabilitation Counselor/Social Worker III with Division of Services for the [facility] [phone]), to inquire about obtaining the following services for you: ?~ JAWS (Job  Access With Speech) Screen Reader, developed for computer users whose vision loss prevents them from seeing screen content or navigating with a mouse.  JAWS provides speech and Braille output for the most popular computer applications on your PC. ?~ ([place] [facility], offers comprehensive, quality services to assist you in adjusting to your  vision loss and learning to live independently and safely in your home and community. ?Services include: ?Adjustment to vision loss counseling and casework ?Adaptive communications skills ?Assistance w

## 2021-04-19 ENCOUNTER — Other Ambulatory Visit (HOSPITAL_COMMUNITY): Payer: Self-pay

## 2021-04-19 ENCOUNTER — Other Ambulatory Visit: Payer: Self-pay | Admitting: Family Medicine

## 2021-04-19 DIAGNOSIS — E1122 Type 2 diabetes mellitus with diabetic chronic kidney disease: Secondary | ICD-10-CM

## 2021-04-20 ENCOUNTER — Telehealth: Payer: Self-pay | Admitting: Family Medicine

## 2021-04-20 ENCOUNTER — Ambulatory Visit: Payer: Medicare Other | Attending: Family Medicine | Admitting: Family Medicine

## 2021-04-20 ENCOUNTER — Other Ambulatory Visit: Payer: Self-pay

## 2021-04-20 ENCOUNTER — Other Ambulatory Visit (HOSPITAL_COMMUNITY): Payer: Self-pay

## 2021-04-20 ENCOUNTER — Encounter: Payer: Self-pay | Admitting: Family Medicine

## 2021-04-20 VITALS — BP 122/80 | HR 88 | Ht 66.0 in | Wt 196.0 lb

## 2021-04-20 DIAGNOSIS — E785 Hyperlipidemia, unspecified: Secondary | ICD-10-CM

## 2021-04-20 DIAGNOSIS — J329 Chronic sinusitis, unspecified: Secondary | ICD-10-CM

## 2021-04-20 DIAGNOSIS — I129 Hypertensive chronic kidney disease with stage 1 through stage 4 chronic kidney disease, or unspecified chronic kidney disease: Secondary | ICD-10-CM

## 2021-04-20 DIAGNOSIS — Z794 Long term (current) use of insulin: Secondary | ICD-10-CM

## 2021-04-20 DIAGNOSIS — E669 Obesity, unspecified: Secondary | ICD-10-CM

## 2021-04-20 DIAGNOSIS — E1169 Type 2 diabetes mellitus with other specified complication: Secondary | ICD-10-CM

## 2021-04-20 DIAGNOSIS — K5909 Other constipation: Secondary | ICD-10-CM

## 2021-04-20 DIAGNOSIS — E1122 Type 2 diabetes mellitus with diabetic chronic kidney disease: Secondary | ICD-10-CM

## 2021-04-20 DIAGNOSIS — Z6831 Body mass index (BMI) 31.0-31.9, adult: Secondary | ICD-10-CM

## 2021-04-20 LAB — GLUCOSE, POCT (MANUAL RESULT ENTRY): POC Glucose: 232 mg/dl — AB (ref 70–99)

## 2021-04-20 LAB — POCT GLYCOSYLATED HEMOGLOBIN (HGB A1C): HbA1c, POC (controlled diabetic range): 10.5 % — AB (ref 0.0–7.0)

## 2021-04-20 MED ORDER — LOSARTAN POTASSIUM-HCTZ 100-25 MG PO TABS
1.0000 | ORAL_TABLET | Freq: Every day | ORAL | 1 refills | Status: DC
  Filled 2021-04-20: qty 90, 90d supply, fill #0
  Filled 2021-07-13: qty 90, 90d supply, fill #1

## 2021-04-20 MED ORDER — FLUTICASONE PROPIONATE 50 MCG/ACT NA SUSP
2.0000 | Freq: Every day | NASAL | 2 refills | Status: DC
  Filled 2021-04-20 (×2): qty 16, 30d supply, fill #0
  Filled 2022-01-04: qty 16, 30d supply, fill #1
  Filled 2022-03-05: qty 16, 30d supply, fill #2

## 2021-04-20 MED ORDER — ATORVASTATIN CALCIUM 80 MG PO TABS
ORAL_TABLET | Freq: Every day | ORAL | 1 refills | Status: DC
  Filled 2021-04-20: qty 90, fill #0
  Filled 2021-05-10: qty 90, 90d supply, fill #0
  Filled 2021-08-02: qty 90, 90d supply, fill #1

## 2021-04-20 MED ORDER — CLOPIDOGREL BISULFATE 75 MG PO TABS
ORAL_TABLET | Freq: Every day | ORAL | 1 refills | Status: DC
  Filled 2021-04-20: qty 90, fill #0
  Filled 2021-07-13: qty 90, 90d supply, fill #0
  Filled 2021-10-04: qty 90, 90d supply, fill #1

## 2021-04-20 MED ORDER — METFORMIN HCL 500 MG PO TABS
500.0000 mg | ORAL_TABLET | Freq: Two times a day (BID) | ORAL | 1 refills | Status: DC
  Filled 2021-04-20 – 2022-01-04 (×4): qty 180, 90d supply, fill #0

## 2021-04-20 NOTE — Progress Notes (Signed)
Constipation ?Weight loss. ?

## 2021-04-20 NOTE — Progress Notes (Signed)
? ?Subjective:  ?Patient ID: Adrienne [name], female    DOB: [birth year]  Age: 66 y.o. MRN: [id number] ? ?CC: Diabetes ? ? ?HPI ?Adrienne [name] is a 66 y.o. 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 ? ?Interval History: ?States she would like to loose weight. Was on Trulicity in the past but it worsened her constipation. Does not exercise at all due to her visual impairment. ?Also constipated but states it is not a problem as she is always constipated and this has been going on for greater than 40 years. She uses Fleet enema.  She previously tried Linzess which was ineffective. ? ?Her sinus are 'terrible' as her nose hurts, she has a headache and has tearing for the last month; she has also been sneezing.  Denies presence of sinus pressure. ? ?Currently under the care of [facility] for her chronic kidney Disease.  States she was advised to follow-up in 1 year. ? ?She is currently on 25 units bid of Lantus rather than 30 units twice daily as she had hypogycemia with  values <99. Fasting sugar was in the 80s this morning.  She states when her sugars are less than 90 she experiences symptoms of hypoglycemia.  Does not have neuropathy. ?Complains she sleeps a lot.  She is home by herself all the time and listens to the news but does not have much to keep her busy. ?Goes to Bible studies once a week ?She attends [employer] [facility] [employer] but states that organization is currently undergoing some leadership changes and so they have not been so effective with that meetings. ?Past Medical History:  ?Diagnosis Date  ? Blindness of both eyes   ? Diabetes ([place])   ? High  cholesterol   ? Hypertension   ? ? ?Past Surgical History:  ?Procedure Laterality Date  ? CATARACT EXTRACTION, BILATERAL    ? Laser eye surgery, cataract surgery bi-lat  ? CESAREAN SECTION    ? Twins, [place]  ? EYE SURGERY Bilateral 2012  ? LASER, CATARACT  ? TEE WITHOUT CARDIOVERSION N/A 12/31/2013  ? Procedure: TRANSESOPHAGEAL ECHOCARDIOGRAM (TEE);  Surgeon: Laverda Page, MD;  Location: [place];  Service: Cardiovascular;  Laterality: N/A;  H&P in file  ? TUBAL LIGATION Bilateral   ? ? ?Family History  ?Problem Relation Age of Onset  ? Dementia Mother   ? Diabetes Father   ? Hypertension Father   ? Hypertension Sister   ? Diabetes Sister   ? Diabetes Sister   ? Heart disease Sister   ? ? ?Social History  ? ?Socioeconomic History  ? Marital status: Divorced  ?  Spouse name: Not on file  ? Number of children: 3  ? Years of education: 54  ? Highest education level: 12th grade  ?Occupational History  ?  Employer: [employer]  ?Tobacco Use  ? Smoking status: Former  ?  Passive exposure: Past  ? Smokeless tobacco: Never  ? Tobacco comments:  ?  QUIT IN 1987  ?Vaping Use  ? Vaping Use: Never used  ?Substance and Sexual Activity  ? Alcohol use: No  ?  Alcohol/week: 0.0 standard drinks  ?  Comment: QUIT  IN 1987  ? Drug use: No  ?  Frequency: 2.0 times per week  ?  Comment: QUIT 1980  ? Sexual activity: Not Currently  ?Other Topics Concern  ? Not on file  ?Social History Narrative  ? Patient is divorced with 3 children.  ? Patient is right handed.  ? Patient has 12 th grade education.  ? Patient has not been drinking caffeine.  ? ?Social Determinants of Health  ? ?Financial Resource Strain: Medium Risk  ? Difficulty of Paying Living Expenses: Somewhat hard  ?Food Insecurity: No Food Insecurity  ? Worried About Charity fundraiser in the Last Year: Never true  ? Ran Out of Food in the Last Year: Never true  ?Transportation Needs: No Transportation Needs  ? Lack of Transportation (Medical): No  ? Lack of  Transportation (Non-Medical): No  ?Physical Activity: Inactive  ? Days of Exercise per Week: 0 days  ? Minutes of Exercise per Session: 0 min  ?Stress: No Stress Concern Present  ? Feeling of Stress : Only a little  ?Social Connections: Moderately Integrated  ? Frequency of Communication with Friends and Family: More than three times a week  ? Frequency of Social Gatherings with Friends and Family: More than three times a week  ? Attends Religious Services: More than 4 times per year  ? Active Member of Clubs or Organizations: Yes  ? Attends Archivist Meetings: More than 4 times per year  ? Marital Status: Divorced  ? ? ?Allergies  ?Allergen Reactions  ? Codeine Itching  ? Hydrocodone Itching  ? Oxycodone Itching  ? Penicillins Itching  ? Clindamycin/Lincomycin Itching  ? Niacin And Related Itching  ? Penicillin G Itching  ? Niacin Itching and Anxiety  ?  Facial flushing  ? ? ?Outpatient Medications Prior to Visit  ?Medication Sig Dispense Refill  ? acetaminophen (TYLENOL) 500 MG tablet Take 500 mg by mouth every 6 (six) hours as needed for moderate pain.    ? aspirin EC 81 MG tablet Take 81 mg by mouth daily.     ? cetirizine (ZYRTEC) 10 MG tablet Take 1 tablet (10 mg total) by mouth daily. 30 tablet 1  ? cholecalciferol (VITAMIN D3) 25 MCG (1000 UNIT) tablet Take 1 tablet (1,000 Units total) by mouth daily. 30 tablet 3  ? Continuous Blood Gluc Sensor (FREESTYLE LIBRE 14 DAY SENSOR) MISC PLACE ONE SENSOR AND USE TO CHECK BLOOD SUGAR FOR 14 DAYS THEN REMOVE AND REPLACE. 2 each 2  ? diclofenac Sodium (VOLTAREN) 1 % GEL Apply 4 g topically 4 (four) times daily. 100 g 1  ? famotidine (PEPCID) 20 MG tablet Take 1 tablet (20 mg total) by mouth 2 (two) times daily. 180 tablet 1  ? guaiFENesin-dextromethorphan (ROBITUSSIN DM) 100-10 MG/5ML syrup Take 5 mLs by mouth every 6 (six) hours as needed for cough. 118 mL 0  ? insulin glargine (LANTUS) 100 UNIT/ML Solostar Pen Inject 30 Units into the skin 2 (two) times  daily. 30 mL 6  ? Insulin Pen Needle 31G X 5 MM MISC use as directed 4 (four) times daily - after meals and at bedtime. 100 each 3  ? metoCLOPramide (REGLAN) 5 MG tablet Take 1 tablet (5 mg total) by mouth every 8 (eight) hours as needed for nausea. 90 tablet 1  ? Misc. Devices MISC Blood pressure monitor. Dx: Hypertension 1 each 0  ? atorvastatin (LIPITOR) 80 MG tablet TAKE 1 TABLET (80 MG TOTAL) BY MOUTH DAILY. 90 tablet  1  ? clopidogrel (PLAVIX) 75 MG tablet TAKE 1 TABLET (75 MG TOTAL) BY MOUTH DAILY. 90 tablet 0  ? fluticasone (FLONASE) 50 MCG/ACT nasal spray PLACE 2 SPRAYS INTO BOTH NOSTRILS DAILY. 16 g 2  ? losartan-hydrochlorothiazide (HYZAAR) 100-25 MG tablet TAKE 1 TABLET BY MOUTH DAILY. 90 tablet 0  ? metFORMIN (GLUCOPHAGE) 500 MG tablet Take 1 tablet (500 mg total) by mouth 2 (two) times daily with a meal. 180 tablet 0  ? ?No facility-administered medications prior to visit.  ? ? ? ?ROS ?Review of Systems  ?Constitutional:  Negative for activity change, appetite change and fatigue.  ?HENT:  Negative for congestion, sinus pressure and sore throat.   ?Eyes:  Positive for visual disturbance.  ?Respiratory:  Negative for cough, chest tightness, shortness of breath and wheezing.   ?Cardiovascular:  Negative for chest pain and palpitations.  ?Gastrointestinal:  Positive for constipation. Negative for abdominal distention and abdominal pain.  ?Endocrine: Negative for polydipsia.  ?Genitourinary:  Negative for dysuria and frequency.  ?Musculoskeletal:  Negative for arthralgias and back pain.  ?Skin:  Negative for rash.  ?Neurological:  Negative for tremors, light-headedness and numbness.  ?Hematological:  Does not bruise/bleed easily.  ?Psychiatric/Behavioral:  Negative for agitation and behavioral problems.   ? ?Objective:  ?BP 122/80   Pulse 88   Ht '5\' 6"'$  (1.676 m)   Wt 196 lb (88.9 kg)   SpO2 96%   BMI 31.64 kg/m?  ? ? ?  04/20/2021  ?  3:19 PM 01/19/2021  ?  3:22 PM 10/19/2020  ?  4:31 PM  ?BP/Weight   ?Systolic BP 130 865 784  ?Diastolic BP 80 79 84  ?Wt. (Lbs) 196  196  ?BMI 31.64 kg/m2  31.64 kg/m2  ? ? ? ? ?Physical Exam ?Constitutional:   ?   Appearance: She is well-developed.  ?HENT:  ?   Right Ear: Tympanic me

## 2021-04-20 NOTE — Telephone Encounter (Signed)
Can you please link her up with activities in the community for the visually impaired?  She is currently blind in both eyes and will benefit from engaging in rehab,, exercising, networking with people of similar challenges.  Thank you ?

## 2021-04-21 LAB — CMP14+EGFR
ALT: 17 IU/L (ref 0–32)
AST: 16 IU/L (ref 0–40)
Albumin/Globulin Ratio: 1.8 (ref 1.2–2.2)
Albumin: 4.4 g/dL (ref 3.8–4.8)
Alkaline Phosphatase: 101 IU/L (ref 44–121)
BUN/Creatinine Ratio: 20 (ref 12–28)
BUN: 35 mg/dL — ABNORMAL HIGH (ref 8–27)
Bilirubin Total: 0.3 mg/dL (ref 0.0–1.2)
CO2: 22 mmol/L (ref 20–29)
Calcium: 10 mg/dL (ref 8.7–10.3)
Chloride: 100 mmol/L (ref 96–106)
Creatinine, Ser: 1.79 mg/dL — ABNORMAL HIGH (ref 0.57–1.00)
Globulin, Total: 2.4 g/dL (ref 1.5–4.5)
Glucose: 225 mg/dL — ABNORMAL HIGH (ref 70–99)
Potassium: 4.2 mmol/L (ref 3.5–5.2)
Sodium: 139 mmol/L (ref 134–144)
Total Protein: 6.8 g/dL (ref 6.0–8.5)
eGFR: 31 mL/min/{1.73_m2} — ABNORMAL LOW (ref >59)

## 2021-04-29 ENCOUNTER — Other Ambulatory Visit: Payer: Self-pay | Admitting: *Deleted

## 2021-04-30 NOTE — Patient Outreach (Signed)
Care Management ?Clinical Social Work Note ? ?04/30/2021 ?Name: Adrienne [name] MRN: [id number] DOB: February 06, 1955 ? ?Adrienne [name] is a 66 y.o. year old female who is a primary care patient of Charlott Rakes, MD.  The Care Management team was consulted for assistance with chronic disease management and coordination needs. ? ?Engaged with patient by telephone for follow up visit in response to provider referral for social work chronic care management and care coordination services ? ?Consent to Services:  ?Adrienne [name] was given information about Care Management services today including:  ?Care Management services includes personalized support from designated clinical staff supervised by her physician, including individualized plan of care and coordination with other care providers ?24/7 contact phone numbers for assistance for urgent and routine care needs. ?The patient may stop case management services at any time by phone call to the office staff. ? ?Patient agreed to services and consent obtained.  ? ?Assessment: Review of patient past medical history, allergies, medications, and health status, including review of relevant consultants reports was performed today as part of a comprehensive evaluation and provision of chronic care management and care coordination services. ? ?SDOH (Social Determinants of Health) assessments and interventions performed:   ? ?Advanced Directives Status: Not addressed in this encounter. ? ?Care Plan ? ?Allergies  ?Allergen Reactions  ? Codeine Itching  ? Hydrocodone Itching  ? Oxycodone Itching  ? Penicillins Itching  ? Clindamycin/Lincomycin Itching  ? Niacin And Related Itching  ? Penicillin G Itching  ? Niacin Itching and Anxiety  ?  Facial flushing  ? ? ?Outpatient Encounter Medications as of 04/29/2021  ?Medication Sig Note  ? acetaminophen (TYLENOL) 500 MG tablet Take 500 mg by mouth every 6 (six) hours as needed for moderate pain.   ? aspirin EC 81 MG tablet Take 81 mg by  mouth daily.    ? atorvastatin (LIPITOR) 80 MG tablet TAKE 1 TABLET (80 MG TOTAL) BY MOUTH DAILY.   ? cetirizine (ZYRTEC) 10 MG tablet Take 1 tablet (10 mg total) by mouth daily.   ? cholecalciferol (VITAMIN D3) 25 MCG (1000 UNIT) tablet Take 1 tablet (1,000 Units total) by mouth daily.   ? clopidogrel (PLAVIX) 75 MG tablet TAKE 1 TABLET (75 MG TOTAL) BY MOUTH DAILY.   ? Continuous Blood Gluc Sensor (FREESTYLE LIBRE 14 DAY SENSOR) MISC PLACE ONE SENSOR AND USE TO CHECK BLOOD SUGAR FOR 14 DAYS THEN REMOVE AND REPLACE. 04/03/2019: Waiting on new sensor.  ? diclofenac Sodium (VOLTAREN) 1 % GEL Apply 4 g topically 4 (four) times daily.   ? famotidine (PEPCID) 20 MG tablet Take 1 tablet (20 mg total) by mouth 2 (two) times daily.   ? fluticasone (FLONASE) 50 MCG/ACT nasal spray PLACE 2 SPRAYS INTO BOTH NOSTRILS DAILY.   ? guaiFENesin-dextromethorphan (ROBITUSSIN DM) 100-10 MG/5ML syrup Take 5 mLs by mouth every 6 (six) hours as needed for cough.   ? insulin glargine (LANTUS) 100 UNIT/ML Solostar Pen Inject 30 Units into the skin 2 (two) times daily.   ? Insulin Pen Needle 31G X 5 MM MISC use as directed 4 (four) times daily - after meals and at bedtime.   ? losartan-hydrochlorothiazide (HYZAAR) 100-25 MG tablet TAKE 1 TABLET BY MOUTH DAILY.   ? metFORMIN (GLUCOPHAGE) 500 MG tablet Take 1 tablet (500 mg total) by mouth 2 (two) times daily with a meal.   ? metoCLOPramide (REGLAN) 5 MG tablet Take 1 tablet (5 mg total) by mouth every 8 (eight) hours as needed for  nausea.   ? Misc. Devices MISC Blood pressure monitor. Dx: Hypertension   ? ?No facility-administered encounter medications on file as of 04/29/2021.  ? ? ?Patient Active Problem List  ? Diagnosis Date Noted  ? Pneumonia due to COVID-19 virus 02/15/2019  ? CKD (chronic kidney disease) stage 3, GFR 30-59 ml/min (HCC) 02/15/2019  ? CVA (cerebral vascular accident) ([place]) 02/15/2019  ? Acute respiratory failure with hypoxia ([place]) 02/14/2019  ? Acute respiratory failure  due to COVID-19 [facility]) 02/14/2019  ? ARF (acute renal failure) ([place]) 02/14/2019  ? DM (diabetes mellitus), type 2 with renal complications ([place]) 93/79/0240  ? GERD (gastroesophageal reflux disease) 01/03/2018  ? Blindness 10/04/2017  ? Vitamin D deficiency 08/24/2017  ? Gastroparesis 05/04/2016  ? Anemia 10/05/2015  ? Fall 09/06/2015  ? DKA (diabetic ketoacidoses) 09/06/2015  ? Hyperkalemia 09/05/2015  ? Essential hypertension 12/20/2013  ? Diabetes mellitus ([place]) 11/19/2013  ? Other hyperlipidemia 11/19/2013  ? Central retinal artery occlusion of right eye 11/05/2013  ? ? ?Conditions to be addressed/monitored: Depression and Legal Blindness.  Limited Social Support, ADL/IADL Limitations, Mental Health Concerns, Social Isolation, Limited Access to Caregiver, and Lacks Knowledge of [employer]. ? ?Care Plan : LCSW Plan of Care  ?Updates made by Francis Gaines, LCSW since 04/30/2021 12:00 AM  ?  ? ?Problem: Reduce and Manage Symptoms of Depression and Enhance Quality of Life.   ?Priority: High  ?  ? ?Long-Range Goal: Reduce and Manage Symptoms of Depression and Enhance Quality of Life.   ?Start Date: 01/04/2021  ?Expected End Date: 05/13/2021  ?This Visit's Progress: On track  ?Recent Progress: On track  ?Priority: High  ?Note:   ?Current Barriers:   ?Acute Mental Health Needs related to Blindness, Acute Renal Failure, Stage III Chronic Kidney Disease, Type II Diabetes Mellitus, Acute Respiratory Failure with Hypoxia, Depressive Symptoms, and Cerebral Vascular Accident, requires Support, Education, Resources, Referrals, Advocacy, and Care Coordination, in order to meet Unmet Acute Mental Health Needs. ?Financial Constraints related to Limited Income. ?Limited Social and Family Support, and Limited Access to Caregiver. ?ADL/IADL Limitations, and Inability to Consistently Administer Prescription Medications. ?Clinical Goal(s):  ?Patient will work with LCSW, in an effort to reduce and manage symptoms of  Depression, until well-established with a community mental health provider.       ?Patient will increase knowledge and/or ability of:  ?      Coping Skills, Healthy Habits, Self-Management Skills, Stress Reduction, Home Safety, and Utilizing [employer] and Resources.   ?Interventions: ?Collaboration with Primary Care Physician, Dr. Charlott Rakes regarding development and update of comprehensive plan of care as evidenced by provider attestation and co-signature. ?Inter-disciplinary care team collaboration (see longitudinal plan of care). ?Clinical Interventions:  ?Mindfulness Meditation Strategies, Relaxation Techniques, and Deep Breathing Exercises encouraged daily. ?Solution-Focused Therapy performed. ?Problem Solving Solutions developed. ?Cognitive Behavioral Therapy initiated. ?Support Group Participation encouraged.   ?Verbalization of Feelings emphasized. ?Emotional Support provided.   ?Patient Goals/Self-Care Activities: ?Continue to receive personal counseling with LCSW, on a bi-weekly basis, to reduce and manage symptoms of Depression, until well-established with (TTC) Timed and Targeted Counseling Services.   ?Continue to receive weekly virtual (TTC) Timed and Targeted Counseling sessions, in an effort to reduce and manage symptoms of Depression.  ?Continue to attend VIP (Vision Inspired People) Support Group, held on the second Friday of every month, from 10:00 am - 1:00 pm, for fellowship, support, community activities, socialization, etc. ?LCSW continued collaboration with Baird Kay, Vocational Rehabilitation Counselor/Social Worker III with Division  of Services for the [place] [phone]), to enroll you in the following services and resources:   ?~ JAWS (Job Access With Speech) Screen Reader, developed for computer users whose vision loss prevents them from seeing screen content or navigating with a mouse.  JAWS provides speech and  Braille output for the most popular computer applications on your PC. ?~ ([place]) [facility], offers comprehensive, quality services to assist you in adjusting to your vision loss a

## 2021-05-03 DIAGNOSIS — Z794 Long term (current) use of insulin: Secondary | ICD-10-CM | POA: Diagnosis not present

## 2021-05-03 DIAGNOSIS — E119 Type 2 diabetes mellitus without complications: Secondary | ICD-10-CM | POA: Diagnosis not present

## 2021-05-05 ENCOUNTER — Other Ambulatory Visit: Payer: Medicare Other | Admitting: *Deleted

## 2021-05-05 NOTE — Patient Outreach (Signed)
Care Management Clinical Social Work Note  05/05/2021 Name: Adrienne [name] MRN: [id number] DOB: September 18, 1955  Adrienne [name] is a 66 y.o. year old female who is a primary care patient of Hoy Register, MD.  The Care Management team was consulted for assistance with chronic disease management and coordination needs.  Engaged with patient by telephone for follow up visit in response to provider referral for social work chronic care management and care coordination services  Consent to Services:  Ms. Welcome was given information about Care Management services today including:  Care Management services includes personalized support from designated clinical staff supervised by her physician, including individualized plan of care and coordination with other care providers 24/7 contact phone numbers for assistance for urgent and routine care needs. The patient may stop case management services at any time by phone call to the office staff.  Patient agreed to services and consent obtained.   Assessment: Review of patient past medical history, allergies, medications, and health status, including review of relevant consultants reports was performed today as part of a comprehensive evaluation and provision of chronic care management and care coordination services.  SDOH (Social Determinants of Health) assessments and interventions performed:    Advanced Directives Status: Not addressed in this encounter.  Care Plan  Allergies  Allergen Reactions   Codeine Itching   Hydrocodone Itching   Oxycodone Itching   Penicillins Itching   Clindamycin/Lincomycin Itching   Niacin And Related Itching   Penicillin G Itching   Niacin Itching and Anxiety    Facial flushing    Outpatient Encounter Medications as of 05/05/2021  Medication Sig Note   acetaminophen (TYLENOL) 500 MG tablet Take 500 mg by mouth every 6 (six) hours as needed for moderate pain.    aspirin EC 81 MG tablet Take 81 mg by  mouth daily.     atorvastatin (LIPITOR) 80 MG tablet TAKE 1 TABLET (80 MG TOTAL) BY MOUTH DAILY.    cetirizine (ZYRTEC) 10 MG tablet Take 1 tablet (10 mg total) by mouth daily.    cholecalciferol (VITAMIN D3) 25 MCG (1000 UNIT) tablet Take 1 tablet (1,000 Units total) by mouth daily.    clopidogrel (PLAVIX) 75 MG tablet TAKE 1 TABLET (75 MG TOTAL) BY MOUTH DAILY.    Continuous Blood Gluc Sensor (FREESTYLE LIBRE 14 DAY SENSOR) MISC PLACE ONE SENSOR AND USE TO CHECK BLOOD SUGAR FOR 14 DAYS THEN REMOVE AND REPLACE. 04/03/2019: Waiting on new sensor.   diclofenac Sodium (VOLTAREN) 1 % GEL Apply 4 g topically 4 (four) times daily.    famotidine (PEPCID) 20 MG tablet Take 1 tablet (20 mg total) by mouth 2 (two) times daily.    fluticasone (FLONASE) 50 MCG/ACT nasal spray PLACE 2 SPRAYS INTO BOTH NOSTRILS DAILY.    guaiFENesin-dextromethorphan (ROBITUSSIN DM) 100-10 MG/5ML syrup Take 5 mLs by mouth every 6 (six) hours as needed for cough.    insulin glargine (LANTUS) 100 UNIT/ML Solostar Pen Inject 30 Units into the skin 2 (two) times daily.    Insulin Pen Needle 31G X 5 MM MISC use as directed 4 (four) times daily - after meals and at bedtime.    losartan-hydrochlorothiazide (HYZAAR) 100-25 MG tablet TAKE 1 TABLET BY MOUTH DAILY.    metFORMIN (GLUCOPHAGE) 500 MG tablet Take 1 tablet (500 mg total) by mouth 2 (two) times daily with a meal.    metoCLOPramide (REGLAN) 5 MG tablet Take 1 tablet (5 mg total) by mouth every 8 (eight) hours as needed for  nausea.    Misc. Devices MISC Blood pressure monitor. Dx: Hypertension    No facility-administered encounter medications on file as of 05/05/2021.    Patient Active Problem List   Diagnosis Date Noted   Pneumonia due to COVID-19 virus 02/15/2019   CKD (chronic kidney disease) stage 3, GFR 30-59 ml/min (HCC) 02/15/2019   CVA (cerebral vascular accident) (HCC) 02/15/2019   Acute respiratory failure with hypoxia (HCC) 02/14/2019   Acute respiratory failure  due to COVID-19 (HCC) 02/14/2019   ARF (acute renal failure) (HCC) 02/14/2019   DM (diabetes mellitus), type 2 with renal complications (HCC) 02/14/2019   GERD (gastroesophageal reflux disease) 01/03/2018   Blindness 10/04/2017   Vitamin D deficiency 08/24/2017   Gastroparesis 05/04/2016   Anemia 10/05/2015   Fall 09/06/2015   DKA (diabetic ketoacidoses) 09/06/2015   Hyperkalemia 09/05/2015   Essential hypertension 12/20/2013   Diabetes mellitus (HCC) 11/19/2013   Other hyperlipidemia 11/19/2013   Central retinal artery occlusion of right eye 11/05/2013    Conditions to be addressed/monitored:  Acute Respiratory Failure, Type II Diabetes Mellitus, and Legally Blind.  Limited Social Support, Level of Care Concerns, ADL/IADL Limitations, Mental Health Concerns, Social Isolation, Limited Access to Caregiver, and Lacks Knowledge of [employer].  Care Plan : LCSW Plan of Care  Updates made by [name], LCSW since 05/05/2021 12:00 AM     Problem: Reduce and Manage Symptoms of Depression and Enhance Quality of Life.   Priority: High     Long-Range Goal: Reduce and Manage Symptoms of Depression and Enhance Quality of Life.   Start Date: 01/04/2021  Expected End Date: 05/13/2021  This Visit's Progress: On track  Recent Progress: On track  Priority: High  Note:   Current Barriers:   Acute Mental Health Needs related to Blindness, Acute Renal Failure, Stage III Chronic Kidney Disease, Type II Diabetes Mellitus, Acute Respiratory Failure with Hypoxia, Depressive Symptoms, and Cerebral Vascular Accident, requires Support, Education, Resources, Referrals, Advocacy, and Care Coordination, in order to meet Unmet Acute Mental Health Needs. Financial Constraints related to Limited Income. Limited Social and Family Support, and Limited Access to Caregiver. ADL/IADL Limitations, and Inability to Consistently Administer Prescription Medications. Clinical Goal(s):  Patient will work  with LCSW, in an effort to reduce and manage symptoms of Depression, until well-established with a community mental health provider.       Patient will increase knowledge and/or ability of:        Coping Skills, Healthy Habits, Self-Management Skills, Stress Reduction, Home Safety, and Utilizing [employer] and Resources.   Interventions: Collaboration with Primary Care Physician, Dr. Hoy Register regarding development and update of comprehensive plan of care as evidenced by provider attestation and co-signature. Inter-disciplinary care team collaboration (see longitudinal plan of care). Clinical Interventions:  Mindfulness Meditation Strategies, Relaxation Techniques, and Deep Breathing Exercises encouraged daily. Solution-Focused Therapy performed. Problem Solving Solutions developed. Cognitive Behavioral Therapy initiated. Support Group Participation encouraged.   Verbalization of Feelings emphasized. Emotional Support provided.   Patient Goals/Self-Care Activities: Continue to receive personal counseling with LCSW, on a bi-weekly basis, to reduce and manage symptoms of Depression, until well-established with (TTC) Timed and Targeted Counseling Services.   Continue to receive weekly virtual (TTC) Timed and Targeted Counseling sessions, in an effort to reduce and manage symptoms of Depression.  Continue to attend VIP (Vision Inspired People) Support Group, held on the second Friday of every month, from 10:00 am - 1:00 pm, for fellowship, support, community activities, socialization, etc. LCSW continued  collaboration with Maretta Bees, Vocational Rehabilitation Counselor/Social Worker III with Division of Services for the Blind - [facility] Department of Health and [employer] [phone]), to enroll you in the following services and resources:   ~ JAWS (Job Access With Speech) Screen Reader, developed for computer users whose vision loss prevents them from seeing screen  content or navigating with a mouse.  JAWS provides speech and Braille output for the most popular computer applications on your PC. ~ ([place]) [facility] for the Blind, offers comprehensive, quality services to assist you in adjusting to your vision loss and learning to live independently and safely in your home and community. ~ Vocational Rehabilitation for the Blind, can help you find a job.  You will work with a Multimedia programmer (VR) Counselor who can help you obtain the services you need to find work.   ~ Designer, multimedia, makes it possible for individuals with visual impairments to lead independent and fulfilling lives. LCSW continued collaboration with Darci Needle, Community Employment Therapist, occupational II with Division of Services for the Blind - [employer] of [employer] and [employer] [phone]), to enroll you in the following services and resources:  ~ Job Orthoptist, provides a Education officer, museum with staff specifically trained to assist you in this area to help you realize your vocational goals. ~ Surveyor, minerals, provides programs in a residential setting where you can live in on-campus dormitories with around-the-clock staffing.  You will work with the rehabilitation staff to achieve personal and career goals. Guidance and counseling is available during rehabilitation training.              ~ ABLE (Adjusting to Blindness in a Learning Environment). LCSW collaboration with representative, [place] from [employer], with the [facility] of Social Services [phone]), to check the status of your application for Food Stamps.   ~ HIPAA compliant messages left on voicemail (04/30/2021, 05/03/2021, and 05/05/2021).  Awaiting a return call. LCSW collaboration with representative from [place], [employer], with the [place] of [place] [phone]), to voice concerns  about [facility] [place] [facility]) and [employer]. ~ Return call received from [place], Diplomatic Services operational officer for [employer], indicating that she will address concerns with [facility] [place] [facility]) and [employer]. Contact LCSW directly (# [phone]), if you have questions, need assistance, or if additional social work needs are identified between now and our next scheduled telephone outreach call.  Follow-Up Date:  05/13/2021 at 1:30 pm    Danford Bad, BSW, MSW, LCSW  Licensed Clinical Social Worker  [facility]  [address] [place], [place] [address] Physical Address-[address], [place], [place] [address] Toll Free Main # [phone] Fax # [phone] Cell # [phone] [email or web address]

## 2021-05-07 ENCOUNTER — Other Ambulatory Visit: Payer: Self-pay | Admitting: *Deleted

## 2021-05-07 NOTE — Patient Instructions (Signed)
Visit Information ? ?Thank you for taking time to visit with me today. Please don't hesitate to contact me if I can be of assistance to you before our next scheduled telephone appointment. ? ?Following are the goals we discussed today:  ?Current Barriers:  ?Knowledge Deficits related to plan of care for management of DMII  ? ?RNCM Clinical Goal(s):  ?Patient will verbalize understanding of plan for management of DMII as evidenced by continuation of monitoring blood sugars and adhering to diabetic diet through collaboration with RN Care manager, provider, and care team.  ? ?Interventions: ?Inter-disciplinary care team collaboration (see longitudinal plan of care) ?Evaluation of current treatment plan related to  self management and patient's adherence to plan as established by provider ? ?Patient Goals/Self-Care Activities: ?Take medications as prescribed   ?Attend all scheduled provider appointments ?Call pharmacy for medication refills 3-7 days in advance of running out of medications ?Call provider office for new concerns or questions  ?Work with the Education officer, museum to address care coordination needs and will continue to work with the clinical team to address health care and disease management related needs ?call the Suicide and Crisis Lifeline: 988 if experiencing a [place] or [place]  ?schedule appointment with eye doctor ?check blood sugar at prescribed times: 4 times daily ?take the blood sugar meter to all doctor visits ?drink 6 to 8 glasses of water each day ?Start walking exercise routine ? ?[id number] Patient had not monitored her blood sugar this morning. She ate a bowl of cereal and banana and 2 strawberries for breakfast. Her A1C is 10.5 down from 12.9.  She has not started an exercise routine. Per patient she now has someone that has volunteered to start walking with her. Patient has not been able to order her OTC [employer] products due to blindness. RN reviewed all products and called the  order in.  ? ?Our next appointment is by telephone on August 03, 2021 ? ?Please call Johny Shock RN [phone]  if you need to cancel or reschedule your appointment.  ? ?Please call the Suicide and Crisis Lifeline: 988 if you are experiencing a Mental Health or [place] or need someone to talk to. ? ?The patient verbalized understanding of instructions, educational materials, and care plan provided today and agreed to receive a mailed copy of patient instructions, educational materials, and care plan.  ? ?Telephone follow up appointment with care management team member scheduled for: ?The patient has been provided with contact information for the care management team and has been advised to call with any health related questions or concerns.  ? ?SIGNATURE ? ?Johny Shock BSN RN ?[facility] ?[phone] ? ? ? ? ?

## 2021-05-07 NOTE — Patient Outreach (Signed)
[facility]) Care Management ?RN Health Coach Note ? ? ?05/07/2021 ?Name:  Adrienne [name] MRN:  [id number] DOB:  1955-07-15 ? ?Summary: ?Patient has not checked her fasting blood sugar this am. A1C has decreased from 12.9 to 10.5. Patient has not been exercising. She now has someone that has volunteered to walk with her.   ?Patient has been unable to order OTC products due to blindness. RN reviewed and ordered products for patient. ? ?Recommendations/Changes made from today's visit: ?Monitor blood sugars 4 X day ?Medication adherence ?RN ordered OTC [employer] products quarterly with patient ? ? ?Subjective: ?Adrienne [name] is an 66 y.o. year old female who is a primary patient of Charlott Rakes, MD. The care management team was consulted for assistance with care management and/or care coordination needs.   ? ?RN Health Coach completed Telephone Visit today.  ? ?Objective: ? ?Medications Reviewed Today   ? ? Reviewed by [name], RN (Case Manager) on 05/07/21 at 67  Med List Status: <None>  ? ?Medication Order Taking? Sig Documenting Provider Last Dose Status Informant  ?acetaminophen (TYLENOL) 500 MG tablet [id number] No Take 500 mg by mouth every 6 (six) hours as needed for moderate pain. [provider] Taking Active Self  ?aspirin EC 81 MG tablet [id number] No Take 81 mg by mouth daily.  [provider] Taking Active Self  ?         ?Med Note [place] Silos, AMY C   Sat Sep 05, 2015 11:06 PM)    ?atorvastatin (LIPITOR) 80 MG tablet [id number]  TAKE 1 TABLET (80 MG TOTAL) BY MOUTH DAILY. Charlott Rakes, MD  Active   ?cetirizine (ZYRTEC) 10 MG tablet [id number] No Take 1 tablet (10 mg total) by mouth daily. Charlott Rakes, MD Taking Active   ?cholecalciferol (VITAMIN D3) 25 MCG (1000 UNIT) tablet [id number] No Take 1 tablet (1,000 Units total) by mouth daily. Charlott Rakes, MD Taking Active   ?clopidogrel (PLAVIX) 75 MG tablet [id number]  TAKE 1 TABLET (75 MG TOTAL) BY  MOUTH DAILY. Charlott Rakes, MD  Active   ?Continuous Blood Gluc Sensor (FREESTYLE LIBRE 14 DAY SENSOR) MISC [id number] No PLACE ONE SENSOR AND USE TO CHECK BLOOD SUGAR FOR 14 DAYS THEN REMOVE AND REPLACE. Charlott Rakes, MD Taking Active Self  ?         ?Med Note Radene Ou Apr 03, 2019  1:06 PM) Waiting on new sensor.  ?diclofenac Sodium (VOLTAREN) 1 % GEL [id number] No Apply 4 g topically 4 (four) times daily. Charlott Rakes, MD Taking Active   ?famotidine (PEPCID) 20 MG tablet [id number] No Take 1 tablet (20 mg total) by mouth 2 (two) times daily. Charlott Rakes, MD Taking Active Self  ?fluticasone (FLONASE) 50 MCG/ACT nasal spray [id number]  PLACE 2 SPRAYS INTO BOTH NOSTRILS DAILY. Charlott Rakes, MD  Active   ?guaiFENesin-dextromethorphan (ROBITUSSIN DM) 100-10 MG/5ML syrup [id number] No Take 5 mLs by mouth every 6 (six) hours as needed for cough. Arrien, Jimmy Picket, MD Taking Active   ?insulin glargine (LANTUS) 100 UNIT/ML Solostar Pen [id number] No Inject 30 Units into the skin 2 (two) times daily. Charlott Rakes, MD Taking Active   ?Insulin Pen Needle 31G X 5 MM MISC [id number] No use as directed 4 (four) times daily - after meals and at bedtime. Charlott Rakes, MD Taking Active   ?losartan-hydrochlorothiazide (HYZAAR) 100-25 MG tablet [id number]  TAKE 1 TABLET BY MOUTH DAILY. Charlott Rakes, MD  Active   ?metFORMIN (GLUCOPHAGE) 500  MG tablet [id number]  Take 1 tablet (500 mg total) by mouth 2 (two) times daily with a meal. Charlott Rakes, MD  Active   ?metoCLOPramide (REGLAN) 5 MG tablet [id number] No Take 1 tablet (5 mg total) by mouth every 8 (eight) hours as needed for nausea. Charlott Rakes, MD Taking Active   ?Misc. Devices [place] [id number] No Blood pressure monitor. Dx: Hypertension Charlott Rakes, MD Taking Active Self  ? ?  ?  ? ?  ? ? ? ?SDOH:  (Social Determinants of Health) assessments and interventions performed:  ? ? ?Care Plan ? ?Review of patient past medical history,  allergies, medications, health status, including review of consultants reports, laboratory and other test data, was performed as part of comprehensive evaluation for care management services.  ? ?Care Plan : RN Care Manager Plan of Care  ?Updates made by Hawraa Stambaugh, Eppie Gibson, RN since 05/07/2021 12:00 AM  ?  ? ?Problem: Knowledge Deficit Related to Diabetes and Care Coordination Needs   ?Priority: High  ?  ? ?Long-Range Goal: Development Plan of Care fro Management of Diabetes   ?Start Date: 01/15/2021  ?Expected End Date: 01/15/2022  ?Priority: High  ?Note:   ?Current Barriers:  ?Knowledge Deficits related to plan of care for management of DMII  ? ?RNCM Clinical Goal(s):  ?Patient will verbalize understanding of plan for management of DMII as evidenced by continuation of monitoring blood sugars and adhering to diabetic diet  through collaboration with RN Care manager, provider, and care team.  ? ?Interventions: ?Inter-disciplinary care team collaboration (see longitudinal plan of care) ?Evaluation of current treatment plan related to  self management and patient's adherence to plan as established by provider ? ?Patient Goals/Self-Care Activities: ?Take medications as prescribed   ?Attend all scheduled provider appointments ?Call pharmacy for medication refills 3-7 days in advance of running out of medications ?Call provider office for new concerns or questions  ?Work with the Education officer, museum to address care coordination needs and will continue to work with the clinical team to address health care and disease management related needs ?call the Suicide and Crisis Lifeline: 988 if experiencing a [place] or [place]  ?schedule appointment with eye doctor ?check blood sugar at prescribed times: 4 times daily ?take the blood sugar meter to all doctor visits ?drink 6 to 8 glasses of water each day ?Start walking exercise routine ? ?[id number] Patient had not monitored her blood sugar this morning. She ate  a bowl of cereal and banana and 2 strawberries for breakfast. Her A1C is 10.5 down from 12.9.  She has not started an exercise routine. Per patient she now has someone that has volunteered to start walking with her. Patient has not been able to order her OTC [employer] products due to blindness. RN reviewed all products and called the order in.  ?  ?  ? ?Plan: Telephone follow up appointment with care management team member scheduled for:  August 03, 2021 ?The patient has been provided with contact information for the care management team and has been advised to call with any health related questions or concerns.  ? ?Johny Shock BSN RN ?[facility] ?[phone] ? ? ? ?  ?

## 2021-05-10 ENCOUNTER — Other Ambulatory Visit (HOSPITAL_COMMUNITY): Payer: Self-pay

## 2021-05-11 ENCOUNTER — Other Ambulatory Visit (HOSPITAL_COMMUNITY): Payer: Self-pay

## 2021-05-13 ENCOUNTER — Other Ambulatory Visit: Payer: Self-pay | Admitting: *Deleted

## 2021-05-13 NOTE — Patient Outreach (Signed)
Care Management ?Clinical Social Work Note ? ?05/13/2021 ?Name: Adrienne [name] MRN: [id number] DOB: [birth year] ? ?Adrienne [name] is a 66 y.o. year old female who is a primary care patient of Adrienne Rakes, MD.  The Care Management team was consulted for assistance with chronic disease management and coordination needs. ? ?Engaged with patient by telephone for follow up visit in response to provider referral for social work chronic care management and care coordination services ? ?Consent to Services:  ?Adrienne [name] was given information about Care Management services today including:  ?Care Management services includes personalized support from designated clinical staff supervised by her physician, including individualized plan of care and coordination with other care providers ?24/7 contact phone numbers for assistance for urgent and routine care needs. ?The patient may stop case management services at any time by phone call to the office staff. ? ?Patient agreed to services and consent obtained.  ? ?Assessment: Review of patient past medical history, allergies, medications, and health status, including review of relevant consultants reports was performed today as part of a comprehensive evaluation and provision of chronic care management and care coordination services. ? ?SDOH (Social Determinants of Health) assessments and interventions performed:   ? ?Advanced Directives Status: Not addressed in this encounter. ? ?Care Plan ? ?Allergies  ?Allergen Reactions  ? Codeine Itching  ? Hydrocodone Itching  ? Oxycodone Itching  ? Penicillins Itching  ? Clindamycin/Lincomycin Itching  ? Niacin And Related Itching  ? Penicillin G Itching  ? Niacin Itching and Anxiety  ?  Facial flushing  ? ? ?Outpatient Encounter Medications as of 05/13/2021  ?Medication Sig Note  ? acetaminophen (TYLENOL) 500 MG tablet Take 500 mg by mouth every 6 (six) hours as needed for moderate pain.   ? aspirin EC 81 MG tablet Take 81 mg by  mouth daily.    ? atorvastatin (LIPITOR) 80 MG tablet TAKE 1 TABLET (80 MG TOTAL) BY MOUTH DAILY.   ? cetirizine (ZYRTEC) 10 MG tablet Take 1 tablet (10 mg total) by mouth daily.   ? cholecalciferol (VITAMIN D3) 25 MCG (1000 UNIT) tablet Take 1 tablet (1,000 Units total) by mouth daily.   ? clopidogrel (PLAVIX) 75 MG tablet TAKE 1 TABLET (75 MG TOTAL) BY MOUTH DAILY.   ? Continuous Blood Gluc Sensor (FREESTYLE LIBRE 14 DAY SENSOR) MISC PLACE ONE SENSOR AND USE TO CHECK BLOOD SUGAR FOR 14 DAYS THEN REMOVE AND REPLACE. 04/03/2019: Waiting on new sensor.  ? diclofenac Sodium (VOLTAREN) 1 % GEL Apply 4 g topically 4 (four) times daily.   ? famotidine (PEPCID) 20 MG tablet Take 1 tablet (20 mg total) by mouth 2 (two) times daily.   ? fluticasone (FLONASE) 50 MCG/ACT nasal spray PLACE 2 SPRAYS INTO BOTH NOSTRILS DAILY.   ? guaiFENesin-dextromethorphan (ROBITUSSIN DM) 100-10 MG/5ML syrup Take 5 mLs by mouth every 6 (six) hours as needed for cough.   ? insulin glargine (LANTUS) 100 UNIT/ML Solostar Pen Inject 30 Units into the skin 2 (two) times daily.   ? Insulin Pen Needle 31G X 5 MM MISC use as directed 4 (four) times daily - after meals and at bedtime.   ? losartan-hydrochlorothiazide (HYZAAR) 100-25 MG tablet TAKE 1 TABLET BY MOUTH DAILY.   ? metFORMIN (GLUCOPHAGE) 500 MG tablet Take 1 tablet (500 mg total) by mouth 2 (two) times daily with a meal.   ? metoCLOPramide (REGLAN) 5 MG tablet Take 1 tablet (5 mg total) by mouth every 8 (eight) hours as needed for  nausea.   ? Misc. Devices MISC Blood pressure monitor. Dx: Hypertension   ? ?No facility-administered encounter medications on file as of 05/13/2021.  ? ? ?Patient Active Problem List  ? Diagnosis Date Noted  ? Pneumonia due to COVID-19 virus 02/15/2019  ? CKD (chronic kidney disease) stage 3, GFR 30-59 ml/min (HCC) 02/15/2019  ? CVA (cerebral vascular accident) ([place]) 02/15/2019  ? Acute respiratory failure with hypoxia ([place]) 02/14/2019  ? Acute respiratory failure  due to COVID-19 [facility]) 02/14/2019  ? ARF (acute renal failure) ([place]) 02/14/2019  ? DM (diabetes mellitus), type 2 with renal complications ([place]) 02/63/7858  ? GERD (gastroesophageal reflux disease) 01/03/2018  ? Blindness 10/04/2017  ? Vitamin D deficiency 08/24/2017  ? Gastroparesis 05/04/2016  ? Anemia 10/05/2015  ? Fall 09/06/2015  ? DKA (diabetic ketoacidoses) 09/06/2015  ? Hyperkalemia 09/05/2015  ? Essential hypertension 12/20/2013  ? Diabetes mellitus ([place]) 11/19/2013  ? Other hyperlipidemia 11/19/2013  ? Central retinal artery occlusion of right eye 11/05/2013  ? ? ?Conditions to be addressed/monitored: Anxiety and Depression.  Limited Social Support, Mental Health Concerns, Social Isolation, Limited Access to Caregiver, and Lacks Knowledge of [employer]. ? ?Care Plan : LCSW Plan of Care  ?Updates made by Adrienne Gaines, LCSW since 05/13/2021 12:00 AM  ?  ? ?Problem: Reduce and Manage Symptoms of Depression and Enhance Quality of Life.   ?Priority: High  ?  ? ?Long-Range Goal: Reduce and Manage Symptoms of Depression and Enhance Quality of Life.   ?Start Date: 01/04/2021  ?Expected End Date: 07/13/2021  ?This Visit's Progress: On track  ?Recent Progress: On track  ?Priority: High  ?Note:   ?Current Barriers:   ?Acute Mental Health Needs related to Blindness, Acute Renal Failure, Stage III Chronic Kidney Disease, Type II Diabetes Mellitus, Acute Respiratory Failure with Hypoxia, Depressive Symptoms, and Cerebral Vascular Accident, requires Support, Education, Resources, Referrals, Advocacy, and Care Coordination, in order to meet Unmet Acute Mental Health Needs. ?Financial Constraints related to Limited Income. ?Limited Social and Family Support, and Limited Access to Caregiver. ?ADL/IADL Limitations, and Inability to Consistently Administer Prescription Medications. ?Clinical Goal(s):  ?Patient will work with LCSW, in an effort to reduce and manage symptoms of Depression, until well-established  with a community mental health provider.       ?Patient will increase knowledge and/or ability of:  ?      Coping Skills, Healthy Habits, Self-Management Skills, Stress Reduction, Home Safety, and Utilizing [employer]s and Resources.   ?Interventions: ?Collaboration with Primary Care Physician, Dr. Charlott [name] regarding development and update of comprehensive plan of care as evidenced by provider attestation and co-signature. ?Inter-disciplinary care team collaboration (see longitudinal plan of care). ?Clinical Interventions:  ?Mindfulness Meditation Strategies, Relaxation Techniques, and Deep Breathing Exercises encouraged daily. ?Solution-Focused Therapy performed. ?Problem Solving Solutions developed. ?Cognitive Behavioral Therapy initiated. ?Support Group Participation encouraged.   ?Verbalization of Feelings emphasized. ?Emotional Support provided.   ?Patient Goals/Self-Care Activities: ?Continue to receive personal counseling with LCSW, on a bi-weekly basis, to reduce and manage symptoms of Depression, until well-controlled.    ?Continue to receive weekly virtual (TTC) Timed and Targeted Counseling sessions, in an effort to reduce and manage symptoms of Depression.  ?Continue to attend VIP (Vision Inspired People) Support Group, held on the second Friday of every month, from 10:00 am - 1:00 pm, for fellowship, support, community activities, socialization, Social research officer, government. ?LCSW continued collaboration with Baird Kay, Vocational Rehabilitation Counselor/Social Worker III with Division of Services for the Blind - [employer]  [facility] [employer] [phone]), to enroll you in the following services and resources:   ?~ JAWS (Job Access With Speech) Screen Reader, developed for computer users whose vision loss prevents them from seeing screen content or navigating with a mouse.  JAWS provides speech and Braille output for the most popular computer applications on your PC. ?~ ([place])  [facility], offers comprehensive, quality services to assist you in adjusting to your vision loss and learning to live independently and safely in your home and community. ?~ [place]

## 2021-05-14 NOTE — Telephone Encounter (Signed)
I attempted to call pt, no answer, left detailed vm for services of the blind. Will send resources in the mail. ?

## 2021-05-17 ENCOUNTER — Telehealth: Payer: Self-pay | Admitting: Family Medicine

## 2021-05-17 NOTE — Telephone Encounter (Signed)
Copied from [place] [id number]. Topic: General - Other ?>> May 14, 2021 11:09 AM Oneta Rack wrote: ?Patient received a call with no voicemail. ? ?No documentation on call, please contact pt again leaving a vm if not reached pertaing reason for call. ?

## 2021-05-26 ENCOUNTER — Other Ambulatory Visit: Payer: Self-pay | Admitting: *Deleted

## 2021-05-26 NOTE — Patient Outreach (Signed)
Care Management ?Clinical Social Work Note ? ?05/26/2021 ?Name: Adrienne [name] MRN: [id number] DOB: 14-Aug-1955 ? ?Adrienne [name] is a 66 y.o. year old female who is a primary care patient of Charlott Rakes, MD.  The Care Management team was consulted for assistance with chronic disease management and coordination needs. ? ?Engaged with patient by telephone for follow up visit in response to provider referral for social work chronic care management and care coordination services ? ?Consent to Services:  ?Ms. [name] was given information about Care Management services today including:  ?Care Management services includes personalized support from designated clinical staff supervised by her physician, including individualized plan of care and coordination with other care providers ?24/7 contact phone numbers for assistance for urgent and routine care needs. ?The patient may stop case management services at any time by phone call to the office staff. ? ?Patient agreed to services and consent obtained.  ? ?Assessment: Review of patient past medical history, allergies, medications, and health status, including review of relevant consultants reports was performed today as part of a comprehensive evaluation and provision of chronic care management and care coordination services. ? ?SDOH (Social Determinants of Health) assessments and interventions performed:   ? ?Advanced Directives Status: Not addressed in this encounter. ? ?Care Plan ? ?Allergies  ?Allergen Reactions  ? Codeine Itching  ? Hydrocodone Itching  ? Oxycodone Itching  ? Penicillins Itching  ? Clindamycin/Lincomycin Itching  ? Niacin And Related Itching  ? Penicillin G Itching  ? Niacin Itching and Anxiety  ?  Facial flushing  ? ? ?Outpatient Encounter Medications as of 05/26/2021  ?Medication Sig Note  ? acetaminophen (TYLENOL) 500 MG tablet Take 500 mg by mouth every 6 (six) hours as needed for moderate pain.   ? aspirin EC 81 MG tablet Take 81 mg by  mouth daily.    ? atorvastatin (LIPITOR) 80 MG tablet TAKE 1 TABLET (80 MG TOTAL) BY MOUTH DAILY.   ? cetirizine (ZYRTEC) 10 MG tablet Take 1 tablet (10 mg total) by mouth daily.   ? cholecalciferol (VITAMIN D3) 25 MCG (1000 UNIT) tablet Take 1 tablet (1,000 Units total) by mouth daily.   ? clopidogrel (PLAVIX) 75 MG tablet TAKE 1 TABLET (75 MG TOTAL) BY MOUTH DAILY.   ? Continuous Blood Gluc Sensor (FREESTYLE LIBRE 14 DAY SENSOR) MISC PLACE ONE SENSOR AND USE TO CHECK BLOOD SUGAR FOR 14 DAYS THEN REMOVE AND REPLACE. 04/03/2019: Waiting on new sensor.  ? diclofenac Sodium (VOLTAREN) 1 % GEL Apply 4 g topically 4 (four) times daily.   ? famotidine (PEPCID) 20 MG tablet Take 1 tablet (20 mg total) by mouth 2 (two) times daily.   ? fluticasone (FLONASE) 50 MCG/ACT nasal spray PLACE 2 SPRAYS INTO BOTH NOSTRILS DAILY.   ? guaiFENesin-dextromethorphan (ROBITUSSIN DM) 100-10 MG/5ML syrup Take 5 mLs by mouth every 6 (six) hours as needed for cough.   ? insulin glargine (LANTUS) 100 UNIT/ML Solostar Pen Inject 30 Units into the skin 2 (two) times daily.   ? Insulin Pen Needle 31G X 5 MM MISC use as directed 4 (four) times daily - after meals and at bedtime.   ? losartan-hydrochlorothiazide (HYZAAR) 100-25 MG tablet TAKE 1 TABLET BY MOUTH DAILY.   ? metFORMIN (GLUCOPHAGE) 500 MG tablet Take 1 tablet (500 mg total) by mouth 2 (two) times daily with a meal.   ? metoCLOPramide (REGLAN) 5 MG tablet Take 1 tablet (5 mg total) by mouth every 8 (eight) hours as needed for  nausea.   ? Misc. Devices MISC Blood pressure monitor. Dx: Hypertension   ? ?No facility-administered encounter medications on file as of 05/26/2021.  ? ? ?Patient Active Problem List  ? Diagnosis Date Noted  ? Pneumonia due to COVID-19 virus 02/15/2019  ? CKD (chronic kidney disease) stage 3, GFR 30-59 ml/min (HCC) 02/15/2019  ? CVA (cerebral vascular accident) ([place]) 02/15/2019  ? Acute respiratory failure with hypoxia ([place]) 02/14/2019  ? Acute respiratory failure  due to COVID-19 [facility]) 02/14/2019  ? ARF (acute renal failure) ([place]) 02/14/2019  ? DM (diabetes mellitus), type 2 with renal complications ([place]) 36/14/4315  ? GERD (gastroesophageal reflux disease) 01/03/2018  ? Blindness 10/04/2017  ? Vitamin D deficiency 08/24/2017  ? Gastroparesis 05/04/2016  ? Anemia 10/05/2015  ? Fall 09/06/2015  ? DKA (diabetic ketoacidoses) 09/06/2015  ? Hyperkalemia 09/05/2015  ? Essential hypertension 12/20/2013  ? Diabetes mellitus ([place]) 11/19/2013  ? Other hyperlipidemia 11/19/2013  ? Central retinal artery occlusion of right eye 11/05/2013  ? ? ?Conditions to be addressed/monitored: DMII, Anxiety, and Depression.  Limited Social Support, ADL/IADL Limitations, Mental Health Concerns, Social Isolation, Limited Access to Caregiver, and Lacks Knowledge of [employer]. ? ?Care Plan : LCSW Plan of Care  ?Updates made by Francis Gaines, LCSW since 05/26/2021 12:00 AM  ?  ? ?Problem: Reduce and Manage Symptoms of Depression and Enhance Quality of Life.   ?Priority: High  ?  ? ?Long-Range Goal: Reduce and Manage Symptoms of Depression and Enhance Quality of Life.   ?Start Date: 01/04/2021  ?Expected End Date: 07/13/2021  ?This Visit's Progress: On track  ?Recent Progress: On track  ?Priority: High  ?Note:   ?Current Barriers:   ?Acute Mental Health Needs related to Blindness, Acute Renal Failure, Stage III Chronic Kidney Disease, Type II Diabetes Mellitus, Acute Respiratory Failure with Hypoxia, Depressive Symptoms, and Cerebral Vascular Accident, requires Support, Education, Resources, Referrals, Advocacy, and Care Coordination, in order to meet Unmet Acute Mental Health Needs. ?Financial Constraints related to Limited Income. ?Limited Social and Family Support, and Limited Access to Caregiver. ?ADL/IADL Limitations, and Inability to Consistently Administer Prescription Medications. ?Clinical Goal(s):  ?Patient will work with LCSW, in an effort to reduce and manage symptoms of  Depression, until well-established with a community mental health provider.       ?Patient will increase knowledge and/or ability of:  ?      Coping Skills, Healthy Habits, Self-Management Skills, Stress Reduction, Home Safety, and Utilizing [employer] and Resources.   ?Interventions: ?Collaboration with Primary Care Physician, Dr. Charlott Rakes regarding development and update of comprehensive plan of care as evidenced by provider attestation and co-signature. ?Inter-disciplinary care team collaboration (see longitudinal plan of care). ?Clinical Interventions:  ?Mindfulness Meditation Strategies, Relaxation Techniques, and Deep Breathing Exercises encouraged daily. ?Solution-Focused Therapy performed. ?Cognitive Behavioral Therapy initiated. ?Client-Centered Therapy developed.   ?Verbalization of Feelings emphasized. ?Emotional Support provided.   ?Patient Goals/Self-Care Activities: ?Continue to receive personal counseling with LCSW, on a bi-weekly basis, to reduce and manage symptoms of Depression, until well-controlled.    ?Continue to receive weekly virtual (TTC) Timed and Targeted Counseling sessions, in an effort to reduce and manage symptoms of Depression.  ?Continue to attend VIP (Vision Inspired People) Support Group, held on the second Friday of every month, from 10:00 am - 1:00 pm, for fellowship, support, community activities, socialization, etc. ?Be prepared to review Advanced Directives (Living Will and [place]) documents with LCSW on 05/27/2021 at 2:15 pm. ?Be prepared to  complete Advanced Directives (Living Will and [place]) documents with LCSW on 05/28/2021 at 11:00 am. ?Contact LCSW directly (# [phone]), if you have questions, need assistance, or if additional social work needs are identified between now and our next scheduled telephone outreach call.  ?Follow-Up Date:  05/27/2021 at 2:15 pm ?  ?[name], BSW, MSW, LCSW  ?Licensed  Clinical Social Worker  ?[place] [facility] ?[facility]  ?Mailing Address-[address], [place], [place] [address] ?Physical Address-[address], [place], [place] [address] ?Toll Fr

## 2021-05-27 ENCOUNTER — Ambulatory Visit: Payer: Self-pay | Admitting: *Deleted

## 2021-05-27 ENCOUNTER — Other Ambulatory Visit: Payer: Self-pay | Admitting: *Deleted

## 2021-05-28 ENCOUNTER — Ambulatory Visit: Payer: Medicare Other | Admitting: *Deleted

## 2021-05-29 NOTE — Patient Outreach (Signed)
Care Management ?Clinical Social Work Note ? ?05/29/2021 ?Name: Adrienne [name] MRN: [id number] DOB: 11/24/1955 ? ?Adrienne [name] is a 66 y.o. year old female who is a primary care patient of Charlott Rakes, MD.  The Care Management team was consulted for assistance with chronic disease management and coordination needs. ? ?Engaged with patient by telephone for follow up visit in response to provider referral for social work chronic care management and care coordination services ? ?Consent to Services:  ?Adrienne [name] was given information about Care Management services today including:  ?Care Management services includes personalized support from designated clinical staff supervised by her physician, including individualized plan of care and coordination with other care providers ?24/7 contact phone numbers for assistance for urgent and routine care needs. ?The patient may stop case management services at any time by phone call to the office staff. ? ?Patient agreed to services and consent obtained.  ? ?Assessment: Review of patient past medical history, allergies, medications, and health status, including review of relevant consultants reports was performed today as part of a comprehensive evaluation and provision of chronic care management and care coordination services. ? ?SDOH (Social Determinants of Health) assessments and interventions performed:   ? ?Advanced Directives Status: Assisted with Completion. ? ?Care Plan ? ?Allergies  ?Allergen Reactions  ? Codeine Itching  ? Hydrocodone Itching  ? Oxycodone Itching  ? Penicillins Itching  ? Clindamycin/Lincomycin Itching  ? Niacin And Related Itching  ? Penicillin G Itching  ? Niacin Itching and Anxiety  ?  Facial flushing  ? ? ?Outpatient Encounter Medications as of 05/27/2021  ?Medication Sig Note  ? acetaminophen (TYLENOL) 500 MG tablet Take 500 mg by mouth every 6 (six) hours as needed for moderate pain.   ? aspirin EC 81 MG tablet Take 81 mg by mouth  daily.    ? atorvastatin (LIPITOR) 80 MG tablet TAKE 1 TABLET (80 MG TOTAL) BY MOUTH DAILY.   ? cetirizine (ZYRTEC) 10 MG tablet Take 1 tablet (10 mg total) by mouth daily.   ? cholecalciferol (VITAMIN D3) 25 MCG (1000 UNIT) tablet Take 1 tablet (1,000 Units total) by mouth daily.   ? clopidogrel (PLAVIX) 75 MG tablet TAKE 1 TABLET (75 MG TOTAL) BY MOUTH DAILY.   ? Continuous Blood Gluc Sensor (FREESTYLE LIBRE 14 DAY SENSOR) MISC PLACE ONE SENSOR AND USE TO CHECK BLOOD SUGAR FOR 14 DAYS THEN REMOVE AND REPLACE. 04/03/2019: Waiting on new sensor.  ? diclofenac Sodium (VOLTAREN) 1 % GEL Apply 4 g topically 4 (four) times daily.   ? famotidine (PEPCID) 20 MG tablet Take 1 tablet (20 mg total) by mouth 2 (two) times daily.   ? fluticasone (FLONASE) 50 MCG/ACT nasal spray PLACE 2 SPRAYS INTO BOTH NOSTRILS DAILY.   ? guaiFENesin-dextromethorphan (ROBITUSSIN DM) 100-10 MG/5ML syrup Take 5 mLs by mouth every 6 (six) hours as needed for cough.   ? insulin glargine (LANTUS) 100 UNIT/ML Solostar Pen Inject 30 Units into the skin 2 (two) times daily.   ? Insulin Pen Needle 31G X 5 MM MISC use as directed 4 (four) times daily - after meals and at bedtime.   ? losartan-hydrochlorothiazide (HYZAAR) 100-25 MG tablet TAKE 1 TABLET BY MOUTH DAILY.   ? metFORMIN (GLUCOPHAGE) 500 MG tablet Take 1 tablet (500 mg total) by mouth 2 (two) times daily with a meal.   ? metoCLOPramide (REGLAN) 5 MG tablet Take 1 tablet (5 mg total) by mouth every 8 (eight) hours as needed for nausea.   ?  Misc. Devices MISC Blood pressure monitor. Dx: Hypertension   ? ?No facility-administered encounter medications on file as of 05/27/2021.  ? ? ?Patient Active Problem List  ? Diagnosis Date Noted  ? Pneumonia due to COVID-19 virus 02/15/2019  ? CKD (chronic kidney disease) stage 3, GFR 30-59 ml/min (HCC) 02/15/2019  ? CVA (cerebral vascular accident) ([place]) 02/15/2019  ? Acute respiratory failure with hypoxia ([place]) 02/14/2019  ? Acute respiratory failure due to  COVID-19 [facility]) 02/14/2019  ? ARF (acute renal failure) ([place]) 02/14/2019  ? DM (diabetes mellitus), type 2 with renal complications ([place]) 13/08/6576  ? GERD (gastroesophageal reflux disease) 01/03/2018  ? Blindness 10/04/2017  ? Vitamin D deficiency 08/24/2017  ? Gastroparesis 05/04/2016  ? Anemia 10/05/2015  ? Fall 09/06/2015  ? DKA (diabetic ketoacidoses) 09/06/2015  ? Hyperkalemia 09/05/2015  ? Essential hypertension 12/20/2013  ? Diabetes mellitus ([place]) 11/19/2013  ? Other hyperlipidemia 11/19/2013  ? Central retinal artery occlusion of right eye 11/05/2013  ? ? ?Conditions to be addressed/monitored: Blindness, Acute Renal Failure, Stage III Chronic Kidney Disease, Type II Diabetes Mellitus, Acute Respiratory Failure with Hypoxia, Depressive Symptoms, and Cerebral Vascular Accident.  Limited Social Support and Teacher, English as a foreign language of [employer]. ? ?Care Plan : LCSW Plan of Care  ?Updates made by Adrienne Gaines, LCSW since 05/29/2021 12:00 AM  ?  ? ?Problem: Reduce and Manage Symptoms of Depression and Enhance Quality of Life.   ?Priority: High  ?  ? ?Long-Range Goal: Reduce and Manage Symptoms of Depression and Enhance Quality of Life.   ?Start Date: 01/04/2021  ?Expected End Date: 07/13/2021  ?This Visit's Progress: On track  ?Recent Progress: On track  ?Priority: High  ?Note:   ?Current Barriers:   ?Acute Mental Health Needs related to Blindness, Acute Renal Failure, Stage III Chronic Kidney Disease, Type II Diabetes Mellitus, Acute Respiratory Failure with Hypoxia, Depressive Symptoms, and Cerebral Vascular Accident, requires Support, Education, Resources, Referrals, Advocacy, and Care Coordination, in order to meet Unmet Acute Mental Health Needs. ?Financial Constraints related to Limited Income. ?Limited Social and Family Support, and Limited Access to Caregiver. ?ADL/IADL Limitations, and Inability to Consistently Administer Prescription Medications. ?Clinical Goal(s):  ?Patient will work with LCSW,  in an effort to reduce and manage symptoms of Depression, until well-established with a community mental health provider.       ?Patient will increase knowledge and/or ability of:  ?      Coping Skills, Healthy Habits, Self-Management Skills, Stress Reduction, Home Safety, and Utilizing [employer]s and Resources.   ?Interventions: ?Collaboration with Primary Care Physician, Dr. Charlott Rakes regarding development and update of comprehensive plan of care as evidenced by provider attestation and co-signature. ?Inter-disciplinary care team collaboration (see longitudinal plan of care). ?Clinical Interventions:  ?Mindfulness Meditation Strategies, Relaxation Techniques, and Deep Breathing Exercises encouraged daily. ?Solution-Focused Therapy performed. ?Cognitive Behavioral Therapy initiated. ?Client-Centered Therapy developed.   ?Verbalization of Feelings emphasized. ?Emotional Support provided.   ?Patient Goals/Self-Care Activities: ?Continue to receive personal counseling with LCSW, on a bi-weekly basis, to reduce and manage symptoms of Depression, until well-controlled.    ?Continue to receive weekly virtual (TTC) Timed and Targeted Counseling sessions, in an effort to reduce and manage symptoms of Depression.  ?Continue to attend VIP ([employer]) Support Group, held on the second Friday of every month, from 10:00 am - 1:00 pm, for fellowship, support, community activities, socialization, etc. ?Reviewed Advanced Directives (Living Will and [place]) documents, and assisted with completion.   ?Contact LCSW  directly (# [phone]), if you have questions, need assistance, or if additional social work needs are identified between now and our next scheduled telephone outreach call.  ?Follow-Up Date:  06/01/2021 at 2:15 pm ?  ?Adrienne [name], BSW, MSW, LCSW  ?Licensed Clinical Social Worker  ?Early Management ?[facility]  ?Mailing Address-[address], [place], [place] [address] ?Physical Address-[address], [place], [place] [address] ?Toll Free Main # [phone] ?Fax # [phone] ?Cell # [phone] ?[email or web address] ? ? ?

## 2021-06-01 ENCOUNTER — Telehealth: Payer: Self-pay | Admitting: Family Medicine

## 2021-06-01 ENCOUNTER — Other Ambulatory Visit: Payer: Self-pay | Admitting: *Deleted

## 2021-06-01 NOTE — Telephone Encounter (Signed)
Copied from [place] [phone]. Topic: General - Other ?>> Jun 01, 2021 10:33 AM McGill, Nelva Bush wrote: ?Reason for CRM: [facility] is requesting patients' latest visit notes and prescriptions. ? ?Fax- [phone] ?

## 2021-06-02 NOTE — Patient Outreach (Signed)
Care Management ?Clinical Social Work Note ? ?06/02/2021 ?Name: Adrienne [name] MRN: [id number] DOB: 1955-03-23 ? ?Adrienne [name] is a 66 y.o. year old female who is a primary care patient of Adrienne Rakes, MD.  The Care Management team was consulted for assistance with chronic disease management and coordination needs. ? ?Engaged with patient by telephone for follow up visit in response to provider referral for social work chronic care management and care coordination services ? ?Consent to Services:  ?Adrienne [name] was given information about Care Management services today including:  ?Care Management services includes personalized support from designated clinical staff supervised by her physician, including individualized plan of care and coordination with other care providers ?24/7 contact phone numbers for assistance for urgent and routine care needs. ?The patient may stop case management services at any time by phone call to the office staff. ? ?Patient agreed to services and consent obtained.  ? ?Assessment: Review of patient past medical history, allergies, medications, and health status, including review of relevant consultants reports was performed today as part of a comprehensive evaluation and provision of chronic care management and care coordination services. ? ?SDOH (Social Determinants of Health) assessments and interventions performed:   ? ?Advanced Directives Status: Not addressed in this encounter. ? ?Care Plan ? ?Allergies  ?Allergen Reactions  ? Codeine Itching  ? Hydrocodone Itching  ? Oxycodone Itching  ? Penicillins Itching  ? Clindamycin/Lincomycin Itching  ? Niacin And Related Itching  ? Penicillin G Itching  ? Niacin Itching and Anxiety  ?  Facial flushing  ? ? ?Outpatient Encounter Medications as of 06/01/2021  ?Medication Sig Note  ? acetaminophen (TYLENOL) 500 MG tablet Take 500 mg by mouth every 6 (six) hours as needed for moderate pain.   ? aspirin EC 81 MG tablet Take 81 mg by  mouth daily.    ? atorvastatin (LIPITOR) 80 MG tablet TAKE 1 TABLET (80 MG TOTAL) BY MOUTH DAILY.   ? cetirizine (ZYRTEC) 10 MG tablet Take 1 tablet (10 mg total) by mouth daily.   ? cholecalciferol (VITAMIN D3) 25 MCG (1000 UNIT) tablet Take 1 tablet (1,000 Units total) by mouth daily.   ? clopidogrel (PLAVIX) 75 MG tablet TAKE 1 TABLET (75 MG TOTAL) BY MOUTH DAILY.   ? Continuous Blood Gluc Sensor (FREESTYLE LIBRE 14 DAY SENSOR) MISC PLACE ONE SENSOR AND USE TO CHECK BLOOD SUGAR FOR 14 DAYS THEN REMOVE AND REPLACE. 04/03/2019: Waiting on new sensor.  ? diclofenac Sodium (VOLTAREN) 1 % GEL Apply 4 g topically 4 (four) times daily.   ? famotidine (PEPCID) 20 MG tablet Take 1 tablet (20 mg total) by mouth 2 (two) times daily.   ? fluticasone (FLONASE) 50 MCG/ACT nasal spray PLACE 2 SPRAYS INTO BOTH NOSTRILS DAILY.   ? guaiFENesin-dextromethorphan (ROBITUSSIN DM) 100-10 MG/5ML syrup Take 5 mLs by mouth every 6 (six) hours as needed for cough.   ? insulin glargine (LANTUS) 100 UNIT/ML Solostar Pen Inject 30 Units into the skin 2 (two) times daily.   ? Insulin Pen Needle 31G X 5 MM MISC use as directed 4 (four) times daily - after meals and at bedtime.   ? losartan-hydrochlorothiazide (HYZAAR) 100-25 MG tablet TAKE 1 TABLET BY MOUTH DAILY.   ? metFORMIN (GLUCOPHAGE) 500 MG tablet Take 1 tablet (500 mg total) by mouth 2 (two) times daily with a meal.   ? metoCLOPramide (REGLAN) 5 MG tablet Take 1 tablet (5 mg total) by mouth every 8 (eight) hours as needed for  nausea.   ? Misc. Devices MISC Blood pressure monitor. Dx: Hypertension   ? ?No facility-administered encounter medications on file as of 06/01/2021.  ? ? ?Patient Active Problem List  ? Diagnosis Date Noted  ? Pneumonia due to COVID-19 virus 02/15/2019  ? CKD (chronic kidney disease) stage 3, GFR 30-59 ml/min (HCC) 02/15/2019  ? CVA (cerebral vascular accident) ([place]) 02/15/2019  ? Acute respiratory failure with hypoxia ([place]) 02/14/2019  ? Acute respiratory failure  due to COVID-19 [facility]) 02/14/2019  ? ARF (acute renal failure) ([place]) 02/14/2019  ? DM (diabetes mellitus), type 2 with renal complications ([place]) 50/56/9794  ? GERD (gastroesophageal reflux disease) 01/03/2018  ? Blindness 10/04/2017  ? Vitamin D deficiency 08/24/2017  ? Gastroparesis 05/04/2016  ? Anemia 10/05/2015  ? Fall 09/06/2015  ? DKA (diabetic ketoacidoses) 09/06/2015  ? Hyperkalemia 09/05/2015  ? Essential hypertension 12/20/2013  ? Diabetes mellitus ([place]) 11/19/2013  ? Other hyperlipidemia 11/19/2013  ? Central retinal artery occlusion of right eye 11/05/2013  ? ? ?Conditions to be addressed/monitored: Blindness, Acute Renal Failure, Stage III Chronic Kidney Disease, Type II Diabetes Mellitus, Acute Respiratory Failure with Hypoxia, Depressive Symptoms, and Cerebral Vascular Accident.  Limited Social Support, Level of Care Concerns, ADL/IADL Limitations, Mental Health Concerns, Family and Relationship Dysfunction, Social Isolation, Limited Access to Caregiver, and Lacks Knowledge of [employer]. ? ?Care Plan : LCSW Plan of Care  ?Updates made by Adrienne Gaines, LCSW since 06/02/2021 12:00 AM  ?  ? ?Problem: Reduce and Manage Symptoms of Depression and Enhance Quality of Life.   ?Priority: High  ?  ? ?Long-Range Goal: Reduce and Manage Symptoms of Depression and Enhance Quality of Life.   ?Start Date: 01/04/2021  ?Expected End Date: 07/13/2021  ?This Visit's Progress: On track  ?Recent Progress: On track  ?Priority: High  ?Note:   ?Current Barriers:   ?Acute Mental Health Needs related to Blindness, Acute Renal Failure, Stage III Chronic Kidney Disease, Type II Diabetes Mellitus, Acute Respiratory Failure with Hypoxia, Depressive Symptoms, and Cerebral Vascular Accident, requires Support, Education, Resources, Referrals, Advocacy, and Care Coordination, in order to meet Unmet Acute Mental Health Needs. ?Financial Constraints related to Limited Income. ?Limited Social and Family Support, and Limited  Access to Caregiver. ?ADL/IADL Limitations, and Inability to Consistently Administer Prescription Medications. ?Clinical Goal(s):  ?Patient will work with LCSW, in an effort to reduce and manage symptoms of Depression, until well-established with a community mental health provider.       ?Patient will increase knowledge and/or ability of:  ?      Coping Skills, Healthy Habits, Self-Management Skills, Stress Reduction, Home Safety, and Utilizing [employer] and Resources.   ?Interventions: ?Collaboration with Primary Care Physician, Dr. Charlott [name] regarding development and update of comprehensive plan of care as evidenced by provider attestation and co-signature. ?Inter-disciplinary care team collaboration (see longitudinal plan of care). ?Clinical Interventions:  ?Solution-Focused Therapy performed. ?Cognitive Behavioral Therapy initiated. ?Client-Centered Therapy developed.   ?Verbalization of Feelings emphasized. ?Emotional Support provided.   ?Patient Goals/Self-Care Activities: ?Continue to receive personal counseling with LCSW, on a bi-weekly basis, to reduce and manage symptoms of Depression, until well-controlled.    ?Review of Escrow Account Disclosure Statement from [place], regarding new amount due each month, as a result of property value increase.  ?Collaboration with Customer Service representative with [facility] (# [phone]), to discuss mortgage payment options.   ?Contact LCSW directly (# [phone]), if you have questions, need assistance, or if additional social work needs are identified between now  and our next scheduled telephone outreach call.  ?Follow-Up Date:  06/17/2021 at 9:45 am ?  ?[name], BSW, MSW, LCSW  ?Licensed Clinical Social Worker  ?[facility]  ?Mailing Address-[address] [place], [place] [address] ?Physical Address-[address], [place], [place] [address] ?Toll Free Main # [phone] ?Fax #  [phone] ?Cell # [phone] ?[email or web address] ? ? ? ? ? ?  ?

## 2021-06-03 ENCOUNTER — Telehealth: Payer: Self-pay | Admitting: Family Medicine

## 2021-06-03 DIAGNOSIS — Z794 Long term (current) use of insulin: Secondary | ICD-10-CM

## 2021-06-03 MED ORDER — FREESTYLE LIBRE 2 SENSOR MISC: 3 refills | Status: DC

## 2021-06-03 MED ORDER — FREESTYLE LIBRE 2 READER DEVI: 0 refills | Status: DC

## 2021-06-03 NOTE — Telephone Encounter (Signed)
Copied from [place] [phone]. Topic: General - Other >> Jun 03, 2021 10:39 AM Yvette Rack wrote: Reason for CRM: Pt stated she was told to contact pcp office for a Rx for Continuous Blood Gluc Sensor (FREESTYLE LIBRE 14 DAY SENSOR) MISC. Pt stated the Rx needs to be sent to [facility]. Pt requests call back asap to advise

## 2021-06-03 NOTE — Telephone Encounter (Signed)
Copied from [place] [phone]. Topic: General - Other >> Jun 03, 2021 10:39 AM Yvette Rack wrote: Reason for CRM: Pt stated she was told to contact pcp office for a Rx for Continuous Blood Gluc Sensor (FREESTYLE LIBRE 14 DAY SENSOR) MISC. Pt stated the Rx needs to be sent to [facility]. Pt requests call back asap to advise

## 2021-06-03 NOTE — Telephone Encounter (Signed)
Rxs sent to [facility].

## 2021-06-03 NOTE — Telephone Encounter (Signed)
Faxed order request to [facility] fax # [phone] in regards to the below, please note when received.

## 2021-06-03 NOTE — Telephone Encounter (Unsigned)
Copied from [place] [phone]. Topic: General - Other >> Jun 03, 2021 10:39 AM Yvette Rack wrote: Reason for CRM: Pt stated she was told to contact pcp office for a Rx for Continuous Blood Gluc Sensor (FREESTYLE LIBRE 14 DAY SENSOR) MISC. Pt stated the Rx needs to be sent to [facility]. Pt requests call back asap to advise

## 2021-06-08 DIAGNOSIS — E119 Type 2 diabetes mellitus without complications: Secondary | ICD-10-CM | POA: Diagnosis not present

## 2021-06-08 DIAGNOSIS — Z794 Long term (current) use of insulin: Secondary | ICD-10-CM | POA: Diagnosis not present

## 2021-06-17 ENCOUNTER — Other Ambulatory Visit: Payer: Self-pay | Admitting: *Deleted

## 2021-06-17 NOTE — Patient Outreach (Signed)
Care Management Clinical Social Work Note  06/17/2021 Name: Adrienne [name] MRN: [id number] DOB: September 14, 1955  Adrienne [name] is a 66 y.o. year old female who is a primary care patient of Adrienne Rakes, MD.  The Care Management team was consulted for assistance with chronic disease management and coordination needs.  Engaged with patient by telephone for follow up visit in response to provider referral for social work chronic care management and care coordination services  Consent to Services:  Adrienne [name] was given information about Care Management services today including:  Care Management services includes personalized support from designated clinical staff supervised by her physician, including individualized plan of care and coordination with other care providers 24/7 contact phone numbers for assistance for urgent and routine care needs. The patient may stop case management services at any time by phone call to the office staff.  Patient agreed to services and consent obtained.   Assessment: Review of patient past medical history, allergies, medications, and health status, including review of relevant consultants reports was performed today as part of a comprehensive evaluation and provision of chronic care management and care coordination services.  SDOH (Social Determinants of Health) assessments and interventions performed:    Advanced Directives Status: Not addressed in this encounter.  Care Plan  Allergies  Allergen Reactions   Codeine Itching   Hydrocodone Itching   Oxycodone Itching   Penicillins Itching   Clindamycin/Lincomycin Itching   Niacin And Related Itching   Penicillin G Itching   Niacin Itching and Anxiety    Facial flushing    Outpatient Encounter Medications as of 06/17/2021  Medication Sig   acetaminophen (TYLENOL) 500 MG tablet Take 500 mg by mouth every 6 (six) hours as needed for moderate pain.   aspirin EC 81 MG tablet Take 81 mg by mouth  daily.    atorvastatin (LIPITOR) 80 MG tablet TAKE 1 TABLET (80 MG TOTAL) BY MOUTH DAILY.   cetirizine (ZYRTEC) 10 MG tablet Take 1 tablet (10 mg total) by mouth daily.   cholecalciferol (VITAMIN D3) 25 MCG (1000 UNIT) tablet Take 1 tablet (1,000 Units total) by mouth daily.   clopidogrel (PLAVIX) 75 MG tablet TAKE 1 TABLET (75 MG TOTAL) BY MOUTH DAILY.   Continuous Blood Gluc Receiver (FREESTYLE LIBRE 2 READER) DEVI Please use to check blood sugar three times daily. Adrienne [name]   Continuous Blood Gluc Sensor (FREESTYLE LIBRE 2 SENSOR) MISC Please use to check blood sugar three times daily. Change sensors once every 14 days. Adrienne [name]   diclofenac Sodium (VOLTAREN) 1 % GEL Apply 4 g topically 4 (four) times daily.   famotidine (PEPCID) 20 MG tablet Take 1 tablet (20 mg total) by mouth 2 (two) times daily.   fluticasone (FLONASE) 50 MCG/ACT nasal spray PLACE 2 SPRAYS INTO BOTH NOSTRILS DAILY.   guaiFENesin-dextromethorphan (ROBITUSSIN DM) 100-10 MG/5ML syrup Take 5 mLs by mouth every 6 (six) hours as needed for cough.   insulin glargine (LANTUS) 100 UNIT/ML Solostar Pen Inject 30 Units into the skin 2 (two) times daily.   Insulin Pen Needle 31G X 5 MM MISC use as directed 4 (four) times daily - after meals and at bedtime.   losartan-hydrochlorothiazide (HYZAAR) 100-25 MG tablet TAKE 1 TABLET BY MOUTH DAILY.   metFORMIN (GLUCOPHAGE) 500 MG tablet Take 1 tablet (500 mg total) by mouth 2 (two) times daily with a meal.   metoCLOPramide (REGLAN) 5 MG tablet Take 1 tablet (5 mg total) by mouth every 8 (eight) hours as needed for  nausea.   Misc. Devices MISC Blood pressure monitor. Dx: Hypertension   No facility-administered encounter medications on file as of 06/17/2021.    Patient Active Problem List   Diagnosis Date Noted   Pneumonia due to COVID-19 virus 02/15/2019   CKD (chronic kidney disease) stage 3, GFR 30-59 ml/min ([place]) 02/15/2019   CVA (cerebral vascular accident) ([place]) 02/15/2019   Acute  respiratory failure with hypoxia ([place]) 02/14/2019   Acute respiratory failure due to COVID-19 ([place]) 02/14/2019   ARF (acute renal failure) ([place]) 02/14/2019   DM (diabetes mellitus), type 2 with renal complications ([place]) 40/97/3532   GERD (gastroesophageal reflux disease) 01/03/2018   Blindness 10/04/2017   Vitamin D deficiency 08/24/2017   Gastroparesis 05/04/2016   Anemia 10/05/2015   Fall 09/06/2015   DKA (diabetic ketoacidoses) 09/06/2015   Hyperkalemia 09/05/2015   Essential hypertension 12/20/2013   Diabetes mellitus ([employer]) 11/19/2013   Other hyperlipidemia 11/19/2013   Central retinal artery occlusion of right eye 11/05/2013    Conditions to be addressed/monitored: Anxiety and Depression.  Film/video editor, Limited Social Support, Level of Care Concerns, ADL/IADL Limitations, Mental Health Concerns, Family and Relationship Dysfunction, Social Isolation, Limited Access to Caregiver, and Lacks Knowledge of [employer].  Care Plan : LCSW Plan of Care  Updates made by Adrienne Gaines, LCSW since 06/17/2021 12:00 AM     Problem: Reduce and Manage Symptoms of Depression and Enhance Quality of Life.   Priority: High     Long-Range Goal: Reduce and Manage Symptoms of Depression and Enhance Quality of Life.   Start Date: 01/04/2021  Expected End Date: 07/13/2021  This Visit's Progress: On track  Recent Progress: On track  Priority: High  Note:   Current Barriers:   Acute Mental Health Needs related to Blindness, Acute Renal Failure, Stage III Chronic Kidney Disease, Type II Diabetes Mellitus, Acute Respiratory Failure with Hypoxia, Depressive Symptoms, and Cerebral Vascular Accident, requires Support, Education, Resources, Referrals, Advocacy, and Care Coordination, in order to meet Unmet Acute Mental Health Needs. Financial Constraints related to Limited Income. Limited Social and Family Support, and Limited Access to Caregiver. ADL/IADL Limitations, and Inability to  Consistently Administer Prescription Medications. Clinical Goal(s):  Patient will work with LCSW, in an effort to reduce and manage symptoms of Depression, until well-established with a community mental health provider.       Patient will increase knowledge and/or ability of:        Coping Skills, Healthy Habits, Self-Management Skills, Stress Reduction, Home Safety, and Utilizing [employer] and Resources.   Interventions: Collaboration with Primary Care Physician, Dr. Charlott [name] regarding development and update of comprehensive plan of care as evidenced by provider attestation and co-signature. Inter-disciplinary care team collaboration (see longitudinal plan of care). Clinical Interventions:  Solution-Focused Therapy performed. Cognitive Behavioral Therapy initiated. Client-Centered Therapy developed.   Verbalization of Feelings emphasized. Emotional Support provided.   Patient Goals/Self-Care Activities: Continue to receive personal counseling with LCSW, on a bi-weekly basis, to reduce and manage symptoms of Depression, until well-controlled.    Review of Escrow Account Disclosure Statement from [place], regarding new amount due each month, as a result of property value increase.   ~ Mortgage increase of $300.00 per month. Collaboration with Customer Service representative with [place] (# [phone]), to discuss mortgage payment options.   ~ Obtain W-2 and tax statements from 2022 and submit to [employer], to receive a 50% tax cut off of monthly mortgage payments, now that you are 66 years of age. Contact  LCSW directly (# [id number]), if you have questions, need assistance, or if additional social work needs are identified between now and our next scheduled telephone outreach call.  Follow-Up Date:  07/01/2021 at 9:45 am   [name], BSW, MSW, [place]  Licensed Clinical Social Worker  [place]   [place] [place] [address], [place], [place] [address] Physical Address-[address], [place], [place] [address] Toll Free Main # [phone] Fax # [phone] Cell # [phone] [email or web address]

## 2021-06-21 ENCOUNTER — Encounter: Payer: Self-pay | Admitting: *Deleted

## 2021-07-01 ENCOUNTER — Ambulatory Visit: Payer: Medicare Other | Admitting: *Deleted

## 2021-07-09 ENCOUNTER — Ambulatory Visit: Payer: Self-pay

## 2021-07-09 ENCOUNTER — Other Ambulatory Visit: Payer: Self-pay | Admitting: *Deleted

## 2021-07-09 NOTE — Telephone Encounter (Signed)
Yes because it is the new bivalent vaccine which she has not had before.

## 2021-07-11 ENCOUNTER — Encounter: Payer: Self-pay | Admitting: *Deleted

## 2021-07-13 ENCOUNTER — Other Ambulatory Visit (HOSPITAL_COMMUNITY): Payer: Self-pay

## 2021-07-22 ENCOUNTER — Other Ambulatory Visit: Payer: Self-pay | Admitting: *Deleted

## 2021-07-22 NOTE — Patient Outreach (Signed)
[facility]) Care Management [place] Note   07/22/2021 Name:  Adrienne [name] MRN:  [id number] DOB:  Jan 20, 1955  Summary: Patient has not monitored her blood sugar today. RN discussed importance of monitoring  4-5 times a day. Patient started a routine exercise program of stretches leg kicks, walking and steps.   Recommendations/Changes made from today's visit: Plan: Patient will monitor blood sugars at least 3-4 times a day. Patient will continue her exercise routine. Patient gave RN Health coach OTC ordered and called in.     Subjective: Adrienne [name] is an 66 y.o. year old female who is a primary patient of Charlott Rakes, MD. The care management team was consulted for assistance with care management and/or care coordination needs.    RN Health Coach completed Telephone Visit today.   Objective:  Medications Reviewed Today     Reviewed by Francis Gaines, LCSW (Social Worker) on 07/11/21 at [place] List Status: <None>   Medication Order Taking? Sig Documenting Provider Last Dose Status Informant  acetaminophen (TYLENOL) 500 MG tablet [id number] No Take 500 mg by mouth every 6 (six) hours as needed for moderate pain. [provider] Taking Active Self  aspirin EC 81 MG tablet [id number] No Take 81 mg by mouth daily.  [provider] Taking Active Self           Med Note [place] Silos, AMY C   Sat Sep 05, 2015 11:06 PM)    atorvastatin (LIPITOR) 80 MG tablet [id number]  TAKE 1 TABLET (80 MG TOTAL) BY MOUTH DAILY. Charlott Rakes, MD  Active   cetirizine (ZYRTEC) 10 MG tablet [id number] No Take 1 tablet (10 mg total) by mouth daily. Charlott Rakes, MD Taking Active   cholecalciferol (VITAMIN D3) 25 MCG (1000 UNIT) tablet [id number] No Take 1 tablet (1,000 Units total) by mouth daily. Charlott Rakes, MD Taking Active   clopidogrel (PLAVIX) 75 MG tablet [id number]  TAKE 1 TABLET (75 MG TOTAL) BY MOUTH DAILY. Charlott Rakes, MD  Active    Continuous Blood Gluc Receiver (FREESTYLE LIBRE 2 READER) DEVI [id number]  Please use to check blood sugar three times daily. E11.69 Charlott Rakes, MD  Active   Continuous Blood Gluc Sensor (FREESTYLE LIBRE 2 SENSOR) MISC [id number]  Please use to check blood sugar three times daily. Change sensors once every 14 days. E11.69 Charlott Rakes, MD  Active   diclofenac Sodium (VOLTAREN) 1 % GEL [id number] No Apply 4 g topically 4 (four) times daily. Charlott Rakes, MD Taking Active   famotidine (PEPCID) 20 MG tablet [id number] No Take 1 tablet (20 mg total) by mouth 2 (two) times daily. Charlott Rakes, MD Taking Active Self  fluticasone (FLONASE) 50 MCG/ACT nasal spray [id number]  PLACE 2 SPRAYS INTO BOTH NOSTRILS DAILY. Charlott Rakes, MD  Active   guaiFENesin-dextromethorphan [facility] DM) 100-10 MG/5ML syrup [id number] No Take 5 mLs by mouth every 6 (six) hours as needed for cough. Arrien, Jimmy Picket, MD Taking Active   insulin glargine (LANTUS) 100 UNIT/ML Solostar Pen [id number] No Inject 30 Units into the skin 2 (two) times daily. Charlott Rakes, MD Taking Active   Insulin Pen Needle 31G X 5 MM MISC [id number] No use as directed 4 (four) times daily - after meals and at bedtime. Charlott Rakes, MD Taking Active   losartan-hydrochlorothiazide (HYZAAR) 100-25 MG tablet [id number]  TAKE 1 TABLET BY MOUTH DAILY. Charlott Rakes, MD  Active   metFORMIN (GLUCOPHAGE) 500 MG tablet [id number]  Take 1 tablet (  500 mg total) by mouth 2 (two) times daily with a meal. Charlott Rakes, MD  Active   metoCLOPramide (REGLAN) 5 MG tablet [id number] No Take 1 tablet (5 mg total) by mouth every 8 (eight) hours as needed for nausea. Charlott Rakes, MD Taking Active   Misc. Devices [place] [id number] No Blood pressure monitor. Dx: Hypertension Charlott Rakes, MD Taking Active Self             SDOH:  (Social Determinants of Health) assessments and interventions performed:    Care Plan  Review of patient past  medical history, allergies, medications, health status, including review of consultants reports, laboratory and other test data, was performed as part of comprehensive evaluation for care management services.   Care Plan : RN Care Manager Plan of Care  Updates made by Giliana Vantil, Eppie Gibson, RN since 07/22/2021 12:00 AM     Problem: Knowledge Deficit Related to Diabetes and Care Coordination Needs   Priority: High     Long-Range Goal: Development Plan of Care fro Management of Diabetes   Start Date: 01/15/2021  Expected End Date: 01/15/2022  Priority: High  Note:   Current Barriers:  Knowledge Deficits related to plan of care for management of DMII   RNCM Clinical Goal(s):  Patient will verbalize understanding of plan for management of DMII as evidenced by continuation of monitoring blood sugars and adhering to diabetic diet  through collaboration with RN Care manager, provider, and care team.   Interventions: Inter-disciplinary care team collaboration (see longitudinal plan of care) Evaluation of current treatment plan related to  self management and patient's adherence to plan as established by provider  Patient Goals/Self-Care Activities: Take medications as prescribed   Attend all scheduled provider appointments Call pharmacy for medication refills 3-7 days in advance of running out of medications Call provider office for new concerns or questions  Work with the social worker to address care coordination needs and will continue to work with the clinical team to address health care and disease management related needs call the Suicide and Crisis Lifeline: 988 if experiencing a Mental Health or [place]  schedule appointment with eye doctor check blood sugar at prescribed times: 4 times daily take the blood sugar meter to all doctor visits drink 6 to 8 glasses of water each day Start walking exercise routine RN Health coach to call in [facility] OTC list medications  in  [id number] Patient had not monitored her blood sugar this morning. She ate a bowl of cereal and banana and 2 strawberries for breakfast. Her A1C is 10.5 down from 12.9.  She has not started an exercise routine. Per patient she now has someone that has volunteered to start walking with her. Patient has not been able to order her OTC [employer] products due to blindness. RN reviewed all products and called the order in.   [id number] Patient has not monitored her blood sugar today. RN discussed the importance of monitoring it 4-5 times a day. Patient has started a routine exercise program of stretches leg kicks, walking and steps. Plan: Patient will monitor blood sugars at least 3-4 times a day. Patient will continue her exercise routine. Patient gave RN Health coach OTC ordered and called in.        Plan: Telephone follow up appointment with care management team member scheduled for:  Sept 5, 2023 The patient has been provided with contact information for the care management team and has been advised to call with any health related questions  or concerns.   [facility] [phone]

## 2021-08-02 ENCOUNTER — Other Ambulatory Visit: Payer: Self-pay

## 2021-08-02 ENCOUNTER — Ambulatory Visit: Payer: Medicare Other | Attending: Family Medicine | Admitting: Family Medicine

## 2021-08-02 ENCOUNTER — Other Ambulatory Visit (HOSPITAL_COMMUNITY): Payer: Self-pay

## 2021-08-02 ENCOUNTER — Encounter: Payer: Self-pay | Admitting: Family Medicine

## 2021-08-02 VITALS — BP 136/81 | HR 85 | Temp 98.3°F | Ht 66.0 in | Wt 203.6 lb

## 2021-08-02 DIAGNOSIS — E785 Hyperlipidemia, unspecified: Secondary | ICD-10-CM | POA: Diagnosis not present

## 2021-08-02 DIAGNOSIS — M25512 Pain in left shoulder: Secondary | ICD-10-CM | POA: Diagnosis not present

## 2021-08-02 DIAGNOSIS — Z1211 Encounter for screening for malignant neoplasm of colon: Secondary | ICD-10-CM

## 2021-08-02 DIAGNOSIS — I129 Hypertensive chronic kidney disease with stage 1 through stage 4 chronic kidney disease, or unspecified chronic kidney disease: Secondary | ICD-10-CM

## 2021-08-02 DIAGNOSIS — E1169 Type 2 diabetes mellitus with other specified complication: Secondary | ICD-10-CM

## 2021-08-02 DIAGNOSIS — G8929 Other chronic pain: Secondary | ICD-10-CM | POA: Diagnosis not present

## 2021-08-02 DIAGNOSIS — Z794 Long term (current) use of insulin: Secondary | ICD-10-CM | POA: Diagnosis not present

## 2021-08-02 DIAGNOSIS — R053 Chronic cough: Secondary | ICD-10-CM | POA: Diagnosis not present

## 2021-08-02 DIAGNOSIS — E1122 Type 2 diabetes mellitus with diabetic chronic kidney disease: Secondary | ICD-10-CM

## 2021-08-02 DIAGNOSIS — N1832 Chronic kidney disease, stage 3b: Secondary | ICD-10-CM

## 2021-08-02 LAB — POCT GLYCOSYLATED HEMOGLOBIN (HGB A1C): HbA1c, POC (controlled diabetic range): 10.6 % — AB (ref 0.0–7.0)

## 2021-08-02 LAB — GLUCOSE, POCT (MANUAL RESULT ENTRY): POC Glucose: 305 mg/dl — AB (ref 70–99)

## 2021-08-02 MED ORDER — LOSARTAN POTASSIUM-HCTZ 100-25 MG PO TABS
1.0000 | ORAL_TABLET | Freq: Every day | ORAL | 1 refills | Status: DC
  Filled 2021-08-02 – 2021-10-04 (×2): qty 90, 90d supply, fill #0
  Filled 2021-12-21: qty 90, 90d supply, fill #1

## 2021-08-02 MED ORDER — ATORVASTATIN CALCIUM 80 MG PO TABS
80.0000 mg | ORAL_TABLET | Freq: Every day | ORAL | 1 refills | Status: DC
  Filled 2021-08-02: qty 90, fill #0
  Filled 2021-10-04 – 2021-10-11 (×2): qty 90, 90d supply, fill #0
  Filled 2021-12-21: qty 90, 90d supply, fill #1

## 2021-08-02 MED ORDER — CETIRIZINE HCL 10 MG PO TABS
10.0000 mg | ORAL_TABLET | Freq: Every day | ORAL | 1 refills | Status: AC
  Filled 2021-08-02: qty 30, 30d supply, fill #0

## 2021-08-02 MED ORDER — INSULIN GLARGINE 100 UNIT/ML SOLOSTAR PEN: PEN_INJECTOR | SUBCUTANEOUS | 6 refills | Status: DC

## 2021-08-02 NOTE — Progress Notes (Signed)
Subjective:  Patient ID: Adrienne [name], female    DOB: 10/01/1955  Age: 66 y.o. MRN: [id number]  CC: Diabetes   HPI Adrienne [name] is a 66 y.o. 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  Interval History: She has had left shoulder pain which flares up intermittently and sometimes has to maneuver to put on her clothes.  Symptoms are absent at the moment and she denies numbness or tingling down her arms.  She has had low blood sugars when she eats 'right' and so she has to eat chocolates to bring her blood sugars.  Most of the time her sugars are elevated.  Low blood sugars usually occur in the early hours of the morning.  She does not give me her blood sugar readings but informs me that her daughter is able to connect her glucometer to her phone to have her blood sugars being read to her. Endorses adherence with her statin and antihypertensive.  She took a fall a few weeks ago and she thought she tripped over her laundry basket and feels face down.  She did have a bruise on her left side of her face which has not resolved.  Denies nausea, vomiting, headaches, altered sensorium. Past Medical History:  Diagnosis Date   Blindness of both eyes    Diabetes ([place])    High cholesterol    Hypertension     Past Surgical History:  Procedure Laterality Date   CATARACT EXTRACTION, BILATERAL     Laser eye surgery, cataract surgery bi-lat   CESAREAN SECTION     Twins, Breech   EYE SURGERY Bilateral 2012   LASER, CATARACT   TEE WITHOUT CARDIOVERSION N/A 12/31/2013   Procedure: TRANSESOPHAGEAL ECHOCARDIOGRAM (TEE);  Surgeon: Laverda Page, MD;   Location: [facility];  Service: Cardiovascular;  Laterality: N/A;  H&P in file   TUBAL LIGATION Bilateral     Family History  Problem Relation Age of Onset   Dementia Mother    Diabetes Father    Hypertension Father    Hypertension Sister    Diabetes Sister    Diabetes Sister    Heart disease Sister     Social History   Socioeconomic History   Marital status: Divorced    Spouse name: Not on file   Number of children: 3   Years of education: 12   Highest education level: 12th grade  Occupational History    Employer: [employer]  Tobacco Use   Smoking status: Former    Passive exposure: Past   Smokeless tobacco: Never   Tobacco comments:    QUIT IN 1987  Vaping Use   Vaping Use: Never used  Substance and Sexual Activity   Alcohol use: No    Alcohol/week: 0.0 standard drinks of alcohol    Comment: QUIT IN 1987   Drug use: No    Frequency: 2.0 times per week    Comment: QUIT 1980   Sexual activity: Not Currently  Other Topics Concern   Not on file  Social History Narrative   Patient is divorced with 3 children.   Patient is right handed.   Patient has 12 th grade education.   Patient  has not been drinking caffeine.   Social Determinants of Health   Financial Resource Strain: Medium Risk (03/01/2021)   Overall Financial Resource Strain (CARDIA)    Difficulty of Paying Living Expenses: Somewhat hard  Food Insecurity: No Food Insecurity (03/01/2021)   Hunger Vital Sign    Worried About Running Out of Food in the Last Year: Never true    Ran Out of Food in the Last Year: Never true  Transportation Needs: No Transportation Needs (03/01/2021)   PRAPARE - Hydrologist (Medical): No    Lack of Transportation (Non-Medical): No  Physical Activity: Inactive (03/01/2021)   Exercise Vital Sign    Days of Exercise per Week: 0 days    Minutes of Exercise per Session: 0 min  Stress: No Stress Concern Present (03/01/2021)   [place]    Feeling of Stress : Only a little  Social Connections: Moderately Integrated (03/01/2021)   Social Connection and Isolation Panel [NHANES]    Frequency of Communication with Friends and Family: More than three times a week    Frequency of Social Gatherings with Friends and Family: More than three times a week    Attends Religious Services: More than 4 times per year    Active Member of [employer] or Organizations: Yes    Attends Music therapist: More than 4 times per year    Marital Status: Divorced    Allergies  Allergen Reactions   Codeine Itching   Hydrocodone Itching   Oxycodone Itching   Penicillins Itching   Clindamycin/Lincomycin Itching   Niacin And Related Itching   Penicillin G Itching   Niacin Itching and Anxiety    Facial flushing    Outpatient Medications Prior to Visit  Medication Sig Dispense Refill   acetaminophen (TYLENOL) 500 MG tablet Take 500 mg by mouth every 6 (six) hours as needed for moderate pain.     aspirin EC 81 MG tablet Take 81 mg by mouth daily.      cholecalciferol (VITAMIN D3) 25 MCG (1000 UNIT) tablet Take 1 tablet (1,000 Units total) by mouth daily. 30 tablet 3   clopidogrel (PLAVIX) 75 MG tablet TAKE 1 TABLET (75 MG TOTAL) BY MOUTH DAILY. 90 tablet 1   Continuous Blood Gluc Receiver (FREESTYLE LIBRE 2 READER) DEVI Please use to check blood sugar three times daily. E11.69 1 each 0   Continuous Blood Gluc Sensor (FREESTYLE LIBRE 2 SENSOR) MISC Please use to check blood sugar three times daily. Change sensors once every 14 days. E11.69 2 each 3   diclofenac Sodium (VOLTAREN) 1 % GEL Apply 4 g topically 4 (four) times daily. 100 g 1   famotidine (PEPCID) 20 MG tablet Take 1 tablet (20 mg total) by mouth 2 (two) times daily. 180 tablet 1   fluticasone (FLONASE) 50 MCG/ACT nasal spray PLACE 2 SPRAYS INTO BOTH NOSTRILS DAILY. 16 g 2   guaiFENesin-dextromethorphan  (ROBITUSSIN DM) 100-10 MG/5ML syrup Take 5 mLs by mouth every 6 (six) hours as needed for cough. 118 mL 0   Insulin Pen Needle 31G X 5 MM MISC use as directed 4 (four) times daily - after meals and at bedtime. 100 each 3   metFORMIN (GLUCOPHAGE) 500 MG tablet Take 1 tablet (500 mg total) by mouth 2 (two) times daily with a meal. 180 tablet 1   metoCLOPramide (REGLAN) 5 MG tablet Take 1 tablet (5 mg total) by mouth every 8 (  eight) hours as needed for nausea. 90 tablet 1   Misc. Devices MISC Blood pressure monitor. Dx: Hypertension 1 each 0   atorvastatin (LIPITOR) 80 MG tablet TAKE 1 TABLET (80 MG TOTAL) BY MOUTH DAILY. 90 tablet 1   cetirizine (ZYRTEC) 10 MG tablet Take 1 tablet (10 mg total) by mouth daily. 30 tablet 1   insulin glargine (LANTUS) 100 UNIT/ML Solostar Pen Inject 30 Units into the skin 2 (two) times daily. 30 mL 6   losartan-hydrochlorothiazide (HYZAAR) 100-25 MG tablet TAKE 1 TABLET BY MOUTH DAILY. 90 tablet 1   No facility-administered medications prior to visit.     ROS Review of Systems  Constitutional:  Negative for activity change and appetite change.  HENT:  Negative for sinus pressure and sore throat.   Eyes:  Positive for visual disturbance.  Respiratory:  Negative for chest tightness, shortness of breath and wheezing.   Cardiovascular:  Negative for chest pain and palpitations.  Gastrointestinal:  Negative for abdominal distention, abdominal pain and constipation.  Genitourinary: Negative.   Musculoskeletal:        See HPI  Psychiatric/Behavioral:  Negative for behavioral problems and dysphoric mood.     Objective:  BP 136/81   Pulse 85   Temp 98.3 F (36.8 C) (Oral)   Ht _0  (1.676 m)   Wt 203 lb 9.6 oz (92.4 kg)   SpO2 100%   BMI 32.86 kg/m      08/02/2021    2:59 PM 04/20/2021    3:19 PM 01/19/2021    3:22 PM  BP/Weight  Systolic BP 371 062 694  Diastolic BP 81 80 79  Wt. (Lbs) 203.6 196   BMI 32.86 kg/m2 31.64 kg/m2       Physical  Exam Constitutional:      Appearance: She is well-developed.  Cardiovascular:     Rate and Rhythm: Normal rate.     Heart sounds: Normal heart sounds. No murmur heard. Pulmonary:     Effort: Pulmonary effort is normal.     Breath sounds: Normal breath sounds. No wheezing or rales.  Chest:     Chest wall: No tenderness.  Abdominal:     General: Bowel sounds are normal. There is no distension.     Palpations: Abdomen is soft. There is no mass.     Tenderness: There is no abdominal tenderness.  Musculoskeletal:        General: Normal range of motion.     Right lower leg: No edema.     Left lower leg: No edema.     Comments: No tenderness on palpation of both shoulder regions  Neurological:     Mental Status: She is alert and oriented to person, place, and time.  Psychiatric:        Mood and Affect: Mood normal.        Latest Ref Rng & Units 04/20/2021    4:07 PM 01/19/2021    4:01 PM 10/19/2020    4:49 PM  CMP  Glucose 70 - 99 mg/dL 225  383  226   BUN 8 - 27 mg/dL 35  19  20   Creatinine 0.57 - 1.00 mg/dL 1.79  1.66  1.63   Sodium 134 - 144 mmol/L 139  137  138   Potassium 3.5 - 5.2 mmol/L 4.2  4.5  4.4   Chloride 96 - 106 mmol/L 100  99  99   CO2 20 - 29 mmol/L 22  21  21  Calcium 8.7 - 10.3 mg/dL 10.0  9.8  10.1   Total Protein 6.0 - 8.5 g/dL 6.8  7.2  7.5   Total Bilirubin 0.0 - 1.2 mg/dL 0.3  0.3  0.3   Alkaline Phos 44 - 121 IU/L 101  143  123   AST 0 - 40 IU/L _0 ALT 0 - 32 IU/L _1 Lipid Panel     Component Value Date/Time   CHOL 202 (H) 05/07/2019 1049   TRIG 122 05/07/2019 1049   HDL 46 05/07/2019 1049   CHOLHDL 4.4 05/07/2019 1049   CHOLHDL 2.7 01/04/2016 1138   VLDL 12 01/04/2016 1138   LDLCALC 134 (H) 05/07/2019 1049    CBC    Component Value Date/Time   WBC 9.7 10/19/2020 1649   WBC 9.6 02/13/2019 2030   RBC 3.71 (L) 10/19/2020 1649   RBC 3.14 (L) 02/13/2019 2030   HGB 11.5 10/19/2020 1649   HCT 33.4 (L) 10/19/2020 1649    PLT 508 (H) 10/19/2020 1649   MCV 90 10/19/2020 1649   MCH 31.0 10/19/2020 1649   MCH 30.6 02/13/2019 2030   MCHC 34.4 10/19/2020 1649   MCHC 32.9 02/13/2019 2030   RDW 11.8 10/19/2020 1649   LYMPHSABS 2.7 10/19/2020 1649   MONOABS 0.4 02/13/2019 2030   EOSABS 0.3 10/19/2020 1649   BASOSABS 0.1 10/19/2020 1649    Lab Results  Component Value Date   HGBA1C 10.6 (A) 08/02/2021    Assessment & Plan:  1. Type 2 diabetes mellitus with other specified complication, with long-term current use of insulin (HCC) Uncontrolled with A1c of 10.6 Goal is less than 7.0 The fact that she is visually impaired and lives alone is a major limitation to strict glycemic control along with intermittent hypoglycemia. She is also nonadherent with diabetes diet Advised to increase morning dose of Lantus We might need to discontinue metformin if her GFR drops below 30 Counseled on Diabetic diet, my plate method, 782 minutes of moderate intensity exercise/week Blood sugar logs with fasting goals of 80-120 mg/dl, random of less than 180 and in the event of sugars less than 60 mg/dl or greater than 400 mg/dl encouraged to notify the clinic. Advised on the need for annual eye exams, annual foot exams, Pneumonia vaccine. - POCT glucose (manual entry) - POCT glycosylated hemoglobin (Hb A1C) - CMP14+EGFR - insulin glargine (LANTUS) 100 UNIT/ML Solostar Pen; Inject 30 units in the am and 25 in the evening  Dispense: 30 mL; Refill: 6 - Microalbumin/Creatinine Ratio, Urine  2. Chronic left shoulder pain She states this is not bothersome Advised that I can refer her for PT but she would like to hold off Use Voltaren gel in the meantime  3. Hyperlipidemia associated with type 2 diabetes mellitus ([place]) Not at goal Continue atorvastatin We will need to check lipid panel and adjust regimen accordingly - atorvastatin (LIPITOR) 80 MG tablet; TAKE 1 TABLET (80 MG TOTAL) BY MOUTH DAILY.  Dispense: 90 tablet; Refill:  1  4. Chronic cough - cetirizine (ZYRTEC) 10 MG tablet; Take 1 tablet (10 mg total) by mouth daily.  Dispense: 30 tablet; Refill: 1  5. 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  Dispense: 90 tablet; Refill: 1  6. Stage 3b chronic kidney disease (HCC) Combination of hypertensive and diabetic nephropathy We will  check GFR today  7. Screening for colon cancer - Cologuard   Meds ordered this encounter  Medications   insulin glargine (LANTUS) 100 UNIT/ML Solostar Pen    Sig: Inject 30 units in the am and 25 in the evening    Dispense:  30 mL    Refill:  6    Dose increase   atorvastatin (LIPITOR) 80 MG tablet    Sig: TAKE 1 TABLET (80 MG TOTAL) BY MOUTH DAILY.    Dispense:  90 tablet    Refill:  1   cetirizine (ZYRTEC) 10 MG tablet    Sig: Take 1 tablet (10 mg total) by mouth daily.    Dispense:  30 tablet    Refill:  1   losartan-hydrochlorothiazide (HYZAAR) 100-25 MG tablet    Sig: TAKE 1 TABLET BY MOUTH DAILY.    Dispense:  90 tablet    Refill:  1    Follow-up: Return in about 3 months (around 11/02/2021) for Chronic medical conditions.       Charlott Rakes, MD, FAAFP. [facility] [place], [place]   08/02/2021, 5:18 PM

## 2021-08-02 NOTE — Progress Notes (Signed)
Left shoulder pain.

## 2021-08-03 ENCOUNTER — Other Ambulatory Visit: Payer: Self-pay

## 2021-08-03 ENCOUNTER — Ambulatory Visit: Payer: Medicare Other | Admitting: *Deleted

## 2021-08-03 LAB — CMP14+EGFR
ALT: 19 IU/L (ref 0–32)
AST: 15 IU/L (ref 0–40)
Albumin/Globulin Ratio: 1.6 (ref 1.2–2.2)
Albumin: 4.2 g/dL (ref 3.9–4.9)
Alkaline Phosphatase: 105 IU/L (ref 44–121)
BUN/Creatinine Ratio: 14 (ref 12–28)
BUN: 25 mg/dL (ref 8–27)
Bilirubin Total: <0.2 mg/dL (ref 0.0–1.2)
CO2: 22 mmol/L (ref 20–29)
Calcium: 9.4 mg/dL (ref 8.7–10.3)
Chloride: 100 mmol/L (ref 96–106)
Creatinine, Ser: 1.75 mg/dL — ABNORMAL HIGH (ref 0.57–1.00)
Globulin, Total: 2.7 g/dL (ref 1.5–4.5)
Glucose: 295 mg/dL — ABNORMAL HIGH (ref 70–99)
Potassium: 4.6 mmol/L (ref 3.5–5.2)
Sodium: 138 mmol/L (ref 134–144)
Total Protein: 6.9 g/dL (ref 6.0–8.5)
eGFR: 32 mL/min/{1.73_m2} — ABNORMAL LOW (ref >59)

## 2021-08-12 ENCOUNTER — Other Ambulatory Visit: Payer: Self-pay | Admitting: *Deleted

## 2021-08-12 NOTE — Patient Outreach (Signed)
[facility]) Care Management  08/12/2021  [name] 08/07/1955 [id number]   A1C increased from 10.5 to 10.6. She has a free style libre with the talking phone app. Patient is totally blind and not adherent to diet. She get the meals bought to her and eats mostly what is bought. She is unable to order her [facility] products and needs someone to call them in for her.  She lives alone. RN Health Coach Case Closure. RN refer to Care Coordinator to continue follow up care.  Plan RN Health Coach Case Closure Refer to Care Coordinator  [place] [facility] [phone]

## 2021-08-19 ENCOUNTER — Ambulatory Visit: Payer: Self-pay | Admitting: *Deleted

## 2021-08-19 ENCOUNTER — Encounter: Payer: Self-pay | Admitting: *Deleted

## 2021-08-19 NOTE — Patient Instructions (Signed)
Visit Information  Thank you for taking time to visit with me today. Please don't hesitate to contact me if I can be of assistance to you.   Please call the care guide team at [phone] if you need to cancel or reschedule your appointment.   If you are experiencing a Mental Health or [place] or need someone to talk to, please call the Suicide and Crisis Lifeline: 988 call the [place] National Suicide Prevention Lifeline: (228) 227-2921 or TTY: (510)589-1949 TTY 8258835709) to talk to a trained counselor call 1-800-273-TALK (toll free, 24 hour hotline) go to [facility] [address] [place] [phone]) call the [place]: [phone] call 911  Patient verbalizes understanding of instructions and care plan provided today and agrees to view in [place]. Active MyChart status and patient understanding of how to access instructions and care plan via MyChart confirmed with patient.     Follow up call scheduled with Christa See, CSW.  [name], BSW, MSW, LCSW  Licensed Education officer, environmental Health System  Mailing [place] [address], [place], [place] [address] Physical Address-[address], [place], [place] [address] Toll Free Main # [phone] Fax # [phone] Cell # [phone] [email or web address]

## 2021-08-19 NOTE — Patient Outreach (Signed)
  Care Coordination   Follow Up Visit Note   08/19/2021  Name: CHARNISE LOVAN MRN: [id number] DOB: Apr 29, 1955  [name] is a 66 y.o. year old female who sees Charlott Rakes, MD for primary care. I spoke with [name] by phone today.  What matters to the patients health and wellness today?  Patient would like to receive continued social work involvement, but aware that CSW is no longer assigned to her particular practice/provider.  Patient requested a referral to the social worker assigned to [place].  CSW collaboration with Christa See, CSW to place referral for continued social work services.   Goals Addressed   None     SDOH assessments and interventions completed:  Yes  Care Coordination Interventions Activated:  Yes   Care Coordination Interventions:  Yes, provided.   Follow up plan: Follow up call scheduled with Christa See, CSW.   Encounter Outcome:  Pt. Visit Completed.   [name], BSW, MSW, LCSW  Licensed Education officer, environmental Health System  Mailing [place] [address] [place], [place] [address] Physical Address-[address] [place], [place] [address] Toll Free Main # [phone] Fax # [phone] Cell # [phone] [email or web address]

## 2021-08-20 ENCOUNTER — Ambulatory Visit: Payer: Self-pay | Admitting: Licensed Clinical Social Worker

## 2021-08-20 DIAGNOSIS — E1110 Type 2 diabetes mellitus with ketoacidosis without coma: Secondary | ICD-10-CM

## 2021-08-20 DIAGNOSIS — H543 Unqualified visual loss, both eyes: Secondary | ICD-10-CM

## 2021-08-23 NOTE — Patient Instructions (Signed)
Visit Information  Thank you for taking time to visit with me today. Please don't hesitate to contact me if I can be of assistance to you.   Following are the goals we discussed today:   Goals Addressed             This Visit's Progress    Obtain Supportive Resources   On track    Care Coordination Interventions: Solution-Focused Strategies employed:  Active listening / Reflection utilized  Emotional Support Provided Reviewed mental health medications and discussed importance of compliance: Patient endorses compliance Verbalization of feelings encouraged   Patient participates in therapy weekly LCSW discussed Voc Rehab to assist with pt's goal to obtain employment. Care guide referral completed to assist with referral LCSW collaborated with Care Guide to schedule pt with [facility] RNCM to provide additional support with management of chronic health conditions           Our next appointment is by telephone on 8/28 at 1:00 PM  Please call the care guide team at [phone] if you need to cancel or reschedule your appointment.   If you are experiencing a Mental Health or [place] or need someone to talk to, please call the Suicide and Crisis Lifeline: 988 call 911   The patient verbalized understanding of instructions, educational materials, and care plan provided today and DECLINED offer to receive copy of patient instructions, educational materials, and care plan.   Christa See, MSW, [name] Phone [phone] 6:16 AM

## 2021-08-23 NOTE — Patient Outreach (Signed)
  Care Coordination   Initial Visit Note   08/23/2021 Name: Adrienne [name] MRN: [id number] DOB: 1955/09/27  Adrienne [name] is a 66 y.o. year old female who sees Charlott Rakes, MD for primary care. I spoke with  Adrienne [name] by phone today  What matters to the patients health and wellness today?  Supportive Resources to assist with obtaining employment    Goals Addressed             This Visit's Progress    Obtain Supportive Resources   On track    Care Coordination Interventions: Solution-Focused Strategies employed:  Active listening / Reflection utilized  Emotional Support Provided Reviewed mental health medications and discussed importance of compliance: Patient endorses compliance Verbalization of feelings encouraged   Patient participates in therapy weekly LCSW discussed Voc Rehab to assist with pt's goal to obtain employment. Care guide referral completed to assist with referral LCSW collaborated with Care Guide to schedule pt with [facility] RNCM to provide additional support with management of chronic health conditions           SDOH assessments and interventions completed:  Yes  SDOH Interventions Today    Flowsheet Row Most Recent Value  SDOH Interventions   Food Insecurity Interventions Intervention Not Indicated  Housing Interventions Intervention Not Indicated  Transportation Interventions Intervention Not Indicated        Care Coordination Interventions Activated:  Yes  Care Coordination Interventions:  Yes, provided   Follow up plan: Follow up call scheduled for 4-6 weeks    Encounter Outcome:  Pt. Visit Completed   Christa See, MSW, [email or web address] Phone [phone] 6:15 AM

## 2021-08-24 ENCOUNTER — Telehealth: Payer: Self-pay

## 2021-08-24 NOTE — Telephone Encounter (Signed)
   Telephone encounter was:  Successful.  08/24/2021 Name: Adrienne [name] MRN: [id number] DOB: January 05, 1956  [name] is a 66 y.o. year old female who is a primary care patient of Charlott Rakes, MD . The community resource team was consulted for assistance with  visually impaired resources, housing repairs.  Care guide performed the following interventions: Spoke with patient, she is interested in a referral to Vocational Rehab and [employer]. Patient gave consent to place [facility] referrals.  Follow Up Plan:  Care guide will follow up with patient by phone over the next 7 days.  Lynden Flemmer, AAS Paralegal, [place] [facility]  [address], [place] [address] ??[email or web address]  ?? [phone]   [email or web address]

## 2021-08-31 ENCOUNTER — Telehealth: Payer: Self-pay

## 2021-08-31 NOTE — Telephone Encounter (Signed)
   Telephone encounter was:  Successful.  08/31/2021 Name: SNOW PEOPLES MRN: [id number] DOB: 1955-02-08  [name] is a 66 y.o. year old female who is a primary care patient of Charlott Rakes, MD . The community resource team was consulted for assistance with Home Modifications and services for the blind.  Care guide performed the following interventions: Made follow-up call to patient regarding [facility] referrals to Vocational Rehab and [employer].  Vocational Rehab was unable to assist patient and referred to [facility] patient stated that they contacted her.  Sent message to [employer] to follow-up with referral placed 8/8/.   Follow Up Plan:  Care guide will follow up with patient by phone over the next 7 days and I will contact the patient once I receive a response from [employer].  Narelle Schoening, AAS Paralegal, [place] [facility]  [address], [place] [address] ??[email or web address]  ?? [phone]   [email or web address]

## 2021-09-03 ENCOUNTER — Telehealth: Payer: Self-pay

## 2021-09-03 DIAGNOSIS — E119 Type 2 diabetes mellitus without complications: Secondary | ICD-10-CM | POA: Diagnosis not present

## 2021-09-03 DIAGNOSIS — Z794 Long term (current) use of insulin: Secondary | ICD-10-CM | POA: Diagnosis not present

## 2021-09-03 NOTE — Telephone Encounter (Signed)
   Telephone encounter was:  Successful.  09/03/2021 Name: CASHAY MANGANELLI MRN: [id number] DOB: 02/04/1955  [name] is a 66 y.o. year old female who is a primary care patient of Charlott Rakes, MD . The community resource team was consulted for assistance with Home Modifications and services for the blind.  Care guide performed the following interventions: Received message from Sharol Given at [employer] referral was rejected due to patient's property value being over the $728A assistance policy.  Called patient to inform her the referral has been rejected.  Left message for Velveeta Joya San at [facility] to contact the patient.   Follow Up Plan:  No further follow up planned at this time. The patient has been provided with needed resources.  Delphia Kaylor, AAS Paralegal, [place] [facility]  [address], [place] [address] ??[email or web address]  ?? [phone]   [email or web address]

## 2021-09-10 ENCOUNTER — Ambulatory Visit: Payer: Medicare Other | Attending: Family Medicine

## 2021-09-10 DIAGNOSIS — E2839 Other primary ovarian failure: Secondary | ICD-10-CM | POA: Diagnosis not present

## 2021-09-10 DIAGNOSIS — Z1211 Encounter for screening for malignant neoplasm of colon: Secondary | ICD-10-CM | POA: Diagnosis not present

## 2021-09-10 DIAGNOSIS — Z0000 Encounter for general adult medical examination without abnormal findings: Secondary | ICD-10-CM

## 2021-09-10 NOTE — Progress Notes (Signed)
Subjective:   Adrienne [name] is a 66 y.o. female who presents for Medicare Annual (Subsequent) preventive examination.  I connected with Adrienne [name] on 09/10/21 at 1:53pm by telephone and verified that I am speaking with the correct person using two identifiers. I discussed the limitations, risks, security and privacy concerns of performing an evaluation and management service by telephone and the availability of in person appointments. I also discussed with the patient that there may be a patient responsible charge related to this service. The patient expressed understanding and agreed to proceed. Patient location:  home My Location:  office Persons on the telephone call:  patient and myself   Review of Systems          Objective:    There were no vitals filed for this visit. There is no height or weight on file to calculate BMI.     09/10/2021    1:54 PM 07/16/2020    2:37 PM 04/03/2019    1:07 PM 02/13/2019    7:59 PM 08/26/2018   12:17 AM 10/17/2016    3:39 PM 06/03/2016    3:57 PM  Advanced Directives  Does Patient Have a Medical Advance Directive? No No Yes No No No No  Type of Advance Directive   Out of facility DNR (pink MOST or yellow form)      Would patient like information on creating a medical advance directive? No - Patient declined No - Patient declined  No - Patient declined No - Guardian declined      Current Medications (verified) Outpatient Encounter Medications as of 09/10/2021  Medication Sig   acetaminophen (TYLENOL) 500 MG tablet Take 500 mg by mouth every 6 (six) hours as needed for moderate pain.   aspirin EC 81 MG tablet Take 81 mg by mouth daily.    atorvastatin (LIPITOR) 80 MG tablet TAKE 1 TABLET (80 MG TOTAL) BY MOUTH DAILY.   cetirizine (ZYRTEC) 10 MG tablet Take 1 tablet (10 mg total) by mouth daily.   cholecalciferol (VITAMIN D3) 25 MCG (1000 UNIT) tablet Take 1 tablet (1,000 Units total) by mouth daily.   clopidogrel (PLAVIX) 75 MG  tablet TAKE 1 TABLET (75 MG TOTAL) BY MOUTH DAILY.   Continuous Blood Gluc Receiver (FREESTYLE LIBRE 2 READER) DEVI Please use to check blood sugar three times daily. E11.69   Continuous Blood Gluc Sensor (FREESTYLE LIBRE 2 SENSOR) MISC Please use to check blood sugar three times daily. Change sensors once every 14 days. E11.69   diclofenac Sodium (VOLTAREN) 1 % GEL Apply 4 g topically 4 (four) times daily.   famotidine (PEPCID) 20 MG tablet Take 1 tablet (20 mg total) by mouth 2 (two) times daily.   fluticasone (FLONASE) 50 MCG/ACT nasal spray PLACE 2 SPRAYS INTO BOTH NOSTRILS DAILY.   guaiFENesin-dextromethorphan (ROBITUSSIN DM) 100-10 MG/5ML syrup Take 5 mLs by mouth every 6 (six) hours as needed for cough.   insulin glargine (LANTUS) 100 UNIT/ML Solostar Pen Inject 30 units in the am and 25 in the evening   Insulin Pen Needle 31G X 5 MM MISC use as directed 4 (four) times daily - after meals and at bedtime.   losartan-hydrochlorothiazide (HYZAAR) 100-25 MG tablet TAKE 1 TABLET BY MOUTH DAILY.   metFORMIN (GLUCOPHAGE) 500 MG tablet Take 1 tablet (500 mg total) by mouth 2 (two) times daily with a meal.   metoCLOPramide (REGLAN) 5 MG tablet Take 1 tablet (5 mg total) by mouth every 8 (eight) hours as needed for  nausea.   Misc. Devices MISC Blood pressure monitor. Dx: Hypertension   No facility-administered encounter medications on file as of 09/10/2021.    Allergies (verified) Codeine, Hydrocodone, Oxycodone, Penicillins, Clindamycin/lincomycin, Niacin and related, Penicillin g, and Niacin   History: Past Medical History:  Diagnosis Date   Blindness of both eyes    Diabetes ([place])    High cholesterol    Hypertension    Past Surgical History:  Procedure Laterality Date   CATARACT EXTRACTION, BILATERAL     Laser eye surgery, cataract surgery bi-lat   CESAREAN SECTION     Twins, Breech   EYE SURGERY Bilateral 2012   LASER, CATARACT   TEE WITHOUT CARDIOVERSION N/A 12/31/2013    Procedure: TRANSESOPHAGEAL ECHOCARDIOGRAM (TEE);  Surgeon: Laverda Page, MD;  Location: [place];  Service: Cardiovascular;  Laterality: N/A;  H&P in file   TUBAL LIGATION Bilateral    Family History  Problem Relation Age of Onset   Dementia Mother    Diabetes Father    Hypertension Father    Hypertension Sister    Diabetes Sister    Diabetes Sister    Heart disease Sister    Social History   Socioeconomic History   Marital status: Divorced    Spouse name: Not on file   Number of children: 3   Years of education: 12   Highest education level: 12th grade  Occupational History    Employer: [employer]  Tobacco Use   Smoking status: Former    Passive exposure: Past   Smokeless tobacco: Never   Tobacco comments:    QUIT IN 1987  Vaping Use   Vaping Use: Never used  Substance and Sexual Activity   Alcohol use: No    Alcohol/week: 0.0 standard drinks of alcohol    Comment: QUIT IN 1987   Drug use: No    Frequency: 2.0 times per week    Comment: QUIT 1980   Sexual activity: Not Currently  Other Topics Concern   Not on file  Social History Narrative   Patient is divorced with 3 children.   Patient is right handed.   Patient has 12 th grade education.   Patient has not been drinking caffeine.   Social Determinants of Health   Financial Resource Strain: Medium Risk (08/24/2021)   Overall Financial Resource Strain (CARDIA)    Difficulty of Paying Living Expenses: Somewhat hard  Food Insecurity: No Food Insecurity (08/23/2021)   Hunger Vital Sign    Worried About Running Out of Food in the Last Year: Never true    Ran Out of Food in the Last Year: Never true  Transportation Needs: No Transportation Needs (08/23/2021)   PRAPARE - Hydrologist (Medical): No    Lack of Transportation (Non-Medical): No  Physical Activity: Inactive (03/01/2021)   Exercise Vital Sign    Days of Exercise per Week: 0 days    Minutes of Exercise per  Session: 0 min  Stress: No Stress Concern Present (03/01/2021)   [place]    Feeling of Stress : Only a little  Social Connections: Moderately Integrated (03/01/2021)   Social Connection and Isolation Panel [NHANES]    Frequency of Communication with Friends and Family: More than three times a week    Frequency of Social Gatherings with Friends and Family: More than three times a week    Attends Religious Services: More than 4 times per year  Active Member of Clubs or Organizations: Yes    Attends Archivist Meetings: More than 4 times per year    Marital Status: Divorced    Tobacco Counseling Counseling given: Not Answered Tobacco comments: QUIT IN 1987   Clinical Intake:     Pain : No/denies pain     Diabetes: Yes CBG done?: No     Diabetic? Yes         Activities of Daily Living    09/10/2021    1:55 PM 03/01/2021   12:29 PM  In your present state of health, do you have any difficulty performing the following activities:  Hearing? 0 0  Vision? 1 1  Comment  Legally Blind  Difficulty concentrating or making decisions? 0 0  Walking or climbing stairs? 0 0  Dressing or bathing? 0 0  Doing errands, shopping? 0 0  Preparing Food and eating ? N N  Using the Toilet? N N  In the past six months, have you accidently leaked urine? N N  Do you have problems with loss of bowel control? N N  Managing your Medications? N N  Managing your Finances? N N  Housekeeping or managing your Housekeeping? N N    Patient Care Team: Charlott Rakes, MD as PCP - General (Family Medicine) Rex Kras, Claudette Stapler, RN as [place] any recent [place] you may have received from other than [facility] providers in the past year (date may be approximate).     Assessment:   This is a routine wellness examination for Adrienne [name].  Hearing/Vision screen No results  found.  Dietary issues and exercise activities discussed:     Goals Addressed   None   Depression Screen    09/10/2021    1:55 PM 04/20/2021    3:21 PM 03/01/2021   12:22 PM 11/18/2020    9:28 AM 11/09/2020    4:27 PM 07/16/2020    2:33 PM 05/11/2020   11:17 AM  PHQ 2/9 Scores  PHQ - 2 Score 0 0 0 '1 2 1 1  '$ PHQ- 9 Score  '6  8 3      '$ Fall Risk    09/10/2021    1:55 PM 08/02/2021    3:03 PM 07/22/2021    3:35 PM 05/07/2021    4:48 PM 04/20/2021    3:20 PM  Fall Risk   Falls in the past year? 1 1 0  0  Number falls in past yr: 0 0 0 0 0  Injury with Fall? 0 1 0 0 0  Risk for fall due to :   Impaired vision;Impaired balance/gait;Impaired mobility Impaired vision   Follow up   Falls evaluation completed      FALL RISK PREVENTION PERTAINING TO THE HOME:  Any stairs in or around the home? Yes  If so, are there any without handrails? No  Home free of loose throw rugs in walkways, pet beds, electrical cords, etc? Yes  Adequate lighting in your home to reduce risk of falls? Yes   ASSISTIVE DEVICES UTILIZED TO PREVENT FALLS:  Life alert? No  Use of a cane, walker or w/c? Yes  Grab bars in the bathroom? No  Shower chair or bench in shower? No  Elevated toilet seat or a handicapped toilet? No   TIMED UP AND GO:  Was the test performed? No .  Length of time to ambulate 10 feet: Pt states it would take her less than 20 secs  Gait steady and fast without use of assistive device  Cognitive Function:    09/10/2021    1:57 PM 07/16/2020    2:39 PM  MMSE - Mini Mental State Exam  Orientation to time 5 5  Orientation to Place 5 5  Registration 3 3  Attention/ Calculation 5 5  Recall 3 3  Language- name 2 objects 2 2  Language- repeat 1 1  Language- follow 3 step command 3 3  Language- read & follow direction 1 1  Write a sentence 1 0  Copy design 1 0  Total score 30 28        Immunizations Immunization History  Administered Date(s) Administered   Influenza,inj,Quad  PF,6+ Mos 10/05/2015, 10/18/2016, 10/04/2017, 10/21/2019, 10/19/2020   Influenza-Unspecified 11/02/2013, 11/14/2014   PFIZER(Purple Top)SARS-COV-2 Vaccination 05/20/2019, 06/10/2019, 01/04/2020   Pneumococcal Conjugate-13 10/21/2019   Pneumococcal Polysaccharide-23 11/04/2014   Pneumococcal-Unspecified 11/14/2014   Tdap 12/15/2015    TDAP status: Up to date  Flu Vaccine status: Due, Education has been provided regarding the importance of this vaccine. Advised may receive this vaccine at local pharmacy or Health Dept. Aware to provide a copy of the vaccination record if obtained from local pharmacy or Health Dept. Verbalized acceptance and understanding.  Pneumococcal vaccine status: Up to date  Covid-19 vaccine status: Completed vaccines  Qualifies for Shingles Vaccine? Yes   Zostavax completed No   Shingrix Completed?: No.    Education has been provided regarding the importance of this vaccine. Patient has been advised to call insurance company to determine out of pocket expense if they have not yet received this vaccine. Advised may also receive vaccine at local pharmacy or Health Dept. Verbalized acceptance and understanding.  Screening Tests Health Maintenance  Topic Date Due   COLONOSCOPY (Pts 45-34yr Insurance coverage will need to be confirmed)  01/17/2019   DEXA SCAN  Never done   COVID-19 Vaccine (4 - Pfizer series) 02/29/2020   FOOT EXAM  10/20/2020   INFLUENZA VACCINE  08/17/2021   Zoster Vaccines- Shingrix (1 of 2) 11/02/2021 (Originally 10/08/2004)   Pneumonia Vaccine 67 Years old (3 - PPSV23 or PCV20) 08/03/2022 (Originally 10/20/2020)   HEMOGLOBIN A1C  02/02/2022   MAMMOGRAM  04/09/2022   TETANUS/TDAP  12/14/2025   Hepatitis C Screening  Completed   HPV VACCINES  Aged Out   OPHTHALMOLOGY EXAM  Discontinued    Health Maintenance  Health Maintenance Due  Topic Date Due   COLONOSCOPY (Pts 45-434yrInsurance coverage will need to be confirmed)  01/17/2019   DEXA  SCAN  Never done   COVID-19 Vaccine (4 - Pfizer series) 02/29/2020   FOOT EXAM  10/20/2020   INFLUENZA VACCINE  08/17/2021    Colorectal cancer screening: Type of screening: Colonoscopy. Completed 01/16/09. Repeat every 10 years  Mammogram status: Completed 04/08/20. Repeat every year  Bone Density pt doesn't qualify   Lung Cancer Screening: (Low Dose CT Chest recommended if Age 67-80ears, 30 pack-year currently smoking OR have quit w/in 15years.) does not qualify.    Additional Screening:  Hepatitis C Screening: does not qualify; Completed 01/04/16  Vision Screening: Recommended annual ophthalmology exams for early detection of glaucoma and other disorders of the eye. Is the patient up to date with their annual eye exam?  Yes  Who is the provider or what is the name of the office in which the patient attends annual eye exams? [facility], Dr. CoMaudry DiegoIf pt is not established with a provider, would they like to  be referred to a provider to establish care? No .   Dental Screening: Recommended annual dental exams for proper oral hygiene  Community Resource Referral / Chronic Care Management: CRR required this visit?  No   CCM required this visit?  No      Plan:     I have personally reviewed and noted the following in the patient's chart:   Medical and social history Use of alcohol, tobacco or illicit drugs  Current medications and supplements including opioid prescriptions. Patient is not currently taking opioid prescriptions. Functional ability and status Nutritional status Physical activity Advanced directives List of other physicians Hospitalizations, surgeries, and ER visits in previous 12 months Vitals Screenings to include cognitive, depression, and falls Referrals and appointments  In addition, I have reviewed and discussed with patient certain preventive protocols, quality metrics, and best practice recommendations. A written personalized care plan for preventive  services as well as general preventive health recommendations were provided to patient.     Jackelyn Knife, RMA   09/10/2021   Nurse Notes: I  Spent 10 minutes on this telephone encounter

## 2021-09-13 ENCOUNTER — Other Ambulatory Visit (HOSPITAL_COMMUNITY): Payer: Self-pay

## 2021-09-13 ENCOUNTER — Encounter: Payer: Self-pay | Admitting: Licensed Clinical Social Worker

## 2021-09-13 ENCOUNTER — Other Ambulatory Visit: Payer: Self-pay | Admitting: Family Medicine

## 2021-09-13 ENCOUNTER — Ambulatory Visit: Payer: Self-pay | Admitting: Licensed Clinical Social Worker

## 2021-09-13 DIAGNOSIS — Z794 Long term (current) use of insulin: Secondary | ICD-10-CM

## 2021-09-13 NOTE — Telephone Encounter (Signed)
The patient has made an additional phone call to the practice returning what they presume to be a missed call   Please contact further when possible

## 2021-09-13 NOTE — Telephone Encounter (Signed)
Medication Refill - Medication: insulin glargine (LANTUS) 100 UNIT/ML Solostar Pen metFORMIN (GLUCOPHAGE) 500 MG tablet   Has the patient contacted their pharmacy? Yes.   (Agent: If no, request that the patient contact the pharmacy for the refill. If patient does not wish to contact the pharmacy document the reason why and proceed with request.) (Agent: If yes, when and what did the pharmacy advise?)  Preferred Pharmacy (with phone number or street name):  [facility] [place] [address]  Phone: [phone] Fax: [phone]   Has the patient been seen for an appointment in the last year OR does the patient have an upcoming appointment? Yes.    Agent: Please be advised that RX refills may take up to 3 business days. We ask that you follow-up with your pharmacy.

## 2021-09-14 ENCOUNTER — Other Ambulatory Visit: Payer: Self-pay

## 2021-09-14 ENCOUNTER — Other Ambulatory Visit (HOSPITAL_COMMUNITY): Payer: Self-pay

## 2021-09-14 NOTE — Telephone Encounter (Signed)
Rx- 2/70/35 [id number]- duplicate request Requested Prescriptions  Pending Prescriptions Disp Refills  . insulin glargine (LANTUS SOLOSTAR) 100 UNIT/ML Solostar Pen 30 mL 6    Sig: Inject 30 Units into the skin 2 (two) times daily.     Endocrinology:  Diabetes - Insulins Failed - 09/13/2021  3:54 PM      Failed - HBA1C is between 0 and 7.9 and within 180 days    HbA1c, POC (controlled diabetic range)  Date Value Ref Range Status  08/02/2021 10.6 (A) 0.0 - 7.0 % Final         Passed - Valid encounter within last 6 months    Recent Outpatient Visits          1 month ago Type 2 diabetes mellitus with other specified complication, with long-term current use of insulin ([place])   [place], [place], MD   4 months ago Type 2 diabetes mellitus with other specified complication, with long-term current use of insulin ([place])   [place], [place], MD   7 months ago Chronic left shoulder pain   [place], [place], MD   11 months ago Type 2 diabetes mellitus with other specified complication, with long-term current use of insulin ([place])   [place], [place], MD   1 year ago Type 2 diabetes mellitus with other specified complication, with long-term current use of insulin ([place])   [place], [place], MD      Future Appointments            In 2 months Charlott Rakes, MD [place]

## 2021-09-14 NOTE — Patient Outreach (Signed)
  Care Coordination   09/14/2021 Name: Adrienne [name] MRN: [id number] DOB: Oct 14, 1955   Care Coordination Outreach Attempts:  An unsuccessful telephone outreach was attempted for a scheduled appointment today.  Follow Up Plan:  Additional outreach attempts will be made to offer the patient care coordination information and services.   Encounter Outcome:  No Answer  Care Coordination Interventions Activated:  No   Care Coordination Interventions:  No, not indicated    Christa See, [email or web address], [email or web address] Phone [phone] 8:24 AM

## 2021-09-17 ENCOUNTER — Other Ambulatory Visit: Payer: Self-pay

## 2021-09-17 ENCOUNTER — Other Ambulatory Visit (HOSPITAL_COMMUNITY): Payer: Self-pay

## 2021-09-17 ENCOUNTER — Other Ambulatory Visit: Payer: Self-pay | Admitting: Pharmacist

## 2021-09-17 ENCOUNTER — Other Ambulatory Visit: Payer: Self-pay | Admitting: Family Medicine

## 2021-09-17 DIAGNOSIS — E1169 Type 2 diabetes mellitus with other specified complication: Secondary | ICD-10-CM

## 2021-09-17 DIAGNOSIS — Z794 Long term (current) use of insulin: Secondary | ICD-10-CM

## 2021-09-17 MED ORDER — INSULIN GLARGINE 100 UNIT/ML SOLOSTAR PEN
PEN_INJECTOR | SUBCUTANEOUS | 6 refills | Status: DC
  Filled 2021-09-17: qty 30, 54d supply, fill #0
  Filled 2021-11-08: qty 30, 54d supply, fill #1

## 2021-09-17 NOTE — Telephone Encounter (Signed)
Pt states she is going out of town and need the medication asap

## 2021-09-21 ENCOUNTER — Telehealth: Payer: Medicare Other | Admitting: *Deleted

## 2021-09-21 ENCOUNTER — Ambulatory Visit: Payer: Self-pay

## 2021-09-21 NOTE — Patient Outreach (Signed)
  Care Coordination   09/21/2021 Name: Adrienne [name] MRN: [id number] DOB: 06/02/1955   Care Coordination Outreach Attempts:  An unsuccessful telephone outreach was attempted for a scheduled appointment today.  Follow Up Plan:  Additional outreach attempts will be made to offer the patient care coordination information and services.   Encounter Outcome:  Pt. Request to Call Back  Care Coordination Interventions Activated:  Yes   Care Coordination Interventions:  Yes, provided    Barb Merino, RN, BSN, CCM Care Management Coordinator [place] Management Direct Phone: [phone]

## 2021-09-22 ENCOUNTER — Telehealth: Payer: Self-pay | Admitting: Emergency Medicine

## 2021-09-22 NOTE — Telephone Encounter (Signed)
Patient is requesting a call to discuss why her Insulin was cancelled, patient states [place] told her that her provider had cancelled her insulin.

## 2021-09-22 NOTE — Telephone Encounter (Signed)
Can we clarify what it is specifically that she has a question about, friend?

## 2021-09-22 NOTE — Telephone Encounter (Signed)
Copied from [place] [id number]. Topic: General - Other >> Sep 17, 2021 10:43 AM Leitha Schuller wrote: Pt requesting a cb form [place] regarding insulin glargine (LANTUS) 100 UNIT/ML Solostar Pen  Please assist pt further

## 2021-09-23 ENCOUNTER — Other Ambulatory Visit: Payer: Self-pay

## 2021-09-23 ENCOUNTER — Other Ambulatory Visit (HOSPITAL_COMMUNITY): Payer: Self-pay

## 2021-09-24 NOTE — Telephone Encounter (Signed)
Pt was called and she states that she has picked up her insulin.

## 2021-09-30 ENCOUNTER — Ambulatory Visit: Payer: Self-pay

## 2021-09-30 NOTE — Progress Notes (Signed)
This encounter was created in error - please disregard.

## 2021-09-30 NOTE — Patient Outreach (Signed)
  Care Coordination   Initial Visit Note   09/30/2021 Name: PATRICK SOHM MRN: [id number] DOB: 1955-12-23  Adrienne [name] is a 66 y.o. year old female who sees Charlott Rakes, MD for primary care. I spoke with  Adrienne [name] by phone today.  What matters to the patients health and wellness today?  Patient would like to work on lowering her A1c <7%.     Goals Addressed               This Visit's Progress     Patient Stated     I want to improve my diabetes (pt-stated)        Care Coordination Interventions: Provided education to patient about basic DM disease process Reviewed medications with patient and discussed importance of medication adherence Advised patient, providing education and rationale, to check cbg daily before meals and record, calling PCP for findings outside established parameters Review of patient status, including review of consultants reports, relevant laboratory and other test results, and medications completed Educated patient on how to use Meal planning using the plate method and portion control  Educated patient about exercise recommendations !50 minutes per week as tolerated and suggested adding some light weight lifting to her current regimen Educated patient on the importance to drink 48-64 oz of water daily unless otherwise directed Discussed upcoming PCP appointment scheduled around 11/17/21          SDOH assessments and interventions completed:  No     Care Coordination Interventions Activated:  Yes  Care Coordination Interventions:  Yes, provided   Follow up plan: Follow up call scheduled for 12/01/21 '@09'$ :00 AM    Encounter Outcome:  Pt. Visit Completed

## 2021-09-30 NOTE — Patient Instructions (Signed)
Visit Information  Thank you for taking time to visit with me today. Please don't hesitate to contact me if I can be of assistance to you.   Following are the goals we discussed today:   Goals Addressed               This Visit's Progress     Patient Stated     I want to improve my diabetes (pt-stated)        Care Coordination Interventions: Provided education to patient about basic DM disease process Reviewed medications with patient and discussed importance of medication adherence Advised patient, providing education and rationale, to check cbg daily before meals and record, calling PCP for findings outside established parameters Review of patient status, including review of consultants reports, relevant laboratory and other test results, and medications completed Educated patient on how to use Meal planning using the plate method and portion control  Educated patient about exercise recommendations !50 minutes per week as tolerated and suggested adding some light weight lifting to her current regimen Educated patient on the importance to drink 48-64 oz of water daily unless otherwise directed Discussed upcoming PCP appointment scheduled around 11/17/21          Our next appointment is by telephone on 12/01/21 at 09:00 AM   Please call the care guide team at [phone] if you need to cancel or reschedule your appointment.   If you are experiencing a Mental Health or [place] or need someone to talk to, please call 1-800-273-TALK (toll free, 24 hour hotline)  The patient verbalized understanding of instructions, educational materials, and care plan provided today and agreed to receive a mailed copy of patient instructions, educational materials, and care plan.   Barb Merino, RN, BSN, CCM Care Management Coordinator [facility] Care Management  Direct Phone: [phone]

## 2021-10-04 ENCOUNTER — Other Ambulatory Visit (HOSPITAL_COMMUNITY): Payer: Self-pay

## 2021-10-08 ENCOUNTER — Other Ambulatory Visit (HOSPITAL_COMMUNITY): Payer: Self-pay

## 2021-10-11 ENCOUNTER — Other Ambulatory Visit (HOSPITAL_COMMUNITY): Payer: Self-pay

## 2021-11-08 ENCOUNTER — Other Ambulatory Visit (HOSPITAL_COMMUNITY): Payer: Self-pay

## 2021-11-17 ENCOUNTER — Other Ambulatory Visit: Payer: Self-pay

## 2021-11-22 ENCOUNTER — Other Ambulatory Visit (HOSPITAL_COMMUNITY): Payer: Self-pay

## 2021-11-22 ENCOUNTER — Encounter: Payer: Self-pay | Admitting: Family Medicine

## 2021-11-22 ENCOUNTER — Other Ambulatory Visit: Payer: Self-pay

## 2021-11-22 ENCOUNTER — Ambulatory Visit: Payer: Medicare Other | Attending: Family Medicine | Admitting: Family Medicine

## 2021-11-22 VITALS — BP 167/90 | HR 75 | Wt 200.6 lb

## 2021-11-22 DIAGNOSIS — E1122 Type 2 diabetes mellitus with diabetic chronic kidney disease: Secondary | ICD-10-CM

## 2021-11-22 DIAGNOSIS — E785 Hyperlipidemia, unspecified: Secondary | ICD-10-CM | POA: Diagnosis not present

## 2021-11-22 DIAGNOSIS — Z794 Long term (current) use of insulin: Secondary | ICD-10-CM

## 2021-11-22 DIAGNOSIS — Z23 Encounter for immunization: Secondary | ICD-10-CM

## 2021-11-22 DIAGNOSIS — B029 Zoster without complications: Secondary | ICD-10-CM | POA: Diagnosis not present

## 2021-11-22 DIAGNOSIS — E1169 Type 2 diabetes mellitus with other specified complication: Secondary | ICD-10-CM

## 2021-11-22 DIAGNOSIS — I129 Hypertensive chronic kidney disease with stage 1 through stage 4 chronic kidney disease, or unspecified chronic kidney disease: Secondary | ICD-10-CM | POA: Diagnosis not present

## 2021-11-22 LAB — GLUCOSE, POCT (MANUAL RESULT ENTRY): POC Glucose: 175 mg/dl — AB (ref 70–99)

## 2021-11-22 LAB — POCT GLYCOSYLATED HEMOGLOBIN (HGB A1C): HbA1c, POC (controlled diabetic range): 9.5 % — AB (ref 0.0–7.0)

## 2021-11-22 MED ORDER — ZOSTER VAC RECOMB ADJUVANTED 50 MCG/0.5ML IM SUSR: 0.5000 mL | Freq: Once | INTRAMUSCULAR | 0 refills | Status: AC

## 2021-11-22 MED ORDER — PREDNISONE 20 MG PO TABS
20.0000 mg | ORAL_TABLET | Freq: Every day | ORAL | 0 refills | Status: DC
  Filled 2021-11-22 (×2): qty 5, 5d supply, fill #0

## 2021-11-22 MED ORDER — INSULIN GLARGINE 100 UNIT/ML SOLOSTAR PEN
PEN_INJECTOR | SUBCUTANEOUS | 6 refills | Status: DC
  Filled 2021-11-22: qty 30, fill #0
  Filled 2021-12-21: qty 30, 49d supply, fill #0

## 2021-11-22 NOTE — Progress Notes (Unsigned)
Subjective:  Patient ID: Adrienne [name], female    DOB: 07-11-1955  Age: 66 y.o. MRN: [id number]  CC: Diabetes and Facial Pain   HPI Adrienne [name] is a 66 y.o. year old female with a history of  uncontrolled type 2 diabetes mellitus (A1c 9.5), anemia, central retinal artery occlusion (previously followed by 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   Interval History: She noticed a bump above her left eyelid and also noticed itching in her left eyelid and this was associated with some burning but no nasal congestion, post nasal drip or myalgias, She said the left side of her face also hurt. Burning has resolved and it is not bothering her.  She had also noticed some left ear symptoms and fullness which has resolved but no hearing loss.  Denies presence of upper respiratory symptoms.  Regards to her diabetes mellitus she endorses adherence with her medications and acknowledges that her blood sugars have been elevated.  She currently does not have the reader for her CGM. Her blood pressure is also elevated and she endorses adherence with her antihypertensive  She would like PCS services Past Medical History:  Diagnosis Date   Blindness of both eyes    Diabetes ([place])    High cholesterol    Hypertension     Past Surgical History:  Procedure Laterality Date   CATARACT EXTRACTION, BILATERAL     Laser eye surgery, cataract surgery bi-lat   CESAREAN SECTION     Twins, [place]   EYE SURGERY Bilateral 2012   LASER, CATARACT   TEE WITHOUT CARDIOVERSION N/A 12/31/2013   Procedure: TRANSESOPHAGEAL ECHOCARDIOGRAM (TEE);  Surgeon: Laverda Page, MD;  Location: [place];  Service: Cardiovascular;  Laterality: N/A;  H&P in file   TUBAL LIGATION Bilateral     Family History  Problem Relation Age of  Onset   Dementia Mother    Diabetes Father    Hypertension Father    Hypertension Sister    Diabetes Sister    Diabetes Sister    Heart disease Sister     Social History   Socioeconomic History   Marital status: Divorced    Spouse name: Not on file   Number of children: 3   Years of education: 12   Highest education level: 12th grade  Occupational History    Employer: [employer]  Tobacco Use   Smoking status: Former    Passive exposure: Past   Smokeless tobacco: Never   Tobacco comments:    QUIT IN 1987  Vaping Use   Vaping Use: Never used  Substance and Sexual Activity   Alcohol use: No    Alcohol/week: 0.0 standard drinks of alcohol    Comment: QUIT IN 1987   Drug use: No    Frequency: 2.0 times per week    Comment: QUIT 1980   Sexual activity: Not Currently  Other Topics Concern   Not on file  Social History Narrative   Patient is divorced with 3 children.   Patient is right handed.   Patient has 12 th grade education.   Patient has not been drinking caffeine.   Social Determinants of Health   Financial Resource Strain: Medium Risk (08/24/2021)   Overall Financial Resource Strain (CARDIA)    Difficulty of Paying Living Expenses: Somewhat hard  Food Insecurity: No  Food Insecurity (08/23/2021)   Hunger Vital Sign    Worried About Running Out of Food in the Last Year: Never true    Ran Out of Food in the Last Year: Never true  Transportation Needs: No Transportation Needs (08/23/2021)   PRAPARE - Hydrologist (Medical): No    Lack of Transportation (Non-Medical): No  Physical Activity: Inactive (03/01/2021)   Exercise Vital Sign    Days of Exercise per Week: 0 days    Minutes of Exercise per Session: 0 min  Stress: No Stress Concern Present (03/01/2021)   [place]    Feeling of Stress : Only a little  Social Connections: Moderately Integrated (03/01/2021)    Social Connection and Isolation Panel [NHANES]    Frequency of Communication with Friends and Family: More than three times a week    Frequency of Social Gatherings with Friends and Family: More than three times a week    Attends Religious Services: More than 4 times per year    Active Member of [employer] or Organizations: Yes    Attends Music therapist: More than 4 times per year    Marital Status: Divorced    Allergies  Allergen Reactions   Codeine Itching   Hydrocodone Itching   Oxycodone Itching   Penicillins Itching   Clindamycin/Lincomycin Itching   Niacin And Related Itching   Penicillin G Itching   Niacin Itching and Anxiety    Facial flushing    Outpatient Medications Prior to Visit  Medication Sig Dispense Refill   acetaminophen (TYLENOL) 500 MG tablet Take 500 mg by mouth every 6 (six) hours as needed for moderate pain.     aspirin EC 81 MG tablet Take 81 mg by mouth daily.      atorvastatin (LIPITOR) 80 MG tablet Take 1 tablet (80 mg total) by mouth daily. 90 tablet 1   cetirizine (ZYRTEC) 10 MG tablet Take 1 tablet (10 mg total) by mouth daily. 30 tablet 1   cholecalciferol (VITAMIN D3) 25 MCG (1000 UNIT) tablet Take 1 tablet (1,000 Units total) by mouth daily. 30 tablet 3   clopidogrel (PLAVIX) 75 MG tablet TAKE 1 TABLET (75 MG TOTAL) BY MOUTH DAILY. 90 tablet 1   Continuous Blood Gluc Receiver (FREESTYLE LIBRE 2 READER) DEVI Please use to check blood sugar three times daily. E11.69 1 each 0   Continuous Blood Gluc Sensor (FREESTYLE LIBRE 2 SENSOR) MISC Please use to check blood sugar three times daily. Change sensors once every 14 days. E11.69 2 each 3   diclofenac Sodium (VOLTAREN) 1 % GEL Apply 4 g topically 4 (four) times daily. 100 g 1   famotidine (PEPCID) 20 MG tablet Take 1 tablet (20 mg total) by mouth 2 (two) times daily. 180 tablet 1   fluticasone (FLONASE) 50 MCG/ACT nasal spray PLACE 2 SPRAYS INTO BOTH NOSTRILS DAILY. 16 g 2    guaiFENesin-dextromethorphan (ROBITUSSIN DM) 100-10 MG/5ML syrup Take 5 mLs by mouth every 6 (six) hours as needed for cough. 118 mL 0   Insulin Pen Needle 31G X 5 MM MISC use as directed 4 (four) times daily - after meals and at bedtime. 100 each 3   losartan-hydrochlorothiazide (HYZAAR) 100-25 MG tablet TAKE 1 TABLET BY MOUTH DAILY. 90 tablet 1   metFORMIN (GLUCOPHAGE) 500 MG tablet Take 1 tablet (500 mg total) by mouth 2 (two) times daily with a meal. 180 tablet 1   metoCLOPramide (  REGLAN) 5 MG tablet Take 1 tablet (5 mg total) by mouth every 8 (eight) hours as needed for nausea. 90 tablet 1   Misc. Devices MISC Blood pressure monitor. Dx: Hypertension 1 each 0   insulin glargine (LANTUS) 100 UNIT/ML Solostar Pen Inject 30 units in the am and 25 in the evening 30 mL 6   No facility-administered medications prior to visit.     ROS Review of Systems  Constitutional:  Negative for activity change and appetite change.  HENT:  Negative for sinus pressure and sore throat.   Eyes:  Positive for visual disturbance.  Respiratory:  Negative for chest tightness, shortness of breath and wheezing.   Cardiovascular:  Negative for chest pain and palpitations.  Gastrointestinal:  Negative for abdominal distention, abdominal pain and constipation.  Genitourinary: Negative.   Musculoskeletal: Negative.   Skin:  Positive for rash.  Psychiatric/Behavioral:  Negative for behavioral problems and dysphoric mood.     Objective:  BP (!) 167/90   Pulse 75   Wt 200 lb 9.6 oz (91 kg)   SpO2 99%   BMI 32.38 kg/m      11/22/2021    4:49 PM 11/22/2021    4:03 PM 08/02/2021    2:59 PM  BP/Weight  Systolic BP 009 381 829  Diastolic BP 90 82 81  Wt. (Lbs)  200.6 203.6  BMI  32.38 kg/m2 32.86 kg/m2      Physical Exam Constitutional:      Appearance: She is well-developed.  Eyes:     Comments: Left lower eyelid with tiny nodule  Cardiovascular:     Rate and Rhythm: Normal rate.     Heart sounds:  Normal heart sounds. No murmur heard. Pulmonary:     Effort: Pulmonary effort is normal.     Breath sounds: Normal breath sounds. No wheezing or rales.  Chest:     Chest wall: No tenderness.  Abdominal:     General: Bowel sounds are normal. There is no distension.     Palpations: Abdomen is soft. There is no mass.     Tenderness: There is no abdominal tenderness.  Musculoskeletal:        General: Normal range of motion.     Right lower leg: No edema.     Left lower leg: No edema.  Skin:    Comments: Left upper forehead towards hairline with 3-minute erythematous patches. Slightly superior to left eyebrow is an erythematous Scab Lesions are not tender. Slight edema of left side of face  Neurological:     Mental Status: She is alert and oriented to person, place, and time.  Psychiatric:        Mood and Affect: Mood normal.        Latest Ref Rng & Units 08/02/2021    4:00 PM 04/20/2021    4:07 PM 01/19/2021    4:01 PM  CMP  Glucose 70 - 99 mg/dL 295  225  383   BUN 8 - 27 mg/dL 25  35  19   Creatinine 0.57 - 1.00 mg/dL 1.75  1.79  1.66   Sodium 134 - 144 mmol/L 138  139  137   Potassium 3.5 - 5.2 mmol/L 4.6  4.2  4.5   Chloride 96 - 106 mmol/L 100  100  99   CO2 20 - 29 mmol/L '22  22  21   '$ Calcium 8.7 - 10.3 mg/dL 9.4  10.0  9.8   Total Protein 6.0 - 8.5 g/dL 6.9  6.8  7.2   Total Bilirubin 0.0 - 1.2 mg/dL <0.2  0.3  0.3   Alkaline Phos 44 - 121 IU/L 105  101  143   AST 0 - 40 IU/L '15  16  15   '$ ALT 0 - 32 IU/L '19  17  14     '$ Lipid Panel     Component Value Date/Time   CHOL 202 (H) 05/07/2019 1049   TRIG 122 05/07/2019 1049   HDL 46 05/07/2019 1049   CHOLHDL 4.4 05/07/2019 1049   CHOLHDL 2.7 01/04/2016 1138   VLDL 12 01/04/2016 1138   LDLCALC 134 (H) 05/07/2019 1049    CBC    Component Value Date/Time   WBC 9.7 10/19/2020 1649   WBC 9.6 02/13/2019 2030   RBC 3.71 (L) 10/19/2020 1649   RBC 3.14 (L) 02/13/2019 2030   HGB 11.5 10/19/2020 1649   HCT 33.4 (L)  10/19/2020 1649   PLT 508 (H) 10/19/2020 1649   MCV 90 10/19/2020 1649   MCH 31.0 10/19/2020 1649   MCH 30.6 02/13/2019 2030   MCHC 34.4 10/19/2020 1649   MCHC 32.9 02/13/2019 2030   RDW 11.8 10/19/2020 1649   LYMPHSABS 2.7 10/19/2020 1649   MONOABS 0.4 02/13/2019 2030   EOSABS 0.3 10/19/2020 1649   BASOSABS 0.1 10/19/2020 1649    Lab Results  Component Value Date   HGBA1C 9.5 (A) 11/22/2021    Assessment & Plan:  1. Type 2 diabetes mellitus with other specified complication, with long-term current use of insulin (HCC) Uncontrolled with A1c of 9.5, goal is less than 7.0 Increased her Lantus dose by a total daily dose of 6 units to prevent her from hypoglycemia given her visual impairment Counseled on Diabetic diet, my plate method, 626 minutes of moderate intensity exercise/week Blood sugar logs with fasting goals of 80-120 mg/dl, random of less than 180 and in the event of sugars less than 60 mg/dl or greater than 400 mg/dl encouraged to notify the clinic. Advised on the need for annual eye exams, annual foot exams, Pneumonia vaccine. - POCT glycosylated hemoglobin (Hb A1C) - POCT glucose (manual entry) - insulin glargine (LANTUS) 100 UNIT/ML Solostar Pen; Inject 33 units in the am and 28 in the evening  Dispense: 30 mL; Refill: 6 - Microalbumin/Creatinine Ratio, Urine - Basic Metabolic Panel  2. Herpes zoster without complication She is outside of the window for treatment with antiviral She has no auricular symptoms at the moment We will place on prednisone - predniSONE (DELTASONE) 20 MG tablet; Take 1 tablet (20 mg total) by mouth daily with breakfast.  Dispense: 5 tablet; Refill: 0 - Zoster Vaccine Adjuvanted [facility]) injection; Inject 0.5 mLs into the muscle once for 1 dose.  Dispense: 0.5 mL; Refill: 0  3. 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 Discussed medication compliance, adverse effects.   4. Hyperlipidemia associated with type 2 diabetes mellitus ([place]) Uncontrolled She is due for lipid panel We will work towards obtaining this at her next visit Continue statin Low-cholesterol diet  5. Need for immunization against influenza - Flu Vaccine QUAD High Dose(Fluad)    Meds  ordered this encounter  Medications   insulin glargine (LANTUS) 100 UNIT/ML Solostar Pen    Sig: Inject 33 units in the am and 28 in the evening    Dispense:  30 mL    Refill:  6    Dose increase   predniSONE (DELTASONE) 20 MG tablet    Sig: Take 1 tablet (20 mg total) by mouth daily with breakfast.    Dispense:  5 tablet    Refill:  0   Zoster Vaccine Adjuvanted [facility]) injection    Sig: Inject 0.5 mLs into the muscle once for 1 dose.    Dispense:  0.5 mL    Refill:  0    Follow-up: Return in about 2 months (around 01/22/2022) for Chronic medical conditions, 2nd dose shingrix, Blood pressure f/u with [name] in 2 weeks.       Charlott Rakes, MD, FAAFP. [facility] and [place], [place]   11/23/2021, 5:36 PM

## 2021-11-22 NOTE — Patient Instructions (Signed)

## 2021-11-23 ENCOUNTER — Other Ambulatory Visit: Payer: Self-pay

## 2021-11-24 ENCOUNTER — Other Ambulatory Visit (HOSPITAL_COMMUNITY): Payer: Self-pay

## 2021-11-30 DIAGNOSIS — E119 Type 2 diabetes mellitus without complications: Secondary | ICD-10-CM | POA: Diagnosis not present

## 2021-11-30 DIAGNOSIS — Z794 Long term (current) use of insulin: Secondary | ICD-10-CM | POA: Diagnosis not present

## 2021-12-01 ENCOUNTER — Ambulatory Visit: Payer: Self-pay

## 2021-12-01 NOTE — Patient Instructions (Signed)
Visit Information  Thank you for taking time to visit with me today. Please don't hesitate to contact me if I can be of assistance to you.   Following are the goals we discussed today:   Goals Addressed               This Visit's Progress     Patient Stated     I want to improve my diabetes (pt-stated)        Care Coordination Interventions: Evaluation of current treatment plan related to Diabetes Mellitus and patient's adherence to plan as established by provider Determined patient completed her diabetes follow up with PCP Review of patient status, including review of consultant's reports, relevant laboratory and other test results, and medications completed Determined patient uses her air fryer and microwave to self prepare meals, her sister prepares her a home cooked meal on Wednesday when she arrives for a Bible study Determined patient's sister and brother in law make routine visits to her home to [place] her groceries, her daughter is also available to help and stops by routinely Determined patient could use help with ordering her UHC OTC items, sent SW referral to assist         Our next appointment is by telephone on 01/28/21 at 1030 AM  Please call the care guide team at [phone] if you need to cancel or reschedule your appointment.   If you are experiencing a Mental Health or [place] or need someone to talk to, please call 1-800-273-TALK (toll free, 24 hour hotline)  The patient verbalized understanding of instructions, educational materials, and care plan provided today and agreed to receive a mailed copy of patient instructions, educational materials, and care plan.   Barb Merino, RN, BSN, CCM Care Management Coordinator [facility] Care Management Direct Phone: [phone]

## 2021-12-01 NOTE — Patient Outreach (Signed)
  Care Coordination   Follow Up Visit Note   12/01/2021 Name: Adrienne [name] MRN: [id number] DOB: 1955-12-20  Adrienne [name] is a 66 y.o. year old female who sees Charlott Rakes, MD for primary care. I spoke with  Adrienne [name] by phone today.  What matters to the patients health and wellness today?  Patient will continue to adhere to her prescribed diabetes treatment plan in attempts to lower her A1c.    Goals Addressed               This Visit's Progress     Patient Stated     I want to improve my diabetes (pt-stated)        Care Coordination Interventions: Evaluation of current treatment plan related to Diabetes Mellitus and patient's adherence to plan as established by provider Determined patient completed her diabetes follow up with PCP Review of patient status, including review of consultant's reports, relevant laboratory and other test results, and medications completed Determined patient uses her air fryer and microwave to self prepare meals, her sister prepares her a home cooked meal on Wednesday when she arrives for a Bible study Determined patient's sister and brother in law make routine visits to her home to [place] her groceries, her daughter is also available to help and stops by routinely Determined patient could use help with ordering her UHC OTC items, sent SW referral to assist         SDOH assessments and interventions completed:  No     Care Coordination Interventions Activated:  Yes  Care Coordination Interventions:  Yes, provided   Follow up plan: Referral made to [place] to assist with [facility] OTC ordering  Follow up call scheduled for 01/28/21 '@1030'$  AM    Encounter Outcome:  Pt. Visit Completed

## 2021-12-06 ENCOUNTER — Ambulatory Visit: Payer: Self-pay

## 2021-12-06 NOTE — Patient Outreach (Signed)
  Care Coordination   Follow Up Visit Note   12/06/2021 Name: Adrienne [name] MRN: [id number] DOB: [birth year]  Adrienne [name] is a 66 y.o. year old female who sees Charlott Rakes, MD for primary care. I spoke with  Adrienne [name] by phone today.  What matters to the patients health and wellness today?  I need help ordering my over the counter items    Goals Addressed             This Visit's Progress    Care Coordination Activities       Care Coordination Interventions: Discussed patient needs assistance ordering items from her over the counter catalog  Scheduled a call with the patient at 10 am on 11/21         SDOH assessments and interventions completed:  No     Care Coordination Interventions Activated:  Yes  Care Coordination Interventions:  Yes, provided   Follow up plan: Follow up call scheduled for 11/21    Encounter Outcome:  Pt. Visit Completed   Daneen Schick, Arita Miss, CDP Social Worker, Certified Dementia Practitioner [place] Management  Care Coordination [phone]

## 2021-12-07 ENCOUNTER — Ambulatory Visit: Payer: Self-pay

## 2021-12-07 NOTE — Patient Outreach (Signed)
  Care Coordination   Follow Up Visit Note   12/07/2021 Name: Adrienne [name] MRN: [id number] DOB: 11/24/1955  Adrienne [name] is a 66 y.o. year old female who sees Charlott Rakes, MD for primary care. I spoke with  Adrienne [name] by phone today.  What matters to the patients health and wellness today?  I need assistance ordering supplies    Goals Addressed             This Visit's Progress    Care Coordination Activities       Care Coordination Interventions: Assisted the patient with contacting her health plan to order a new over the counter catalog Determined the patients has a balance of $100  Requested an electronic copy be sent to BSW to review with the patient as she is visually impaired and unable to order items on her own Scheduled a call with the patient on 11/28 to review catalog and assist with ordering         SDOH assessments and interventions completed:  No     Care Coordination Interventions Activated:  Yes  Care Coordination Interventions:  Yes, provided   Follow up plan: Follow up call scheduled for 11/28    Encounter Outcome:  Pt. Visit Completed   Daneen Schick, Arita Miss, CDP Social Worker, Certified Dementia Practitioner [place] [facility]  Care Coordination [phone]

## 2021-12-07 NOTE — Patient Instructions (Signed)
Visit Information  Thank you for taking time to visit with me today. Please don't hesitate to contact me if I can be of assistance to you.   Following are the goals we discussed today:   Goals Addressed             This Visit's Progress    Care Coordination Activities       Care Coordination Interventions: Assisted the patient with contacting her health plan to order a new over the counter catalog Determined the patients has a balance of $100  Requested an electronic copy be sent to BSW to review with the patient as she is visually impaired and unable to order items on her own Scheduled a call with the patient on 11/28 to review catalog and assist with ordering         Our next appointment is by telephone on 11/28 at 2:00 pm  Please call the care guide team at [phone] if you need to cancel or reschedule your appointment.   If you are experiencing a Mental Health or [place] or need someone to talk to, please go to [facility] [place] [phone])  The patient verbalized understanding of instructions, educational materials, and care plan provided today and DECLINED offer to receive copy of patient instructions, educational materials, and care plan.   Telephone follow up appointment with care management team member scheduled for:11/28  Daneen Schick, [place], CDP Social Worker, Certified Dementia Practitioner [place] Management  Care Coordination [phone]

## 2021-12-13 ENCOUNTER — Ambulatory Visit: Payer: Self-pay | Admitting: *Deleted

## 2021-12-13 NOTE — Telephone Encounter (Signed)
Summary: feels like yeast infection is happening   Patient called in called in says feels like a yeast infection is coming on, she didnt mention symptoms and I sblind. She is asking for diflucan to be sent.           Chief Complaint: requesting medication sent to [facility] so they will deliver  Symptoms: denies sx. Only reports "feels like a yeast infection is coming on". Reports she feels like since taking medication for shingles she has noticed she may need diflucan  Frequency: since having shingles  Pertinent Negatives: Patient denies fever, no itching no vaginal discharge, no other sx reported Disposition: '[]'$ ED /'[]'$ Urgent Care (no appt availability in office) / '[]'$ Appointment(In office/virtual)/ '[]'$  [facility]/ '[x]'$ Home Care/ '[]'$ Refused Recommended Disposition /'[]'$ [place] [facility]/ '[x]'$  Follow-up with PCP Additional Notes:   Recommended home care advise and patient would like diflucan ordered and sent to [facility] so they will deliver. Attempted to schedule appt and patient requesting PCP to prescribe medication . Please advise      Reason for Disposition  [1] Symptoms of a yeast infection (i.e., itchy, white discharge, not bad smelling) AND [2] feels like prior vaginal yeast infections  Answer Assessment - Initial Assessment Questions 1. SYMPTOM: "What's the main symptom you're concerned about?" (e.g., pain, itching, dryness)     "Feels like yeast infection coming on' 2. LOCATION: "Where is the  sx  located?" (e.g., inside/outside, left/right)     Vaginal area  3. ONSET: "When did the  sx  start?"     Since having shingles  did not give specific date or approx. Time  4. PAIN: "Is there any pain?" If Yes, ask: "How bad is it?" (Scale: 1-10; mild, moderate, severe)   -  MILD (1-3): Doesn't interfere with normal activities.    -  MODERATE (4-7): Interferes with normal activities (e.g., work or school) or awakens from sleep.     -  SEVERE (8-10):  Excruciating pain, unable to do any normal activities.     No  5. ITCHING: "Is there any itching?" If Yes, ask: "How bad is it?" (Scale: 1-10; mild, moderate, severe)     No  6. CAUSE: "What do you think is causing the discharge?" "Have you had the same problem before? What happened then?"     No discharge noted  7. OTHER SYMPTOMS: "Do you have any other symptoms?" (e.g., fever, itching, vaginal bleeding, pain with urination, injury to genital area, vaginal foreign body)     "Feels like yeast infection coming on "denies other sx  8. PREGNANCY: "Is there any chance you are pregnant?" "When was your last menstrual period?"     na  Protocols used: Vaginal Symptoms-A-AH

## 2021-12-13 NOTE — Telephone Encounter (Signed)
Routing to PCP for review.

## 2021-12-14 ENCOUNTER — Other Ambulatory Visit (HOSPITAL_COMMUNITY): Payer: Self-pay

## 2021-12-14 ENCOUNTER — Ambulatory Visit: Payer: Self-pay

## 2021-12-14 ENCOUNTER — Other Ambulatory Visit: Payer: Self-pay

## 2021-12-14 MED ORDER — FLUCONAZOLE 150 MG PO TABS
150.0000 mg | ORAL_TABLET | Freq: Once | ORAL | 1 refills | Status: AC
Start: 2021-12-14 — End: 2021-12-15
  Filled 2021-12-14 (×2): qty 1, 1d supply, fill #0

## 2021-12-14 NOTE — Telephone Encounter (Signed)
Call placed to pt and VM is currently full

## 2021-12-14 NOTE — Patient Instructions (Signed)
Visit Information  Thank you for taking time to visit with me today. Please don't hesitate to contact me if I can be of assistance to you.   Following are the goals we discussed today:   Goals Addressed             This Visit's Progress    Care Coordination Activities       Care Coordination Interventions: Assisted the patient with contacting her health plan to order products via over the counter catalog Order ID # [id number] will be delivered in 3-5 business days to the patients home Determined the patient would like assistance in the home with housekeeping Education provided on the [facility] in home aid program Voice message left with Inda Merlin requesting a return call to place the patient on the wait list          Our next appointment is by telephone on 12/6  Please call the care guide team at [phone] if you need to cancel or reschedule your appointment.   If you are experiencing a Mental Health or [place] or need someone to talk to, please call 1-800-273-TALK (toll free, 24 hour hotline)  The patient verbalized understanding of instructions, educational materials, and care plan provided today and DECLINED offer to receive copy of patient instructions, educational materials, and care plan.   Telephone follow up appointment with care management team member scheduled for:12/6  Daneen Schick, Arita Miss, CDP Social Worker, Certified Dementia Practitioner [place] Management  Care Coordination [phone]

## 2021-12-14 NOTE — Telephone Encounter (Signed)
Prescription for Diflucan has been sent to the pharmacy

## 2021-12-14 NOTE — Patient Outreach (Signed)
  Care Coordination   Follow Up Visit Note   12/14/2021 Name: Adrienne [name] MRN: [id number] DOB: [birth year]  Adrienne [name] is a 66 y.o. year old female who sees Charlott Rakes, MD for primary care. I spoke with  Adrienne [name] by phone today.  What matters to the patients health and wellness today?  Obtain items from over the counter catalog    Goals Addressed             This Visit's Progress    Care Coordination Activities       Care Coordination Interventions: Assisted the patient with contacting her health plan to order products via over the counter catalog Order ID # [id number] will be delivered in 3-5 business days to the patients home Determined the patient would like assistance in the home with housekeeping Education provided on the [facility] in home aid program Voice message left with Adrienne [name] requesting a return call to place the patient on the wait list          SDOH assessments and interventions completed:  No     Care Coordination Interventions:  Yes, provided   Follow up plan: Follow up call scheduled for 12/6    Encounter Outcome:  Pt. Visit Completed   Daneen Schick, Arita Miss, CDP Social Worker, Certified Dementia Practitioner [place] Management  Care Coordination [phone]

## 2021-12-14 NOTE — Addendum Note (Signed)
Addended by: Charlott Rakes on: 12/14/2021 09:44 AM   Modules accepted: Orders

## 2021-12-15 NOTE — Telephone Encounter (Signed)
Pt has been informed of medication being sent to pharmacy.

## 2021-12-16 ENCOUNTER — Other Ambulatory Visit: Payer: Self-pay

## 2021-12-17 ENCOUNTER — Ambulatory Visit: Payer: Self-pay

## 2021-12-17 NOTE — Patient Outreach (Signed)
  Care Coordination   Collaboration Note  Visit Note   12/17/2021 Name: Adrienne [name] MRN: [id number] DOB: [birth year]  Adrienne [name] is a 66 y.o. year old female who sees Charlott Rakes, MD for primary care. I  attempted to place the patient on the in home aid wait list.  What matters to the patients health and wellness today?      Goals Addressed             This Visit's Progress    Care Coordination Activities       Care Coordination Interventions: Voice message left with the in home aid program requesting a return call to place the patient on the wait list for the in home aid program SW will attempt another call over the next week          SDOH assessments and interventions completed:  No     Care Coordination Interventions:  Yes, provided   Follow up plan:  SW will continue to follow    Encounter Outcome:  No Answer   Daneen Schick, BSW, CDP Social Worker, Certified Dementia Practitioner [facility] Care Management  Care Coordination [phone]

## 2021-12-21 ENCOUNTER — Other Ambulatory Visit: Payer: Self-pay | Admitting: Family Medicine

## 2021-12-21 ENCOUNTER — Other Ambulatory Visit (HOSPITAL_COMMUNITY): Payer: Self-pay

## 2021-12-21 DIAGNOSIS — Z794 Long term (current) use of insulin: Secondary | ICD-10-CM

## 2021-12-21 DIAGNOSIS — E1169 Type 2 diabetes mellitus with other specified complication: Secondary | ICD-10-CM

## 2021-12-21 MED ORDER — CLOPIDOGREL BISULFATE 75 MG PO TABS
75.0000 mg | ORAL_TABLET | Freq: Every day | ORAL | 0 refills | Status: DC
  Filled 2021-12-21: qty 90, 90d supply, fill #0

## 2021-12-23 ENCOUNTER — Ambulatory Visit: Payer: Self-pay | Admitting: Pharmacist

## 2021-12-24 ENCOUNTER — Ambulatory Visit: Payer: Self-pay

## 2021-12-24 NOTE — Patient Outreach (Addendum)
  Care Coordination   Follow Up Visit Note   12/24/2021 Name: ANTHEA UDOVICH MRN: [id number] DOB: December 06, 1955  [name] is a 66 y.o. year old female who sees Charlott Rakes, MD for primary care. I spoke with  [name] by phone today.  What matters to the patients health and wellness today?  Doing well at this time.    Goals Addressed             This Visit's Progress    Care Coordination Activities       Care Coordination Interventions: Confirmed receipt of over the counter products Scheduled quarterly call to assist with future order Collaboration with Dorthy Cooler, supervisor on in home aid program via secure e-mail requesting to place patient on wait list In [place] call received from [facility] in home aid program - completed referral for wait list         SDOH assessments and interventions completed:  No     Care Coordination Interventions:  Yes, provided   Follow up plan: Follow up call scheduled for 02/24/22    Encounter Outcome:  Pt. Visit Completed   Daneen Schick, Arita Miss, CDP Social Worker, Certified Dementia Practitioner [place] Management  Care Coordination [phone]

## 2021-12-24 NOTE — Patient Instructions (Signed)
Visit Information  Thank you for taking time to visit with me today. Please don't hesitate to contact me if I can be of assistance to you.   Following are the goals we discussed today:   Goals Addressed             This Visit's Progress    Care Coordination Activities       Care Coordination Interventions: Confirmed receipt of over the counter products Scheduled quarterly call to assist with future order Collaboration with Dorthy Cooler, supervisor on in home aid program via secure e-mail requesting to place patient on wait list         Our next appointment is by telephone on 02/24/22 at 1:00  Please call the care guide team at [phone] if you need to cancel or reschedule your appointment.   If you are experiencing a Mental Health or [place] or need someone to talk to, please call 1-800-273-TALK (toll free, 24 hour hotline)  The patient verbalized understanding of instructions, educational materials, and care plan provided today and DECLINED offer to receive copy of patient instructions, educational materials, and care plan.   Telephone follow up appointment with care management team member scheduled for:02/24/22  Daneen Schick, Arita Miss, CDP Social Worker, Certified Dementia Practitioner [place] Management  Care Coordination [phone]

## 2022-01-04 ENCOUNTER — Other Ambulatory Visit: Payer: Self-pay

## 2022-01-04 ENCOUNTER — Other Ambulatory Visit (HOSPITAL_COMMUNITY): Payer: Self-pay

## 2022-01-05 IMAGING — MG MM DIGITAL SCREENING BILAT W/ TOMO AND CAD
8 series · 8 of 24 positions shown · non-contrast
Comparison: Previous exam(s).

CLINICAL DATA: Screening.

EXAM:
DIGITAL SCREENING BILATERAL MAMMOGRAM WITH TOMOSYNTHESIS AND CAD
TECHNIQUE: Bilateral screening digital craniocaudal and mediolateral oblique
mammograms were obtained. Bilateral screening digital breast
tomosynthesis was performed. The images were evaluated with
computer-aided detection.

[R CC synth-2D]
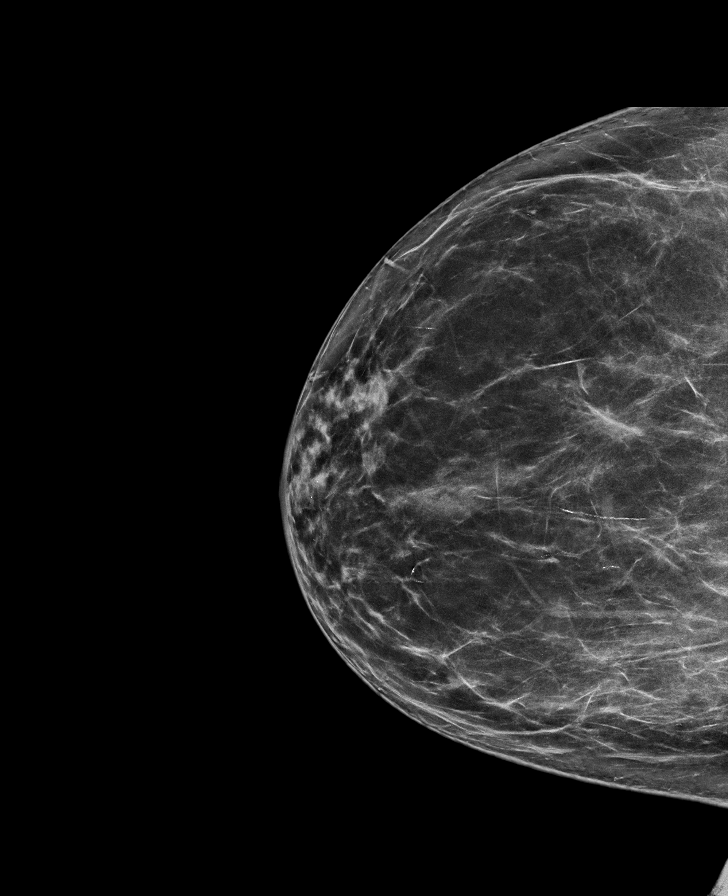

[L CC synth-2D]
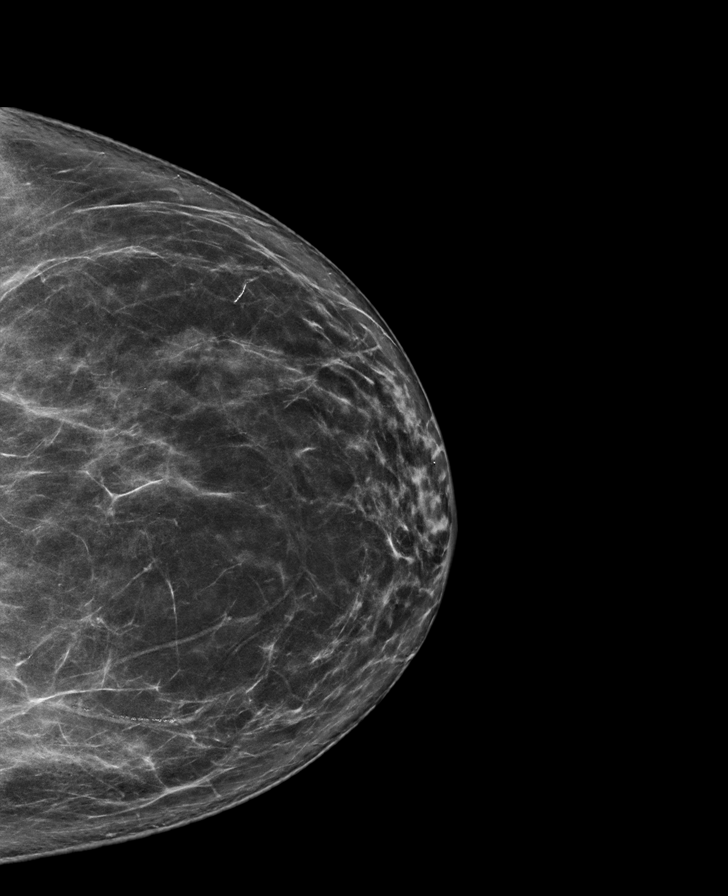

[R MLO synth-2D]
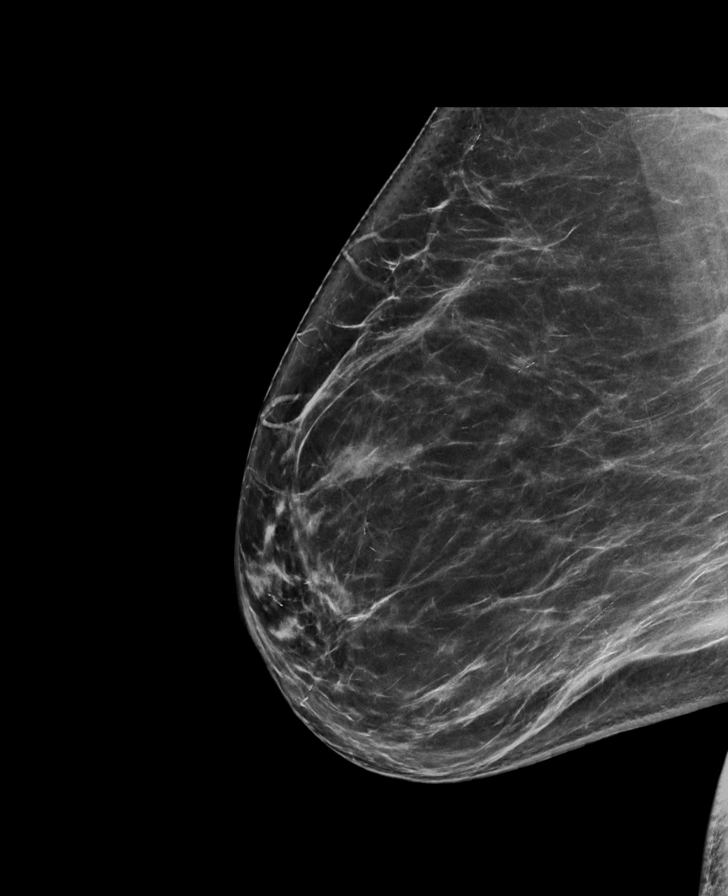

[L MLO synth-2D]
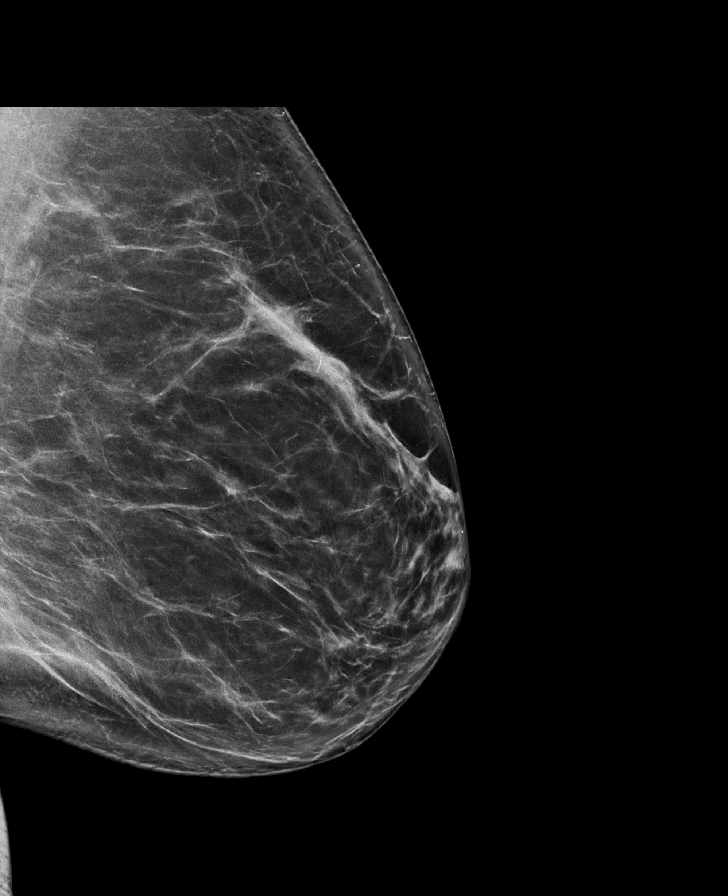

[R CC tomo · tomo slice 40/79.0]
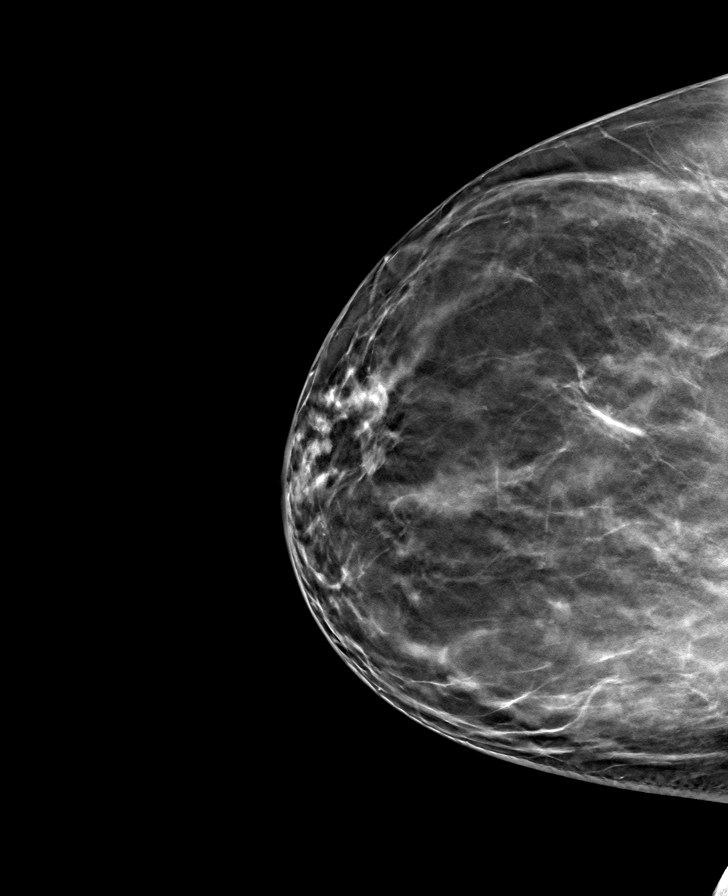

[R MLO tomo · tomo slice 47/94.0]
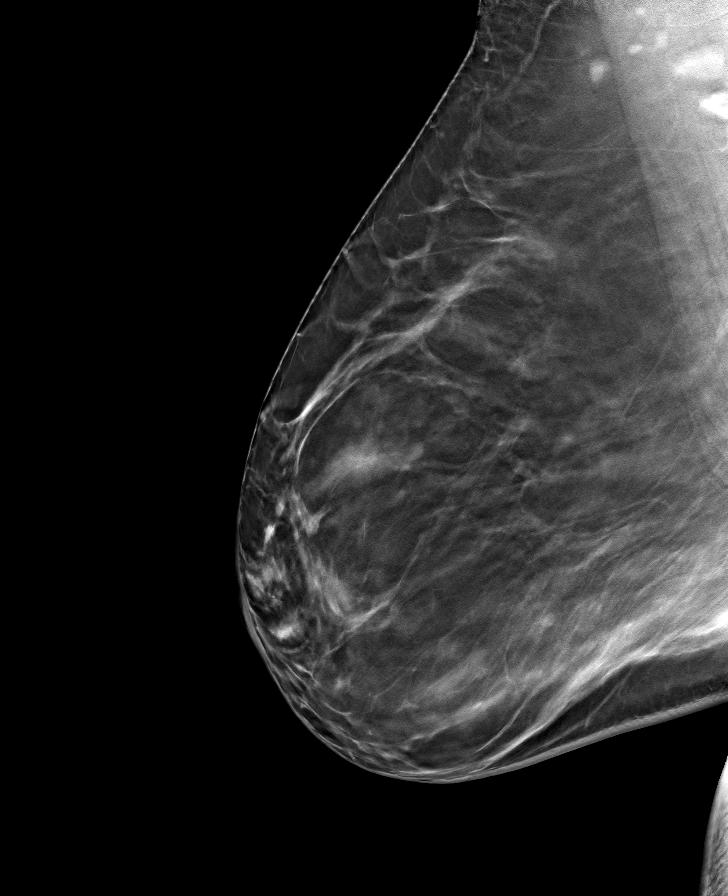

[L CC tomo · tomo slice 42/83.0]
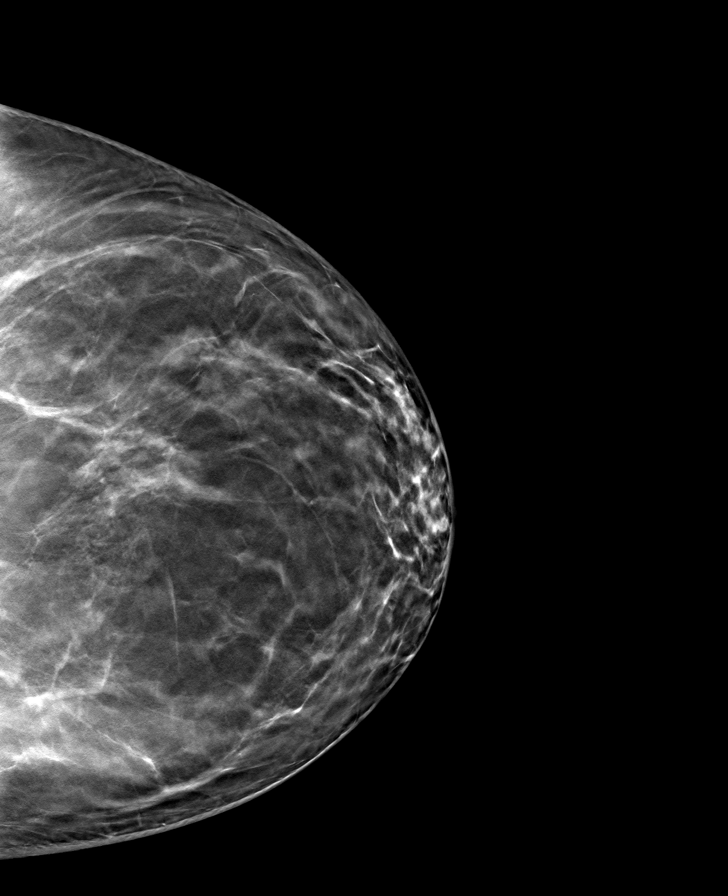

[L MLO tomo · tomo slice 46/91.0]
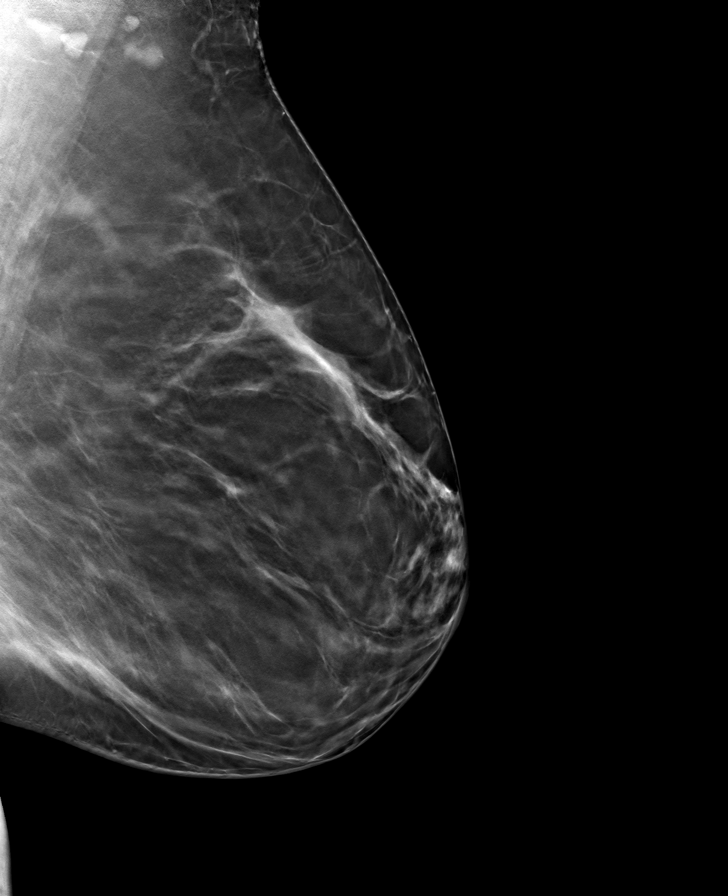

[8 of 24 positions shown; findings below may reference images not displayed]

ACR Breast Density Category b: There are scattered areas of
fibroglandular density.
FINDINGS: In the right breast, calcifications warrant further evaluation with
magnified views. In the left breast, no findings suspicious for
malignancy.
IMPRESSION: Further evaluation is suggested for calcifications in the right
breast.

RECOMMENDATION:
Diagnostic mammogram of the right breast. (Code:[id number])

The patient will be contacted regarding the findings, and additional
imaging will be scheduled.

BI-RADS CATEGORY  0: Incomplete. Need additional imaging evaluation
and/or prior mammograms for comparison.

## 2022-01-19 DIAGNOSIS — Z62820 Parent-biological child conflict: Secondary | ICD-10-CM | POA: Diagnosis not present

## 2022-01-19 DIAGNOSIS — F0631 Mood disorder due to known physiological condition with depressive features: Secondary | ICD-10-CM | POA: Diagnosis not present

## 2022-01-19 DIAGNOSIS — R69 Illness, unspecified: Secondary | ICD-10-CM | POA: Diagnosis not present

## 2022-01-24 DIAGNOSIS — Z62820 Parent-biological child conflict: Secondary | ICD-10-CM | POA: Diagnosis not present

## 2022-01-24 DIAGNOSIS — R69 Illness, unspecified: Secondary | ICD-10-CM | POA: Diagnosis not present

## 2022-01-24 DIAGNOSIS — F0631 Mood disorder due to known physiological condition with depressive features: Secondary | ICD-10-CM | POA: Diagnosis not present

## 2022-01-26 ENCOUNTER — Encounter: Payer: Self-pay | Admitting: Family Medicine

## 2022-01-26 ENCOUNTER — Other Ambulatory Visit: Payer: Self-pay

## 2022-01-26 ENCOUNTER — Ambulatory Visit: Payer: Medicare HMO | Attending: Family Medicine | Admitting: Family Medicine

## 2022-01-26 VITALS — BP 143/82 | HR 76 | Ht 66.0 in | Wt 200.0 lb

## 2022-01-26 DIAGNOSIS — Z1211 Encounter for screening for malignant neoplasm of colon: Secondary | ICD-10-CM

## 2022-01-26 DIAGNOSIS — E1122 Type 2 diabetes mellitus with diabetic chronic kidney disease: Secondary | ICD-10-CM

## 2022-01-26 DIAGNOSIS — I129 Hypertensive chronic kidney disease with stage 1 through stage 4 chronic kidney disease, or unspecified chronic kidney disease: Secondary | ICD-10-CM

## 2022-01-26 DIAGNOSIS — E1169 Type 2 diabetes mellitus with other specified complication: Secondary | ICD-10-CM

## 2022-01-26 DIAGNOSIS — K219 Gastro-esophageal reflux disease without esophagitis: Secondary | ICD-10-CM | POA: Diagnosis not present

## 2022-01-26 DIAGNOSIS — E785 Hyperlipidemia, unspecified: Secondary | ICD-10-CM | POA: Diagnosis not present

## 2022-01-26 DIAGNOSIS — Z794 Long term (current) use of insulin: Secondary | ICD-10-CM

## 2022-01-26 LAB — GLUCOSE, POCT (MANUAL RESULT ENTRY): POC Glucose: 135 mg/dl — AB (ref 70–99)

## 2022-01-26 MED ORDER — INSULIN GLARGINE 100 UNIT/ML SOLOSTAR PEN
PEN_INJECTOR | SUBCUTANEOUS | 6 refills | Status: DC
  Filled 2022-01-26: qty 30, 49d supply, fill #0
  Filled 2022-03-05: qty 30, 49d supply, fill #1
  Filled 2022-05-11: qty 30, 49d supply, fill #0

## 2022-01-26 MED ORDER — FAMOTIDINE 20 MG PO TABS
20.0000 mg | ORAL_TABLET | Freq: Two times a day (BID) | ORAL | 1 refills | Status: DC
  Filled 2022-01-26: qty 180, 90d supply, fill #0
  Filled 2022-03-05 – 2022-05-02 (×2): qty 180, 90d supply, fill #1

## 2022-01-26 MED ORDER — ZOSTER VAC RECOMB ADJUVANTED 50 MCG/0.5ML IM SUSR
0.5000 mL | Freq: Once | INTRAMUSCULAR | 1 refills | Status: AC
  Filled 2022-01-26: qty 0.5, 1d supply, fill #0
  Filled 2022-05-02: qty 0.5, 1d supply, fill #1

## 2022-01-26 MED ORDER — METFORMIN HCL 500 MG PO TABS
500.0000 mg | ORAL_TABLET | Freq: Two times a day (BID) | ORAL | 1 refills | Status: DC
  Filled 2022-01-26 – 2022-03-31 (×2): qty 180, 90d supply, fill #0
  Filled 2022-07-15: qty 180, 90d supply, fill #1

## 2022-01-26 MED ORDER — LOSARTAN POTASSIUM-HCTZ 100-25 MG PO TABS
1.0000 | ORAL_TABLET | Freq: Every day | ORAL | 1 refills | Status: DC
  Filled 2022-01-26 – 2022-03-31 (×2): qty 90, 90d supply, fill #0
  Filled 2022-06-27: qty 90, 90d supply, fill #1

## 2022-01-26 MED ORDER — CLOPIDOGREL BISULFATE 75 MG PO TABS
75.0000 mg | ORAL_TABLET | Freq: Every day | ORAL | 1 refills | Status: DC
  Filled 2022-01-26 – 2022-03-05 (×2): qty 90, 90d supply, fill #0
  Filled 2022-06-27: qty 90, 90d supply, fill #1

## 2022-01-26 MED ORDER — ATORVASTATIN CALCIUM 80 MG PO TABS
80.0000 mg | ORAL_TABLET | Freq: Every day | ORAL | 1 refills | Status: DC
  Filled 2022-01-26 – 2022-03-05 (×2): qty 90, 90d supply, fill #0
  Filled 2022-07-15: qty 90, 90d supply, fill #1

## 2022-01-26 NOTE — Patient Instructions (Signed)

## 2022-01-26 NOTE — Progress Notes (Signed)
Subjective:  Patient ID: Adrienne [name], female    DOB: 14-Apr-1955  Age: 67 y.o. MRN: [id number]  CC: Diabetes   HPI QUINNLEY COLASURDO is a 67 y.o. 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   Interval History:  She Complains of gas in her stomach especially after eating late and this goes up her back. She has to eat prior to 6pm to prevent symptoms and she has been taking GasX. Drinking Ginger ale provides relief. Eating veggies and diary exacerbate her symptoms.  She has had hypoglycemic symptoms but is unaware of the value as her glucometer does not have an alarm and she is visually impaired. She always has sweets beside her which she takes and then the next day she reduces her evening Lantus dose to 25 down from 28. She has been sedentary and not exercising and eating a lot of candy. Endorses adherence with her antihypertensives which need to be adjusted due to the fact that her blood pressure was previously uncontrolled at her last visit. Past Medical History:  Diagnosis Date   Blindness of both eyes    Diabetes ([place])    High cholesterol    Hypertension     Past Surgical History:  Procedure Laterality Date   CATARACT EXTRACTION, BILATERAL     Laser eye surgery, cataract surgery bi-lat   CESAREAN SECTION     Twins, [place]   EYE SURGERY Bilateral 2012   LASER, CATARACT   TEE WITHOUT CARDIOVERSION N/A 12/31/2013   Procedure: TRANSESOPHAGEAL ECHOCARDIOGRAM (TEE);  Surgeon: Laverda Page, MD;  Location: [place];  Service: Cardiovascular;  Laterality: N/A;  H&P in file   TUBAL LIGATION Bilateral     Family History  Problem Relation Age of  Onset   Dementia Mother    Diabetes Father    Hypertension Father    Hypertension Sister    Diabetes Sister    Diabetes Sister    Heart disease Sister     Social History   Socioeconomic History   Marital status: Divorced    Spouse name: Not on file   Number of children: 3   Years of education: 12   Highest education level: 12th grade  Occupational History    Employer: [employer]  Tobacco Use   Smoking status: Former    Passive exposure: Past   Smokeless tobacco: Never   Tobacco comments:    QUIT IN 1987  Vaping Use   Vaping Use: Never used  Substance and Sexual Activity   Alcohol use: No    Alcohol/week: 0.0 standard drinks of alcohol    Comment: QUIT IN 1987   Drug use: No    Frequency: 2.0 times per week    Comment: QUIT 1980   Sexual activity: Not Currently  Other Topics Concern   Not on file  Social History Narrative   Patient is divorced with 3 children.   Patient is right handed.   Patient has 12 th grade education.   Patient has not been drinking caffeine.   Social Determinants of Health   Financial Resource Strain: Medium Risk (08/24/2021)   Overall Financial Resource Strain (CARDIA)    Difficulty of Paying Living Expenses: Somewhat hard  Food Insecurity: No Food Insecurity (08/23/2021)   Hunger Vital Sign    Worried About Running Out of Food in the Last Year: Never true    Ran Out of Food in the Last Year: Never true  Transportation Needs: No Transportation Needs (08/23/2021)   PRAPARE - Hydrologist (Medical): No    Lack of Transportation (Non-Medical): No  Physical Activity: Inactive (03/01/2021)   Exercise Vital Sign    Days of Exercise per Week: 0 days    Minutes of Exercise per Session: 0 min  Stress: No Stress Concern Present (03/01/2021)   [place]    Feeling of Stress : Only a little  Social Connections: Moderately Integrated (03/01/2021)    Social Connection and Isolation Panel [NHANES]    Frequency of Communication with Friends and Family: More than three times a week    Frequency of Social Gatherings with Friends and Family: More than three times a week    Attends Religious Services: More than 4 times per year    Active Member of [employer] or Organizations: Yes    Attends Music therapist: More than 4 times per year    Marital Status: Divorced    Allergies  Allergen Reactions   Codeine Itching   Hydrocodone Itching   Oxycodone Itching   Penicillins Itching   Clindamycin/Lincomycin Itching   Niacin And Related Itching   Penicillin G Itching   Niacin Itching and Anxiety    Facial flushing    Outpatient Medications Prior to Visit  Medication Sig Dispense Refill   acetaminophen (TYLENOL) 500 MG tablet Take 500 mg by mouth every 6 (six) hours as needed for moderate pain.     aspirin EC 81 MG tablet Take 81 mg by mouth daily.      cetirizine (ZYRTEC) 10 MG tablet Take 1 tablet (10 mg total) by mouth daily. 30 tablet 1   cholecalciferol (VITAMIN D3) 25 MCG (1000 UNIT) tablet Take 1 tablet (1,000 Units total) by mouth daily. 30 tablet 3   Continuous Blood Gluc Receiver (FREESTYLE LIBRE 2 READER) DEVI Please use to check blood sugar three times daily. E11.69 1 each 0   Continuous Blood Gluc Sensor (FREESTYLE LIBRE 2 SENSOR) MISC Please use to check blood sugar three times daily. Change sensors once every 14 days. E11.69 2 each 3   diclofenac Sodium (VOLTAREN) 1 % GEL Apply 4 g topically 4 (four) times daily. 100 g 1   fluticasone (FLONASE) 50 MCG/ACT nasal spray PLACE 2 SPRAYS INTO BOTH NOSTRILS DAILY. 16 g 2   guaiFENesin-dextromethorphan (ROBITUSSIN DM) 100-10 MG/5ML syrup Take 5 mLs by mouth every 6 (six) hours as needed for cough. 118 mL 0   Insulin Pen Needle 31G X 5 MM MISC use as directed 4 (four) times daily - after meals and at bedtime. 100 each 3   metoCLOPramide (REGLAN) 5 MG tablet Take 1 tablet (5 mg  total) by mouth every 8 (eight) hours as needed for nausea. 90 tablet 1   Misc. Devices MISC Blood pressure monitor. Dx: Hypertension 1 each 0   atorvastatin (LIPITOR) 80 MG tablet Take 1 tablet (80 mg total) by mouth daily. 90 tablet 1   clopidogrel (PLAVIX) 75 MG tablet Take 1 tablet (75 mg total) by mouth daily. 90 tablet 0   famotidine (PEPCID) 20 MG tablet Take 1 tablet (20 mg total) by mouth 2 (two) times daily. 180 tablet 1  insulin glargine (LANTUS) 100 UNIT/ML Solostar Pen Inject 33 units in the am and 28 in the evening 30 mL 6   losartan-hydrochlorothiazide (HYZAAR) 100-25 MG tablet TAKE 1 TABLET BY MOUTH DAILY. 90 tablet 1   metFORMIN (GLUCOPHAGE) 500 MG tablet Take 1 tablet (500 mg total) by mouth 2 (two) times daily with a meal. 180 tablet 1   predniSONE (DELTASONE) 20 MG tablet Take 1 tablet (20 mg total) by mouth daily with breakfast. (Patient not taking: Reported on 01/26/2022) 5 tablet 0   No facility-administered medications prior to visit.     ROS Review of Systems  Constitutional:  Negative for activity change and appetite change.  HENT:  Negative for sinus pressure and sore throat.   Eyes:  Positive for visual disturbance.  Respiratory:  Negative for chest tightness, shortness of breath and wheezing.   Cardiovascular:  Negative for chest pain and palpitations.  Gastrointestinal:  Negative for abdominal distention, abdominal pain and constipation.  Genitourinary: Negative.   Musculoskeletal: Negative.   Psychiatric/Behavioral:  Negative for behavioral problems and dysphoric mood.     Objective:  BP (!) 143/82   Pulse 76   Ht '5\' 6"'$  (1.676 m)   Wt 200 lb (90.7 kg)   SpO2 100%   BMI 32.28 kg/m      01/26/2022    2:40 PM 11/22/2021    4:49 PM 11/22/2021    4:03 PM  BP/Weight  Systolic BP 638 756 433  Diastolic BP 82 90 82  Wt. (Lbs) 200  200.6  BMI 32.28 kg/m2  32.38 kg/m2      Physical Exam Constitutional:      Appearance: She is well-developed.   Eyes:     Comments: Visually impaired  Cardiovascular:     Rate and Rhythm: Normal rate.     Heart sounds: Normal heart sounds. No murmur heard. Pulmonary:     Effort: Pulmonary effort is normal.     Breath sounds: Normal breath sounds. No wheezing or rales.  Chest:     Chest wall: No tenderness.  Abdominal:     General: Bowel sounds are normal. There is no distension.     Palpations: Abdomen is soft. There is no mass.     Tenderness: There is no abdominal tenderness.  Musculoskeletal:        General: Normal range of motion.     Right lower leg: No edema.     Left lower leg: No edema.  Neurological:     Mental Status: She is alert and oriented to person, place, and time.  Psychiatric:        Mood and Affect: Mood normal.        Latest Ref Rng & Units 08/02/2021    4:00 PM 04/20/2021    4:07 PM 01/19/2021    4:01 PM  CMP  Glucose 70 - 99 mg/dL 295  225  383   BUN 8 - 27 mg/dL 25  35  19   Creatinine 0.57 - 1.00 mg/dL 1.75  1.79  1.66   Sodium 134 - 144 mmol/L 138  139  137   Potassium 3.5 - 5.2 mmol/L 4.6  4.2  4.5   Chloride 96 - 106 mmol/L 100  100  99   CO2 20 - 29 mmol/L '22  22  21   '$ Calcium 8.7 - 10.3 mg/dL 9.4  10.0  9.8   Total Protein 6.0 - 8.5 g/dL 6.9  6.8  7.2   Total Bilirubin 0.0 -  1.2 mg/dL <0.2  0.3  0.3   Alkaline Phos 44 - 121 IU/L 105  101  143   AST 0 - 40 IU/L '15  16  15   '$ ALT 0 - 32 IU/L '19  17  14     '$ Lipid Panel     Component Value Date/Time   CHOL 202 (H) 05/07/2019 1049   TRIG 122 05/07/2019 1049   HDL 46 05/07/2019 1049   CHOLHDL 4.4 05/07/2019 1049   CHOLHDL 2.7 01/04/2016 1138   VLDL 12 01/04/2016 1138   LDLCALC 134 (H) 05/07/2019 1049    CBC    Component Value Date/Time   WBC 9.7 10/19/2020 1649   WBC 9.6 02/13/2019 2030   RBC 3.71 (L) 10/19/2020 1649   RBC 3.14 (L) 02/13/2019 2030   HGB 11.5 10/19/2020 1649   HCT 33.4 (L) 10/19/2020 1649   PLT 508 (H) 10/19/2020 1649   MCV 90 10/19/2020 1649   MCH 31.0 10/19/2020 1649    MCH 30.6 02/13/2019 2030   MCHC 34.4 10/19/2020 1649   MCHC 32.9 02/13/2019 2030   RDW 11.8 10/19/2020 1649   LYMPHSABS 2.7 10/19/2020 1649   MONOABS 0.4 02/13/2019 2030   EOSABS 0.3 10/19/2020 1649   BASOSABS 0.1 10/19/2020 1649    Lab Results  Component Value Date   HGBA1C 9.5 (A) 11/22/2021    Assessment & Plan:  1. Type 2 diabetes mellitus with other specified complication, with long-term current use of insulin (HCC) Uncontrolled with A1c of 9.5 Due to the fact that she is hypoglycemic and is visually impaired I have adjusted her insulin regimen and decreased her nighttime Lantus dose from 20 to 26 units and increased her morning Lantus dose from 33 units to 35 units She does not have her CGM data with her Counseled on Diabetic diet, my plate method, 382 minutes of moderate intensity exercise/week Blood sugar logs with fasting goals of 80-120 mg/dl, random of less than 180 and in the event of sugars less than 60 mg/dl or greater than 400 mg/dl encouraged to notify the clinic. Advised on the need for annual eye exams, annual foot exams, Pneumonia vaccine. - POCT glucose (manual entry) - CMP14+EGFR; Future - CBC with Differential/Platelet; Future - LP+Non-HDL Cholesterol; Future - Microalbumin/Creatinine Ratio, Urine; Future - insulin glargine (LANTUS) 100 UNIT/ML Solostar Pen; Inject 35 Units into the skin in the morning AND 26 Units every evening.  Dispense: 30 mL; Refill: 6 - clopidogrel (PLAVIX) 75 MG tablet; Take 1 tablet (75 mg total) by mouth daily.  Dispense: 90 tablet; Refill: 1 - metFORMIN (GLUCOPHAGE) 500 MG tablet; Take 1 tablet (500 mg total) by mouth 2 (two) times daily with a meal.  Dispense: 180 tablet; Refill: 1  2. Gastroesophageal reflux disease without esophagitis Uncontrolled She has not been taking Pepcid We will restart this Advised to avoid recumbency up to 2 hours postmeal, avoid late meals, avoid foods that trigger symptoms. - famotidine (PEPCID) 20 MG  tablet; Take 1 tablet (20 mg total) by mouth 2 (two) times daily.  Dispense: 180 tablet; Refill: 1  3. Screening for colon cancer - Ambulatory referral to Gastroenterology  4. Hyperlipidemia associated with type 2 diabetes mellitus ([place]) Uncontrolled from last set of labs She is due for repeat lipid panel Low-cholesterol diet - atorvastatin (LIPITOR) 80 MG tablet; Take 1 tablet (80 mg total) by mouth daily.  Dispense: 90 tablet; Refill: 1 - clopidogrel (PLAVIX) 75 MG tablet; Take 1 tablet (75 mg total) by mouth  daily.  Dispense: 90 tablet; Refill: 1  5. 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  Dispense: 90 tablet; Refill: 1    Meds ordered this encounter  Medications   Zoster Vaccine Adjuvanted [facility]) injection    Sig: Inject 0.5 mLs into the muscle once for 1 dose.    Dispense:  0.5 mL    Refill:  1   famotidine (PEPCID) 20 MG tablet    Sig: Take 1 tablet (20 mg total) by mouth 2 (two) times daily.    Dispense:  180 tablet    Refill:  1    Discontinue Zantac   insulin glargine (LANTUS) 100 UNIT/ML Solostar Pen    Sig: Inject 35 Units into the skin in the morning AND 26 Units every evening.    Dispense:  30 mL    Refill:  6    Dose increase   atorvastatin (LIPITOR) 80 MG tablet    Sig: Take 1 tablet (80 mg total) by mouth daily.    Dispense:  90 tablet    Refill:  1   clopidogrel (PLAVIX) 75 MG tablet    Sig: Take 1 tablet (75 mg total) by mouth daily.    Dispense:  90 tablet    Refill:  1   losartan-hydrochlorothiazide (HYZAAR) 100-25 MG tablet    Sig: TAKE 1 TABLET BY MOUTH DAILY.    Dispense:  90 tablet    Refill:  1   metFORMIN (GLUCOPHAGE) 500 MG tablet    Sig: Take 1 tablet (500 mg total) by mouth 2 (two) times daily with a meal.    Dispense:  180 tablet     Refill:  1    Dose decreased due to abnormal kidney function    Follow-up: Return in about 3 months (around 04/27/2022).       Charlott Rakes, MD, FAAFP. [facility] and [place], [place]   01/26/2022, 5:42 PM

## 2022-01-27 ENCOUNTER — Other Ambulatory Visit: Payer: Self-pay

## 2022-01-28 ENCOUNTER — Ambulatory Visit: Payer: Self-pay

## 2022-01-28 NOTE — Patient Outreach (Signed)
  Care Coordination   01/28/2022 Name: Adrienne [name] MRN: [id number] DOB: 09/19/1955   Care Coordination Outreach Attempts:  An unsuccessful telephone outreach was attempted for a scheduled appointment today.  Follow Up Plan:  Additional outreach attempts will be made to offer the patient care coordination information and services.   Encounter Outcome:  No Answer   Care Coordination Interventions:  No, not indicated    Barb Merino, RN, BSN, CCM Care Management Coordinator [facility] Care Management Direct Phone: [phone]

## 2022-01-31 DIAGNOSIS — Z62820 Parent-biological child conflict: Secondary | ICD-10-CM | POA: Diagnosis not present

## 2022-01-31 DIAGNOSIS — F0631 Mood disorder due to known physiological condition with depressive features: Secondary | ICD-10-CM | POA: Diagnosis not present

## 2022-01-31 DIAGNOSIS — R69 Illness, unspecified: Secondary | ICD-10-CM | POA: Diagnosis not present

## 2022-02-02 ENCOUNTER — Ambulatory Visit: Payer: Self-pay

## 2022-02-02 NOTE — Patient Outreach (Signed)
  Care Coordination   Follow Up Visit Note   02/02/2022 Name: Adrienne [name] MRN: [id number] DOB: 1955-02-28  Adrienne [name] is a 67 y.o. year old female who sees Charlott Rakes, MD for primary care. I spoke with  Adrienne [name] by phone today.  What matters to the patients health and wellness today?  Patient would like help with learning more about her OTC benefits. She would also like resources for transportation.     Goals Addressed               This Visit's Progress     Patient Stated     I want to improve my diabetes (pt-stated)        Care Coordination Interventions: Evaluation of current treatment plan related to Diabetes Mellitus and patient's adherence to plan as established by provider Determined patient completed recent PCP follow up to review her diabetes regimen Review of patient status, including review of consultant's reports, relevant laboratory and other test results, and medications completed Sent in basket message to [place] to assist with OTC benefits and resources for transportation         SDOH assessments and interventions completed:  No     Care Coordination Interventions:  Yes, provided   Follow up plan: Follow up call scheduled for 03/31/22 '@10'$ :30 AM    Encounter Outcome:  Pt. Visit Completed

## 2022-02-02 NOTE — Patient Instructions (Signed)
Visit Information  Thank you for taking time to visit with me today. Please don't hesitate to contact me if I can be of assistance to you.   Following are the goals we discussed today:   Goals Addressed               This Visit's Progress     Patient Stated     I want to improve my diabetes (pt-stated)        Care Coordination Interventions: Evaluation of current treatment plan related to Diabetes Mellitus and patient's adherence to plan as established by provider Determined patient completed recent PCP follow up to review her diabetes regimen Review of patient status, including review of consultant's reports, relevant laboratory and other test results, and medications completed Sent in basket message to [place] to assist with OTC benefits and resources for transportation         Our next appointment is by telephone on 03/31/22 at 10:30 AM  Please call the care guide team at [phone] if you need to cancel or reschedule your appointment.   If you are experiencing a Mental Health or [place] or need someone to talk to, please go to [facility] [place] [phone])  The patient verbalized understanding of instructions, educational materials, and care plan provided today and agreed to receive a mailed copy of patient instructions, educational materials, and care plan.   Barb Merino, RN, BSN, CCM Care Management Coordinator [facility] Care Management Direct Phone: [phone]

## 2022-02-07 DIAGNOSIS — R69 Illness, unspecified: Secondary | ICD-10-CM | POA: Diagnosis not present

## 2022-02-07 DIAGNOSIS — Z62820 Parent-biological child conflict: Secondary | ICD-10-CM | POA: Diagnosis not present

## 2022-02-07 DIAGNOSIS — F0631 Mood disorder due to known physiological condition with depressive features: Secondary | ICD-10-CM | POA: Diagnosis not present

## 2022-02-14 DIAGNOSIS — Z62820 Parent-biological child conflict: Secondary | ICD-10-CM | POA: Diagnosis not present

## 2022-02-14 DIAGNOSIS — F0631 Mood disorder due to known physiological condition with depressive features: Secondary | ICD-10-CM | POA: Diagnosis not present

## 2022-02-14 DIAGNOSIS — R69 Illness, unspecified: Secondary | ICD-10-CM | POA: Diagnosis not present

## 2022-02-16 ENCOUNTER — Telehealth: Payer: Self-pay | Admitting: *Deleted

## 2022-02-16 ENCOUNTER — Telehealth: Payer: Self-pay

## 2022-02-16 NOTE — Telephone Encounter (Unsigned)
Attempt to call pt to verify if on blood thinner. Unable to reach pt. VM was full so no message was left.

## 2022-02-16 NOTE — Telephone Encounter (Signed)
Pre-operative Risk Assessment     Request for surgical clearance:     Endoscopy Procedure  What type of surgery is being performed?     Colonoscopy   When is this surgery scheduled?     03/09/22  What type of clearance is required ?   Pharmacy  Are there any medications that need to be held prior to surgery and how long? Plavix to be held 5 to 7 days prior to colonoscopy - ASA is okay  Practice name and name of physician performing surgery?      [facility] by Dr Silverio Decamp, MD  What is your office phone and fax number?      Phone- [phone]  [phone]  Anesthesia type (None, local, MAC, general) ?       MAC    Route back to [facility], LPN  Thank you

## 2022-02-16 NOTE — Telephone Encounter (Signed)
Dr Margarita Rana has responded. See the medication management telephone encounter please.

## 2022-02-16 NOTE — Telephone Encounter (Signed)
Okay to hold Plavix for 5 days

## 2022-02-17 ENCOUNTER — Ambulatory Visit (AMBULATORY_SURGERY_CENTER): Payer: Medicare HMO | Admitting: *Deleted

## 2022-02-17 ENCOUNTER — Other Ambulatory Visit: Payer: Self-pay

## 2022-02-17 VITALS — Ht 66.0 in | Wt 200.0 lb

## 2022-02-17 DIAGNOSIS — Z1211 Encounter for screening for malignant neoplasm of colon: Secondary | ICD-10-CM

## 2022-02-17 MED ORDER — PEG 3350-KCL-NA BICARB-NACL 420 G PO SOLR
4000.0000 mL | Freq: Once | ORAL | 0 refills | Status: AC
  Filled 2022-02-17: qty 4000, 1d supply, fill #0

## 2022-02-17 NOTE — Progress Notes (Signed)
No egg or soy allergy known to patient  No issues known to pt with past sedation with any surgeries or procedures Patient denies ever being intubated No issues with moving head or neck No issues with swallowing  No FH of Malignant Hyperthermia Pt is not on diet pills Pt is not on  home 02  Pt is not on blood thinners  Pt states issues with constipation  Instructed to use Miralax per instructions starting 03/03/22 Pt is not on dialysis Pt denies any upcoming cardiac testing Pt encouraged to use to use Singlecare or Goodrx to reduce cost  Patient's chart reviewed by Osvaldo Angst CNRA prior to previsit and patient appropriate for the [address].  Previsit completed and red dot placed by patient's name on their procedure day (on provider's schedule).  . Visit by phone Pt is blind uses cain for mobility Instructed patient to check BS often  through the day  and right before bedtime to  prevent any low BS levels. Pt states she has regular Ginger Ale to drink if BS is low. Suggested having someone stay with her the night before the procedure as well as the expectaion of someone staying with her after the procedure. Pt states she will have someone.  Dr Margarita Rana order given on 02/16/22 regarding Plavix Hold for 5 days prior to procedure Instructions reviewed with pt and pt states understanding. Instructed to review again prior to procedure. Pt states they will.  Instructions sent by mail.

## 2022-02-21 DIAGNOSIS — F0631 Mood disorder due to known physiological condition with depressive features: Secondary | ICD-10-CM | POA: Diagnosis not present

## 2022-02-21 DIAGNOSIS — R69 Illness, unspecified: Secondary | ICD-10-CM | POA: Diagnosis not present

## 2022-02-21 DIAGNOSIS — Z62820 Parent-biological child conflict: Secondary | ICD-10-CM | POA: Diagnosis not present

## 2022-02-24 ENCOUNTER — Ambulatory Visit: Payer: Self-pay

## 2022-02-24 ENCOUNTER — Other Ambulatory Visit: Payer: Self-pay

## 2022-02-24 NOTE — Patient Instructions (Signed)
Visit Information  Thank you for taking time to visit with me today. Please don't hesitate to contact me if I can be of assistance to you.   Following are the goals we discussed today:   Goals Addressed             This Visit's Progress    Care Coordination Activities   On track    Care Coordination Interventions: Collaboration with RN Care Manager who indicates patient has switched her health plan to [facility] this plan year and has questions regarding transportation and OTC benefit Performed chart review to note patient has Aetna Value Plus [facility]) Plan Reviewed summary of benefits prior to call with patient to determine patient has access to transportation which covers 24 one way trips. Patient also has a quarterly over the counter benefit of $105 Discussed benefits with the patient to determine patient will continue utilizing [employer] for transportation as she feels it is safer due to her vision impairment and concern with the timeframe she may have to wait prior to being picked up when using health plan transportation benefit Patient reports she was under the impression her quarterly benefit is $180 that she could also use on utilities Assisted the patient in contacting her health plan; determined patient has access to both OTC benefit as well as an "extra benefits card" OTC benefit: $105 quarterly for patient to order via catalog Extra benefits card: $180 quarterly. May be used on healthy food, transportation, utilities, personal care items, pet supplies, rent or mortgage Scheduled appointment on 2/13 to assist patient with ordering items via over the counter catalog. Patient is knowledgeable of how to access extra benefits funds and reports she has already used this quarters allowance towards utility costs         Our next appointment is by telephone on 2/13 at 3:00 pm  Please call the care guide team at [phone] if you need to cancel or reschedule your appointment.   If you are  experiencing a Mental Health or [place] or need someone to talk to, please call [phone]  The patient verbalized understanding of instructions, educational materials, and care plan provided today and DECLINED offer to receive copy of patient instructions, educational materials, and care plan.   Telephone follow up appointment with care management team member scheduled for:2/13  Daneen Schick, Arita Miss, CDP Social Worker, Certified Dementia Practitioner [place] [facility]  Care Coordination [phone]

## 2022-02-24 NOTE — Patient Outreach (Signed)
  Care Coordination   Follow Up Visit Note   02/24/2022 Name: Adrienne [name] MRN: [id number] DOB: [birth year]  Adrienne [name] is a 67 y.o. year old female who sees Charlott Rakes, MD for primary care. I spoke with  Adrienne [name] by phone today.  What matters to the patients health and wellness today?  I want to understand my benefits    Goals Addressed             This Visit's Progress    Care Coordination Activities   On track    Care Coordination Interventions: Collaboration with RN Care Manager who indicates patient has switched her health plan to [facility] this plan year and has questions regarding transportation and OTC benefit Performed chart review to note patient has A[employer]na Value Plus [facility]) Plan Reviewed summary of benefits prior to call with patient to determine patient has access to transportation which covers 24 one way trips. Patient also has a quarterly over the counter benefit of $105 Discussed benefits with the patient to determine patient will continue utilizing [employer] for transportation as she feels it is safer due to her vision impairment and concern with the timeframe she may have to wait prior to being picked up when using health plan transportation benefit Patient reports she was under the impression her quarterly benefit is $180 that she could also use on utilities Assisted the patient in contacting her health plan; determined patient has access to both OTC benefit as well as an "extra benefits card" OTC benefit: $105 quarterly for patient to order via catalog Extra benefits card: $180 quarterly. May be used on healthy food, transportation, utilities, personal care items, pet supplies, rent or mortgage Scheduled appointment on 2/13 to assist patient with ordering items via over the counter catalog. Patient is knowledgeable of how to access extra benefits funds and reports she has already used this quarters allowance towards utility costs         SDOH  assessments and interventions completed:  No     Care Coordination Interventions:  Yes, provided   Interventions Today    Flowsheet Row Most Recent Value  Chronic Disease Discussed/Reviewed   Chronic disease discussed/reviewed during today's visit Diabetes, Hypertension (HTN), Chronic Kidney Disease/End Stage Renal Disease (ESRD)  General Interventions   General Interventions Discussed/Reviewed General Interventions Reviewed, Community Resources        Follow up plan: Follow up call scheduled for 2/13    Encounter Outcome:  Pt. Visit Completed   Daneen Schick, Arita Miss, CDP Social Worker, Certified Dementia Practitioner [place] Management  Care Coordination [phone]

## 2022-02-25 ENCOUNTER — Encounter: Payer: Self-pay | Admitting: Gastroenterology

## 2022-03-01 ENCOUNTER — Telehealth: Payer: Self-pay

## 2022-03-01 ENCOUNTER — Ambulatory Visit: Payer: Self-pay

## 2022-03-01 NOTE — Patient Outreach (Signed)
  Care Coordination   Follow Up Visit Note   03/01/2022 Name: Adrienne [name] MRN: [id number] DOB: October 14, 1955  Adrienne [name] is a 67 y.o. year old female who sees Charlott Rakes, MD for primary care. I spoke with  Adrienne [name] by phone today.  What matters to the patients health and wellness today?  I would like to order my over the counter products from my health plan catalog    Goals Addressed             This Visit's Progress    Care Coordination Activities   On track    Care Coordination Interventions: Assisted patient with online OTC order Order number: [id number] should arrive within 7-10 business days Discussed SW will follow up with patient over the next two weeks to confirm receipt of products         SDOH assessments and interventions completed:  No     Care Coordination Interventions:  Yes, provided   Interventions Today    Flowsheet Row Most Recent Value  Chronic Disease   Chronic disease during today's visit Diabetes, Hypertension (HTN), Chronic Kidney Disease/End Stage Renal Disease (ESRD)  General Interventions   General Interventions Discussed/Reviewed General Interventions Reviewed        Follow up plan:  SW will follow up over the next two weeks    Encounter Outcome:  Pt. Visit Completed   Nadara Eaton, CDP Social Worker, Certified Dementia Practitioner [facility] Care Management  Care Coordination [phone]

## 2022-03-01 NOTE — Patient Outreach (Signed)
  Care Coordination   03/01/2022 Name: Adrienne [name] MRN: [id number] DOB: 19-Dec-1955   Care Coordination Outreach Attempts:  An unsuccessful telephone outreach was attempted for a scheduled appointment today. It sounded as if someone answered the phone but did not say anything. SW attempted to engage multiple times without a response.  Follow Up Plan:  Additional outreach attempts will be made to offer the patient care coordination information and services.   Encounter Outcome:  No Answer   Care Coordination Interventions:  No, not indicated    Daneen Schick, BSW, CDP Social Worker, Certified Dementia Practitioner [place] Management  Care Coordination [phone]

## 2022-03-01 NOTE — Patient Instructions (Signed)
Visit Information  Thank you for taking time to visit with me today. Please don't hesitate to contact me if I can be of assistance to you.   Following are the goals we discussed today:   Goals Addressed             This Visit's Progress    Care Coordination Activities   On track    Care Coordination Interventions: Assisted patient with online OTC order Order number: [id number] should arrive within 7-10 business days Discussed SW will follow up with patient over the next two weeks to confirm receipt of products          If you are experiencing a Mental Health or [place] or need someone to talk to, please call 911  The patient verbalized understanding of instructions, educational materials, and care plan provided today and DECLINED offer to receive copy of patient instructions, educational materials, and care plan.   Daneen Schick, BSW, CDP Social Worker, Certified Dementia Practitioner [place] [facility]  Care Coordination [phone]

## 2022-03-03 ENCOUNTER — Other Ambulatory Visit: Payer: Medicare Other

## 2022-03-04 NOTE — Telephone Encounter (Signed)
Inbound call from patient needing prep instructions for upcoming procedure. Please advise.

## 2022-03-04 NOTE — Telephone Encounter (Signed)
Pt called returned. All prep questions answered with the pt and her friend that's a Therapist, sports. Pt is blind and unable to read her prep instructions. She understands if she needs further assistance on times etc to call the office back.

## 2022-03-05 ENCOUNTER — Other Ambulatory Visit (HOSPITAL_COMMUNITY): Payer: Self-pay

## 2022-03-07 ENCOUNTER — Other Ambulatory Visit: Payer: Self-pay

## 2022-03-08 ENCOUNTER — Encounter: Payer: Self-pay | Admitting: Certified Registered Nurse Anesthetist

## 2022-03-09 ENCOUNTER — Encounter: Payer: Self-pay | Admitting: Gastroenterology

## 2022-03-09 ENCOUNTER — Ambulatory Visit (AMBULATORY_SURGERY_CENTER): Payer: Medicare HMO | Admitting: Gastroenterology

## 2022-03-09 VITALS — BP 126/71 | HR 92 | Temp 97.1°F | Resp 15 | Ht 66.0 in | Wt 200.0 lb

## 2022-03-09 DIAGNOSIS — Z1211 Encounter for screening for malignant neoplasm of colon: Secondary | ICD-10-CM

## 2022-03-09 DIAGNOSIS — K5140 Inflammatory polyps of colon without complications: Secondary | ICD-10-CM | POA: Diagnosis not present

## 2022-03-09 DIAGNOSIS — D125 Benign neoplasm of sigmoid colon: Secondary | ICD-10-CM

## 2022-03-09 DIAGNOSIS — E119 Type 2 diabetes mellitus without complications: Secondary | ICD-10-CM | POA: Diagnosis not present

## 2022-03-09 MED ORDER — SODIUM CHLORIDE 0.9 % IV SOLN: 500.0000 mL | Freq: Once | INTRAVENOUS | Status: DC

## 2022-03-09 NOTE — Progress Notes (Signed)
Pt's states no medical or surgical changes since previsit or office visit. 

## 2022-03-09 NOTE — Progress Notes (Signed)
Report given to PACU, vss 

## 2022-03-09 NOTE — Progress Notes (Signed)
1520 Robinul 0.1 mg IV given due large amount of secretions upon assessment.  MD made aware, vss

## 2022-03-09 NOTE — Progress Notes (Signed)
Called to room to assist during endoscopic procedure.  Patient ID and intended procedure confirmed with present staff. Received instructions for my participation in the procedure from the performing physician.  

## 2022-03-09 NOTE — Op Note (Addendum)
[facility] Patient Name: Adrienne [name] Procedure Date: 03/09/2022 3:09 PM MRN: [id number] Endoscopist: Mauri Pole , MD, [id number] Age: 67 Referring MD:  Date of Birth: 1955-09-29 Gender: Female Account #: [id number] Procedure:                Colonoscopy Indications:              Screening for colorectal malignant neoplasm Medicines:                Monitored Anesthesia Care Procedure:                Pre-Anesthesia Assessment:                           - Prior to the procedure, a History and Physical                            was performed, and patient medications and                            allergies were reviewed. The patient's tolerance of                            previous anesthesia was also reviewed. The risks                            and benefits of the procedure and the sedation                            options and risks were discussed with the patient.                            All questions were answered, and informed consent                            was obtained. Prior Anticoagulants: The patient                            last took Plavix (clopidogrel) 5 days prior to the                            procedure. ASA Grade Assessment: III - A patient                            with severe systemic disease. After reviewing the                            risks and benefits, the patient was deemed in                            satisfactory condition to undergo the procedure.                           After obtaining informed consent, the colonoscope  was passed under direct vision. Throughout the                            procedure, the patient's blood pressure, pulse, and                            oxygen saturations were monitored continuously. The                            Olympus PCF-H190DL [id number]) Colonoscope was                            introduced through the anus and advanced to the the                             cecum, identified by the ileocecal valve. The                            colonoscopy was performed without difficulty. The                            patient tolerated the procedure well. The quality                            of the bowel preparation was excellent. The                            ileocecal valve, appendiceal orifice, and rectum                            were photographed. Scope In: 3:18:05 PM Scope Out: 3:39:00 PM Scope Withdrawal Time: 0 hours 14 minutes 8 seconds  Total Procedure Duration: 0 hours 20 minutes 55 seconds  Findings:                 The perianal and digital rectal examinations were                            normal.                           A 7 mm polyp was found in the sigmoid colon. The                            polyp was pedunculated. The polyp was removed with                            a hot snare. Resection and retrieval were complete.                           External and internal hemorrhoids were found during                            retroflexion. The hemorrhoids were small. Complications:  No immediate complications. Estimated Blood Loss:     Estimated blood loss was minimal. Impression:               - One 7 mm polyp in the sigmoid colon, removed with                            a hot snare. Resected and retrieved.                           - External and internal hemorrhoids. Recommendation:           - Patient has a contact number available for                            emergencies. The signs and symptoms of potential                            delayed complications were discussed with the                            patient. Return to normal activities tomorrow.                            Written discharge instructions were provided to the                            patient.                           - Resume previous diet.                           - Continue present medications.                           - Await  pathology results.                           - Repeat colonoscopy in 5-10 years for surveillance                            based on pathology results.                           - Resume Plavix (clopidogrel) at prior dose                            tomorrow. Refer to managing physician for further                            adjustment of therapy. Mauri Pole, MD 03/09/2022 3:54:30 PM This report has been signed electronically.

## 2022-03-09 NOTE — Patient Instructions (Addendum)
Thank you for coming in to see [place] today. Resume your previous diet and medications/supplements today. Return to regular daily activities tomorrow. Recommend next colonoscopy in 5-10 years.  Will make this decision once pathology results are final.  RESTART PLAVIX TOMORROW 03/10/2022.   YOU HAD AN ENDOSCOPIC PROCEDURE TODAY AT [facility]:   Refer to the procedure report that was given to you for any specific questions about what was found during the examination.  If the procedure report does not answer your questions, please call your gastroenterologist to clarify.  If you requested that your care partner not be given the details of your procedure findings, then the procedure report has been included in a sealed envelope for you to review at your convenience later.  YOU SHOULD EXPECT: Some feelings of bloating in the abdomen. Passage of more gas than usual.  Walking can help get rid of the air that was put into your GI tract during the procedure and reduce the bloating. If you had a lower endoscopy (such as a colonoscopy or flexible sigmoidoscopy) you may notice spotting of blood in your stool or on the toilet paper. If you underwent a bowel prep for your procedure, you may not have a normal bowel movement for a few days.  Please Note:  You might notice some irritation and congestion in your nose or some drainage.  This is from the oxygen used during your procedure.  There is no need for concern and it should clear up in a day or so.  SYMPTOMS TO REPORT IMMEDIATELY:  Following lower endoscopy (colonoscopy or flexible sigmoidoscopy):  Excessive amounts of blood in the stool  Significant tenderness or worsening of abdominal pains  Swelling of the abdomen that is new, acute  Fever of 100F or higher   For urgent or emergent issues, a gastroenterologist can be reached at any hour by calling [phone]. Do not use MyChart messaging for urgent concerns.    DIET:  We do  recommend a small meal at first, but then you may proceed to your regular diet.  Drink plenty of fluids but you should avoid alcoholic beverages for 24 hours.  ACTIVITY:  You should plan to take it easy for the rest of today and you should NOT DRIVE or use heavy machinery until tomorrow (because of the sedation medicines used during the test).    FOLLOW UP: Our staff will call the number listed on your records the next business day following your procedure.  We will call around 7:15- 8:00 am to check on you and address any questions or concerns that you may have regarding the information given to you following your procedure. If we do not reach you, we will leave a message.     If any biopsies were taken you will be contacted by phone or by letter within the next 1-3 weeks.  Please call us at [phone] if you have not heard about the biopsies in 3 weeks.    SIGNATURES/CONFIDENTIALITY: You and/or your care partner have signed paperwork which will be entered into your electronic medical record.  These signatures attest to the fact that that the information above on your After Visit Summary has been reviewed and is understood.  Full responsibility of the confidentiality of this discharge information lies with you and/or your care-partner.

## 2022-03-09 NOTE — Progress Notes (Signed)
[facility] History and Physical   Primary Care Physician:  Charlott Rakes, MD   Reason for Procedure:  Colorectal cancer screening  Plan:    Screening colonoscopy with possible interventions as needed     HPI: Adrienne [name] is a very pleasant 67 y.o. female here for screening colonoscopy. Denies any nausea, vomiting, abdominal pain, melena or bright red blood per rectum  The risks and benefits as well as alternatives of endoscopic procedure(s) have been discussed and reviewed. All questions answered. The patient agrees to proceed.    Past Medical History:  Diagnosis Date   Blindness of both eyes    Cataract    Diabetes ([place])    GERD (gastroesophageal reflux disease)    Glaucoma    Heart murmur    High cholesterol    Hypertension    Stroke ([place]) 2015   R eye    Past Surgical History:  Procedure Laterality Date   CATARACT EXTRACTION, BILATERAL     Laser eye surgery, cataract surgery bi-lat   CESAREAN SECTION     Twins, [place]   EYE SURGERY Bilateral 2012   LASER, CATARACT   TEE WITHOUT CARDIOVERSION N/A 12/31/2013   Procedure: TRANSESOPHAGEAL ECHOCARDIOGRAM (TEE);  Surgeon: Laverda Page, MD;  Location: [facility];  Service: Cardiovascular;  Laterality: N/A;  H&P in file   TUBAL LIGATION Bilateral     Prior to Admission medications   Medication Sig Start Date End Date Taking? Authorizing Provider  acetaminophen (TYLENOL) 500 MG tablet Take 500 mg by mouth every 6 (six) hours as needed for moderate pain.    [provider]  aspirin EC 81 MG tablet Take 81 mg by mouth daily.     [provider]  atorvastatin (LIPITOR) 80 MG tablet Take 1 tablet (80 mg total) by mouth daily. 01/26/22   Charlott Rakes, MD  cetirizine (ZYRTEC) 10 MG tablet Take 1 tablet (10 mg total) by mouth daily. 08/02/21   Charlott Rakes, MD  cholecalciferol (VITAMIN D3) 25 MCG (1000 UNIT) tablet Take 1 tablet (1,000 Units total) by mouth daily. 10/30/19    Charlott Rakes, MD  clopidogrel (PLAVIX) 75 MG tablet Take 1 tablet (75 mg total) by mouth daily. 01/26/22   Charlott Rakes, MD  Continuous Blood Gluc Receiver (FREESTYLE LIBRE 2 READER) DEVI Please use to check blood sugar three times daily. E11.69 06/03/21   Charlott Rakes, MD  Continuous Blood Gluc Sensor (FREESTYLE LIBRE 2 SENSOR) MISC Please use to check blood sugar three times daily. Change sensors once every 14 days. E11.69 06/03/21   Charlott Rakes, MD  diclofenac Sodium (VOLTAREN) 1 % GEL Apply 4 g topically 4 (four) times daily. Patient not taking: Reported on 02/17/2022 01/19/21   Charlott Rakes, MD  famotidine (PEPCID) 20 MG tablet Take 1 tablet (20 mg total) by mouth 2 (two) times daily. 01/26/22   Charlott Rakes, MD  fluticasone (FLONASE) 50 MCG/ACT nasal spray PLACE 2 SPRAYS INTO BOTH NOSTRILS DAILY. 04/20/21   Charlott Rakes, MD  guaiFENesin-dextromethorphan (ROBITUSSIN DM) 100-10 MG/5ML syrup Take 5 mLs by mouth every 6 (six) hours as needed for cough. 02/18/19   Arrien, Jimmy Picket, MD  insulin glargine (LANTUS) 100 UNIT/ML Solostar Pen Inject 35 Units into the skin in the morning AND 26 Units every evening. 01/26/22   Charlott Rakes, MD  Insulin Pen Needle 31G X 5 MM MISC use as directed 4 (four) times daily - after meals and at bedtime. 03/02/21   Charlott Rakes, MD  losartan-hydrochlorothiazide (  HYZAAR) 100-25 MG tablet TAKE 1 TABLET BY MOUTH DAILY. 01/26/22   Charlott Rakes, MD  metFORMIN (GLUCOPHAGE) 500 MG tablet Take 1 tablet (500 mg total) by mouth 2 (two) times daily with a meal. 01/26/22   Charlott Rakes, MD  metoCLOPramide (REGLAN) 5 MG tablet Take 1 tablet (5 mg total) by mouth every 8 (eight) hours as needed for nausea. 01/19/21   Charlott Rakes, MD  Misc. Devices MISC Blood pressure monitor. Dx: Hypertension 12/18/18   Charlott Rakes, MD  predniSONE (DELTASONE) 20 MG tablet Take 1 tablet (20 mg total) by mouth daily with breakfast. Patient not taking: Reported on  01/26/2022 11/22/21   Charlott Rakes, MD    Current Outpatient Medications  Medication Sig Dispense Refill   acetaminophen (TYLENOL) 500 MG tablet Take 500 mg by mouth every 6 (six) hours as needed for moderate pain.     aspirin EC 81 MG tablet Take 81 mg by mouth daily.      atorvastatin (LIPITOR) 80 MG tablet Take 1 tablet (80 mg total) by mouth daily. 90 tablet 1   cetirizine (ZYRTEC) 10 MG tablet Take 1 tablet (10 mg total) by mouth daily. 30 tablet 1   cholecalciferol (VITAMIN D3) 25 MCG (1000 UNIT) tablet Take 1 tablet (1,000 Units total) by mouth daily. 30 tablet 3   clopidogrel (PLAVIX) 75 MG tablet Take 1 tablet (75 mg total) by mouth daily. 90 tablet 1   Continuous Blood Gluc Receiver (FREESTYLE LIBRE 2 READER) DEVI Please use to check blood sugar three times daily. E11.69 1 each 0   Continuous Blood Gluc Sensor (FREESTYLE LIBRE 2 SENSOR) MISC Please use to check blood sugar three times daily. Change sensors once every 14 days. E11.69 2 each 3   diclofenac Sodium (VOLTAREN) 1 % GEL Apply 4 g topically 4 (four) times daily. (Patient not taking: Reported on 02/17/2022) 100 g 1   famotidine (PEPCID) 20 MG tablet Take 1 tablet (20 mg total) by mouth 2 (two) times daily. 180 tablet 1   fluticasone (FLONASE) 50 MCG/ACT nasal spray PLACE 2 SPRAYS INTO BOTH NOSTRILS DAILY. 16 g 2   guaiFENesin-dextromethorphan (ROBITUSSIN DM) 100-10 MG/5ML syrup Take 5 mLs by mouth every 6 (six) hours as needed for cough. 118 mL 0   insulin glargine (LANTUS) 100 UNIT/ML Solostar Pen Inject 35 Units into the skin in the morning AND 26 Units every evening. 30 mL 6   Insulin Pen Needle 31G X 5 MM MISC use as directed 4 (four) times daily - after meals and at bedtime. 100 each 3   losartan-hydrochlorothiazide (HYZAAR) 100-25 MG tablet TAKE 1 TABLET BY MOUTH DAILY. 90 tablet 1   metFORMIN (GLUCOPHAGE) 500 MG tablet Take 1 tablet (500 mg total) by mouth 2 (two) times daily with a meal. 180 tablet 1   metoCLOPramide  (REGLAN) 5 MG tablet Take 1 tablet (5 mg total) by mouth every 8 (eight) hours as needed for nausea. 90 tablet 1   Misc. Devices MISC Blood pressure monitor. Dx: Hypertension 1 each 0   predniSONE (DELTASONE) 20 MG tablet Take 1 tablet (20 mg total) by mouth daily with breakfast. (Patient not taking: Reported on 01/26/2022) 5 tablet 0   No current facility-administered medications for this visit.    Allergies as of 03/09/2022 - Review Complete 03/09/2022  Allergen Reaction Noted   Codeine Itching 10/28/2013   Hydrocodone Itching 10/28/2013   Oxycodone Itching 11/05/2013   Penicillins Itching 10/28/2013   Clindamycin/lincomycin Itching 01/03/2018   Niacin  and related Itching 09/05/2015   Penicillin g Itching 11/04/2013   Niacin Itching and Anxiety 11/04/2014    Family History  Problem Relation Age of Onset   Dementia Mother    Diabetes Father    Hypertension Father    Hypertension Sister    Diabetes Sister    Diabetes Sister    Heart disease Sister    Colon polyps Daughter    Colon cancer Neg Hx    Esophageal cancer Neg Hx    Rectal cancer Neg Hx    Stomach cancer Neg Hx     Social History   Socioeconomic History   Marital status: Divorced    Spouse name: Not on file   Number of children: 3   Years of education: 12   Highest education level: 12th grade  Occupational History    Employer: [employer]  Tobacco Use   Smoking status: Former    Passive exposure: Past   Smokeless tobacco: Never   Tobacco comments:    QUIT IN 1987  Vaping Use   Vaping Use: Never used  Substance and Sexual Activity   Alcohol use: No    Alcohol/week: 0.0 standard drinks of alcohol    Comment: QUIT IN 1987   Drug use: No    Frequency: 2.0 times per week    Comment: QUIT 1980   Sexual activity: Not Currently  Other Topics Concern   Not on file  Social History Narrative   Patient is divorced with 3 children.   Patient is right handed.   Patient has 12 th grade education.    Patient has not been drinking caffeine.   Social Determinants of Health   Financial Resource Strain: Medium Risk (08/24/2021)   Overall Financial Resource Strain (CARDIA)    Difficulty of Paying Living Expenses: Somewhat hard  Food Insecurity: No Food Insecurity (08/23/2021)   Hunger Vital Sign    Worried About Running Out of Food in the Last Year: Never true    Ran Out of Food in the Last Year: Never true  Transportation Needs: No Transportation Needs (08/23/2021)   PRAPARE - Hydrologist (Medical): No    Lack of Transportation (Non-Medical): No  Physical Activity: Inactive (03/01/2021)   Exercise Vital Sign    Days of Exercise per Week: 0 days    Minutes of Exercise per Session: 0 min  Stress: No Stress Concern Present (03/01/2021)   [place]    Feeling of Stress : Only a little  Social Connections: Moderately Integrated (03/01/2021)   Social Connection and Isolation Panel [NHANES]    Frequency of Communication with Friends and Family: More than three times a week    Frequency of Social Gatherings with Friends and Family: More than three times a week    Attends Religious Services: More than 4 times per year    Active Member of [employer] or Organizations: Yes    Attends Music therapist: More than 4 times per year    Marital Status: Divorced  Intimate Partner Violence: Not At Risk (03/01/2021)   Humiliation, Afraid, Rape, and Kick questionnaire    Fear of Current or Ex-Partner: No    Emotionally Abused: No    Physically Abused: No    Sexually Abused: No    Review of Systems:  All other review of systems negative except as mentioned in the HPI.  Physical Exam: Vital signs in last 24 hours: There were  no vitals taken for this visit. General:   Alert, NAD Lungs:  Clear .   Heart:  Regular rate and rhythm Abdomen:  Soft, nontender and nondistended. Neuro/Psych:  Alert and  cooperative. Normal mood and affect. A and O x 3  Reviewed labs, radiology imaging, old records and pertinent past GI work up  Patient is appropriate for planned procedure(s) and anesthesia in an ambulatory setting   K. Denzil Magnuson , MD [phone]

## 2022-03-10 ENCOUNTER — Telehealth: Payer: Self-pay

## 2022-03-10 NOTE — Telephone Encounter (Signed)
No answer on follow up call. 

## 2022-03-15 ENCOUNTER — Ambulatory Visit: Payer: Self-pay

## 2022-03-15 NOTE — Patient Instructions (Signed)
Visit Information  Thank you for taking time to visit with me today. Please don't hesitate to contact me if I can be of assistance to you.   Following are the goals we discussed today:   Goals Addressed             This Visit's Progress    Care Coordination Activities       Care Coordination Interventions: Confirmed receipt of over the counter products Scheduled call to assist patient with quarter two order           Our next appointment is by telephone on 4/16 at 10:30  Please call the care guide team at [phone] if you need to cancel or reschedule your appointment.   If you are experiencing a Mental Health or [place] or need someone to talk to, please call 911  The patient verbalized understanding of instructions, educational materials, and care plan provided today and DECLINED offer to receive copy of patient instructions, educational materials, and care plan.   Telephone follow up appointment with care management team member scheduled for:4/16  Daneen Schick, BSW, CDP Social Worker, Certified Dementia Practitioner [facility]  Care Coordination [phone]

## 2022-03-15 NOTE — Patient Outreach (Signed)
  Care Coordination   Follow Up Visit Note   03/15/2022 Name: Adrienne [name] MRN: [id number] DOB: 03/06/1955  Adrienne [name] is a 67 y.o. year old female who sees Charlott Rakes, MD for primary care. I spoke with  Adrienne [name] by phone today.  What matters to the patients health and wellness today?  No concerns at this time    Goals Addressed             This Visit's Progress    Care Coordination Activities       Care Coordination Interventions: Confirmed receipt of over the counter products Scheduled call to assist patient with quarter two order           SDOH assessments and interventions completed:  No     Care Coordination Interventions:  Yes, provided   Interventions Today    Flowsheet Row Most Recent Value  Chronic Disease   Chronic disease during today's visit Diabetes, Hypertension (HTN), Chronic Kidney Disease/End Stage Renal Disease (ESRD)  General Interventions   General Interventions Discussed/Reviewed General Interventions Reviewed        Follow up plan: Follow up call scheduled for 4/16    Encounter Outcome:  Pt. Visit Completed   Daneen Schick, Arita Miss, CDP Social Worker, Certified Dementia Practitioner [place] [facility]  Care Coordination [phone]

## 2022-03-24 ENCOUNTER — Other Ambulatory Visit: Payer: Self-pay

## 2022-03-24 NOTE — Progress Notes (Signed)
Patient outreached by Donney Rankins, PharmD Candidate on 03/24/2022 to discuss hypertension.    Patient has an automated home blood pressure machine.  It is an arm cuff, however, she rarely checks her BP at home.  She reports being adherent to her medications.  She denies lightheadedness, dizziness, headache, chest pain, or shortness of breath.  As far as her diet, for breakfast she will eat fruit.  For lunch, it depends - chicken sandwich from [employer], fruit, or bacon.  For dinner, she will eat fried/baked chicken or fried fish.  She does implement vegetables into her diet.  She does not add salt to her food.  She does not exercise regularly, but plans to start today.    Medication review was performed. They are taking medications as prescribed.   The following barriers to adherence were noted:  - They do not have cost concerns.  - They do not have transportation concerns.  - They do not need assistance obtaining refills.  - They do not occasionally forget to take some of their prescribed medications.  - They do not feel like one/some of their medications make them feel poorly.  - They do not have questions or concerns about their medications.  - They do have follow up scheduled with their primary care provider/cardiologist.   The following interventions were completed:  - Medications were reviewed  - Patient was educated on goal blood pressures and long term health implications of elevated blood pressure  - Patient was educated on proper technique to check home blood pressure and reminded to bring home machine and readings to next provider appointment  - Patient was counseled on lifestyle modifications to improve blood pressure, including exercise   The patient has follow up scheduled: 05/02/2022  PCP: [facility]  PharmD Candidate 2024   Maryan Puls, PharmD PGY-1 [facility] Pharmacy Resident

## 2022-03-30 ENCOUNTER — Encounter: Payer: Self-pay | Admitting: Gastroenterology

## 2022-03-31 ENCOUNTER — Other Ambulatory Visit (HOSPITAL_COMMUNITY): Payer: Self-pay

## 2022-03-31 ENCOUNTER — Ambulatory Visit: Payer: Self-pay

## 2022-03-31 ENCOUNTER — Other Ambulatory Visit: Payer: Self-pay

## 2022-03-31 NOTE — Patient Outreach (Addendum)
  Care Coordination   Follow Up Visit Note   03/31/2022 Name: Adrienne [name] MRN: [id number] DOB: April 25, 1955  Adrienne [name] is a 67 y.o. year old female who sees Charlott Rakes, MD for primary care. I spoke with  Adrienne [name] by phone today.  What matters to the patients health and wellness today?  Patient will practice checking her BP at home with the help of her daughter to become more familiar/comfortable.     Goals Addressed               This Visit's Progress     Patient Stated     I want to improve my diabetes (pt-stated)        Care Coordination Interventions: Reviewed medications with patient and discussed importance of medication adherence Counseled on importance of regular laboratory monitoring as prescribed Review of patient status, including review of consultants reports, relevant laboratory and other test results, and medications completed Discussed plans with patient for ongoing care coordination follow up and confirmed patient has direct contact number for this nurse care coodinator Lab Results  Component Value Date   HGBA1C 9.5 (A) 11/22/2021           Other     To practice taking my BP with the help of my daughter        Care Coordination Interventions: Evaluation of current treatment plan related to hypertension self management and patient's adherence to plan as established by provider Reviewed medications with patient and discussed importance of compliance Advised patient, providing education and rationale, to monitor blood pressure daily and record, calling PCP for findings outside established parameters Provided education on prescribed diet low Sodium Educated patient about target BP <130/80 Discussed with patient she plans to practice self checking her BP with her daughter to become more familiar/comfortable  Last practice recorded BP readings:  BP Readings from Last 3 Encounters:  03/09/22 126/71  01/26/22 (!) 143/82  11/22/21  (!) 167/90  Most recent eGFR/CrCl:  Lab Results  Component Value Date   EGFR 32 (L) 08/02/2021    No components found for: "CRCL"          Interventions Today    Flowsheet Row Most Recent Value  Chronic Disease   Chronic disease during today's visit Diabetes, Hypertension (HTN)  General Interventions   General Interventions Discussed/Reviewed General Interventions Discussed, General Interventions Reviewed, Labs, Doctor Visits  Doctor Visits Discussed/Reviewed Doctor Visits Reviewed, PCP, Doctor Visits Discussed  Education Interventions   Education Provided Provided Education  Provided Verbal Education On When to see the doctor, Medication  Nutrition Interventions   Nutrition Discussed/Reviewed Decreasing salt          SDOH assessments and interventions completed:  No     Care Coordination Interventions:  Yes, provided   Follow up plan: Referral made to [facility] for counseling services Follow up call scheduled for 05/08/22 @1 :00 PM    Encounter Outcome:  Pt. Visit Completed

## 2022-03-31 NOTE — Patient Instructions (Addendum)
Visit Information  Thank you for taking time to visit with me today. Please don't hesitate to contact me if I can be of assistance to you.   Following are the goals we discussed today:   Goals Addressed               This Visit's Progress     Patient Stated     I want to improve my diabetes (pt-stated)        Care Coordination Interventions: Reviewed medications with patient and discussed importance of medication adherence Counseled on importance of regular laboratory monitoring as prescribed Review of patient status, including review of consultants reports, relevant laboratory and other test results, and medications completed Discussed plans with patient for ongoing care coordination follow up and confirmed patient has direct contact number for this nurse care coodinator Lab Results  Component Value Date   HGBA1C 9.5 (A) 11/22/2021           Other     To practice taking my BP with the help of my daughter        Care Coordination Interventions: Evaluation of current treatment plan related to hypertension self management and patient's adherence to plan as established by provider Reviewed medications with patient and discussed importance of compliance Advised patient, providing education and rationale, to monitor blood pressure daily and record, calling PCP for findings outside established parameters Provided education on prescribed diet low Sodium Educated patient about target BP <130/80 Discussed with patient she plans to practice self checking her BP with her daughter to become more familiar/comfortable  Last practice recorded BP readings:  BP Readings from Last 3 Encounters:  03/09/22 126/71  01/26/22 (!) 143/82  11/22/21 (!) 167/90  Most recent eGFR/CrCl:  Lab Results  Component Value Date   EGFR 32 (L) 08/02/2021    No components found for: "CRCL"              Our next appointment is by telephone on 05/09/22 at 1:00 PM  Please call the care guide team  at [phone] if you need to cancel or reschedule your appointment.   If you are experiencing a Mental Health or [place] or need someone to talk to, please call 1-800-273-TALK (toll free, 24 hour hotline) go to [facility] [phone])  The patient verbalized understanding of instructions, educational materials, and care plan provided today and DECLINED offer to receive copy of patient instructions, educational materials, and care plan.   Barb Merino, RN, BSN, CCM Care Management Coordinator [facility] Care Management Direct Phone: [phone]

## 2022-04-01 ENCOUNTER — Ambulatory Visit: Payer: Self-pay | Admitting: Licensed Clinical Social Worker

## 2022-04-01 NOTE — Patient Outreach (Signed)
  Care Coordination   Follow Up Visit Note   04/01/2022 Name: Adrienne [name] MRN: [id number] DOB: 03/03/1955  [name] is a 67 y.o. year old female who sees Charlott Rakes, MD for primary care. I spoke with  [name] by phone today.  What matters to the patients health and wellness today?  Symptom Management    Goals Addressed             This Visit's Progress    Obtain Supportive Resources   On track    Care Coordination Interventions: Solution-Focused Strategies employed:  Active listening / Reflection utilized  Emotional Support Provided Reviewed mental health medications and discussed importance of compliance: Patient endorses compliance Verbalization of feelings encouraged   Patient participates in therapy weekly LCSW discussed Voc Rehab to assist with pt's goal to obtain employment. Care guide referral completed to assist with referral LCSW collaborated with Care Guide to schedule pt with [facility] RNCM to provide additional support with management of chronic health conditions           SDOH assessments and interventions completed:  No     Care Coordination Interventions:  Yes, provided  Interventions Today    Flowsheet Row Most Recent Value  Chronic Disease   Chronic disease during today's visit Hypertension (HTN), Diabetes, Chronic Kidney Disease/End Stage Renal Disease (ESRD)  [Blindness]  General Interventions   General Interventions Discussed/Reviewed General Interventions Reviewed, [employer] provided validation and encouragement. Informed pt of process to obtain new counselor and discussed resources]  Mental Health Interventions   Mental Health Discussed/Reviewed Mental Health Reviewed, Coping Strategies  [LCSW discussed healthy coping skills to assist with management of symptoms. Assessed pt's hx with local agencies and preferences in new counselor]       Follow up plan: Follow up call scheduled for 1 week     Encounter Outcome:  Pt. Visit Completed   Christa See, MSW, [email or web address] Phone [phone] 2:55 PM

## 2022-04-01 NOTE — Patient Instructions (Signed)
Visit Information  Thank you for taking time to visit with me today. Please don't hesitate to contact me if I can be of assistance to you.   Following are the goals we discussed today:   Goals Addressed             This Visit's Progress    Obtain Supportive Resources   On track    Activities and task to complete in order to accomplish goals.   Keep all upcoming appointments discussed today Continue with compliance of taking medication prescribed by Doctor Implement healthy coping skills discussed to assist with management of symptoms Continue working with [facility] care team to assist with goals identified Call insurance company to follow up on mental health coverage and/or associated co-pays          Our next appointment is by telephone on 3/22 at 11 AM  Please call the care guide team at [phone] if you need to cancel or reschedule your appointment.   If you are experiencing a Mental Health or [place] or need someone to talk to, please call the Suicide and Crisis Lifeline: 988 call 911   The patient verbalized understanding of instructions, educational materials, and care plan provided today and DECLINED offer to receive copy of patient instructions, educational materials, and care plan.   Christa See, MSW, [email or web address] Phone [phone] 2:56 PM

## 2022-04-08 ENCOUNTER — Ambulatory Visit: Payer: Self-pay | Admitting: Licensed Clinical Social Worker

## 2022-04-08 NOTE — Patient Outreach (Signed)
  Care Coordination   Follow Up Visit Note   04/08/2022 Name: Adrienne [name] MRN: [id number] DOB: 1955/02/28  Adrienne [name] is a 67 y.o. year old female who sees Charlott Rakes, MD for primary care. I spoke with  Adrienne [name] by phone today.  What matters to the patients health and wellness today?  Resources    Goals Addressed             This Visit's Progress    Obtain Supportive Resources   On track    Activities and task to complete in order to accomplish goals.   Keep all upcoming appointments discussed today Continue with compliance of taking medication prescribed by Doctor Implement healthy coping skills discussed to assist with management of symptoms Continue working with [facility] care team to assist with goals identified Call insurance company to follow up on mental health coverage and/or associated co-pays          SDOH assessments and interventions completed:  No     Care Coordination Interventions:  Yes, provided  Interventions Today    Flowsheet Row Most Recent Value  Chronic Disease   Chronic disease during today's visit Hypertension (HTN), Other, Diabetes, Chronic Kidney Disease/End Stage Renal Disease (ESRD)  [Hx of CVA]  General Interventions   General Interventions Discussed/Reviewed General Interventions Reviewed, [employer] has not had opportunity to speak with insurance about coverage costs. Requests LCSW to assist with [place] Discussed/Reviewed Mental Health Reviewed, Coping Strategies  [Symptoms assessed and community resources discussed]       Follow up plan: Follow up call scheduled for 2-4 weeks    Encounter Outcome:  Pt. Visit Completed   Christa See, MSW, [email or web address] Phone [phone] 4:03 PM

## 2022-04-08 NOTE — Patient Instructions (Signed)
Visit Information  Thank you for taking time to visit with me today. Please don't hesitate to contact me if I can be of assistance to you.   Following are the goals we discussed today:   Goals Addressed             This Visit's Progress    Obtain Supportive Resources   On track    Activities and task to complete in order to accomplish goals.   Keep all upcoming appointments discussed today Continue with compliance of taking medication prescribed by Doctor Implement healthy coping skills discussed to assist with management of symptoms Continue working with [facility] care team to assist with goals identified Call insurance company to follow up on mental health coverage and/or associated co-pays          Our next appointment is by telephone on 4/12 at 11 AM  Please call the care guide team at [phone] if you need to cancel or reschedule your appointment.   If you are experiencing a Mental Health or [place] or need someone to talk to, please call the Suicide and Crisis Lifeline: 988 call 911   The patient verbalized understanding of instructions, educational materials, and care plan provided today and DECLINED offer to receive copy of patient instructions, educational materials, and care plan.   Christa See, MSW, [email or web address] Phone [phone] 4:05 PM

## 2022-04-29 ENCOUNTER — Ambulatory Visit: Payer: Self-pay | Admitting: Licensed Clinical Social Worker

## 2022-05-02 ENCOUNTER — Ambulatory Visit: Payer: Medicare HMO | Attending: Family Medicine | Admitting: Family Medicine

## 2022-05-02 ENCOUNTER — Other Ambulatory Visit (HOSPITAL_COMMUNITY): Payer: Self-pay

## 2022-05-02 ENCOUNTER — Encounter: Payer: Self-pay | Admitting: Family Medicine

## 2022-05-02 ENCOUNTER — Other Ambulatory Visit: Payer: Self-pay

## 2022-05-02 VITALS — BP 160/78 | HR 70 | Wt 205.3 lb

## 2022-05-02 DIAGNOSIS — Z794 Long term (current) use of insulin: Secondary | ICD-10-CM

## 2022-05-02 DIAGNOSIS — E1122 Type 2 diabetes mellitus with diabetic chronic kidney disease: Secondary | ICD-10-CM

## 2022-05-02 DIAGNOSIS — E1169 Type 2 diabetes mellitus with other specified complication: Secondary | ICD-10-CM | POA: Diagnosis not present

## 2022-05-02 DIAGNOSIS — Z1231 Encounter for screening mammogram for malignant neoplasm of breast: Secondary | ICD-10-CM

## 2022-05-02 DIAGNOSIS — G4709 Other insomnia: Secondary | ICD-10-CM

## 2022-05-02 DIAGNOSIS — I129 Hypertensive chronic kidney disease with stage 1 through stage 4 chronic kidney disease, or unspecified chronic kidney disease: Secondary | ICD-10-CM

## 2022-05-02 DIAGNOSIS — E785 Hyperlipidemia, unspecified: Secondary | ICD-10-CM | POA: Diagnosis not present

## 2022-05-02 LAB — POCT GLYCOSYLATED HEMOGLOBIN (HGB A1C): HbA1c, POC (controlled diabetic range): 10.8 % — AB (ref 0.0–7.0)

## 2022-05-02 LAB — GLUCOSE, POCT (MANUAL RESULT ENTRY): POC Glucose: 217 mg/dl — AB (ref 70–99)

## 2022-05-02 MED ORDER — TIRZEPATIDE 2.5 MG/0.5ML ~~LOC~~ SOAJ
2.5000 mg | SUBCUTANEOUS | 0 refills | Status: DC
  Filled 2022-05-02 (×2): qty 2, 28d supply, fill #0

## 2022-05-02 MED ORDER — CARVEDILOL 3.125 MG PO TABS
3.1250 mg | ORAL_TABLET | Freq: Two times a day (BID) | ORAL | 1 refills | Status: DC
  Filled 2022-05-02 (×2): qty 180, 90d supply, fill #0
  Filled 2022-07-15: qty 180, 90d supply, fill #1

## 2022-05-02 MED ORDER — TIRZEPATIDE 5 MG/0.5ML ~~LOC~~ SOAJ
5.0000 mg | SUBCUTANEOUS | 2 refills | Status: DC
  Filled 2022-05-02: qty 6, 84d supply, fill #0
  Filled 2022-05-24 – 2022-05-26 (×3): qty 2, 28d supply, fill #0
  Filled 2022-06-16 (×2): qty 6, 84d supply, fill #0

## 2022-05-02 NOTE — Progress Notes (Signed)
Subjective:  Patient ID: Adrienne [name], female    DOB: 1955/05/02  Age: 67 y.o. MRN: [id number]  CC: trouble sleeping (Patient states she has her night and days mixed up )   HPI Adrienne [name] is a 67 y.o. 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   Interval History:  A1c is 10.8 up from 9.5 previously and she endorses snacking a lot. Trulicity caused her to have constipation in the past and she remains on insulin. She discovered her insulin was going in when she administered it as she would notice it ran down her skin or she would smell it.  Ever since daylight savings began in spring, she has been sleeping during the day and sleeps a lot but stays awake at night. Goes to sleep at 8pm then wakes up at 11pm.  She falls asleep easily during the day.  She does not go out much and does not exercise. She did have something fall in her left eye causing pain and was told she had a black eye in her left eye.  She had applied ice at the time. Past Medical History:  Diagnosis Date   Blindness of both eyes    Cataract    Diabetes    GERD (gastroesophageal reflux disease)    Glaucoma    Heart murmur    High cholesterol    Hypertension    Stroke 2015   R eye    Past Surgical History:  Procedure Laterality Date   CATARACT EXTRACTION, BILATERAL     Laser eye surgery, cataract surgery bi-lat   CESAREAN SECTION     Twins, Breech   EYE SURGERY Bilateral 2012   LASER, CATARACT   TEE WITHOUT CARDIOVERSION N/A 12/31/2013   Procedure: TRANSESOPHAGEAL ECHOCARDIOGRAM (TEE);  Surgeon: Pamella Pert, MD;  Location: [facility] ENDOSCOPY;  Service: Cardiovascular;   Laterality: N/A;  H&P in file   TUBAL LIGATION Bilateral     Family History  Problem Relation Age of Onset   Dementia Mother    Diabetes Father    Hypertension Father    Hypertension Sister    Diabetes Sister    Diabetes Sister    Heart disease Sister    Colon polyps Daughter    Colon cancer Neg Hx    Esophageal cancer Neg Hx    Rectal cancer Neg Hx    Stomach cancer Neg Hx     Social History   Socioeconomic History   Marital status: Divorced    Spouse name: Not on file   Number of children: 3   Years of education: 12   Highest education level: 12th grade  Occupational History    Employer: [employer]  Tobacco Use   Smoking status: Former    Passive exposure: Past   Smokeless tobacco: Never   Tobacco comments:    QUIT IN 1987  Vaping Use   Vaping Use: Never used  Substance and Sexual Activity   Alcohol use: No    Alcohol/week: 0.0 standard drinks of alcohol    Comment: QUIT IN 1987   Drug use: No    Frequency: 2.0 times per week    Comment: QUIT 1980   Sexual activity: Not  Currently  Other Topics Concern   Not on file  Social History Narrative   Patient is divorced with 3 children.   Patient is right handed.   Patient has 12 th grade education.   Patient has not been drinking caffeine.   Social Determinants of Health   Financial Resource Strain: Medium Risk (08/24/2021)   Overall Financial Resource Strain (CARDIA)    Difficulty of Paying Living Expenses: Somewhat hard  Food Insecurity: No Food Insecurity (08/23/2021)   Hunger Vital Sign    Worried About Running Out of Food in the Last Year: Never true    Ran Out of Food in the Last Year: Never true  Transportation Needs: No Transportation Needs (08/23/2021)   PRAPARE - Administrator, Civil Service (Medical): No    Lack of Transportation (Non-Medical): No  Physical Activity: Inactive (03/01/2021)   Exercise Vital Sign    Days of Exercise per Week: 0 days    Minutes of Exercise per  Session: 0 min  Stress: No Stress Concern Present (03/01/2021)   [employer] of Occupational Health - Occupational Stress Questionnaire    Feeling of Stress : Only a little  Social Connections: Moderately Integrated (03/01/2021)   Social Connection and Isolation Panel [NHANES]    Frequency of Communication with Friends and Family: More than three times a week    Frequency of Social Gatherings with Friends and Family: More than three times a week    Attends Religious Services: More than 4 times per year    Active Member of [employer] or Organizations: Yes    Attends Engineer, structural: More than 4 times per year    Marital Status: Divorced    Allergies  Allergen Reactions   Codeine Itching   Hydrocodone Itching   Oxycodone Itching   Penicillins Itching   Clindamycin/Lincomycin Itching   Niacin And Related Itching   Penicillin G Itching   Niacin Itching and Anxiety    Facial flushing    Outpatient Medications Prior to Visit  Medication Sig Dispense Refill   acetaminophen (TYLENOL) 500 MG tablet Take 500 mg by mouth every 6 (six) hours as needed for moderate pain.     aspirin EC 81 MG tablet Take 81 mg by mouth daily.      atorvastatin (LIPITOR) 80 MG tablet Take 1 tablet (80 mg total) by mouth daily. 90 tablet 1   cetirizine (ZYRTEC) 10 MG tablet Take 1 tablet (10 mg total) by mouth daily. 30 tablet 1   cholecalciferol (VITAMIN D3) 25 MCG (1000 UNIT) tablet Take 1 tablet (1,000 Units total) by mouth daily. 30 tablet 3   clopidogrel (PLAVIX) 75 MG tablet Take 1 tablet (75 mg total) by mouth daily. 90 tablet 1   Continuous Blood Gluc Receiver (FREESTYLE LIBRE 2 READER) DEVI Please use to check blood sugar three times daily. E11.69 1 each 0   Continuous Blood Gluc Sensor (FREESTYLE LIBRE 2 SENSOR) MISC Please use to check blood sugar three times daily. Change sensors once every 14 days. E11.69 2 each 3   diclofenac Sodium (VOLTAREN) 1 % GEL Apply 4 g topically 4 (four) times  daily. 100 g 1   famotidine (PEPCID) 20 MG tablet Take 1 tablet (20 mg total) by mouth 2 (two) times daily. 180 tablet 1   fluticasone (FLONASE) 50 MCG/ACT nasal spray PLACE 2 SPRAYS INTO BOTH NOSTRILS DAILY. 16 g 2   guaiFENesin-dextromethorphan (ROBITUSSIN DM) 100-10 MG/5ML syrup Take 5 mLs by mouth every 6 (  six) hours as needed for cough. 118 mL 0   insulin glargine (LANTUS) 100 UNIT/ML Solostar Pen Inject 35 Units into the skin in the morning AND 26 Units every evening. 30 mL 6   Insulin Pen Needle 31G X 5 MM MISC use as directed 4 (four) times daily - after meals and at bedtime. 100 each 3   losartan-hydrochlorothiazide (HYZAAR) 100-25 MG tablet Take 1 tablet by mouth daily. 90 tablet 1   metFORMIN (GLUCOPHAGE) 500 MG tablet Take 1 tablet (500 mg total) by mouth 2 (two) times daily with a meal. 180 tablet 1   metoCLOPramide (REGLAN) 5 MG tablet Take 1 tablet (5 mg total) by mouth every 8 (eight) hours as needed for nausea. 90 tablet 1   Misc. Devices MISC Blood pressure monitor. Dx: Hypertension 1 each 0   predniSONE (DELTASONE) 20 MG tablet Take 1 tablet (20 mg total) by mouth daily with breakfast. 5 tablet 0   No facility-administered medications prior to visit.     ROS Review of Systems  Constitutional:  Negative for activity change and appetite change.  HENT:  Negative for sinus pressure and sore throat.   Eyes:  Positive for visual disturbance.  Respiratory:  Negative for chest tightness, shortness of breath and wheezing.   Cardiovascular:  Negative for chest pain and palpitations.  Gastrointestinal:  Negative for abdominal distention, abdominal pain and constipation.  Genitourinary: Negative.   Musculoskeletal: Negative.   Psychiatric/Behavioral:  Negative for behavioral problems and dysphoric mood.     Objective:  BP (!) 160/78   Pulse 70   Wt 205 lb 4.8 oz (93.1 kg)   SpO2 100%   BMI 33.14 kg/m      05/02/2022    3:10 PM 05/02/2022    2:27 PM 05/02/2022    2:09 PM   BP/Weight  Systolic BP 160 145 165  Diastolic BP 78 81 85  Wt. (Lbs)   205.3  BMI   33.14 kg/m2      Physical Exam Constitutional:      Appearance: She is well-developed.  Cardiovascular:     Rate and Rhythm: Normal rate.     Heart sounds: Normal heart sounds. No murmur heard. Pulmonary:     Effort: Pulmonary effort is normal.     Breath sounds: Normal breath sounds. No wheezing or rales.  Chest:     Chest wall: No tenderness.  Abdominal:     General: Bowel sounds are normal. There is no distension.     Palpations: Abdomen is soft. There is no mass.     Tenderness: There is no abdominal tenderness.  Musculoskeletal:        General: Normal range of motion.     Right lower leg: No edema.     Left lower leg: No edema.  Skin:    Comments: Hyperpigmentation inferior to the left eye.  Left sclera with whitish coloration at 1 o'clock Conjunctiva is normal bilaterally  Neurological:     Mental Status: She is alert and oriented to person, place, and time.  Psychiatric:        Mood and Affect: Mood normal.        Latest Ref Rng & Units 08/02/2021    4:00 PM 04/20/2021    4:07 PM 01/19/2021    4:01 PM  CMP  Glucose 70 - 99 mg/dL 578  469  629   BUN 8 - 27 mg/dL 25  35  19   Creatinine 0.57 - 1.00 mg/dL 5.28  1.79  1.66   Sodium 134 - 144 mmol/L 138  139  137   Potassium 3.5 - 5.2 mmol/L 4.6  4.2  4.5   Chloride 96 - 106 mmol/L 100  100  99   CO2 20 - 29 mmol/L 22  22  21    Calcium 8.7 - 10.3 mg/dL 9.4  38.1  9.8   Total Protein 6.0 - 8.5 g/dL 6.9  6.8  7.2   Total Bilirubin 0.0 - 1.2 mg/dL <8.2  0.3  0.3   Alkaline Phos 44 - 121 IU/L 105  101  143   AST 0 - 40 IU/L 15  16  15    ALT 0 - 32 IU/L 19  17  14      Lipid Panel     Component Value Date/Time   CHOL 202 (H) 05/07/2019 1049   TRIG 122 05/07/2019 1049   HDL 46 05/07/2019 1049   CHOLHDL 4.4 05/07/2019 1049   CHOLHDL 2.7 01/04/2016 1138   VLDL 12 01/04/2016 1138   LDLCALC 134 (H) 05/07/2019 1049    CBC     Component Value Date/Time   WBC 9.7 10/19/2020 1649   WBC 9.6 02/13/2019 2030   RBC 3.71 (L) 10/19/2020 1649   RBC 3.14 (L) 02/13/2019 2030   HGB 11.5 10/19/2020 1649   HCT 33.4 (L) 10/19/2020 1649   PLT 508 (H) 10/19/2020 1649   MCV 90 10/19/2020 1649   MCH 31.0 10/19/2020 1649   MCH 30.6 02/13/2019 2030   MCHC 34.4 10/19/2020 1649   MCHC 32.9 02/13/2019 2030   RDW 11.8 10/19/2020 1649   LYMPHSABS 2.7 10/19/2020 1649   MONOABS 0.4 02/13/2019 2030   EOSABS 0.3 10/19/2020 1649   BASOSABS 0.1 10/19/2020 1649    Lab Results  Component Value Date   HGBA1C 10.8 (A) 05/02/2022    Assessment & Plan:  1. Type 2 diabetes mellitus with other specified complication, with long-term current use of insulin Uncontrolled with A1c of 10.8, goal is less than 7.0 Snacking is responsible for her hyperglycemia in addition to improper administration of insulin CPP called in for education on administration of insulin Will initiate Mounjaro Counseled on Diabetic diet, my plate method, 993 minutes of moderate intensity exercise/week Blood sugar logs with fasting goals of 80-120 mg/dl, random of less than 716 and in the event of sugars less than 60 mg/dl or greater than 967 mg/dl encouraged to notify the clinic. Advised on the need for annual eye exams, annual foot exams, Pneumonia vaccine. - POCT glucose (manual entry) - POCT glycosylated hemoglobin (Hb A1C) - tirzepatide (MOUNJARO) 5 MG/0.5ML Pen; Inject 5 mg into the skin once a week.  Dispense: 6 mL; Refill: 2 - tirzepatide (MOUNJARO) 2.5 MG/0.5ML Pen; Inject 2.5 mg into the skin once a week. Then increase to 5 mg once a week  Dispense: 2 mL; Refill: 0 - CMP14+EGFR  2. Other insomnia We have discussed sleep hygiene, avoiding daytime naps and trying to exercise in the late afternoon/early evening She declines initiation a sedative and will work on sleep hygiene  3. Encounter for screening mammogram for malignant neoplasm of breast - MM  DIGITAL SCREENING BILATERAL; Future  4. Hyperlipidemia associated with type 2 diabetes mellitus Unable to assess control last lipid panel not done due to patient not fasting during multiple visits Continue statin Low-cholesterol diet  5. 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 Discussed medication compliance, adverse effects. - carvedilol (COREG) 3.125 MG tablet; Take 1 tablet (3.125 mg total) by mouth 2 (two) times daily with a meal.  Dispense: 180 tablet; Refill: 1   Meds ordered this encounter  Medications   tirzepatide (MOUNJARO) 5 MG/0.5ML Pen    Sig: Inject 5 mg into the skin once a week.    Dispense:  6 mL    Refill:  2   tirzepatide (MOUNJARO) 2.5 MG/0.5ML Pen    Sig: Inject 2.5 mg into the skin once a week. Then increase to 5 mg once a week    Dispense:  2 mL    Refill:  0   carvedilol (COREG) 3.125 MG tablet    Sig: Take 1 tablet (3.125 mg total) by mouth 2 (two) times daily with a meal.    Dispense:  180 tablet    Refill:  1    Follow-up: Return in about 3 months (around 08/01/2022) for Chronic medical conditions.       Hoy Register, MD, FAAFP. [facility] [place], [place] [phone]   05/02/2022, 6:07 PM

## 2022-05-02 NOTE — Patient Instructions (Signed)
Visit Information  Thank you for taking time to visit with me today. Please don't hesitate to contact me if I can be of assistance to you.   Following are the goals we discussed today:   Goals Addressed             This Visit's Progress    COMPLETED: Obtain Supportive Resources   On track    Activities and task to complete in order to accomplish goals.   Keep all upcoming appointments discussed today Continue with compliance of taking medication prescribed by Doctor Implement healthy coping skills discussed to assist with management of symptoms Continue working with [facility] care team to assist with goals identified Call insurance company to follow up on mental health coverage and/or associated co-pays Review supportive resources provided via email         Please call the care guide team at [phone] if you need to cancel or reschedule your appointment.   If you are experiencing a Mental Health or Behavioral Health Crisis or need someone to talk to, please call the Suicide and Crisis Lifeline: 988 call 911   The patient verbalized understanding of instructions, educational materials, and care plan provided today and DECLINED offer to receive copy of patient instructions, educational materials, and care plan.   No further follow up required:    Jenel Lucks, MSW, LCSW [facility] Care Management [facility]  [facility] [email or web address] Phone [phone] 4:47 PM

## 2022-05-02 NOTE — Patient Instructions (Signed)
Tirzepatide Injection What is this medication? TIRZEPATIDE (tir ZEP a tide) treats type 2 diabetes. It works by increasing insulin levels in your body, which decreases your blood sugar (glucose). Changes to diet and exercise are often combined with this medication. This medicine may be used for other purposes; ask your health care provider or pharmacist if you have questions. COMMON BRAND NAME(S): MOUNJARO What should I tell my care team before I take this medication? They need to know if you have any of these conditions: Endocrine tumors (MEN 2) or if someone in your family had these tumors Eye disease, vision problems Gallbladder disease History of pancreatitis Kidney disease Stomach or intestine problems Thyroid cancer or if someone in your family had thyroid cancer An unusual or allergic reaction to tirzepatide, other medications, foods, dyes, or preservatives Pregnant or trying to get pregnant Breast-feeding How should I use this medication? This medication is injected under the skin. You will be taught how to prepare and give it. It is given once every week (every 7 days). Keep taking it unless your health care provider tells you to stop. If you use this medication with insulin, you should inject this medication and the insulin separately. Do not mix them together. Do not give the injections right next to each other. Change (rotate) injection sites with each injection. This medication comes with INSTRUCTIONS FOR USE. Ask your pharmacist for directions on how to use this medication. Read the information carefully. Talk to your pharmacist or care team if you have questions. It is important that you put your used needles and syringes in a special sharps container. Do not put them in a trash can. If you do not have a sharps container, call your pharmacist or care team to get one. A special MedGuide will be given to you by the pharmacist with each prescription and refill. Be sure to read this  information carefully each time. Talk to your care team about the use of this medication in children. Special care may be needed. Overdosage: If you think you have taken too much of this medicine contact a poison control center or emergency room at once. NOTE: This medicine is only for you. Do not share this medicine with others. What if I miss a dose? If you miss a dose, take it as soon as you can unless it is more than 4 days (96 hours) late. If it is more than 4 days late, skip the missed dose. Take the next dose at the normal time. Do not take 2 doses within 3 days of each other. What may interact with this medication? Alcohol Antiviral medications for HIV or AIDS Aspirin and aspirin-like medications Beta blockers, such as atenolol, metoprolol, propranolol Certain medications for blood pressure, heart disease, irregular heart beat Chromium Clonidine Diuretics Estrogen or progestin hormones Fenofibrate Gemfibrozil Guanethidine Isoniazid Lanreotide Female hormones or anabolic steroids MAOIs, such as Marplan, Nardil, and Parnate Medications for weight loss Medications for allergies, asthma, cold, or cough Medications for depression, anxiety, or mental health conditions Niacin Nicotine NSAIDs, medications for pain and inflammation, such as ibuprofen or naproxen Octreotide Other medications for diabetes, such as glyburide, glipizide, or glimepiride Pasireotide Pentamidine Phenytoin Probenecid Quinolone antibiotics, such as ciprofloxacin, levofloxacin, ofloxacin Reserpine Some herbal dietary supplements Steroid medications, such as prednisone or cortisone Sulfamethoxazole; trimethoprim Thyroid hormones Warfarin This list may not describe all possible interactions. Give your health care provider a list of all the medicines, herbs, non-prescription drugs, or dietary supplements you use. Also tell them   if you smoke, drink alcohol, or use illegal drugs. Some items may interact with  your medicine. What should I watch for while using this medication? Visit your care team for regular checks on your progress. Drink plenty of fluids while taking this medication. Check with your care team if you get an attack of severe diarrhea, nausea, and vomiting. The loss of too much body fluid can make it dangerous for you to take this medication. A test called the HbA1C (A1C) will be monitored. This is a simple blood test. It measures your blood sugar control over the last 2 to 3 months. You will receive this test every 3 to 6 months. Learn how to check your blood sugar. Learn the symptoms of low and high blood sugar and how to manage them. Always carry a quick-source of sugar with you in case you have symptoms of low blood sugar. Examples include hard sugar candy or glucose tablets. Make sure others know that you can choke if you eat or drink when you develop serious symptoms of low blood sugar, such as seizures or unconsciousness. They must get medical help at once. Tell your care team if you have high blood sugar. You might need to change the dose of your medication. If you are sick or exercising more than usual, you might need to change the dose of your medication. Do not skip meals. Ask your care team if you should avoid alcohol. Many nonprescription cough and cold products contain sugar or alcohol. These can affect blood sugar. Pens should never be shared. Even if the needle is changed, sharing may result in passing of viruses like hepatitis or HIV. Wear a medical ID bracelet or chain, and carry a card that describes your disease and details of your medication and dosage times. Birth control may not work properly while you are taking this medication. If you take birth control pills by mouth, your care team may recommend another type of birth control for 4 weeks after you start this medication and for 4 weeks after each increase in your dose of this medication. Ask your care team which birth  control methods you should use. What side effects may I notice from receiving this medication? Side effects that you should report to your care team as soon as possible: Allergic reactions or angioedema--skin rash, itching or hives, swelling of the face, eyes, lips, tongue, arms, or legs, trouble swallowing or breathing Change in vision Dehydration--increased thirst, dry mouth, feeling faint or lightheaded, headache, dark yellow or brown urine Gallbladder problems--severe stomach pain, nausea, vomiting, fever Kidney injury--decrease in the amount of urine, swelling of the ankles, hands, or feet Pancreatitis--severe stomach pain that spreads to your back or gets worse after eating or when touched, fever, nausea, vomiting Thyroid cancer--new mass or lump in the neck, pain or trouble swallowing, trouble breathing, hoarseness Side effects that usually do not require medical attention (report these to your care team if they continue or are bothersome): Constipation Diarrhea Loss of appetite Nausea Stomach pain Upset stomach Vomiting This list may not describe all possible side effects. Call your doctor for medical advice about side effects. You may report side effects to FDA at 1-800-FDA-1088. Where should I keep my medication? Keep out of the reach of children and pets. Refrigeration (preferred): Store in the refrigerator. Keep this medication in the original carton until you are ready to take it. Do not freeze. Protect from light. Get rid of opened vials after use, even if there is medication   left. Get rid of any unopened vials or pens after the expiration date. Room Temperature: This medication may be stored at room temperature below 30 degrees C (86 degrees F) for up to 21 days. Keep this medication in the original carton until you are ready to take it. Protect from light. Avoid exposure to extreme heat. Get rid of opened vials after use, even if there is medication left. Get rid of any unopened  vials or pens after 21 days, or after they expire, whichever is first. To get rid of medications that are no longer needed or have expired: Take the medication to a medication take-back program. Check with your pharmacy or law enforcement to find a location. If you cannot return the medication, ask your pharmacist or care team how to get rid of this medication safely. NOTE: This sheet is a summary. It may not cover all possible information. If you have questions about this medicine, talk to your doctor, pharmacist, or health care provider.  2023 Elsevier/Gold Standard (2021-11-18 00:00:00)  

## 2022-05-02 NOTE — Patient Outreach (Signed)
  Care Coordination   Follow Up Visit Note   04/29/2022 Name: Adrienne [name] MRN: [id number] DOB: [birth year]  Adrienne [name] is a 67 y.o. year old female who sees Hoy Register, MD for primary care. I spoke with  Adrienne [name] by phone today.  What matters to the patients health and wellness today?  Counseling Resources    Goals Addressed             This Visit's Progress    COMPLETED: Obtain Supportive Resources   On track    Activities and task to complete in order to accomplish goals.   Keep all upcoming appointments discussed today Continue with compliance of taking medication prescribed by Doctor Implement healthy coping skills discussed to assist with management of symptoms Continue working with [facility] care team to assist with goals identified Call insurance company to follow up on mental health coverage and/or associated co-pays Review supportive resources provided via email          SDOH assessments and interventions completed:  No     Care Coordination Interventions:  Yes, provided  Interventions Today    Flowsheet Row Most Recent Value  Chronic Disease   Chronic disease during today's visit Hypertension (HTN), Diabetes, Chronic Kidney Disease/End Stage Renal Disease (ESRD)  General Interventions   General Interventions Discussed/Reviewed [facility], General Interventions Reviewed  Mental Health Interventions   Mental Health Discussed/Reviewed Mental Health Reviewed, Coping Strategies, Anxiety, Depression  [Pt requested no cost counseling respources. LCSW provided supportive resources for sliding scale fee]       Follow up plan: No further intervention required.   Encounter Outcome:  Pt. Visit Completed   Jenel Lucks, MSW, LCSW [facility] Care Management [facility]  [facility] [email or web address] Phone [phone] 4:47 PM

## 2022-05-03 ENCOUNTER — Ambulatory Visit: Payer: Self-pay

## 2022-05-03 LAB — CMP14+EGFR
ALT: 20 IU/L (ref 0–32)
AST: 14 IU/L (ref 0–40)
Albumin/Globulin Ratio: 1.8 (ref 1.2–2.2)
Albumin: 4.4 g/dL (ref 3.9–4.9)
Alkaline Phosphatase: 120 IU/L (ref 44–121)
BUN/Creatinine Ratio: 22 (ref 12–28)
BUN: 39 mg/dL — ABNORMAL HIGH (ref 8–27)
Bilirubin Total: 0.3 mg/dL (ref 0.0–1.2)
CO2: 20 mmol/L (ref 20–29)
Calcium: 10 mg/dL (ref 8.7–10.3)
Chloride: 97 mmol/L (ref 96–106)
Creatinine, Ser: 1.78 mg/dL — ABNORMAL HIGH (ref 0.57–1.00)
Globulin, Total: 2.5 g/dL (ref 1.5–4.5)
Glucose: 222 mg/dL — ABNORMAL HIGH (ref 70–99)
Potassium: 4.6 mmol/L (ref 3.5–5.2)
Sodium: 136 mmol/L (ref 134–144)
Total Protein: 6.9 g/dL (ref 6.0–8.5)
eGFR: 31 mL/min/{1.73_m2} — ABNORMAL LOW (ref >59)

## 2022-05-03 NOTE — Patient Outreach (Signed)
  Care Coordination   Follow Up Visit Note   05/03/2022 Name: Adrienne [name] MRN: [id number] DOB: 05/18/1955  Adrienne [name] is a 67 y.o. year old female who sees Adrienne Register, MD for primary care. I spoke with  Adrienne [name] by phone today.  What matters to the patients health and wellness today?  Patient is resting after receiving a shingles vaccine    Goals Addressed             This Visit's Progress    Care Coordination Activities       Care Coordination Interventions: Assisted patient with ordering over the counter supplies Scheduled call to assist patient with another order in May           SDOH assessments and interventions completed:  No     Care Coordination Interventions:  Yes, provided   Interventions Today    Flowsheet Row Most Recent Value  Chronic Disease   Chronic disease during today's visit Hypertension (HTN), Diabetes  General Interventions   General Interventions Discussed/Reviewed General Interventions Reviewed, Community Resources        Follow up plan: Follow up call scheduled for 5/14    Encounter Outcome:  Pt. Visit Completed   Bevelyn Ngo, Kenard Gower, CDP Social Worker, Certified Dementia Practitioner [facility] Care Management  Care Coordination [phone]

## 2022-05-03 NOTE — Patient Instructions (Signed)
Visit Information  Thank you for taking time to visit with me today. Please don't hesitate to contact me if I can be of assistance to you.   Following are the goals we discussed today:   Goals Addressed             This Visit's Progress    Care Coordination Activities       Care Coordination Interventions: Assisted patient with ordering over the counter supplies Scheduled call to assist patient with another order in May           Our next appointment is by telephone on 5/14 at 11:00  Please call the care guide team at [phone] if you need to cancel or reschedule your appointment.   If you are experiencing a Mental Health or Behavioral Health Crisis or need someone to talk to, please call 911  The patient verbalized understanding of instructions, educational materials, and care plan provided today and DECLINED offer to receive copy of patient instructions, educational materials, and care plan.   Bevelyn Ngo, BSW, CDP Social Worker, Certified Dementia Practitioner [facility] Care Management  Care Coordination [phone]

## 2022-05-04 ENCOUNTER — Other Ambulatory Visit: Payer: Self-pay

## 2022-05-05 ENCOUNTER — Telehealth: Payer: Self-pay | Admitting: Family Medicine

## 2022-05-05 NOTE — Telephone Encounter (Signed)
Contacted Adrienne [name] to schedule their annual wellness visit. Appointment made for 05/12/22.  Adrienne [name] AWV direct phone # [phone]

## 2022-05-09 ENCOUNTER — Ambulatory Visit: Payer: Self-pay

## 2022-05-09 NOTE — Patient Instructions (Signed)
Visit Information  Thank you for taking time to visit with me today. Please don't hesitate to contact me if I can be of assistance to you.   Following are the goals we discussed today:   Goals Addressed               This Visit's Progress     Patient Stated     I want to improve my diabetes (pt-stated)        Care Coordination Interventions: Provided education to patient about basic DM disease process Review of patient status, including review of consultants reports, relevant laboratory and other test results, and medications completed Determined patient's A1c is elevated, PCP recommended starting Monjaro 2.5 mg weekly Educated patient regarding indication, dosage and frequency of this medication, educated about most common SE and when to notify PCP and or Pharm D if symptoms occur Provided patient with verbal education related to hypoglycemia and importance of correct treatment Lab Results  Component Value Date   HGBA1C 10.8 (A) 05/02/2022             Other     To improve kidney function        Care Coordination Interventions: Assessed the Patient understanding of chronic kidney disease    Evaluation of current treatment plan related to chronic kidney disease self management and patient's adherence to plan as established by provider      Reviewed prescribed water intake, aim for 48-64 oz of water daily unless otherwise directed  Discussed complications of poorly controlled blood pressure and or uncontrolled diabetes, such as kidney impairment   Provided education on kidney disease progression   Last practice recorded BP readings:  BP Readings from Last 3 Encounters:  05/02/22 (!) 160/78  03/09/22 126/71  01/26/22 (!) 143/82  Most recent eGFR/CrCl:  Lab Results  Component Value Date   EGFR 31 (L) 05/02/2022    No components found for: "CRCL"             Our next appointment is by telephone on 06/06/22 at 1:00 PM  Please call the care guide team at  [phone] if you need to cancel or reschedule your appointment.   If you are experiencing a Mental Health or Behavioral Health Crisis or need someone to talk to, please call 1-800-273-TALK (toll free, 24 hour hotline) go to [facility] [address] [place] [phone])  The patient verbalized understanding of instructions, educational materials, and care plan provided today and DECLINED offer to receive copy of patient instructions, educational materials, and care plan.   Delsa Sale, RN, BSN, CCM Care Management Coordinator [facility] Care Management Direct Phone: [phone]

## 2022-05-09 NOTE — Patient Outreach (Signed)
  Care Coordination   05/09/2022 Name: IVONE LICHT MRN: [id number] DOB: 1955-10-24   Care Coordination Outreach Attempts:  An unsuccessful telephone outreach was attempted for a scheduled appointment today.  Follow Up Plan:  Additional outreach attempts will be made to offer the patient care coordination information and services.   Encounter Outcome:  No Answer   Care Coordination Interventions:  No, not indicated    Delsa Sale, RN, BSN, CCM Care Management Coordinator [facility] Care Management  Direct Phone: [phone]

## 2022-05-09 NOTE — Patient Outreach (Signed)
  Care Coordination   Follow Up Visit Note   05/09/2022 Name: KERRA GUILFOIL MRN: [id number] DOB: 09-Feb-1955  [name] is a 67 y.o. year old female who sees Hoy Register, MD for primary care. I spoke with  [name] by phone today.  What matters to the patients health and wellness today?  Patient would like to work on lowering her A1c and improving her kidney function.     Goals Addressed               This Visit's Progress     Patient Stated     I want to improve my diabetes (pt-stated)        Care Coordination Interventions: Provided education to patient about basic DM disease process Review of patient status, including review of consultants reports, relevant laboratory and other test results, and medications completed Determined patient's A1c is elevated, PCP recommended starting Monjaro 2.5 mg weekly Educated patient regarding indication, dosage and frequency of this medication, educated about most common SE and when to notify PCP and or Pharm D if symptoms occur Provided patient with verbal education related to hypoglycemia and importance of correct treatment Lab Results  Component Value Date   HGBA1C 10.8 (A) 05/02/2022          Other     To improve kidney function        Care Coordination Interventions: Assessed the Patient understanding of chronic kidney disease    Evaluation of current treatment plan related to chronic kidney disease self management and patient's adherence to plan as established by provider      Reviewed prescribed water intake, aim for 48-64 oz of water daily unless otherwise directed  Discussed complications of poorly controlled blood pressure and or uncontrolled diabetes, such as kidney impairment   Provided education on kidney disease progression   Last practice recorded BP readings:  BP Readings from Last 3 Encounters:  05/02/22 (!) 160/78  03/09/22 126/71  01/26/22 (!) 143/82  Most recent eGFR/CrCl:  Lab  Results  Component Value Date   EGFR 31 (L) 05/02/2022    No components found for: "CRCL"     Interventions Today    Flowsheet Row Most Recent Value  Chronic Disease   Chronic disease during today's visit Diabetes, Chronic Kidney Disease/End Stage Renal Disease (ESRD)  General Interventions   General Interventions Discussed/Reviewed General Interventions Discussed, General Interventions Reviewed, Labs, Doctor Visits  Labs Kidney Function, Hgb A1c every 3 months  Doctor Visits Discussed/Reviewed Doctor Visits Discussed, Doctor Visits Reviewed, PCP  Education Interventions   Education Provided Provided Education  Provided Verbal Education On Nutrition, Labs, Medication, Blood Sugar Monitoring, When to see the doctor  Labs Reviewed Hgb A1c, Kidney Function  Nutrition Interventions   Nutrition Discussed/Reviewed Nutrition Discussed, Nutrition Reviewed, Carbohydrate meal planning, Increaing proteins, Fluid intake  Pharmacy Interventions   Pharmacy Dicussed/Reviewed Pharmacy Topics Discussed, Pharmacy Topics Reviewed, Medications and their functions          SDOH assessments and interventions completed:  No     Care Coordination Interventions:  Yes, provided   Follow up plan: Follow up call scheduled for 06/06/22 :00 PM     Encounter Outcome:  Pt. Visit Completed

## 2022-05-10 ENCOUNTER — Other Ambulatory Visit (HOSPITAL_COMMUNITY): Payer: Self-pay

## 2022-05-11 ENCOUNTER — Other Ambulatory Visit (HOSPITAL_COMMUNITY): Payer: Self-pay

## 2022-05-12 ENCOUNTER — Ambulatory Visit: Payer: Medicare HMO | Attending: Family Medicine

## 2022-05-12 VITALS — Ht 66.0 in | Wt 200.0 lb

## 2022-05-12 DIAGNOSIS — Z0000 Encounter for general adult medical examination without abnormal findings: Secondary | ICD-10-CM | POA: Diagnosis not present

## 2022-05-12 NOTE — Patient Instructions (Signed)
Adrienne [name] , Thank you for taking time to come for your Medicare Wellness Visit. I appreciate your ongoing commitment to your health goals. Please review the following plan we discussed and let me know if I can assist you in the future.   These are the goals we discussed:  Goals        Diabetes Patient stated goal (pt-stated)      Will have son check my glucose level at least 3 times a week for self knowledge of control. 10/31/19 Encouraged Adrienne [name] to be her own advocate and tell her son she needs her glucose checked and that he needs to help her do this 3 times a week. [facility]      Blood Pressure < 130/80      Care Coordination Activities      Care Coordination Interventions: Assisted patient with ordering over the counter supplies Scheduled call to assist patient with another order in May         HEMOGLOBIN A1C < 7      Hgb A1C was 10.3!!! Encouraged pt to get back on track with minimizing her carb intake and getting some exercise. [facility]      I want to improve my diabetes (pt-stated)      Care Coordination Interventions: Provided education to patient about basic DM disease process Review of patient status, including review of consultants reports, relevant laboratory and other test results, and medications completed Determined patient's A1c is elevated, PCP recommended starting Monjaro 2.5 mg weekly Educated patient regarding indication, dosage and frequency of this medication, educated about most common SE and when to notify PCP and or Pharm D if symptoms occur Provided patient with verbal education related to hypoglycemia and importance of correct treatment Lab Results  Component Value Date   HGBA1C 10.8 (A) 05/02/2022             Patient Stated      05/12/2022, wants to lose weight      To improve kidney function      Care Coordination Interventions: Assessed the Patient understanding of chronic kidney disease    Evaluation of current treatment plan related to chronic kidney  disease self management and patient's adherence to plan as established by provider      Reviewed prescribed water intake, aim for 48-64 oz of water daily unless otherwise directed  Discussed complications of poorly controlled blood pressure and or uncontrolled diabetes, such as kidney impairment   Provided education on kidney disease progression   Last practice recorded BP readings:  BP Readings from Last 3 Encounters:  05/02/22 (!) 160/78  03/09/22 126/71  01/26/22 (!) 143/82  Most recent eGFR/CrCl:  Lab Results  Component Value Date   EGFR 31 (L) 05/02/2022    No components found for: "CRCL"           To practice taking my BP with the help of my daughter      Care Coordination Interventions: Evaluation of current treatment plan related to hypertension self management and patient's adherence to plan as established by provider Reviewed medications with patient and discussed importance of compliance Advised patient, providing education and rationale, to monitor blood pressure daily and record, calling PCP for findings outside established parameters Provided education on prescribed diet low Sodium Educated patient about target BP <130/80 Discussed with patient she plans to practice self checking her BP with her daughter to become more familiar/comfortable  Last practice recorded BP readings:  BP Readings from Last 3 Encounters:  03/09/22 126/71  01/26/22 (!) 143/82  11/22/21 (!) 167/90  Most recent eGFR/CrCl:  Lab Results  Component Value Date   EGFR 32 (L) 08/02/2021    No components found for: "CRCL"              This is a list of the screening recommended for you and due dates:  Health Maintenance  Topic Date Due   Yearly kidney health urinalysis for diabetes  01/03/2017   DEXA scan (bone density measurement)  Never done   Complete foot exam   10/20/2020   COVID-19 Vaccine (4 - 2023-24 season) 09/17/2021   Mammogram  04/09/2022   Flu Shot  08/18/2022    Hemoglobin A1C  11/01/2022   Yearly kidney function blood test for diabetes  05/02/2023   Medicare Annual Wellness Visit  05/12/2023   Pneumonia Vaccine (3 of 3 - PPSV23 or PCV20) 10/20/2024   DTaP/Tdap/Td vaccine (2 - Td or Tdap) 12/14/2025   Colon Cancer Screening  03/09/2032   Hepatitis C Screening: USPSTF Recommendation to screen - Ages 32-79 yo.  Completed   Zoster (Shingles) Vaccine  Completed   HPV Vaccine  Aged Out   Eye exam for diabetics  Discontinued    Advanced directives: Advance directive discussed with you today.   Conditions/risks identified: none  Next appointment: Follow up in one year for your annual wellness visit    Preventive Care 65 Years and Older, Female Preventive care refers to lifestyle choices and visits with your health care provider that can promote health and wellness. What does preventive care include? A yearly physical exam. This is also called an annual well check. Dental exams once or twice a year. Routine eye exams. Ask your health care provider how often you should have your eyes checked. Personal lifestyle choices, including: Daily care of your teeth and gums. Regular physical activity. Eating a healthy diet. Avoiding tobacco and drug use. Limiting alcohol use. Practicing safe sex. Taking low-dose aspirin every day. Taking vitamin and mineral supplements as recommended by your health care provider. What happens during an annual well check? The services and screenings done by your health care provider during your annual well check will depend on your age, overall health, lifestyle risk factors, and family history of disease. Counseling  Your health care provider may ask you questions about your: Alcohol use. Tobacco use. Drug use. Emotional well-being. Home and relationship well-being. Sexual activity. Eating habits. History of falls. Memory and ability to understand (cognition). Work and work Astronomer. Reproductive  health. Screening  You may have the following tests or measurements: Height, weight, and BMI. Blood pressure. Lipid and cholesterol levels. These may be checked every 5 years, or more frequently if you are over 3 years old. Skin check. Lung cancer screening. You may have this screening every year starting at age 52 if you have a 30-pack-year history of smoking and currently smoke or have quit within the past 15 years. Fecal occult blood test (FOBT) of the stool. You may have this test every year starting at age 59. Flexible sigmoidoscopy or colonoscopy. You may have a sigmoidoscopy every 5 years or a colonoscopy every 10 years starting at age 19. Hepatitis C blood test. Hepatitis B blood test. Sexually transmitted disease (STD) testing. Diabetes screening. This is done by checking your blood sugar (glucose) after you have not eaten for a while (fasting). You may have this done every 1-3 years. Bone density scan. This is done to screen for osteoporosis. You may have  this done starting at age 44. Mammogram. This may be done every 1-2 years. Talk to your health care provider about how often you should have regular mammograms. Talk with your health care provider about your test results, treatment options, and if necessary, the need for more tests. Vaccines  Your health care provider may recommend certain vaccines, such as: Influenza vaccine. This is recommended every year. Tetanus, diphtheria, and acellular pertussis (Tdap, Td) vaccine. You may need a Td booster every 10 years. Zoster vaccine. You may need this after age 64. Pneumococcal 13-valent conjugate (PCV13) vaccine. One dose is recommended after age 40. Pneumococcal polysaccharide (PPSV23) vaccine. One dose is recommended after age 48. Talk to your health care provider about which screenings and vaccines you need and how often you need them. This information is not intended to replace advice given to you by your health care provider.  Make sure you discuss any questions you have with your health care provider. Document Released: 01/30/2015 Document Revised: 09/23/2015 Document Reviewed: 11/04/2014 Elsevier Interactive Patient Education  2017 [employer].  Fall Prevention in the Home Falls can cause injuries. They can happen to people of all ages. There are many things you can do to make your home safe and to help prevent falls. What can I do on the outside of my home? Regularly fix the edges of walkways and driveways and fix any cracks. Remove anything that might make you trip as you walk through a door, such as a raised step or threshold. Trim any bushes or trees on the path to your home. Use bright outdoor lighting. Clear any walking paths of anything that might make someone trip, such as rocks or tools. Regularly check to see if handrails are loose or broken. Make sure that both sides of any steps have handrails. Any raised decks and porches should have guardrails on the edges. Have any leaves, snow, or ice cleared regularly. Use sand or salt on walking paths during winter. Clean up any spills in your garage right away. This includes oil or grease spills. What can I do in the bathroom? Use night lights. Install grab bars by the toilet and in the tub and shower. Do not use towel bars as grab bars. Use non-skid mats or decals in the tub or shower. If you need to sit down in the shower, use a plastic, non-slip stool. Keep the floor dry. Clean up any water that spills on the floor as soon as it happens. Remove soap buildup in the tub or shower regularly. Attach bath mats securely with double-sided non-slip rug tape. Do not have throw rugs and other things on the floor that can make you trip. What can I do in the bedroom? Use night lights. Make sure that you have a light by your bed that is easy to reach. Do not use any sheets or blankets that are too big for your bed. They should not hang down onto the floor. Have a  firm chair that has side arms. You can use this for support while you get dressed. Do not have throw rugs and other things on the floor that can make you trip. What can I do in the kitchen? Clean up any spills right away. Avoid walking on wet floors. Keep items that you use a lot in easy-to-reach places. If you need to reach something above you, use a strong step stool that has a grab bar. Keep electrical cords out of the way. Do not use floor polish or  wax that makes floors slippery. If you must use wax, use non-skid floor wax. Do not have throw rugs and other things on the floor that can make you trip. What can I do with my stairs? Do not leave any items on the stairs. Make sure that there are handrails on both sides of the stairs and use them. Fix handrails that are broken or loose. Make sure that handrails are as long as the stairways. Check any carpeting to make sure that it is firmly attached to the stairs. Fix any carpet that is loose or worn. Avoid having throw rugs at the top or bottom of the stairs. If you do have throw rugs, attach them to the floor with carpet tape. Make sure that you have a light switch at the top of the stairs and the bottom of the stairs. If you do not have them, ask someone to add them for you. What else can I do to help prevent falls? Wear shoes that: Do not have high heels. Have rubber bottoms. Are comfortable and fit you well. Are closed at the toe. Do not wear sandals. If you use a stepladder: Make sure that it is fully opened. Do not climb a closed stepladder. Make sure that both sides of the stepladder are locked into place. Ask someone to hold it for you, if possible. Clearly mark and make sure that you can see: Any grab bars or handrails. First and last steps. Where the edge of each step is. Use tools that help you move around (mobility aids) if they are needed. These include: Canes. Walkers. Scooters. Crutches. Turn on the lights when you  go into a dark area. Replace any light bulbs as soon as they burn out. Set up your furniture so you have a clear path. Avoid moving your furniture around. If any of your floors are uneven, fix them. If there are any pets around you, be aware of where they are. Review your medicines with your doctor. Some medicines can make you feel dizzy. This can increase your chance of falling. Ask your doctor what other things that you can do to help prevent falls. This information is not intended to replace advice given to you by your health care provider. Make sure you discuss any questions you have with your health care provider. Document Released: 10/30/2008 Document Revised: 06/11/2015 Document Reviewed: 02/07/2014 Elsevier Interactive Patient Education  2017 [employer].

## 2022-05-12 NOTE — Progress Notes (Signed)
I connected with  Adrienne [name] on 05/12/22 by a audio enabled telemedicine application and verified that I am speaking with the correct person using two identifiers.  Patient Location: Home  Provider Location: Office/Clinic  I discussed the limitations of evaluation and management by telemedicine. The patient expressed understanding and agreed to proceed.  Subjective:   Adrienne [name] is a 67 y.o. female who presents for Medicare Annual (Subsequent) preventive examination.  Review of Systems     Cardiac Risk Factors include: advanced age (>28men, >43 women);diabetes mellitus;dyslipidemia;hypertension;obesity (BMI >30kg/m2)     Objective:    Today's Vitals   05/12/22 1226 05/12/22 1227  Weight: 200 lb (90.7 kg)   Height: 5\' 6"  (1.676 m)   PainSc:  2    Body mass index is 32.28 kg/m.     05/12/2022   12:32 PM 09/10/2021    1:54 PM 07/16/2020    2:37 PM 04/03/2019    1:07 PM 02/13/2019    7:59 PM 08/26/2018   12:17 AM 10/17/2016    3:39 PM  Advanced Directives  Does Patient Have a Medical Advance Directive? No No No Yes No No No  Type of Advance Directive    Out of facility DNR (pink MOST or yellow form)     Would patient like information on creating a medical advance directive?  No - Patient declined No - Patient declined  No - Patient declined No - Guardian declined     Current Medications (verified) Outpatient Encounter Medications as of 05/12/2022  Medication Sig   acetaminophen (TYLENOL) 500 MG tablet Take 500 mg by mouth every 6 (six) hours as needed for moderate pain.   aspirin EC 81 MG tablet Take 81 mg by mouth daily.    atorvastatin (LIPITOR) 80 MG tablet Take 1 tablet (80 mg total) by mouth daily.   carvedilol (COREG) 3.125 MG tablet Take 1 tablet (3.125 mg total) by mouth 2 (two) times daily with a meal.   cetirizine (ZYRTEC) 10 MG tablet Take 1 tablet (10 mg total) by mouth daily.   cholecalciferol (VITAMIN D3) 25 MCG (1000 UNIT) tablet Take 1 tablet  (1,000 Units total) by mouth daily.   clopidogrel (PLAVIX) 75 MG tablet Take 1 tablet (75 mg total) by mouth daily.   Continuous Blood Gluc Receiver (FREESTYLE LIBRE 2 READER) DEVI Please use to check blood sugar three times daily. E11.69   Continuous Blood Gluc Sensor (FREESTYLE LIBRE 2 SENSOR) MISC Please use to check blood sugar three times daily. Change sensors once every 14 days. E11.69   diclofenac Sodium (VOLTAREN) 1 % GEL Apply 4 g topically 4 (four) times daily.   famotidine (PEPCID) 20 MG tablet Take 1 tablet (20 mg total) by mouth 2 (two) times daily.   fluticasone (FLONASE) 50 MCG/ACT nasal spray PLACE 2 SPRAYS INTO BOTH NOSTRILS DAILY.   guaiFENesin-dextromethorphan (ROBITUSSIN DM) 100-10 MG/5ML syrup Take 5 mLs by mouth every 6 (six) hours as needed for cough.   insulin glargine (LANTUS) 100 UNIT/ML Solostar Pen Inject 35 Units into the skin in the morning AND 26 Units every evening.   Insulin Pen Needle 31G X 5 MM MISC use as directed 4 (four) times daily - after meals and at bedtime.   losartan-hydrochlorothiazide (HYZAAR) 100-25 MG tablet Take 1 tablet by mouth daily.   metFORMIN (GLUCOPHAGE) 500 MG tablet Take 1 tablet (500 mg total) by mouth 2 (two) times daily with a meal.   metoCLOPramide (REGLAN) 5 MG tablet Take 1 tablet (  5 mg total) by mouth every 8 (eight) hours as needed for nausea.   Misc. Devices MISC Blood pressure monitor. Dx: Hypertension   predniSONE (DELTASONE) 20 MG tablet Take 1 tablet (20 mg total) by mouth daily with breakfast.   tirzepatide (MOUNJARO) 2.5 MG/0.5ML Pen Inject 2.5 mg into the skin once a week. Then increase to 5 mg once a week   tirzepatide (MOUNJARO) 5 MG/0.5ML Pen Inject 5 mg into the skin once a week. (Patient not taking: Reported on 05/12/2022)   No facility-administered encounter medications on file as of 05/12/2022.    Allergies (verified) Codeine, Hydrocodone, Oxycodone, Penicillins, Clindamycin/lincomycin, Niacin and related, Penicillin  g, and Niacin   History: Past Medical History:  Diagnosis Date   Blindness of both eyes    Cataract    Diabetes    GERD (gastroesophageal reflux disease)    Glaucoma    Heart murmur    High cholesterol    Hypertension    Stroke 2015   R eye   Past Surgical History:  Procedure Laterality Date   CATARACT EXTRACTION, BILATERAL     Laser eye surgery, cataract surgery bi-lat   CESAREAN SECTION     Twins, Breech   EYE SURGERY Bilateral 2012   LASER, CATARACT   TEE WITHOUT CARDIOVERSION N/A 12/31/2013   Procedure: TRANSESOPHAGEAL ECHOCARDIOGRAM (TEE);  Surgeon: Pamella Pert, MD;  Location: [facility] ENDOSCOPY;  Service: Cardiovascular;  Laterality: N/A;  H&P in file   TUBAL LIGATION Bilateral    Family History  Problem Relation Age of Onset   Dementia Mother    Diabetes Father    Hypertension Father    Hypertension Sister    Diabetes Sister    Diabetes Sister    Heart disease Sister    Colon polyps Daughter    Colon cancer Neg Hx    Esophageal cancer Neg Hx    Rectal cancer Neg Hx    Stomach cancer Neg Hx    Social History   Socioeconomic History   Marital status: Divorced    Spouse name: Not on file   Number of children: 3   Years of education: 12   Highest education level: 12th grade  Occupational History    Employer: [employer]  Tobacco Use   Smoking status: Former    Passive exposure: Past   Smokeless tobacco: Never   Tobacco comments:    QUIT IN 1987  Vaping Use   Vaping Use: Never used  Substance and Sexual Activity   Alcohol use: No    Alcohol/week: 0.0 standard drinks of alcohol    Comment: QUIT IN 1987   Drug use: No    Frequency: 2.0 times per week    Comment: QUIT 1980   Sexual activity: Not Currently  Other Topics Concern   Not on file  Social History Narrative   Patient is divorced with 3 children.   Patient is right handed.   Patient has 12 th grade education.   Patient has not been drinking caffeine.   Social Determinants of  Health   Financial Resource Strain: Low Risk  (05/12/2022)   Overall Financial Resource Strain (CARDIA)    Difficulty of Paying Living Expenses: Not hard at all  Food Insecurity: No Food Insecurity (05/12/2022)   Hunger Vital Sign    Worried About Running Out of Food in the Last Year: Never true    Ran Out of Food in the Last Year: Never true  Transportation Needs: No Transportation Needs (05/12/2022)  PRAPARE - Administrator, Civil Service (Medical): No    Lack of Transportation (Non-Medical): No  Physical Activity: Inactive (05/12/2022)   Exercise Vital Sign    Days of Exercise per Week: 0 days    Minutes of Exercise per Session: 0 min  Stress: No Stress Concern Present (05/12/2022)   [employer] of Occupational Health - Occupational Stress Questionnaire    Feeling of Stress : Not at all  Social Connections: Moderately Integrated (03/01/2021)   Social Connection and Isolation Panel [NHANES]    Frequency of Communication with Friends and Family: More than three times a week    Frequency of Social Gatherings with Friends and Family: More than three times a week    Attends Religious Services: More than 4 times per year    Active Member of [employer] or Organizations: Yes    Attends Engineer, structural: More than 4 times per year    Marital Status: Divorced    Tobacco Counseling Counseling given: Not Answered Tobacco comments: QUIT IN 1987   Clinical Intake:  Pre-visit preparation completed: Yes  Pain : 0-10 Pain Score: 2  Pain Type: Acute pain Pain Location: Abdomen Pain Descriptors / Indicators: Aching Pain Onset: Yesterday Pain Frequency: Constant     Nutritional Status: BMI > 30  Obese Nutritional Risks: None Diabetes: Yes  How often do you need to have someone help you when you read instructions, pamphlets, or other written materials from your doctor or pharmacy?: 4 - Often  Diabetic? Yes Nutrition Risk Assessment:  Has the patient had  any N/V/D within the last 2 months?  No  Does the patient have any non-healing wounds?  No  Has the patient had any unintentional weight loss or weight gain?  Yes   Diabetes:  Is the patient diabetic?  Yes  If diabetic, was a CBG obtained today?  No  Did the patient bring in their glucometer from home?  No  How often do you monitor your CBG's? daily.   Financial Strains and Diabetes Management:  Are you having any financial strains with the device, your supplies or your medication? No .  Does the patient want to be seen by Chronic Care Management for management of their diabetes?  No  Would the patient like to be referred to a Nutritionist or for Diabetic Management?  No   Diabetic Exams:  Diabetic Eye Exam: Completed n/a Diabetic Foot Exam: Overdue, Pt has been advised about the importance in completing this exam. Pt is scheduled for diabetic foot exam on next appointment.   Interpreter Needed?: No  Information entered by :: NAllen LPN   Activities of Daily Living    05/12/2022   12:32 PM 09/10/2021    1:55 PM  In your present state of health, do you have any difficulty performing the following activities:  Hearing? 1 0  Comment slight decrease of left ear   Vision? 1 1  Comment blind   Difficulty concentrating or making decisions? 0 0  Walking or climbing stairs? 0 0  Dressing or bathing? 0 0  Doing errands, shopping? 0 0  Preparing Food and eating ? N N  Using the Toilet? N N  In the past six months, have you accidently leaked urine? Y N  Do you have problems with loss of bowel control? N N  Managing your Medications? Y N  Managing your Finances? N N  Housekeeping or managing your Housekeeping? Y N    Patient Care Team:  Hoy Register, MD as PCP - General (Family Medicine) Clarene Duke, Karma Lew, RN as [facility] Care Management [place], Enrique Sack as [facility] Therapist, music  Indicate any recent Medical Services you may have received from  other than [facility] providers in the past year (date may be approximate).     Assessment:   This is a routine wellness examination for Adrienne [name].  Hearing/Vision screen Vision Screening - Comments:: blind  Dietary issues and exercise activities discussed: Current Exercise Habits: The patient does not participate in regular exercise at present   Goals Addressed             This Visit's Progress    Patient Stated       05/12/2022, wants to lose weight       Depression Screen    05/12/2022   12:32 PM 01/26/2022    2:46 PM 09/10/2021    1:55 PM 04/20/2021    3:21 PM 03/01/2021   12:22 PM 11/18/2020    9:28 AM 11/09/2020    4:27 PM  PHQ 2/9 Scores  PHQ - 2 Score 0  0 0 0 1 2  PHQ- 9 Score    6  8 3   Exception Documentation  Patient refusal         Fall Risk    05/12/2022   12:32 PM 05/02/2022    2:11 PM 09/10/2021    1:55 PM 08/02/2021    3:03 PM 07/22/2021    3:35 PM  Fall Risk   Falls in the past year? 0 0 1 1 0  Number falls in past yr: 0 0 0 0 0  Injury with Fall? 0 0 0 1 0  Risk for fall due to : Impaired vision;Medication side effect No Fall Risks   Impaired vision;Impaired balance/gait;Impaired mobility  Follow up Falls prevention discussed;Education provided;Falls evaluation completed    Falls evaluation completed    FALL RISK PREVENTION PERTAINING TO THE HOME:  Any stairs in or around the home? Yes  If so, are there any without handrails? No  Home free of loose throw rugs in walkways, pet beds, electrical cords, etc? Yes  Adequate lighting in your home to reduce risk of falls? Yes   ASSISTIVE DEVICES UTILIZED TO PREVENT FALLS:  Life alert? No  Use of a cane, walker or w/c? No  Grab bars in the bathroom? No  Shower chair or bench in shower? No  Elevated toilet seat or a handicapped toilet? No   TIMED UP AND GO:  Was the test performed? No .      Cognitive Function:    09/10/2021    1:57 PM 07/16/2020    2:39 PM  MMSE - Mini Mental State Exam   Orientation to time 5 5  Orientation to Place 5 5  Registration 3 3  Attention/ Calculation 5 5  Recall 3 3  Language- name 2 objects 2 2  Language- repeat 1 1  Language- follow 3 step command 3 3  Language- read & follow direction 1 1  Write a sentence 1 0  Copy design 1 0  Total score 30 28        05/12/2022   12:35 PM  6CIT Screen  What Year? 0 points  What month? 0 points  What time? 0 points  Count back from 20 0 points  Months in reverse 0 points  Repeat phrase 0 points  Total Score 0 points    Immunizations Immunization History  Administered Date(s) Administered  Fluad Quad(high Dose 65+) 11/22/2021   Influenza,inj,Quad PF,6+ Mos 10/05/2015, 10/18/2016, 10/04/2017, 10/21/2019, 10/19/2020   Influenza-Unspecified 11/02/2013, 11/14/2014   PFIZER(Purple Top)SARS-COV-2 Vaccination 05/20/2019, 06/10/2019, 01/04/2020   Pneumococcal Conjugate-13 10/21/2019   Pneumococcal Polysaccharide-23 11/04/2014   Pneumococcal-Unspecified 11/14/2014   Tdap 12/15/2015   Zoster Recombinat (Shingrix) 01/26/2022, 05/02/2022    TDAP status: Up to date  Flu Vaccine status: Up to date  Pneumococcal vaccine status: Up to date  Covid-19 vaccine status: Completed vaccines  Qualifies for Shingles Vaccine? Yes   Zostavax completed Yes   Shingrix Completed?: Yes  Screening Tests Health Maintenance  Topic Date Due   Diabetic kidney evaluation - Urine ACR  01/03/2017   DEXA SCAN  Never done   FOOT EXAM  10/20/2020   COVID-19 Vaccine (4 - 2023-24 season) 09/17/2021   MAMMOGRAM  04/09/2022   INFLUENZA VACCINE  08/18/2022   Medicare Annual Wellness (AWV)  09/11/2022   HEMOGLOBIN A1C  11/01/2022   Diabetic kidney evaluation - eGFR measurement  05/02/2023   Pneumonia Vaccine 74+ Years old (3 of 3 - PPSV23 or PCV20) 10/20/2024   DTaP/Tdap/Td (2 - Td or Tdap) 12/14/2025   COLONOSCOPY (Pts 45-56yrs Insurance coverage will need to be confirmed)  03/09/2032   Hepatitis C Screening   Completed   Zoster Vaccines- Shingrix  Completed   HPV VACCINES  Aged Out   OPHTHALMOLOGY EXAM  Discontinued    Health Maintenance  Health Maintenance Due  Topic Date Due   Diabetic kidney evaluation - Urine ACR  01/03/2017   DEXA SCAN  Never done   FOOT EXAM  10/20/2020   COVID-19 Vaccine (4 - 2023-24 season) 09/17/2021   MAMMOGRAM  04/09/2022    Colorectal cancer screening: Type of screening: Colonoscopy. Completed 03/09/2022. Repeat every 10 years  Mammogram status: scheduled for 05/25/2022  Bone Density status: Ordered 09/10/2021. Pt provided with contact info and advised to call to schedule appt.  Lung Cancer Screening: (Low Dose CT Chest recommended if Age 61-80 years, 30 pack-year currently smoking OR have quit w/in 15years.) does not qualify.   Lung Cancer Screening Referral: no   Additional Screening:  Hepatitis C Screening: does qualify; Completed 01/04/2016  Vision Screening: Recommended annual ophthalmology exams for early detection of glaucoma and other disorders of the eye. Is the patient up to date with their annual eye exam?   n/a Who is the provider or what is the name of the office in which the patient attends annual eye exams? none If pt is not established with a provider, would they like to be referred to a provider to establish care? No .   Dental Screening: Recommended annual dental exams for proper oral hygiene  Community Resource Referral / Chronic Care Management: CRR required this visit?  No   CCM required this visit?  No      Plan:     I have personally reviewed and noted the following in the patient's chart:   Medical and social history Use of alcohol, tobacco or illicit drugs  Current medications and supplements including opioid prescriptions. Patient is not currently taking opioid prescriptions. Functional ability and status Nutritional status Physical activity Advanced directives List of other physicians Hospitalizations,  surgeries, and ER visits in previous 12 months Vitals Screenings to include cognitive, depression, and falls Referrals and appointments  In addition, I have reviewed and discussed with patient certain preventive protocols, quality metrics, and best practice recommendations. A written personalized care plan for preventive services as well as general preventive health  recommendations were provided to patient.     Barb Merino, LPN   1/61/0960   Nurse Notes: Due to this being a virtual visit, the after visit summary with patients personalized plan was offered to patient via mail or my-chart.  to pick up at office at next visit

## 2022-05-24 ENCOUNTER — Other Ambulatory Visit (HOSPITAL_COMMUNITY): Payer: Self-pay

## 2022-05-24 ENCOUNTER — Ambulatory Visit: Payer: Self-pay | Admitting: *Deleted

## 2022-05-24 ENCOUNTER — Other Ambulatory Visit: Payer: Self-pay

## 2022-05-24 DIAGNOSIS — N1832 Chronic kidney disease, stage 3b: Secondary | ICD-10-CM | POA: Diagnosis not present

## 2022-05-24 DIAGNOSIS — E1122 Type 2 diabetes mellitus with diabetic chronic kidney disease: Secondary | ICD-10-CM | POA: Diagnosis not present

## 2022-05-24 DIAGNOSIS — Z823 Family history of stroke: Secondary | ICD-10-CM | POA: Diagnosis not present

## 2022-05-24 DIAGNOSIS — Z794 Long term (current) use of insulin: Secondary | ICD-10-CM | POA: Diagnosis not present

## 2022-05-24 DIAGNOSIS — Z8249 Family history of ischemic heart disease and other diseases of the circulatory system: Secondary | ICD-10-CM | POA: Diagnosis not present

## 2022-05-24 DIAGNOSIS — J309 Allergic rhinitis, unspecified: Secondary | ICD-10-CM | POA: Diagnosis not present

## 2022-05-24 DIAGNOSIS — K219 Gastro-esophageal reflux disease without esophagitis: Secondary | ICD-10-CM | POA: Diagnosis not present

## 2022-05-24 DIAGNOSIS — E669 Obesity, unspecified: Secondary | ICD-10-CM | POA: Diagnosis not present

## 2022-05-24 DIAGNOSIS — E1165 Type 2 diabetes mellitus with hyperglycemia: Secondary | ICD-10-CM | POA: Diagnosis not present

## 2022-05-24 DIAGNOSIS — H548 Legal blindness, as defined in USA: Secondary | ICD-10-CM | POA: Diagnosis not present

## 2022-05-24 DIAGNOSIS — E785 Hyperlipidemia, unspecified: Secondary | ICD-10-CM | POA: Diagnosis not present

## 2022-05-24 DIAGNOSIS — Z008 Encounter for other general examination: Secondary | ICD-10-CM | POA: Diagnosis not present

## 2022-05-24 DIAGNOSIS — R32 Unspecified urinary incontinence: Secondary | ICD-10-CM | POA: Diagnosis not present

## 2022-05-24 NOTE — Telephone Encounter (Signed)
  Chief Complaint: patient reporting low glucose episode Symptoms: patient reports she woke early am- hypoglycemic- but does not know her level. Frequency: patient had episode early am- this is not the first time- but she does not know her levels because she does not monitor them and she can not check them when having an episode.  Pertinent Negatives: Patient denies fever, frequent urination, difficulty breathing, vomiting Disposition: [] ED /[] Urgent Care (no appt availability in office) / [] Appointment(In office/virtual)/ []  [facility]/ [] Home Care/ [x] Refused Recommended Disposition /[] [facility]/ []  Follow-up with PCP Additional Notes: Offered to make patient an appointment- but she states she told the PA she saw today she would call PCP-and that is what she is doing. Patient may benefit from constant monitor- she is not monitoring her glucose at all. Patient reports she has lost 6 lbs which she is happy with. She is aware she may need an adjustment. I advised patient- I would let provider know- but her PCP may want to change her appointment- this is risky for her.  Reason for Disposition  Low blood sugar prevention, questions about  Answer Assessment - Initial Assessment Questions 1. SYMPTOMS: "What symptoms are you concerned about?"     Patient does not check-patient states she has lost 6 lbs,  patient states  patient didn't eat enough last night- she woke with her glucose low 2. ONSET:  "When did the symptoms start?"     Patient saw PA- was advised to reports she is having low readings.  3. BLOOD GLUCOSE: "What is your blood glucose level?"      Constant monitor- patient does not have an alarm on monitor 4. USUAL RANGE: "What is your blood glucose level usually?" (e.g., usual fasting morning value, usual evening value)     Patient states she had episode early am 5. TYPE 1 or 2:  "Do you know what type of diabetes you have?"  (e.g., Type 1, Type 2, Gestational;  doesn't know)      Type 2 6. INSULIN: "Do you take insulin?" "What type of insulin(s) do you use? What is the mode of delivery? (syringe, pen; injection or pump) "When did you last give yourself an insulin dose?" (i.e., time or hours/minutes ago) "How much did you give?" (i.e., how many units)     Tes- she does take Lantus 35 am,26 pm 7. DIABETES PILLS: "Do you take any pills for your diabetes?" If Yes, ask: "What is the name of the medicine(s) that you take for high blood sugar?"     Metformin  8. OTHER SYMPTOMS: "Do you have any symptoms?" (e.g., fever, frequent urination, difficulty breathing, vomiting)     Confused- hard to function  10. FOOD: "When did you last eat or drink?"       Patient states she is fine as long as she eats her snacks  Protocols used: Diabetes - Low Blood Sugar-A-AH

## 2022-05-24 NOTE — Telephone Encounter (Signed)
Spoke with patient . Verified name & DOB    Patient given VV for 05/26/2022. Advised patient to have her glucometer available the share reading with PCP

## 2022-05-25 ENCOUNTER — Ambulatory Visit: Payer: Medicare HMO

## 2022-05-26 ENCOUNTER — Other Ambulatory Visit: Payer: Self-pay

## 2022-05-26 ENCOUNTER — Telehealth (HOSPITAL_BASED_OUTPATIENT_CLINIC_OR_DEPARTMENT_OTHER): Payer: Medicare HMO | Admitting: Family Medicine

## 2022-05-26 ENCOUNTER — Other Ambulatory Visit (HOSPITAL_COMMUNITY): Payer: Self-pay

## 2022-05-26 ENCOUNTER — Telehealth: Payer: Self-pay | Admitting: Family Medicine

## 2022-05-26 DIAGNOSIS — Z794 Long term (current) use of insulin: Secondary | ICD-10-CM

## 2022-05-26 DIAGNOSIS — E1169 Type 2 diabetes mellitus with other specified complication: Secondary | ICD-10-CM | POA: Diagnosis not present

## 2022-05-26 MED ORDER — FREESTYLE LIBRE 3 SENSOR MISC
6 refills | Status: DC
  Filled 2022-05-26 (×2): qty 2, 28d supply, fill #0
  Filled 2022-08-12 (×2): qty 2, 28d supply, fill #1
  Filled 2022-09-05: qty 2, 28d supply, fill #2
  Filled 2022-09-27: qty 2, 28d supply, fill #3
  Filled 2022-10-28: qty 2, 28d supply, fill #4
  Filled 2022-12-05: qty 2, 28d supply, fill #5
  Filled 2022-12-30: qty 2, 28d supply, fill #6

## 2022-05-26 MED ORDER — FREESTYLE LIBRE 3 READER DEVI
1.0000 | Freq: Three times a day (TID) | 0 refills | Status: DC
  Filled 2022-05-26: qty 1, 30d supply, fill #0

## 2022-05-26 MED ORDER — INSULIN GLARGINE 100 UNIT/ML SOLOSTAR PEN: PEN_INJECTOR | SUBCUTANEOUS | 6 refills | Status: DC

## 2022-05-26 NOTE — Addendum Note (Signed)
Addended by: Lois Huxley, Jeannett Senior L on: 05/26/2022 01:01 PM   Modules accepted: Orders

## 2022-05-26 NOTE — Progress Notes (Signed)
Virtual Visit via Video Note  I connected with Adrienne [name], on 05/26/2022 at 8:36 AM by video enabled telemedicine device and verified that I am speaking with the correct person using two identifiers.   Consent: I discussed the limitations, risks, security and privacy concerns of performing an evaluation and management service by telemedicine and the availability of in person appointments. I also discussed with the patient that there may be a patient responsible charge related to this service. The patient expressed understanding and agreed to proceed.   Location of Patient: Home  Location of Provider: Clinic   Persons participating in Telemedicine visit: Adrienne [name] - friend Dr. Alvis Lemmings     History of Present Illness: Adrienne [name] is a 67 y.o. 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    She had an episode of hypoglycemia in which she broke out in a  sweat and then ate something to bring up her sugar. This occurred around 4am 2 days ago. She admits she did not have a heavy dinner the night before.  Since she is visually impaired she was not able to check her sugar. I had previously ordered a CGM for her which she never picked up as she stated she wanted to use her old glucometer as she had a lot of old supplies. She has been eating one meal a day in addition to fruits.  Currently on 25 units of Lantus at night and 35 Lantus in the morning.  [name] was initiated at her last visit. Blood sugar is 104 fasting this morning. 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     aspirin EC 81 MG tablet Take 81 mg by mouth daily.      atorvastatin (LIPITOR) 80 MG tablet Take 1 tablet (80 mg total) by mouth daily. 90 tablet 1   carvedilol (COREG) 3.125 MG tablet Take 1 tablet (3.125 mg total) by mouth 2 (two) times daily with a meal. 180 tablet 1   cetirizine (ZYRTEC) 10 MG tablet Take 1 tablet (10 mg total) by mouth daily. 30 tablet 1   cholecalciferol (VITAMIN D3) 25 MCG (1000 UNIT) tablet Take 1 tablet (1,000 Units total) by mouth daily. 30 tablet 3   clopidogrel (PLAVIX) 75 MG tablet Take 1 tablet (75 mg total) by mouth daily. 90 tablet 1   Continuous Blood Gluc Receiver (FREESTYLE LIBRE 2 READER) DEVI Please use to  check blood sugar three times daily. E11.69 1 each 0   Continuous Blood Gluc Sensor (FREESTYLE LIBRE 2 SENSOR) MISC Please use to check blood sugar three times daily. Change sensors once every 14 days. E11.69 2 each 3   diclofenac Sodium (VOLTAREN) 1 % GEL Apply 4 g topically 4 (four) times daily. 100 g 1   famotidine (PEPCID) 20 MG tablet Take 1 tablet (20 mg total) by mouth 2 (two) times daily. 180 tablet 1   fluticasone (FLONASE) 50 MCG/ACT nasal spray PLACE 2 SPRAYS INTO BOTH NOSTRILS DAILY. 16 g 2   guaiFENesin-dextromethorphan (ROBITUSSIN DM) 100-10 MG/5ML syrup Take 5 mLs by mouth every 6 (six) hours as needed for cough. 118 mL 0   insulin glargine (LANTUS) 100 UNIT/ML  Solostar Pen Inject 35 Units into the skin in the morning AND 26 Units every evening. 30 mL 6   Insulin Pen Needle 31G X 5 MM MISC use as directed 4 (four) times daily - after meals and at bedtime. 100 each 3   losartan-hydrochlorothiazide (HYZAAR) 100-25 MG tablet Take 1 tablet by mouth daily. 90 tablet 1   metFORMIN (GLUCOPHAGE) 500 MG tablet Take 1 tablet (500 mg total) by mouth 2 (two) times daily with a meal. 180 tablet 1   metoCLOPramide (REGLAN) 5 MG tablet Take 1 tablet (5 mg total) by mouth every 8 (eight) hours as needed for nausea. 90 tablet 1   Misc. Devices MISC Blood pressure monitor. Dx: Hypertension 1 each 0   predniSONE (DELTASONE) 20 MG tablet Take 1 tablet (20 mg total) by mouth daily with breakfast. 5 tablet 0   tirzepatide (MOUNJARO) 2.5 MG/0.5ML Pen Inject 2.5 mg into the skin once a week. Then increase to 5 mg once a week 2 mL 0   tirzepatide (MOUNJARO) 5 MG/0.5ML Pen Inject 5 mg into the skin once a week. (Patient not taking: Reported on 05/12/2022) 6 mL 2   No current facility-administered medications on file prior to visit.    ROS: See HPI  Observations/Objective: Awake, alert, oriented x3 Not in acute distress Normal mood Psych normal Extremities with full range of motion     Latest Ref Rng & Units 05/02/2022    3:28 PM 08/02/2021    4:00 PM 04/20/2021    4:07 PM  CMP  Glucose 70 - 99 mg/dL 454  098  119   BUN 8 - 27 mg/dL 39  25  35   Creatinine 0.57 - 1.00 mg/dL 1.47  8.29  5.62   Sodium 134 - 144 mmol/L 136  138  139   Potassium 3.5 - 5.2 mmol/L 4.6  4.6  4.2   Chloride 96 - 106 mmol/L 97  100  100   CO2 20 - 29 mmol/L 20  22  22    Calcium 8.7 - 10.3 mg/dL 13.0  9.4  86.5   Total Protein 6.0 - 8.5 g/dL 6.9  6.9  6.8   Total Bilirubin 0.0 - 1.2 mg/dL 0.3  <7.8  0.3   Alkaline Phos 44 - 121 IU/L 120  105  101   AST 0 - 40 IU/L 14  15  16    ALT 0 - 32 IU/L 20  19  17      Lipid Panel     Component Value Date/Time   CHOL 202 (H) 05/07/2019 1049    TRIG 122 05/07/2019 1049   HDL 46 05/07/2019 1049   CHOLHDL 4.4 05/07/2019 1049   CHOLHDL 2.7 01/04/2016 1138  VLDL 12 01/04/2016 1138   LDLCALC 134 (H) 05/07/2019 1049   LABVLDL 22 05/07/2019 1049    Lab Results  Component Value Date   HGBA1C 10.8 (A) 05/02/2022     Assessment and Plan: 1. Type 2 diabetes mellitus with other specified complication, with long-term current use of insulin (HCC) Uncontrolled with A1c of 10.8, goal is less than 7.0 She is experiencing hypoglycemia due to decreased oral intake likely as a result of initiation of Mounjaro Advised to avoid skipping meals and try to eat even a small meal at least 3 times a day I will send a prescription for CGM to her pharmacy and we will send her alerts for hypoglycemia at a higher than normal threshold to prevent hypoglycemia given she is visually impaired Once she obtains her meter she will schedule a visit with the clinical pharmacist to set up this target. She has someone check on her at least every day and I have advised her to have them review her blood sugar logs with her every day when they visit. - insulin glargine (LANTUS) 100 UNIT/ML Solostar Pen; Inject 35 Units into the skin in the morning AND 22 Units every evening.  Dispense: 30 mL; Refill: 6   Follow Up Instructions: Return for previously scheduled appointment.    I discussed the assessment and treatment plan with the patient. The patient was provided an opportunity to ask questions and all were answered. The patient agreed with the plan and demonstrated an understanding of the instructions.   The patient was advised to call back or seek an in-person evaluation if the symptoms worsen or if the condition fails to improve as anticipated.     I provided 18 minutes total of Telehealth time during this encounter including median intraservice time, reviewing previous notes, investigations, ordering medications, medical decision making, coordinating care and  patient verbalized understanding at the end of the visit.     Hoy Register, MD, FAAFP. [facility] [place], [place] [phone]   05/26/2022, 8:36 AM

## 2022-05-26 NOTE — Telephone Encounter (Signed)
Yes ma'am, looping Tresa Endo in now.   Tresa Endo,   We may end up having to send these to a DME provider like [facility]. However, I think she also may need to have a PA approved. I want to try and get her approval for Libre 3. We can try for just the sensors first as she can install the Jenner 3 app on her phone. The libre 3 app has a voice feature that assists visually impaired patients. Do you think it's be possible to submit a PA?

## 2022-05-26 NOTE — Patient Instructions (Signed)
Hypoglycemia Hypoglycemia occurs when the level of sugar (glucose) in the blood is too low. Hypoglycemia can happen in people who have or do not have diabetes. It can develop quickly, and it can be a medical emergency. For most people, a blood glucose level below 70 mg/dL (3.9 mmol/L) is considered hypoglycemia. Glucose is a type of sugar that provides the body's main source of energy. Certain hormones (insulin and glucagon) control the level of glucose in the blood. Insulin lowers blood glucose, and glucagon raises blood glucose. Hypoglycemia can result from having too much insulin in the bloodstream, or from not eating enough food that contains glucose. You may also have reactive hypoglycemia, which happens within 4 hours after eating a meal. What are the causes? Hypoglycemia occurs most often in people who have diabetes and may be caused by: Diabetes medicine. Not eating enough, or not eating often enough. Increased physical activity. Drinking alcohol on an empty stomach. If you do not have diabetes, hypoglycemia may be caused by: A tumor in the pancreas. Not eating enough, or not eating for long periods at a time (fasting). A severe infection or illness. Problems after having bariatric surgery. Organ failure, such as kidney or liver failure. Certain medicines. What increases the risk? Hypoglycemia is more likely to develop in people who: Have diabetes and take medicines to lower blood glucose. Abuse alcohol. Have a severe illness. What are the signs or symptoms? Symptoms vary depending on whether the condition is mild, moderate, or severe. Mild hypoglycemia Hunger. Sweating and feeling clammy. Dizziness or feeling light-headed. Sleepiness or restless sleep. Nausea. Increased heart rate. Headache. Blurry vision. Mood changes, such as irritability or anxiety. Tingling or numbness around the mouth, lips, or tongue. Moderate hypoglycemia Confusion and poor judgment. Behavior  changes. Weakness. Irregular heartbeat. A change in coordination. Severe hypoglycemia Severe hypoglycemia is a medical emergency. It can cause: Fainting. Seizures. Loss of consciousness (coma). Death. How is this diagnosed? Hypoglycemia is diagnosed with a blood test to measure your blood glucose level. This blood test is done while you are having symptoms. Your health care provider may also do a physical exam and review your medical history. How is this treated? This condition can be treated by immediately eating or drinking something that contains sugar with 15 grams of fast-acting carbohydrate, such as: 4 oz (120 mL) of fruit juice. 4 oz (120 mL) of regular soda (not diet soda). Several pieces of hard candy. Check food labels to find out how many pieces to eat for 15 grams. 1 Tbsp (15 mL) of sugar or honey. 4 glucose tablets. 1 tube of glucose gel. Treating hypoglycemia if you have diabetes If you are alert and able to swallow safely, follow the 15:15 rule: Take 15 grams of a fast-acting carbohydrate. Talk with your health care provider about how much you should take. Options for getting 15 grams of fast-acting carbohydrate include: Glucose tablets (take 4 tablets). Several pieces of hard candy. Check food labels to find out how many pieces to eat for 15 grams. 4 oz (120 mL) of fruit juice. 4 oz (120 mL) of regular soda (not diet soda). 1 Tbsp (15 mL) of sugar or honey. 1 tube of glucose gel. Check your blood glucose 15 minutes after you take the carbohydrate. If the repeat blood glucose level is still at or below 70 mg/dL (3.9 mmol/L), take 15 grams of a carbohydrate again. If your blood glucose level does not increase above 70 mg/dL (3.9 mmol/L) after 3 tries, seek emergency   medical care. After your blood glucose level returns to normal, eat a meal or a snack within 1 hour.  Treating severe hypoglycemia Severe hypoglycemia is when your blood glucose level is below 54 mg/dL (3  mmol/L). Severe hypoglycemia is a medical emergency. Get medical help right away. If you have severe hypoglycemia and you cannot eat or drink, you will need to be given glucagon. A family member or close friend should learn how to check your blood glucose and how to give you glucagon. Ask your health care provider if you need to have an emergency glucagon kit available. Severe hypoglycemia may need to be treated in a hospital. The treatment may include getting glucose through an IV. You may also need treatment for the cause of your hypoglycemia. Follow these instructions at home:  General instructions Take over-the-counter and prescription medicines only as told by your health care provider. Monitor your blood glucose as told by your health care provider. If you drink alcohol: Limit how much you have to: 0-1 drink a day for women who are not pregnant. 0-2 drinks a day for men. Know how much alcohol is in your drink. In the [place] one drink equals one 12 oz bottle of beer (355 mL), one 5 oz glass of wine (148 mL), or one 1 oz glass of hard liquor (44 mL). Be sure to eat food along with drinking alcohol. Be aware that alcohol is absorbed quickly and may have lingering effects that may result in hypoglycemia later. Be sure to do ongoing glucose monitoring. Keep all follow-up visits. This is important. If you have diabetes: Always have a fast-acting carbohydrate (15 grams) option with you to treat low blood glucose. Follow your diabetes management plan as directed by your health care provider. Make sure you: Know the symptoms of hypoglycemia. It is important to treat it right away to prevent it from becoming severe. Check your blood glucose as often as told. Always check before and after exercise. Always check your blood glucose before you drive a motorized vehicle. Take your medicines as told. Follow your meal plan. Eat on time, and do not skip meals. Share your diabetes management plan with  people in your workplace, school, and household. Carry a medical alert card or wear medical alert jewelry. Where to find more information American Diabetes Association: www.diabetes.org Contact a health care provider if: You have problems keeping your blood glucose in your target range. You have frequent episodes of hypoglycemia. Get help right away if: You continue to have hypoglycemia symptoms after eating or drinking something that contains 15 grams of fast-acting carbohydrate, and you cannot get your blood glucose above 70 mg/dL (3.9 mmol/L) while following the 15:15 rule. Your blood glucose is below 54 mg/dL (3 mmol/L). You have a seizure. You faint. These symptoms may represent a serious problem that is an emergency. Do not wait to see if the symptoms will go away. Get medical help right away. Call your local emergency services (911 in the [place] Do not drive yourself to the hospital. Summary Hypoglycemia occurs when the level of sugar (glucose) in the blood is too low. Hypoglycemia can happen in people who have or do not have diabetes. It can develop quickly, and it can be a medical emergency. Make sure you know the symptoms of hypoglycemia and how to treat it. Always have a fast-acting carbohydrate option with you to treat low blood sugar. This information is not intended to replace advice given to you by your health care provider.   Make sure you discuss any questions you have with your health care provider. Document Revised: 12/05/2019 Document Reviewed: 12/05/2019 Elsevier Patient Education  2023 Elsevier Inc.  

## 2022-05-26 NOTE — Telephone Encounter (Signed)
This patient is experiencing hypoglycemia.  She will benefit from a CGM and I know we prescribed this previously.  Can you see if her insurance will cover the freestyle libre 3 or the Dexcom G7? I have advised her that once she obtains this she needs to come into the clinic to see you.  Given she is visually impaired I would like her threshold for hypoglycemic alerts higher than the normal range and may be at about 80 rather than 60.  Thank you.

## 2022-05-27 ENCOUNTER — Other Ambulatory Visit (HOSPITAL_COMMUNITY): Payer: Self-pay

## 2022-05-27 ENCOUNTER — Other Ambulatory Visit: Payer: Self-pay

## 2022-05-30 ENCOUNTER — Other Ambulatory Visit (HOSPITAL_COMMUNITY): Payer: Self-pay

## 2022-05-30 ENCOUNTER — Other Ambulatory Visit: Payer: Self-pay

## 2022-05-31 ENCOUNTER — Telehealth: Payer: Self-pay

## 2022-05-31 ENCOUNTER — Other Ambulatory Visit: Payer: Self-pay | Admitting: Pharmacist

## 2022-05-31 ENCOUNTER — Telehealth: Payer: Self-pay | Admitting: Family Medicine

## 2022-05-31 ENCOUNTER — Other Ambulatory Visit: Payer: Self-pay

## 2022-05-31 DIAGNOSIS — E1169 Type 2 diabetes mellitus with other specified complication: Secondary | ICD-10-CM

## 2022-05-31 MED ORDER — INSULIN GLARGINE 100 UNIT/ML SOLOSTAR PEN
PEN_INJECTOR | SUBCUTANEOUS | 6 refills | Status: DC
Start: 2022-05-31 — End: 2022-07-26
  Filled 2022-05-31 – 2022-07-18 (×3): qty 30, 53d supply, fill #0

## 2022-05-31 NOTE — Patient Outreach (Signed)
  Care Coordination   05/31/2022 Name: CACIE GOUDY MRN: [id number] DOB: Jun 28, 1955   Care Coordination Outreach Attempts:  An unsuccessful telephone outreach was attempted for a scheduled appointment today.  Follow Up Plan:  Additional outreach attempts will be made to offer the patient care coordination information and services.   Encounter Outcome:  No Answer   Care Coordination Interventions:  No, not indicated    Bevelyn Ngo, BSW, CDP Social Worker, Certified Dementia Practitioner [facility] Care Management  Care Coordination [phone]

## 2022-05-31 NOTE — Telephone Encounter (Signed)
Pt is calling in because she was prescribed an automated insulin and the dosage is currently out of stock at her current pharmacy. Pt wants to know if the medication can be sent to [facility]. Please advise.

## 2022-06-01 ENCOUNTER — Ambulatory Visit: Payer: Self-pay | Admitting: *Deleted

## 2022-06-01 ENCOUNTER — Other Ambulatory Visit (HOSPITAL_COMMUNITY): Payer: Self-pay

## 2022-06-01 ENCOUNTER — Other Ambulatory Visit: Payer: Self-pay

## 2022-06-01 ENCOUNTER — Other Ambulatory Visit: Payer: Self-pay | Admitting: Family Medicine

## 2022-06-01 ENCOUNTER — Other Ambulatory Visit: Payer: Self-pay | Admitting: Pharmacist

## 2022-06-01 DIAGNOSIS — Z794 Long term (current) use of insulin: Secondary | ICD-10-CM

## 2022-06-01 MED ORDER — TIRZEPATIDE 2.5 MG/0.5ML ~~LOC~~ SOAJ
2.5000 mg | SUBCUTANEOUS | 0 refills | Status: DC
Start: 2022-06-01 — End: 2022-07-26
  Filled 2022-06-01: qty 2, 28d supply, fill #0

## 2022-06-01 NOTE — Telephone Encounter (Signed)
Thank you :)

## 2022-06-01 NOTE — Telephone Encounter (Signed)
Message from [place] sent at 06/01/2022 10:39 AM EDT  Summary: Pt request that a nurse return her call regarding Rx refill request   Pt stated she is suppose to take the tirzepatide [facility]) 2.5 MG today and requests that a nurse return her call. Pt informed that the Rx refill request was submitted and the turnaround time can vary between 24-72 hours. Pt seemed upset about this because she said she requested it yesterday and requests that a nurse return her call. Cb# [phone]         Patient states she needs she injection today- patient advised medication is pending for PCP approval- I will let her know that it is needed today Reason for Disposition  [1] Prescription refill request for ESSENTIAL medicine (i.e., likelihood of harm to patient if not taken) AND [2] triager unable to refill per department policy  Answer Assessment - Initial Assessment Questions 1. DRUG NAME: "What medicine do you need to have refilled?"     Tirzeppatide( Mounjaro) 2. REFILLS REMAINING: "How many refills are remaining?" (Note: The label on the medicine or pill bottle will show how many refills are remaining. If there are no refills remaining, then a renewal may be needed.)     none 3. EXPIRATION DATE: "What is the expiration date?" (Note: The label states when the prescription will expire, and thus can no longer be refilled.)     na 4. PRESCRIBING HCP: "Who prescribed it?" Reason: If prescribed by specialist, call should be referred to that group.     PCP Patient advised medication is pending provider approval- she is In office today and hopefully will be able to fill this for her.  Protocols used: Medication Refill and Renewal Call-A-AH

## 2022-06-01 NOTE — Telephone Encounter (Signed)
  Chief Complaint: medication refill- mounjaro  Disposition: [] ED /[] Urgent Care (no appt availability in office) / [] Appointment(In office/virtual)/ []  [facility]/ [] Home Care/ [] Refused Recommended Disposition /[]  Mobile Bus/ [x]  Follow-up with PCP Additional Notes: Patient is calling to follow up on RF- she need today. Rx is off protocol medication and requires provider review- it is pending provider refill. Patient aware provider has to RF and hopefully she will be able to review today.

## 2022-06-06 ENCOUNTER — Ambulatory Visit: Payer: Self-pay

## 2022-06-06 NOTE — Patient Outreach (Signed)
  Care Coordination   Follow Up Visit Note   06/06/2022 Name: SHUNTA HECKSTALL MRN: [id number] DOB: [birth year]  [name] is a 67 y.o. year old female who sees Hoy Register, MD for primary care. I spoke with  [name] by phone today.  What matters to the patients health and wellness today?  Patient would like to have her Libre 3 sensor changed with the help of her daughter/niece. She will schedule an appointment with her PCP in order to have the settings on her reader set.     Goals Addressed               This Visit's Progress     Patient Stated     I want to improve my diabetes (pt-stated)        Care Coordination Interventions: Evaluation of current treatment plan related to diabetes and patient's adherence to plan as established by provider Reviewed medications with patient and discussed importance of medication adherence Determined patient received her Libre 3 sensor, she is not sure if she received the reader Discussed patient plans to ask her daughter or niece to help replace her sensor, she will ask her daughter and or niece to help her determine if a reader is needed  Reviewed and discussed PCP recommendations for patient to bring in her reader in order to have her default parameters set  Discussed and reviewed recent hypoglycemic event, patient denies having further episodes Instructed patient to notify PCP promptly of new symptoms or concerns  Reviewed and discussed next scheduled PCP visit scheduled for 07/26/22 @3 :10 PM    Interventions Today    Flowsheet Row Most Recent Value  Chronic Disease   Chronic disease during today's visit Diabetes  General Interventions   General Interventions Discussed/Reviewed General Interventions Discussed, General Interventions Reviewed, Doctor Visits  Doctor Visits Discussed/Reviewed Doctor Visits Discussed, Doctor Visits Reviewed, PCP  Education Interventions   Education Provided Provided Education   Provided Verbal Education On Medication, Blood Sugar Monitoring  Pharmacy Interventions   Pharmacy Dicussed/Reviewed Medications and their functions, Pharmacy Topics Discussed, Pharmacy Topics Reviewed          SDOH assessments and interventions completed:  No     Care Coordination Interventions:  Yes, provided   Follow up plan: Follow up call scheduled for 07/28/22 @11 :30 AM    Encounter Outcome:  Pt. Visit Completed

## 2022-06-06 NOTE — Patient Instructions (Signed)
Visit Information  Thank you for taking time to visit with me today. Please don't hesitate to contact me if I can be of assistance to you.   Following are the goals we discussed today:   Goals Addressed               This Visit's Progress     Patient Stated     I want to improve my diabetes (pt-stated)        Care Coordination Interventions: Evaluation of current treatment plan related to diabetes and patient's adherence to plan as established by provider Reviewed medications with patient and discussed importance of medication adherence Determined patient received her Libre 3 sensor, she is not sure if she received the reader Discussed patient plans to ask her daughter or niece to help replace her sensor, she will ask her daughter and or niece to help her determine if a reader is needed  Reviewed and discussed PCP recommendations for patient to bring in her reader in order to have her default parameters set  Discussed and reviewed recent hypoglycemic event, patient denies having further episodes Instructed patient to notify PCP promptly of new symptoms or concerns  Reviewed and discussed next scheduled PCP visit scheduled for 07/26/22 @3 :10 PM           Our next appointment is by telephone on 07/28/22 at 11;30 AM  Please call the care guide team at [phone] if you need to cancel or reschedule your appointment.   If you are experiencing a Mental Health or Behavioral Health Crisis or need someone to talk to, please call 1-800-273-TALK (toll free, 24 hour hotline) go to [facility] [address] [place] [phone])  The patient verbalized understanding of instructions, educational materials, and care plan provided today and DECLINED offer to receive copy of patient instructions, educational materials, and care plan.   Delsa Sale, RN, BSN, CCM Care Management Coordinator [facility] Care Management  Direct Phone: [phone]

## 2022-06-07 ENCOUNTER — Ambulatory Visit: Payer: Self-pay

## 2022-06-07 NOTE — Patient Instructions (Signed)
Visit Information  Thank you for taking time to visit with me today. Please don't hesitate to contact me if I can be of assistance to you.   Following are the goals we discussed today:  -Contact me as needed prior to next scheduled call   Our next appointment is by telephone on 7/23 at 2:45  Please call the care guide team at [phone] if you need to cancel or reschedule your appointment.   If you are experiencing a Mental Health or Behavioral Health Crisis or need someone to talk to, please call 911  The patient verbalized understanding of instructions, educational materials, and care plan provided today and DECLINED offer to receive copy of patient instructions, educational materials, and care plan.   Bevelyn Ngo, BSW, CDP Social Worker, Certified Dementia Practitioner [facility] Management  Care Coordination [phone]

## 2022-06-07 NOTE — Patient Outreach (Signed)
  Care Coordination   Follow Up Visit Note   06/07/2022 Name: Adrienne [name] MRN: [id number] DOB: 07/15/1955  Adrienne [name] is a 67 y.o. year old female who sees Hoy Register, MD for primary care. I spoke with  Adrienne [name] by phone today.  What matters to the patients health and wellness today?  Patient would like to order supplies using her OTC benefit    Goals Addressed             This Visit's Progress    Care Coordination Activities   On track    Care Coordination Interventions: Assisted patient with ordering over the counter supplies Assessed for acute care coordination needs - none identified Scheduled call to assist patient with another order in July         SDOH assessments and interventions completed:  No     Care Coordination Interventions:  Yes, provided   Interventions Today    Flowsheet Row Most Recent Value  Chronic Disease   Chronic disease during today's visit Diabetes  General Interventions   General Interventions Discussed/Reviewed General Interventions Reviewed        Follow up plan: Follow up call scheduled for 7/23    Encounter Outcome:  Pt. Visit Completed   Bevelyn Ngo, Kenard Gower, CDP Social Worker, Certified Dementia Practitioner [facility] Care Management  Care Coordination [phone]

## 2022-06-16 ENCOUNTER — Other Ambulatory Visit (HOSPITAL_COMMUNITY): Payer: Self-pay

## 2022-06-16 ENCOUNTER — Other Ambulatory Visit: Payer: Self-pay

## 2022-06-27 ENCOUNTER — Other Ambulatory Visit (HOSPITAL_COMMUNITY): Payer: Self-pay

## 2022-06-27 ENCOUNTER — Other Ambulatory Visit: Payer: Self-pay

## 2022-06-27 ENCOUNTER — Other Ambulatory Visit: Payer: Self-pay | Admitting: Family Medicine

## 2022-06-27 DIAGNOSIS — J329 Chronic sinusitis, unspecified: Secondary | ICD-10-CM

## 2022-06-27 MED ORDER — FLUTICASONE PROPIONATE 50 MCG/ACT NA SUSP
2.0000 | Freq: Every day | NASAL | 2 refills | Status: AC
Start: 2022-06-27 — End: ?
  Filled 2022-06-27: qty 16, 30d supply, fill #0
  Filled 2022-09-27: qty 16, 30d supply, fill #1
  Filled 2023-01-13: qty 16, 30d supply, fill #2

## 2022-06-28 ENCOUNTER — Ambulatory Visit: Payer: Self-pay | Admitting: *Deleted

## 2022-06-28 NOTE — Telephone Encounter (Signed)
Summary: Questions regarding higher dose RX   Rx: Tirzepatide [facility]) 2.5 MG/0.5ML Pen [[id number]]  will be starting this rx (higher dose) tomorrow. Pt has a Questions         Chief Complaint: medication questions regarding increasing dose of mounjaro to 5 mg and if lantus should be decreased Symptoms: none  Frequency: na  Pertinent Negatives: Patient denies na Disposition: [] ED /[] Urgent Care (no appt availability in office) / [] Appointment(In office/virtual)/ []  [place] [facility]/ [] Home Care/ [] Refused Recommended Disposition /[] [place] [facility]/ [x]  Follow-up with PCP Additional Notes:  Patient reports she now has mounjaro 5 mg dose from pharmacy and is supposed to start increased dose tomorrow. Is there anything specific she should know before starting increased dose? Reviewed with patient to monitor if sx  of low blood sugar occur and to keep log of blood glucose as directed by PCP. Patient would like to know if lantus needs to be decreased since starting increased dose of mounjaro to 5 mg? Reviewed to continue prescribed dose of lantus until PCP orders differently. Please advise.     Reason for Disposition  [1] Caller has NON-URGENT medicine question about med that PCP prescribed AND [2] triager unable to answer question  Answer Assessment - Initial Assessment Questions 1. NAME of MEDICINE: "What medicine(s) are you calling about?"     Mounjaro 5mg  injections, insulin lantus doses 2. QUESTION: "What is your question?" (e.g., double dose of medicine, side effect)     Is there anything differently to do before starting increase dose of mounjaro 5 mg before tomorrow? Does PCP want to decrease dose of lantus? 3. PRESCRIBER: "Who prescribed the medicine?" Reason: if prescribed by specialist, call should be referred to that group.     PCP 4. SYMPTOMS: "Do you have any symptoms?" If Yes, ask: "What symptoms are you having?"  "How bad are the symptoms (e.g., mild, moderate,  severe)     No  5. PREGNANCY:  "Is there any chance that you are pregnant?" "When was your last menstrual period?"     Na  Protocols used: Medication Question Call-A-AH

## 2022-07-15 ENCOUNTER — Other Ambulatory Visit (HOSPITAL_COMMUNITY): Payer: Self-pay

## 2022-07-15 ENCOUNTER — Other Ambulatory Visit: Payer: Self-pay | Admitting: Family Medicine

## 2022-07-15 DIAGNOSIS — K219 Gastro-esophageal reflux disease without esophagitis: Secondary | ICD-10-CM

## 2022-07-15 MED ORDER — FAMOTIDINE 20 MG PO TABS
20.0000 mg | ORAL_TABLET | Freq: Two times a day (BID) | ORAL | 1 refills | Status: DC
Start: 2022-07-15 — End: 2022-11-01
  Filled 2022-07-15: qty 180, 90d supply, fill #0
  Filled 2022-09-27: qty 180, 90d supply, fill #1

## 2022-07-18 ENCOUNTER — Other Ambulatory Visit: Payer: Self-pay

## 2022-07-18 ENCOUNTER — Other Ambulatory Visit (HOSPITAL_COMMUNITY): Payer: Self-pay

## 2022-07-23 ENCOUNTER — Other Ambulatory Visit (HOSPITAL_COMMUNITY): Payer: Self-pay

## 2022-07-26 ENCOUNTER — Other Ambulatory Visit (HOSPITAL_COMMUNITY): Payer: Self-pay

## 2022-07-26 ENCOUNTER — Ambulatory Visit: Payer: Medicare HMO | Admitting: Family Medicine

## 2022-07-26 ENCOUNTER — Encounter: Payer: Self-pay | Admitting: Family Medicine

## 2022-07-26 ENCOUNTER — Other Ambulatory Visit: Payer: Self-pay

## 2022-07-26 DIAGNOSIS — E785 Hyperlipidemia, unspecified: Secondary | ICD-10-CM

## 2022-07-26 DIAGNOSIS — N1832 Chronic kidney disease, stage 3b: Secondary | ICD-10-CM

## 2022-07-26 DIAGNOSIS — E1122 Type 2 diabetes mellitus with diabetic chronic kidney disease: Secondary | ICD-10-CM | POA: Diagnosis not present

## 2022-07-26 DIAGNOSIS — E1169 Type 2 diabetes mellitus with other specified complication: Secondary | ICD-10-CM

## 2022-07-26 DIAGNOSIS — Z794 Long term (current) use of insulin: Secondary | ICD-10-CM | POA: Diagnosis not present

## 2022-07-26 DIAGNOSIS — I129 Hypertensive chronic kidney disease with stage 1 through stage 4 chronic kidney disease, or unspecified chronic kidney disease: Secondary | ICD-10-CM

## 2022-07-26 LAB — POCT GLYCOSYLATED HEMOGLOBIN (HGB A1C): Hemoglobin A1C: 7.2 % — AB (ref 4.0–5.6)

## 2022-07-26 LAB — GLUCOSE, POCT (MANUAL RESULT ENTRY): POC Glucose: 100 mg/dl — AB (ref 70–99)

## 2022-07-26 MED ORDER — ATORVASTATIN CALCIUM 80 MG PO TABS
80.0000 mg | ORAL_TABLET | Freq: Every day | ORAL | 1 refills | Status: DC
Start: 2022-07-26 — End: 2023-02-07
  Filled 2022-07-26 – 2022-09-27 (×2): qty 90, 90d supply, fill #0
  Filled 2023-01-13: qty 90, 90d supply, fill #1

## 2022-07-26 MED ORDER — TIRZEPATIDE 7.5 MG/0.5ML ~~LOC~~ SOAJ
7.5000 mg | SUBCUTANEOUS | 0 refills | Status: DC
Start: 2022-07-26 — End: 2022-09-22

## 2022-07-26 MED ORDER — INSULIN GLARGINE 100 UNIT/ML SOLOSTAR PEN
PEN_INJECTOR | SUBCUTANEOUS | 6 refills | Status: DC
Start: 2022-07-26 — End: 2022-07-27
  Filled 2022-07-26: qty 30, 63d supply, fill #0

## 2022-07-26 MED ORDER — CLOPIDOGREL BISULFATE 75 MG PO TABS
75.0000 mg | ORAL_TABLET | Freq: Every day | ORAL | 1 refills | Status: DC
Start: 2022-07-26 — End: 2023-02-07
  Filled 2022-07-26 – 2022-09-27 (×2): qty 90, 90d supply, fill #0
  Filled 2023-01-13: qty 90, 90d supply, fill #1

## 2022-07-26 MED ORDER — LOSARTAN POTASSIUM-HCTZ 100-25 MG PO TABS
1.0000 | ORAL_TABLET | Freq: Every day | ORAL | 1 refills | Status: DC
Start: 2022-07-26 — End: 2023-02-07
  Filled 2022-07-26 – 2022-09-27 (×2): qty 90, 90d supply, fill #0
  Filled 2022-12-05: qty 90, 90d supply, fill #1

## 2022-07-26 MED ORDER — CARVEDILOL 3.125 MG PO TABS
3.1250 mg | ORAL_TABLET | Freq: Two times a day (BID) | ORAL | 1 refills | Status: DC
Start: 2022-07-26 — End: 2023-02-07
  Filled 2022-07-26 – 2022-09-27 (×2): qty 180, 90d supply, fill #0
  Filled 2023-01-27: qty 180, 90d supply, fill #1

## 2022-07-26 NOTE — Patient Instructions (Signed)
Hypoglycemia Hypoglycemia occurs when the level of sugar (glucose) in the blood is too low. Hypoglycemia can happen in people who have or do not have diabetes. It can develop quickly, and it can be a medical emergency. For most people, a blood glucose level below 70 mg/dL (3.9 mmol/L) is considered hypoglycemia. Glucose is a type of sugar that provides the body's main source of energy. Certain hormones (insulin and glucagon) control the level of glucose in the blood. Insulin lowers blood glucose, and glucagon raises blood glucose. Hypoglycemia can result from having too much insulin in the bloodstream, or from not eating enough food that contains glucose. You may also have reactive hypoglycemia, which happens within 4 hours after eating a meal. What are the causes? Hypoglycemia occurs most often in people who have diabetes and may be caused by: Diabetes medicine. Not eating enough, or not eating often enough. Increased physical activity. Drinking alcohol on an empty stomach. If you do not have diabetes, hypoglycemia may be caused by: A tumor in the pancreas. Not eating enough, or not eating for long periods at a time (fasting). A severe infection or illness. Problems after having bariatric surgery. Organ failure, such as kidney or liver failure. Certain medicines. What increases the risk? Hypoglycemia is more likely to develop in people who: Have diabetes and take medicines to lower blood glucose. Abuse alcohol. Have a severe illness. What are the signs or symptoms? Symptoms vary depending on whether the condition is mild, moderate, or severe. Mild hypoglycemia Hunger. Sweating and feeling clammy. Dizziness or feeling light-headed. Sleepiness or restless sleep. Nausea. Increased heart rate. Headache. Blurry vision. Mood changes, such as irritability or anxiety. Tingling or numbness around the mouth, lips, or tongue. Moderate hypoglycemia Confusion and poor judgment. Behavior  changes. Weakness. Irregular heartbeat. A change in coordination. Severe hypoglycemia Severe hypoglycemia is a medical emergency. It can cause: Fainting. Seizures. Loss of consciousness (coma). Death. How is this diagnosed? Hypoglycemia is diagnosed with a blood test to measure your blood glucose level. This blood test is done while you are having symptoms. Your health care provider may also do a physical exam and review your medical history. How is this treated? This condition can be treated by immediately eating or drinking something that contains sugar with 15 grams of fast-acting carbohydrate, such as: 4 oz (120 mL) of fruit juice. 4 oz (120 mL) of regular soda (not diet soda). Several pieces of hard candy. Check food labels to find out how many pieces to eat for 15 grams. 1 Tbsp (15 mL) of sugar or honey. 4 glucose tablets. 1 tube of glucose gel. Treating hypoglycemia if you have diabetes If you are alert and able to swallow safely, follow the 15:15 rule: Take 15 grams of a fast-acting carbohydrate. Talk with your health care provider about how much you should take. Options for getting 15 grams of fast-acting carbohydrate include: Glucose tablets (take 4 tablets). Several pieces of hard candy. Check food labels to find out how many pieces to eat for 15 grams. 4 oz (120 mL) of fruit juice. 4 oz (120 mL) of regular soda (not diet soda). 1 Tbsp (15 mL) of sugar or honey. 1 tube of glucose gel. Check your blood glucose 15 minutes after you take the carbohydrate. If the repeat blood glucose level is still at or below 70 mg/dL (3.9 mmol/L), take 15 grams of a carbohydrate again. If your blood glucose level does not increase above 70 mg/dL (3.9 mmol/L) after 3 tries, seek emergency   medical care. After your blood glucose level returns to normal, eat a meal or a snack within 1 hour.  Treating severe hypoglycemia Severe hypoglycemia is when your blood glucose level is below 54 mg/dL (3  mmol/L). Severe hypoglycemia is a medical emergency. Get medical help right away. If you have severe hypoglycemia and you cannot eat or drink, you will need to be given glucagon. A family member or close friend should learn how to check your blood glucose and how to give you glucagon. Ask your health care provider if you need to have an emergency glucagon kit available. Severe hypoglycemia may need to be treated in a hospital. The treatment may include getting glucose through an IV. You may also need treatment for the cause of your hypoglycemia. Follow these instructions at home:  General instructions Take over-the-counter and prescription medicines only as told by your health care provider. Monitor your blood glucose as told by your health care provider. If you drink alcohol: Limit how much you have to: 0-1 drink a day for women who are not pregnant. 0-2 drinks a day for men. Know how much alcohol is in your drink. In the [place] one drink equals one 12 oz bottle of beer (355 mL), one 5 oz glass of wine (148 mL), or one 1 oz glass of hard liquor (44 mL). Be sure to eat food along with drinking alcohol. Be aware that alcohol is absorbed quickly and may have lingering effects that may result in hypoglycemia later. Be sure to do ongoing glucose monitoring. Keep all follow-up visits. This is important. If you have diabetes: Always have a fast-acting carbohydrate (15 grams) option with you to treat low blood glucose. Follow your diabetes management plan as directed by your health care provider. Make sure you: Know the symptoms of hypoglycemia. It is important to treat it right away to prevent it from becoming severe. Check your blood glucose as often as told. Always check before and after exercise. Always check your blood glucose before you drive a motorized vehicle. Take your medicines as told. Follow your meal plan. Eat on time, and do not skip meals. Share your diabetes management plan with  people in your workplace, school, and household. Carry a medical alert card or wear medical alert jewelry. Where to find more information American Diabetes Association: www.diabetes.org Contact a health care provider if: You have problems keeping your blood glucose in your target range. You have frequent episodes of hypoglycemia. Get help right away if: You continue to have hypoglycemia symptoms after eating or drinking something that contains 15 grams of fast-acting carbohydrate, and you cannot get your blood glucose above 70 mg/dL (3.9 mmol/L) while following the 15:15 rule. Your blood glucose is below 54 mg/dL (3 mmol/L). You have a seizure. You faint. These symptoms may represent a serious problem that is an emergency. Do not wait to see if the symptoms will go away. Get medical help right away. Call your local emergency services (911 in the [place] Do not drive yourself to the hospital. Summary Hypoglycemia occurs when the level of sugar (glucose) in the blood is too low. Hypoglycemia can happen in people who have or do not have diabetes. It can develop quickly, and it can be a medical emergency. Make sure you know the symptoms of hypoglycemia and how to treat it. Always have a fast-acting carbohydrate option with you to treat low blood sugar. This information is not intended to replace advice given to you by your health care provider.   Make sure you discuss any questions you have with your health care provider. Document Revised: 12/05/2019 Document Reviewed: 12/05/2019 [employer]vier Patient Education  2024 [employer]  

## 2022-07-26 NOTE — Progress Notes (Signed)
Subjective:  Patient ID: Adrienne [name], female    DOB: 04/17/1955  Age: 67 y.o. MRN: [id number]  CC: Diabetes   HPI Adrienne [name] is a 67 y.o. 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   Interval History: Discussed the use of AI scribe software for clinical note transcription with the patient, who gave verbal consent to proceed.  She presents with fluctuating blood sugar levels. She has been adjusting to a new injection medication, Mounjaro, and has been experiencing episodes of low blood sugar. To manage these episodes, the patient has been consuming fruit, specifically mandarins. She also reports excessive daytime sleepiness and occasional lightheadedness. She has been trying to adjust her sleep schedule, but find it challenging due to her visual impairment and feelings of loneliness. Despite these challenges, the patient has been making efforts to stay active and engaged by attending various activities and meetings.  Her A1c level has  improved significantly, dropping from 10.8 to 7.2. She is also patient is also on Lantus insulin, which she believes may be contributing to their low blood sugar episodes.  She does not exercise much. She does have stage IIIb CKD and is on a renal dose of metformin. Endorses adherence with her antihypertensive.       Past Medical History:  Diagnosis Date   Blindness of both eyes    Cataract    Diabetes (HCC)    GERD (gastroesophageal reflux disease)    Glaucoma    Heart murmur    High cholesterol    Hypertension    Stroke (HCC) 2015   R eye    Past Surgical History:  Procedure Laterality Date   CATARACT  EXTRACTION, BILATERAL     Laser eye surgery, cataract surgery bi-lat   CESAREAN SECTION     Twins, Breech   EYE SURGERY Bilateral 2012   LASER, CATARACT   TEE WITHOUT CARDIOVERSION N/A 12/31/2013   Procedure: TRANSESOPHAGEAL ECHOCARDIOGRAM (TEE);  Surgeon: Pamella Pert, MD;  Location: [facility] ENDOSCOPY;  Service: Cardiovascular;  Laterality: N/A;  H&P in file   TUBAL LIGATION Bilateral     Family History  Problem Relation Age of Onset   Dementia Mother    Diabetes Father    Hypertension Father    Hypertension Sister    Diabetes Sister    Diabetes Sister    Heart disease Sister    Colon polyps Daughter    Colon cancer Neg Hx    Esophageal cancer Neg Hx    Rectal cancer Neg Hx    Stomach cancer Neg Hx     Social History   Socioeconomic History   Marital status: Divorced    Spouse name: Not on file   Number of children: 3   Years of education: 12   Highest education level: 12th grade  Occupational History    Employer: [employer]  Tobacco Use   Smoking status: Former    Passive exposure: Past   Smokeless tobacco: Never   Tobacco comments:    QUIT IN 1987  Vaping Use   Vaping Use: Never used  Substance and Sexual Activity   Alcohol use: No    Alcohol/week: 0.0 standard drinks of alcohol  Comment: QUIT IN 1987   Drug use: No    Frequency: 2.0 times per week    Comment: QUIT 1980   Sexual activity: Not Currently  Other Topics Concern   Not on file  Social History Narrative   Patient is divorced with 3 children.   Patient is right handed.   Patient has 12 th grade education.   Patient has not been drinking caffeine.   Social Determinants of Health   Financial Resource Strain: Low Risk  (05/12/2022)   Overall Financial Resource Strain (CARDIA)    Difficulty of Paying Living Expenses: Not hard at all  Food Insecurity: No Food Insecurity (05/12/2022)   Hunger Vital Sign    Worried About Running Out of Food in the Last Year: Never true    Ran Out of  Food in the Last Year: Never true  Transportation Needs: No Transportation Needs (05/12/2022)   PRAPARE - Administrator, Civil Service (Medical): No    Lack of Transportation (Non-Medical): No  Physical Activity: Inactive (05/12/2022)   Exercise Vital Sign    Days of Exercise per Week: 0 days    Minutes of Exercise per Session: 0 min  Stress: No Stress Concern Present (05/12/2022)   [employer] of Occupational Health - Occupational Stress Questionnaire    Feeling of Stress : Not at all  Social Connections: Moderately Integrated (03/01/2021)   Social Connection and Isolation Panel [NHANES]    Frequency of Communication with Friends and Family: More than three times a week    Frequency of Social Gatherings with Friends and Family: More than three times a week    Attends Religious Services: More than 4 times per year    Active Member of [employer] or Organizations: Yes    Attends Engineer, structural: More than 4 times per year    Marital Status: Divorced    Allergies  Allergen Reactions   Codeine Itching   Hydrocodone Itching   Oxycodone Itching   Penicillins Itching   Clindamycin/Lincomycin Itching   Niacin And Related Itching   Penicillin G Itching   Niacin Itching and Anxiety    Facial flushing    Outpatient Medications Prior to Visit  Medication Sig Dispense Refill   acetaminophen (TYLENOL) 500 MG tablet Take 500 mg by mouth every 6 (six) hours as needed for moderate pain.     aspirin EC 81 MG tablet Take 81 mg by mouth daily.      cetirizine (ZYRTEC) 10 MG tablet Take 1 tablet (10 mg total) by mouth daily. 30 tablet 1   cholecalciferol (VITAMIN D3) 25 MCG (1000 UNIT) tablet Take 1 tablet (1,000 Units total) by mouth daily. 30 tablet 3   Continuous Glucose Receiver (FREESTYLE LIBRE 3 READER) DEVI 1 each by Does not apply route 3 (three) times daily. Please use to check blood sugar three times daily. E11.69 1 each 0   Continuous Glucose Sensor (FREESTYLE  LIBRE 3 SENSOR) MISC Please use to check blood sugar three times daily. Change sensors once every 14 days. 2 each 6   diclofenac Sodium (VOLTAREN) 1 % GEL Apply 4 g topically 4 (four) times daily. 100 g 1   famotidine (PEPCID) 20 MG tablet Take 1 tablet (20 mg total) by mouth 2 (two) times daily. 180 tablet 1   fluticasone (FLONASE) 50 MCG/ACT nasal spray Place 2 sprays into both nostrils daily. 16 g 2   guaiFENesin-dextromethorphan (ROBITUSSIN DM) 100-10 MG/5ML syrup Take 5 mLs by mouth every  6 (six) hours as needed for cough. 118 mL 0   Insulin Pen Needle 31G X 5 MM MISC use as directed 4 (four) times daily - after meals and at bedtime. 100 each 3   metFORMIN (GLUCOPHAGE) 500 MG tablet Take 1 tablet (500 mg total) by mouth 2 (two) times daily with a meal. 180 tablet 1   metoCLOPramide (REGLAN) 5 MG tablet Take 1 tablet (5 mg total) by mouth every 8 (eight) hours as needed for nausea. 90 tablet 1   Misc. Devices MISC Blood pressure monitor. Dx: Hypertension 1 each 0   atorvastatin (LIPITOR) 80 MG tablet Take 1 tablet (80 mg total) by mouth daily. 90 tablet 1   carvedilol (COREG) 3.125 MG tablet Take 1 tablet (3.125 mg total) by mouth 2 (two) times daily with a meal. 180 tablet 1   clopidogrel (PLAVIX) 75 MG tablet Take 1 tablet (75 mg total) by mouth daily. 90 tablet 1   insulin glargine (LANTUS) 100 UNIT/ML Solostar Pen Inject 35 Units into the skin in the morning AND 22 Units every evening. 30 mL 6   losartan-hydrochlorothiazide (HYZAAR) 100-25 MG tablet Take 1 tablet by mouth daily. 90 tablet 1   predniSONE (DELTASONE) 20 MG tablet Take 1 tablet (20 mg total) by mouth daily with breakfast. 5 tablet 0   tirzepatide (MOUNJARO) 2.5 MG/0.5ML Pen Inject 2.5 mg into the skin once a week. Then increase to 5 mg once a week 2 mL 0   tirzepatide (MOUNJARO) 5 MG/0.5ML Pen Inject 5 mg into the skin once a week. (Patient not taking: Reported on 05/12/2022) 6 mL 2   No facility-administered medications prior  to visit.     ROS Review of Systems  Constitutional:  Negative for activity change and appetite change.  HENT:  Negative for sinus pressure and sore throat.   Eyes:  Positive for visual disturbance.  Respiratory:  Negative for chest tightness, shortness of breath and wheezing.   Cardiovascular:  Negative for chest pain and palpitations.  Gastrointestinal:  Negative for abdominal distention, abdominal pain and constipation.  Genitourinary: Negative.   Musculoskeletal: Negative.   Psychiatric/Behavioral:  Negative for behavioral problems and dysphoric mood.     Objective:  BP 135/78   Pulse 84   Temp 98.7 F (37.1 C)   Wt 199 lb 6.4 oz (90.4 kg)   BMI 32.18 kg/m      07/26/2022    2:59 PM 05/12/2022   12:26 PM 05/02/2022    3:10 PM  BP/Weight  Systolic BP 135  161  Diastolic BP 78  78  Wt. (Lbs) 199.4 200   BMI 32.18 kg/m2 32.28 kg/m2       Physical Exam Constitutional:      Appearance: She is well-developed.  Cardiovascular:     Rate and Rhythm: Normal rate.     Heart sounds: Normal heart sounds. No murmur heard. Pulmonary:     Effort: Pulmonary effort is normal.     Breath sounds: Normal breath sounds. No wheezing or rales.  Chest:     Chest wall: No tenderness.  Abdominal:     General: Bowel sounds are normal. There is no distension.     Palpations: Abdomen is soft. There is no mass.     Tenderness: There is no abdominal tenderness.  Musculoskeletal:        General: Normal range of motion.     Right lower leg: No edema.     Left lower leg: No edema.  Neurological:  Mental Status: She is alert and oriented to person, place, and time.  Psychiatric:        Mood and Affect: Mood normal.        Latest Ref Rng & Units 05/02/2022    3:28 PM 08/02/2021    4:00 PM 04/20/2021    4:07 PM  CMP  Glucose 70 - 99 mg/dL 161  096  045   BUN 8 - 27 mg/dL 39  25  35   Creatinine 0.57 - 1.00 mg/dL 4.09  8.11  9.14   Sodium 134 - 144 mmol/L 136  138  139   Potassium  3.5 - 5.2 mmol/L 4.6  4.6  4.2   Chloride 96 - 106 mmol/L 97  100  100   CO2 20 - 29 mmol/L 20  22  22    Calcium 8.7 - 10.3 mg/dL 78.2  9.4  95.6   Total Protein 6.0 - 8.5 g/dL 6.9  6.9  6.8   Total Bilirubin 0.0 - 1.2 mg/dL 0.3  <2.1  0.3   Alkaline Phos 44 - 121 IU/L 120  105  101   AST 0 - 40 IU/L 14  15  16    ALT 0 - 32 IU/L 20  19  17      Lipid Panel     Component Value Date/Time   CHOL 202 (H) 05/07/2019 1049   TRIG 122 05/07/2019 1049   HDL 46 05/07/2019 1049   CHOLHDL 4.4 05/07/2019 1049   CHOLHDL 2.7 01/04/2016 1138   VLDL 12 01/04/2016 1138   LDLCALC 134 (H) 05/07/2019 1049    CBC    Component Value Date/Time   WBC 9.7 10/19/2020 1649   WBC 9.6 02/13/2019 2030   RBC 3.71 (L) 10/19/2020 1649   RBC 3.14 (L) 02/13/2019 2030   HGB 11.5 10/19/2020 1649   HCT 33.4 (L) 10/19/2020 1649   PLT 508 (H) 10/19/2020 1649   MCV 90 10/19/2020 1649   MCH 31.0 10/19/2020 1649   MCH 30.6 02/13/2019 2030   MCHC 34.4 10/19/2020 1649   MCHC 32.9 02/13/2019 2030   RDW 11.8 10/19/2020 1649   LYMPHSABS 2.7 10/19/2020 1649   MONOABS 0.4 02/13/2019 2030   EOSABS 0.3 10/19/2020 1649   BASOSABS 0.1 10/19/2020 1649    Lab Results  Component Value Date   HGBA1C 7.2 (A) 07/26/2022    Assessment & Plan:      Type 2 Diabetes Mellitus:  Improved glycemic control with A1c of 7.2, down from 10.8. Patient reports occasional hypoglycemic episodes, likely secondary to Lantus. Currently on Mounjaro 5mg  and Lantus 30 units in the morning and 22 units at night. -Decrease Lantus to 28 units in the morning and 20 units at night. -Plan to increase Mounjaro to 7.5mg  once current supply is finished. -If hypoglycemic episodes continue, decrease Lantus by an additional 2 units in the morning and evening. -Clinical pharmacist called into check her CGM and provided education to the patient and her daughter. -Check A1c in 3 months.  Depression/Loneliness: Patient reports feelings of loneliness and  occasional low mood, but denies depression. Patient has some social activities and support. -Encourage continued social activities and utilization of available resources.  Hypertension: Controlled -Continue antihypertensives -Counseled on blood pressure goal of less than 130/80, low-sodium, DASH diet, medication compliance, 150 minutes of moderate intensity exercise per week. Discussed medication compliance, adverse effects.  Stage III CKD: Will check renal function today If creatinine is less than 30, I will discontinue metformin  General Health Maintenance: -Continue foot care and regular foot exams due to diabetes. -Attempt to increase daytime activity to improve sleep pattern.           Meds ordered this encounter  Medications   insulin glargine (LANTUS) 100 UNIT/ML Solostar Pen    Sig: Inject 28 Units into the skin in the morning AND 20 Units every evening.    Dispense:  30 mL    Refill:  6   tirzepatide (MOUNJARO) 7.5 MG/0.5ML Pen    Sig: Inject 7.5 mg into the skin once a week. For 4 weeks then increase to 10 mg once a week    Dispense:  6 mL    Refill:  0   atorvastatin (LIPITOR) 80 MG tablet    Sig: Take 1 tablet (80 mg total) by mouth daily.    Dispense:  90 tablet    Refill:  1   carvedilol (COREG) 3.125 MG tablet    Sig: Take 1 tablet (3.125 mg total) by mouth 2 (two) times daily with a meal.    Dispense:  180 tablet    Refill:  1   clopidogrel (PLAVIX) 75 MG tablet    Sig: Take 1 tablet (75 mg total) by mouth daily.    Dispense:  90 tablet    Refill:  1   losartan-hydrochlorothiazide (HYZAAR) 100-25 MG tablet    Sig: Take 1 tablet by mouth daily.    Dispense:  90 tablet    Refill:  1    Follow-up: Return in about 3 months (around 10/26/2022) for Chronic medical conditions.       Hoy Register, MD, FAAFP. [facility] [place], [place] [phone]   07/26/2022, 4:25 PM

## 2022-07-27 ENCOUNTER — Other Ambulatory Visit: Payer: Self-pay | Admitting: Family Medicine

## 2022-07-27 DIAGNOSIS — Z794 Long term (current) use of insulin: Secondary | ICD-10-CM

## 2022-07-27 LAB — CMP14+EGFR
ALT: 14 IU/L (ref 0–32)
AST: 17 IU/L (ref 0–40)
Albumin: 4.4 g/dL (ref 3.9–4.9)
Alkaline Phosphatase: 98 IU/L (ref 44–121)
BUN/Creatinine Ratio: 15 (ref 12–28)
BUN: 32 mg/dL — ABNORMAL HIGH (ref 8–27)
Bilirubin Total: 0.2 mg/dL (ref 0.0–1.2)
CO2: 22 mmol/L (ref 20–29)
Calcium: 9.7 mg/dL (ref 8.7–10.3)
Chloride: 104 mmol/L (ref 96–106)
Creatinine, Ser: 2.2 mg/dL — ABNORMAL HIGH (ref 0.57–1.00)
Globulin, Total: 2.9 g/dL (ref 1.5–4.5)
Glucose: 86 mg/dL (ref 70–99)
Potassium: 4.1 mmol/L (ref 3.5–5.2)
Sodium: 142 mmol/L (ref 134–144)
Total Protein: 7.3 g/dL (ref 6.0–8.5)
eGFR: 24 mL/min/{1.73_m2} — ABNORMAL LOW (ref >59)

## 2022-07-27 MED ORDER — INSULIN GLARGINE 100 UNIT/ML SOLOSTAR PEN: PEN_INJECTOR | SUBCUTANEOUS | 6 refills | Status: DC

## 2022-07-28 ENCOUNTER — Ambulatory Visit: Payer: Self-pay

## 2022-07-28 NOTE — Patient Outreach (Signed)
  Care Coordination   07/28/2022 Name: Adrienne [name] MRN: [id number] DOB: 12/10/1955   Care Coordination Outreach Attempts:  An unsuccessful telephone outreach was attempted for a scheduled appointment today.  Follow Up Plan:  Additional outreach attempts will be made to offer the patient care coordination information and services.   Encounter Outcome:  No Answer   Care Coordination Interventions:  No, not indicated    Delsa Sale, RN, BSN, CCM Care Management Coordinator [facility] Direct Phone: [phone]

## 2022-08-01 ENCOUNTER — Other Ambulatory Visit (HOSPITAL_COMMUNITY): Payer: Self-pay

## 2022-08-01 ENCOUNTER — Telehealth: Payer: Self-pay | Admitting: Family Medicine

## 2022-08-01 NOTE — Telephone Encounter (Signed)
Patient request transfer to [facility].

## 2022-08-01 NOTE — Telephone Encounter (Signed)
Patient would like rx for Insulin Pen Needle 31G X 5 MM MISC to be transferred to  [facility] Phone: [phone]  Fax: [phone]

## 2022-08-09 ENCOUNTER — Ambulatory Visit: Payer: Self-pay

## 2022-08-09 NOTE — Patient Outreach (Signed)
  Care Coordination   Follow Up Visit Note   08/09/2022 Name: Adrienne [name] MRN: [id number] DOB: March 21, 1955  Adrienne [name] is a 67 y.o. year old female who sees Hoy Register, MD for primary care. I spoke with  Adrienne [name] by phone today.  What matters to the patients health and wellness today?  Assistance with OTC order    Goals Addressed             This Visit's Progress    Care Coordination Activities   On track    Care Coordination Interventions: Assisted patient with ordering over the counter supplies Assessed for acute care coordination needs - none identified Scheduled call to assist patient with another order in September Encouraged the patient to contact SW as needed prior to next scheduled call         SDOH assessments and interventions completed:  No     Care Coordination Interventions:  Yes, provided   Interventions Today    Flowsheet Row Most Recent Value  Chronic Disease   Chronic disease during today's visit Diabetes, Hypertension (HTN)  General Interventions   General Interventions Discussed/Reviewed General Interventions Reviewed        Follow up plan: Follow up call scheduled for 09/26/22    Encounter Outcome:  Pt. Visit Completed   Bevelyn Ngo, Kenard Gower, CDP Social Worker, Certified Dementia Practitioner [facility] Care Management  Care Coordination [phone]

## 2022-08-09 NOTE — Patient Instructions (Signed)
Visit Information  Thank you for taking time to visit with me today. Please don't hesitate to contact me if I can be of assistance to you.   Following are the goals we discussed today:  - Your OTC order will arrive in the next 3-5 business days - Contact your primary care provider as needed  Our next appointment is by telephone on 9/9 at 2:45 pm  Please call the care guide team at [phone] if you need to cancel or reschedule your appointment.   If you are experiencing a Mental Health or Behavioral Health Crisis or need someone to talk to, please call the Suicide and Crisis Lifeline: 988 go to [facility] [address], [place] [phone]) call 911  The patient verbalized understanding of instructions, educational materials, and care plan provided today and DECLINED offer to receive copy of patient instructions, educational materials, and care plan.   Bevelyn Ngo, BSW, CDP Social Worker, Certified Dementia Practitioner [facility] Care Management  Care Coordination [phone]

## 2022-08-12 ENCOUNTER — Other Ambulatory Visit: Payer: Self-pay

## 2022-08-12 ENCOUNTER — Other Ambulatory Visit (HOSPITAL_COMMUNITY): Payer: Self-pay

## 2022-08-18 ENCOUNTER — Ambulatory Visit: Payer: Self-pay

## 2022-08-18 NOTE — Patient Instructions (Signed)
Visit Information  Thank you for taking time to visit with me today. Please don't hesitate to contact me if I can be of assistance to you.   Following are the goals we discussed today:   Goals Addressed               This Visit's Progress     Patient Stated     I want to improve my diabetes (pt-stated)        Care Coordination Interventions: Evaluation of current treatment plan related to type 2 diabetes and patient's adherence to plan as established by provider Review of patient status, including review of consultants reports, relevant laboratory and other test results, and medications completed Advised patient, providing education and rationale, to check cbg  and record, calling  for findings outside established parameters Counseled on Diabetic diet, my plate method, 147 minutes of moderate intensity exercise/week Referral sent to Jenel Lucks LCSW for counseling and support for depression; check for resources for exercise equipment Reviewed scheduled/upcoming provider appointments including: PCP follow up with Dr. Alvis Lemmings scheduled for 11/01/22 @3 :10 PM Assessed patient's understanding of A1c goal: <7% Lab Results  Component Value Date   HGBA1C 7.2 (A) 07/26/2022           Other     To improve kidney function        Care Coordination Interventions: Assessed the Patient understanding of chronic kidney disease    Evaluation of current treatment plan related to chronic kidney disease self management and patient's adherence to plan as established by provider    Review of patient status, including review of consultant's reports, relevant laboratory and other test results, and medications completed   Re-educated patient regarding the importance of increasing daily water intake to 48-64 oz unless otherwise directed  Discussed complications of poorly controlled blood pressure and or uncontrolled diabetes, such as kidney impairment   Provided education on kidney disease progression    Last practice recorded BP readings:  BP Readings from Last 3 Encounters:  07/26/22 135/78  05/02/22 (!) 160/78  03/09/22 126/71   Most recent eGFR/CrCl:  Lab Results  Component Value Date   EGFR 24 (L) 07/26/2022    No components found for: "CRCL"           Our next appointment is by telephone on 11/03/22 at 12:30 PM  Please call the care guide team at [phone] if you need to cancel or reschedule your appointment.   If you are experiencing a Mental Health or Behavioral Health Crisis or need someone to talk to, please call 1-800-273-TALK (toll free, 24 hour hotline)  The patient verbalized understanding of instructions, educational materials, and care plan provided today and DECLINED offer to receive copy of patient instructions, educational materials, and care plan.   Delsa Sale, RN, BSN, CCM Care Management Coordinator [facility] Care Management Direct Phone: [phone]

## 2022-08-18 NOTE — Patient Outreach (Signed)
Care Coordination   Follow Up Visit Note   08/18/2022 Name: Adrienne [name] MRN: [id number] DOB: [birth year]  Adrienne [name] is a 67 y.o. year old female who sees Hoy Register, MD for primary care. I spoke with  Adrienne [name] by phone today.  What matters to the patients health and wellness today?  Patient would like to establish an exercise routine. She will continue to increase her daily water intake.     Goals Addressed               This Visit's Progress     Patient Stated     I want to improve my diabetes (pt-stated)        Care Coordination Interventions: Evaluation of current treatment plan related to type 2 diabetes and patient's adherence to plan as established by provider Review of patient status, including review of consultants reports, relevant laboratory and other test results, and medications completed Advised patient, providing education and rationale, to check cbg  and record, calling  for findings outside established parameters Counseled on Diabetic diet, my plate method, 063 minutes of moderate intensity exercise/week Referral sent to Jenel Lucks LCSW for counseling and support for depression; check for resources for exercise equipment Reviewed scheduled/upcoming provider appointments including: PCP follow up with Dr. Alvis Lemmings scheduled for 11/01/22 @3 :10 PM Assessed patient's understanding of A1c goal: <7% Lab Results  Component Value Date   HGBA1C 7.2 (A) 07/26/2022       Other     To improve kidney function        Care Coordination Interventions: Assessed the Patient understanding of chronic kidney disease    Evaluation of current treatment plan related to chronic kidney disease self management and patient's adherence to plan as established by provider    Review of patient status, including review of consultant's reports, relevant laboratory and other test results, and medications completed   Re-educated patient regarding the importance of  increasing daily water intake to 48-64 oz unless otherwise directed  Discussed complications of poorly controlled blood pressure and or uncontrolled diabetes, such as kidney impairment   Provided education on kidney disease progression   Last practice recorded BP readings:  BP Readings from Last 3 Encounters:  07/26/22 135/78  05/02/22 (!) 160/78  03/09/22 126/71   Most recent eGFR/CrCl:  Lab Results  Component Value Date   EGFR 24 (L) 07/26/2022    No components found for: "CRCL"    Interventions Today    Flowsheet Row Most Recent Value  Chronic Disease   Chronic disease during today's visit Diabetes  General Interventions   General Interventions Discussed/Reviewed General Interventions Discussed, General Interventions Reviewed, Doctor Visits, Communication with  Doctor Visits Discussed/Reviewed Doctor Visits Discussed, Doctor Visits Reviewed, PCP  Communication with Social Work  Jenel Lucks LCSW]  Exercise Interventions   Exercise Discussed/Reviewed Exercise Discussed, Exercise Reviewed, Physical Activity  Physical Activity Discussed/Reviewed Physical Activity Discussed, Physical Activity Reviewed, Types of exercise  Education Interventions   Education Provided Provided Education  Provided Verbal Education On When to see the doctor, Nutrition, Medication, Exercise, Mental Health/Coping with Illness, Blood Sugar Monitoring  Mental Health Interventions   Mental Health Discussed/Reviewed Mental Health Discussed, Mental Health Reviewed, Coping Strategies, Depression, Refer to Social Work for resources, Refer to Social Work for counseling  Refer to Social Work for counseling regarding Depression  Nutrition Interventions   Nutrition Discussed/Reviewed Nutrition Reviewed, Nutrition Discussed  Pharmacy Interventions   Pharmacy Dicussed/Reviewed Pharmacy Topics Reviewed, Pharmacy Topics Discussed, Medications  and their functions          SDOH assessments and interventions  completed:  No     Care Coordination Interventions:  Yes, provided   Follow up plan: Follow up call scheduled for 11/03/22 @12 :30 PM    Encounter Outcome:  Pt. Visit Completed

## 2022-08-29 ENCOUNTER — Telehealth: Payer: Self-pay

## 2022-08-29 ENCOUNTER — Telehealth: Payer: Self-pay | Admitting: Licensed Clinical Social Worker

## 2022-08-29 ENCOUNTER — Ambulatory Visit: Payer: Self-pay | Attending: Licensed Clinical Social Worker | Admitting: Licensed Clinical Social Worker

## 2022-08-29 DIAGNOSIS — F4321 Adjustment disorder with depressed mood: Secondary | ICD-10-CM

## 2022-08-29 NOTE — Telephone Encounter (Signed)
Pt is needing assistance.

## 2022-08-29 NOTE — BH Specialist Note (Unsigned)
Integrated Behavioral Health via Telemedicine Visit  08/29/2022 Adrienne [name] [id number]  Number of Integrated Behavioral Health Clinician visits: 1- Initial Visit  Session Start time: 1528   Session End time: 1558  Total time in minutes: 30   Referring Provider: Alvis Lemmings Patient/Family location: Home [facility] Provider location: [facility] All persons participating in visit: Patient and LCSWA Types of Service: Telephone visit and Introduction only  I connected with [name] and/or via  Telephone or Video Enabled Telemedicine Application  (Video is Caregility application) and verified that I am speaking with the correct person using two identifiers. Discussed confidentiality: Yes   I discussed the limitations of telemedicine and the availability of in person appointments.  Discussed there is a possibility of technology failure and discussed alternative modes of communication if that failure occurs.  I discussed that engaging in this telemedicine visit, they consent to the provision of behavioral healthcare and the services will be billed under their insurance.  Patient and/or legal guardian expressed understanding and consented to Telemedicine visit: Yes   Presenting Concerns: Patient and/or family reports the following symptoms/concerns: feeling sad often and wanted to shutout the world  Duration of problem: past few weeks; Severity of problem: mild  Patient and/or Family's Strengths/Protective Factors: Concrete supports in place (healthy food, safe environments, etc.)  Goals Addressed: Patient will:  Reduce symptoms of: depression   Increase knowledge and/or ability of: coping skills   Demonstrate ability to: Increase healthy adjustment to current life circumstances  Progress towards Goals: Ongoing  Interventions: Interventions utilized:  Supportive Counseling and Link to [employer] Standardized Assessments completed: Not Needed  Patient and/or Family Response: Patient just needed someone to talk to. Patient shared that she is blind and has been since about 2015, patient shared her relationship between her and her children and she states that it is not the best. Patient shared that she often feel sad, lonely and helpless, "all she sees is darkness, so often times she goes to sleep to escape". A few places she mentioned that she likes to spend her time is with [employer] Whe[employer], [facility] people and disability advocate center.  Assessment: Patient currently experiencing sadness, loneliness, helpless (patient shared all she sees is darkness) and over sleeping   Patient may benefit from Most important things is getting her some assistant with doing things around her home an continuing to stay involved in the support groups and disability advocate center..  Plan: Follow up with behavioral health clinician on : 4 weeks Behavioral recommendations: Most important things is getting her some assistant with doing things around her home an continuing to stay involved in the support groups and disability advocate center. Referral(s): Integrated Art gallery manager (In Clinic) and [employer]:  home assistance with [facility], to keep her company, assist with tidying up her home.  I discussed the assessment and treatment plan with the patient and/or parent/guardian. They were provided an opportunity to ask questions and all were answered. They agreed with the plan and demonstrated an understanding of the instructions.   They were advised to call back or seek an in-person evaluation if the symptoms worsen or if the condition fails to improve as anticipated.  Vassie Loll, LCSWA

## 2022-08-29 NOTE — Telephone Encounter (Signed)
Copied from CRM [id number]. Topic: General - Inquiry >> Aug 29, 2022 11:27 AM Haroldine Laws wrote: Reason for CRM: pt called asking about someone talking to her about mental health.  She discussed this on 8/1 with a nurse and thought someone was getting in touch with her  CB@  [phone]

## 2022-08-29 NOTE — Telephone Encounter (Signed)
Called patient twice to address her mental health concerns. Patient did not answer. I was not able to leave a message, bc her mailbox was full. Please let me if she calls back.

## 2022-08-30 ENCOUNTER — Telehealth: Payer: Self-pay

## 2022-08-30 NOTE — Telephone Encounter (Signed)
PCS form faxed to [place] [facility]

## 2022-08-31 DIAGNOSIS — F4321 Adjustment disorder with depressed mood: Secondary | ICD-10-CM | POA: Insufficient documentation

## 2022-09-05 ENCOUNTER — Other Ambulatory Visit: Payer: Self-pay | Admitting: Family Medicine

## 2022-09-05 ENCOUNTER — Other Ambulatory Visit (HOSPITAL_COMMUNITY): Payer: Self-pay

## 2022-09-05 DIAGNOSIS — Z794 Long term (current) use of insulin: Secondary | ICD-10-CM

## 2022-09-06 ENCOUNTER — Other Ambulatory Visit (HOSPITAL_COMMUNITY): Payer: Self-pay

## 2022-09-06 MED ORDER — LANTUS SOLOSTAR 100 UNIT/ML ~~LOC~~ SOPN
PEN_INJECTOR | SUBCUTANEOUS | 6 refills | Status: DC
Start: 2022-09-06 — End: 2022-11-01
  Filled 2022-09-06: qty 30, 53d supply, fill #0

## 2022-09-06 NOTE — Telephone Encounter (Signed)
Requested Prescriptions  Pending Prescriptions Disp Refills   insulin glargine (LANTUS SOLOSTAR) 100 UNIT/ML Solostar Pen 30 mL 6    Sig: Inject 35 Units into the skin in the morning AND 22 Units every evening.     Endocrinology:  Diabetes - Insulins Passed - 09/05/2022  8:51 AM      Passed - HBA1C is between 0 and 7.9 and within 180 days    Hemoglobin A1C  Date Value Ref Range Status  07/26/2022 7.2 (A) 4.0 - 5.6 % Final   HbA1c, POC (controlled diabetic range)  Date Value Ref Range Status  05/02/2022 10.8 (A) 0.0 - 7.0 % Final         Passed - Valid encounter within last 6 months    Recent Outpatient Visits           1 month ago Type 2 diabetes mellitus with other specified complication, with long-term current use of insulin (HCC)   [facility] [place], [place], MD   3 months ago Type 2 diabetes mellitus with other specified complication, with long-term current use of insulin (HCC)   [place] [facility] [place], [place], MD   4 months ago Type 2 diabetes mellitus with other specified complication, with long-term current use of insulin (HCC)   [place] [facility] [place], [place], MD   7 months ago Type 2 diabetes mellitus with other specified complication, with long-term current use of insulin (HCC)   [facility] [place], [place], MD   9 months ago Type 2 diabetes mellitus with other specified complication, with long-term current use of insulin [facility])   [place] [facility] Hoy Register, MD       Future Appointments             In 1 month Hoy Register, MD [facility]

## 2022-09-22 ENCOUNTER — Other Ambulatory Visit: Payer: Self-pay

## 2022-09-22 ENCOUNTER — Other Ambulatory Visit: Payer: Self-pay | Admitting: Family Medicine

## 2022-09-22 MED ORDER — TIRZEPATIDE 7.5 MG/0.5ML ~~LOC~~ SOAJ
7.5000 mg | SUBCUTANEOUS | 0 refills | Status: DC
  Filled 2022-09-22: qty 2, 28d supply, fill #0
  Filled 2022-10-11: qty 2, 28d supply, fill #1
  Filled 2022-11-08: qty 2, 28d supply, fill #2

## 2022-09-26 ENCOUNTER — Telehealth: Payer: Self-pay

## 2022-09-26 NOTE — Patient Outreach (Signed)
  Care Coordination   09/26/2022 Name: MATTYE RECZEK MRN: [id number] DOB: 17-Jul-1955   Care Coordination Outreach Attempts:  An unsuccessful telephone outreach was attempted for a scheduled appointment today.  Follow Up Plan:  Additional outreach attempts will be made to offer the patient care coordination information and services.   Encounter Outcome:  No Answer   Care Coordination Interventions:  No, not indicated    Bevelyn Ngo, BSW, CDP Social Worker, Certified Dementia Practitioner [facility] Care Management  Care Coordination [phone]

## 2022-09-27 ENCOUNTER — Other Ambulatory Visit: Payer: Self-pay | Admitting: Family Medicine

## 2022-09-27 ENCOUNTER — Other Ambulatory Visit (HOSPITAL_COMMUNITY): Payer: Self-pay

## 2022-09-27 MED ORDER — BD PEN NEEDLE MINI U/F 31G X 5 MM MISC
1.0000 | Freq: Three times a day (TID) | 3 refills | Status: AC
  Filled 2022-09-27: qty 100, 25d supply, fill #0
  Filled 2023-01-13: qty 100, 25d supply, fill #1
  Filled 2023-03-15: qty 100, 25d supply, fill #2
  Filled 2023-04-12 – 2023-07-10 (×2): qty 100, 25d supply, fill #3

## 2022-09-28 ENCOUNTER — Other Ambulatory Visit: Payer: Self-pay

## 2022-09-29 ENCOUNTER — Other Ambulatory Visit: Payer: Self-pay

## 2022-09-29 ENCOUNTER — Other Ambulatory Visit (HOSPITAL_COMMUNITY): Payer: Self-pay

## 2022-09-30 ENCOUNTER — Telehealth: Payer: Self-pay

## 2022-09-30 NOTE — Patient Outreach (Signed)
Care Coordination   09/30/2022 Name: Adrienne [name] MRN: [id number] DOB: [birth year]   Care Coordination Outreach Attempts:  A second unsuccessful outreach was attempted today to offer the patient with information about available care coordination services.  Follow Up Plan:  Additional outreach attempts will be made to offer the patient care coordination information and services.   Encounter Outcome:  No Answer   Care Coordination Interventions:  No, not indicated    Bevelyn Ngo, BSW, CDP [place]  [facility], [facility] Social Worker Direct Dial: [phone]  Fax: [phone]

## 2022-10-10 ENCOUNTER — Telehealth: Payer: Self-pay

## 2022-10-10 ENCOUNTER — Telehealth: Payer: Self-pay | Admitting: Family Medicine

## 2022-10-10 NOTE — Patient Outreach (Signed)
Care Coordination   10/10/2022 Name: Adrienne [name] MRN: [id number] DOB: 1955/07/18   Care Coordination Outreach Attempts:  A third unsuccessful outreach was attempted today to offer the patient with information about available care coordination services.  Follow Up Plan:  No further outreach attempts will be made at this time. We have been unable to contact the patient to offer or enroll patient in care coordination services  Encounter Outcome:  No Answer   Care Coordination Interventions:  No, not indicated    Bevelyn Ngo, BSW, CDP [place]  [facility], [facility] Social Worker Direct Dial: [phone]  Fax: [phone]

## 2022-10-10 NOTE — Telephone Encounter (Signed)
Copied from CRM [id number]. Topic: General - Other >> Oct 10, 2022  3:12 PM Santiya F wrote: Reason for CRM: Pt is calling in requesting the social worker give her a call. Pt says a Child psychotherapist usually calls her every month and she hasn't heard from anyone.

## 2022-10-11 ENCOUNTER — Other Ambulatory Visit (HOSPITAL_COMMUNITY): Payer: Self-pay

## 2022-10-12 ENCOUNTER — Ambulatory Visit: Payer: Self-pay | Admitting: Licensed Clinical Social Worker

## 2022-10-12 NOTE — Telephone Encounter (Signed)
Patient is clinical therapist playing phone tag today.  Patient stated that she usually receives a phone call from me monthly but had me confused with another clinical social worker Jenel Lucks.  When asked if she wanted me to forward this message to [facility] she said no.  Patient stated that she was charged for her last visit with clinical therapist at [facility].  Clinical therapist stated she will share information with her supervisor to see if we can get more information about the bill.

## 2022-10-28 ENCOUNTER — Other Ambulatory Visit (HOSPITAL_COMMUNITY): Payer: Self-pay

## 2022-10-28 ENCOUNTER — Other Ambulatory Visit: Payer: Self-pay

## 2022-10-31 ENCOUNTER — Other Ambulatory Visit: Payer: Self-pay

## 2022-11-01 ENCOUNTER — Ambulatory Visit: Payer: Medicare HMO | Attending: Family Medicine | Admitting: Family Medicine

## 2022-11-01 ENCOUNTER — Encounter: Payer: Self-pay | Admitting: Family Medicine

## 2022-11-01 ENCOUNTER — Other Ambulatory Visit: Payer: Self-pay

## 2022-11-01 ENCOUNTER — Other Ambulatory Visit (HOSPITAL_COMMUNITY): Payer: Self-pay

## 2022-11-01 VITALS — BP 115/76 | HR 84 | Ht 66.0 in | Wt 197.0 lb

## 2022-11-01 DIAGNOSIS — E1169 Type 2 diabetes mellitus with other specified complication: Secondary | ICD-10-CM

## 2022-11-01 DIAGNOSIS — K219 Gastro-esophageal reflux disease without esophagitis: Secondary | ICD-10-CM | POA: Diagnosis not present

## 2022-11-01 DIAGNOSIS — Z23 Encounter for immunization: Secondary | ICD-10-CM

## 2022-11-01 DIAGNOSIS — Z794 Long term (current) use of insulin: Secondary | ICD-10-CM

## 2022-11-01 DIAGNOSIS — K5903 Drug induced constipation: Secondary | ICD-10-CM | POA: Diagnosis not present

## 2022-11-01 DIAGNOSIS — E1122 Type 2 diabetes mellitus with diabetic chronic kidney disease: Secondary | ICD-10-CM

## 2022-11-01 DIAGNOSIS — E785 Hyperlipidemia, unspecified: Secondary | ICD-10-CM

## 2022-11-01 DIAGNOSIS — I129 Hypertensive chronic kidney disease with stage 1 through stage 4 chronic kidney disease, or unspecified chronic kidney disease: Secondary | ICD-10-CM | POA: Diagnosis not present

## 2022-11-01 DIAGNOSIS — R400 Somnolence: Secondary | ICD-10-CM

## 2022-11-01 LAB — POCT GLYCOSYLATED HEMOGLOBIN (HGB A1C): HbA1c, POC (controlled diabetic range): 9.4 % — AB (ref 0.0–7.0)

## 2022-11-01 MED ORDER — FAMOTIDINE 20 MG PO TABS
20.0000 mg | ORAL_TABLET | Freq: Two times a day (BID) | ORAL | 1 refills | Status: DC
  Filled 2022-11-01 – 2023-01-27 (×3): qty 180, 90d supply, fill #0

## 2022-11-01 NOTE — Progress Notes (Unsigned)
Subjective:  Patient ID: Adrienne [name], female    DOB: 1955/09/16  Age: 67 y.o. MRN: [id number]  CC: Medical Management of Chronic Issues (Sleeping too much/Mounjaro causing constipation)   HPI Adrienne [name] is a 67 y.o. 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 Accompanied by daughter to this visit.  Interval History: Discussed the use of AI scribe software for clinical note transcription with the patient, who gave verbal consent to proceed.  History of Present Illness   The patient, with a history of diabetes, presents with disrupted sleep patterns and excessive daytime sleepiness. She reports waking up around 1:30 or 2:00 AM and staying awake until 7:00 or 9:00 AM, sometimes as late as 11:00 AM. This has led to her sleeping for the rest of the day. She expresses a desire to manage her sleep issues naturally, without medication.  In addition to sleep issues, the patient has been experiencing constipation, which she attributes to her medication, Mounjaro, and a lack of fiber in her diet. She has chosen to continue taking Mounjaro and manage the constipation separately.  The patient's blood sugar levels have been a concern.  A1c is 9.4 from 7.2 previously, which she believes is due to her consumption of candy, specifically turtles, to prevent low blood sugar episodes. She reports that her blood sugar levels have been in the hundreds, with a recent reading of 137 after eating. She expresses a willingness to replace the candy with fruit to manage her blood sugar levels.  The patient also mentions a history of kidney issues and has been referred to a nephrologist. She has not yet scheduled an appointment but agrees to do  so.        Past Medical History:  Diagnosis Date   Blindness of both eyes    Cataract    Diabetes (HCC)    GERD (gastroesophageal reflux disease)    Glaucoma    Heart murmur    High cholesterol    Hypertension    Stroke (HCC) 2015   R eye    Past Surgical History:  Procedure Laterality Date   CATARACT EXTRACTION, BILATERAL     Laser eye surgery, cataract surgery bi-lat   CESAREAN SECTION     Twins, Breech   EYE SURGERY Bilateral 2012   LASER, CATARACT   TEE WITHOUT CARDIOVERSION N/A 12/31/2013   Procedure: TRANSESOPHAGEAL ECHOCARDIOGRAM (TEE);  Surgeon: Pamella Pert, MD;  Location: [facility] ENDOSCOPY;  Service: Cardiovascular;  Laterality: N/A;  H&P in file   TUBAL LIGATION Bilateral     Family History  Problem Relation Age of Onset   Dementia Mother    Diabetes Father    Hypertension Father    Hypertension Sister    Diabetes Sister    Diabetes Sister    Heart disease Sister    Colon polyps Daughter    Colon cancer Neg Hx    Esophageal cancer Neg Hx    Rectal cancer Neg Hx    Stomach cancer Neg Hx     Social History   Socioeconomic History   Marital status: Divorced    Spouse name: Not on file   Number of children: 3   Years of education: 12   Highest education level: 12th grade  Occupational  History    Employer: [employer]  Tobacco Use   Smoking status: Former    Passive exposure: Past   Smokeless tobacco: Never   Tobacco comments:    QUIT IN 1987  Vaping Use   Vaping status: Never Used  Substance and Sexual Activity   Alcohol use: No    Alcohol/week: 0.0 standard drinks of alcohol    Comment: QUIT IN 1987   Drug use: No    Frequency: 2.0 times per week    Comment: QUIT 1980   Sexual activity: Not Currently  Other Topics Concern   Not on file  Social History Narrative   Patient is divorced with 3 children.   Patient is right handed.   Patient has 12 th grade education.   Patient has not been drinking caffeine.   Social  Determinants of Health   Financial Resource Strain: Low Risk  (05/12/2022)   Overall Financial Resource Strain (CARDIA)    Difficulty of Paying Living Expenses: Not hard at all  Food Insecurity: No Food Insecurity (05/12/2022)   Hunger Vital Sign    Worried About Running Out of Food in the Last Year: Never true    Ran Out of Food in the Last Year: Never true  Transportation Needs: No Transportation Needs (05/12/2022)   PRAPARE - Administrator, Civil Service (Medical): No    Lack of Transportation (Non-Medical): No  Physical Activity: Inactive (05/12/2022)   Exercise Vital Sign    Days of Exercise per Week: 0 days    Minutes of Exercise per Session: 0 min  Stress: No Stress Concern Present (05/12/2022)   [employer] of Occupational Health - Occupational Stress Questionnaire    Feeling of Stress : Not at all  Social Connections: Moderately Integrated (03/01/2021)   Social Connection and Isolation Panel [NHANES]    Frequency of Communication with Friends and Family: More than three times a week    Frequency of Social Gatherings with Friends and Family: More than three times a week    Attends Religious Services: More than 4 times per year    Active Member of [employer] or Organizations: Yes    Attends Engineer, structural: More than 4 times per year    Marital Status: Divorced    Allergies  Allergen Reactions   Codeine Itching   Hydrocodone Itching   Oxycodone Itching   Penicillins Itching   Clindamycin/Lincomycin Itching   Niacin And Related Itching   Penicillin G Itching   Niacin Itching and Anxiety    Facial flushing    Outpatient Medications Prior to Visit  Medication Sig Dispense Refill   acetaminophen (TYLENOL) 500 MG tablet Take 500 mg by mouth every 6 (six) hours as needed for moderate pain.     aspirin EC 81 MG tablet Take 81 mg by mouth daily.      atorvastatin (LIPITOR) 80 MG tablet Take 1 tablet (80 mg total) by mouth daily. 90 tablet 1    carvedilol (COREG) 3.125 MG tablet Take 1 tablet (3.125 mg total) by mouth 2 (two) times daily with a meal. 180 tablet 1   cetirizine (ZYRTEC) 10 MG tablet Take 1 tablet (10 mg total) by mouth daily. 30 tablet 1   cholecalciferol (VITAMIN D3) 25 MCG (1000 UNIT) tablet Take 1 tablet (1,000 Units total) by mouth daily. 30 tablet 3   clopidogrel (PLAVIX) 75 MG tablet Take 1 tablet (75 mg total) by mouth daily. 90 tablet 1   Continuous Glucose Receiver (  FREESTYLE LIBRE 3 READER) DEVI 1 each by Does not apply route 3 (three) times daily. Please use to check blood sugar three times daily. E11.69 1 each 0   Continuous Glucose Sensor (FREESTYLE LIBRE 3 SENSOR) MISC Please use to check blood sugar three times daily. Change sensors once every 14 days. 2 each 6   diclofenac Sodium (VOLTAREN) 1 % GEL Apply 4 g topically 4 (four) times daily. 100 g 1   fluticasone (FLONASE) 50 MCG/ACT nasal spray Place 2 sprays into both nostrils daily. 16 g 2   guaiFENesin-dextromethorphan (ROBITUSSIN DM) 100-10 MG/5ML syrup Take 5 mLs by mouth every 6 (six) hours as needed for cough. 118 mL 0   insulin glargine (LANTUS) 100 UNIT/ML Solostar Pen Inject 30 Units into the skin in the morning AND 22 Units every evening. 30 mL 6   Insulin Pen Needle (B-D UF III MINI PEN NEEDLES) 31G X 5 MM MISC use as directed 4 (four) times daily - after meals and at bedtime. 100 each 3   losartan-hydrochlorothiazide (HYZAAR) 100-25 MG tablet Take 1 tablet by mouth daily. 90 tablet 1   metoCLOPramide (REGLAN) 5 MG tablet Take 1 tablet (5 mg total) by mouth every 8 (eight) hours as needed for nausea. 90 tablet 1   Misc. Devices MISC Blood pressure monitor. Dx: Hypertension 1 each 0   tirzepatide (MOUNJARO) 7.5 MG/0.5ML Pen Inject 7.5 mg into the skin once a week. For 4 weeks then increase to 10 mg once a week 6 mL 0   famotidine (PEPCID) 20 MG tablet Take 1 tablet (20 mg total) by mouth 2 (two) times daily. 180 tablet 1   insulin glargine (LANTUS  SOLOSTAR) 100 UNIT/ML Solostar Pen Inject 35 Units into the skin in the morning AND 22 Units every evening. 30 mL 6   No facility-administered medications prior to visit.     ROS Review of Systems  Constitutional:  Negative for activity change and appetite change.  HENT:  Negative for sinus pressure and sore throat.   Eyes:  Positive for visual disturbance.  Respiratory:  Negative for chest tightness, shortness of breath and wheezing.   Cardiovascular:  Negative for chest pain and palpitations.  Gastrointestinal:  Negative for abdominal distention, abdominal pain and constipation.  Genitourinary: Negative.   Musculoskeletal: Negative.   Psychiatric/Behavioral:  Negative for behavioral problems and dysphoric mood.    *** Objective:  BP 115/76   Pulse 84   Ht 5\' 6"  (1.676 m)   Wt 197 lb (89.4 kg)   SpO2 100%   BMI 31.80 kg/m      11/01/2022    3:33 PM 07/26/2022    2:59 PM 05/12/2022   12:26 PM  BP/Weight  Systolic BP 115 135 --  Diastolic BP 76 78 --  Wt. (Lbs) 197 199.4 200  BMI 31.8 kg/m2 32.18 kg/m2 32.28 kg/m2      Physical Exam Constitutional:      Appearance: She is well-developed.  Cardiovascular:     Rate and Rhythm: Normal rate.     Heart sounds: Normal heart sounds. No murmur heard. Pulmonary:     Effort: Pulmonary effort is normal.     Breath sounds: Normal breath sounds. No wheezing or rales.  Chest:     Chest wall: No tenderness.  Abdominal:     General: Bowel sounds are normal. There is no distension.     Palpations: Abdomen is soft. There is no mass.     Tenderness: There is no abdominal  tenderness.  Musculoskeletal:        General: Normal range of motion.     Right lower leg: No edema.     Left lower leg: No edema.  Neurological:     Mental Status: She is alert and oriented to person, place, and time.  Psychiatric:        Mood and Affect: Mood normal.    ***    Latest Ref Rng & Units 07/26/2022    4:25 PM 05/02/2022    3:28 PM 08/02/2021     4:00 PM  CMP  Glucose 70 - 99 mg/dL 86  132  440   BUN 8 - 27 mg/dL 32  39  25   Creatinine 0.57 - 1.00 mg/dL 1.02  7.25  3.66   Sodium 134 - 144 mmol/L 142  136  138   Potassium 3.5 - 5.2 mmol/L 4.1  4.6  4.6   Chloride 96 - 106 mmol/L 104  97  100   CO2 20 - 29 mmol/L 22  20  22    Calcium 8.7 - 10.3 mg/dL 9.7  44.0  9.4   Total Protein 6.0 - 8.5 g/dL 7.3  6.9  6.9   Total Bilirubin 0.0 - 1.2 mg/dL 0.2  0.3  <3.4   Alkaline Phos 44 - 121 IU/L 98  120  105   AST 0 - 40 IU/L 17  14  15    ALT 0 - 32 IU/L 14  20  19      Lipid Panel     Component Value Date/Time   CHOL 202 (H) 05/07/2019 1049   TRIG 122 05/07/2019 1049   HDL 46 05/07/2019 1049   CHOLHDL 4.4 05/07/2019 1049   CHOLHDL 2.7 01/04/2016 1138   VLDL 12 01/04/2016 1138   LDLCALC 134 (H) 05/07/2019 1049    CBC    Component Value Date/Time   WBC 9.7 10/19/2020 1649   WBC 9.6 02/13/2019 2030   RBC 3.71 (L) 10/19/2020 1649   RBC 3.14 (L) 02/13/2019 2030   HGB 11.5 10/19/2020 1649   HCT 33.4 (L) 10/19/2020 1649   PLT 508 (H) 10/19/2020 1649   MCV 90 10/19/2020 1649   MCH 31.0 10/19/2020 1649   MCH 30.6 02/13/2019 2030   MCHC 34.4 10/19/2020 1649   MCHC 32.9 02/13/2019 2030   RDW 11.8 10/19/2020 1649   LYMPHSABS 2.7 10/19/2020 1649   MONOABS 0.4 02/13/2019 2030   EOSABS 0.3 10/19/2020 1649   BASOSABS 0.1 10/19/2020 1649    Lab Results  Component Value Date   HGBA1C 9.4 (A) 11/01/2022    Assessment & Plan:  Assessment and Plan    Sleep Disturbance Reports waking up early in the morning and sleeping during the day. Prefers to manage sleep issues naturally. -Encouraged to maintain a regular sleep schedule, avoid napping during the day, and engage in late afternoon/evening exercise.  Type 2 Diabetes Mellitus A1c is 9.4, indicating poor glycemic control. Reports hypoglycemic episodes in the early morning hours. Patient is currently on 30 units of insulin in the morning and 22 units in the evening, and  Mounjaro 7.5mg  once a week. -Continue current insulin regimen. -Encouraged to improve diet, specifically to replace high-sugar snacks with fruit and limit starchy foods. -Check continuous glucose monitor readings regularly. -Order labs to monitor glycemic control.  Constipation Reports constipation, possibly related to [facility] use. -Encouraged to use Miralax for constipation relief. -Continue Mounjaro 7.5mg  once a week, but consider discontinuing if constipation becomes unmanageable.  Hypertension Blood pressure is well-controlled. -Continue current antihypertensive medication.  General Health Maintenance -Administer influenza vaccine today. -Schedule follow-up with nephrologist to monitor kidney function. -Order labs to check kidney function. -Plan to follow up in three months to assess improvement in A1c.          Meds ordered this encounter  Medications   famotidine (PEPCID) 20 MG tablet    Sig: Take 1 tablet (20 mg total) by mouth 2 (two) times daily.    Dispense:  180 tablet    Refill:  1    Follow-up: Return in about 3 months (around 02/01/2023) for Chronic medical conditions.       Hoy Register, MD, FAAFP. [facility] [place], [place] [phone]   11/01/2022, 5:16 PM

## 2022-11-03 ENCOUNTER — Ambulatory Visit: Payer: Self-pay

## 2022-11-03 NOTE — Patient Outreach (Signed)
Care Coordination   11/03/2022 Name: ARIAUNA ANDRESEN MRN: [id number] DOB: February 27, 1955   Care Coordination Outreach Attempts:  An unsuccessful telephone outreach was attempted for a scheduled appointment today.  Follow Up Plan:  Additional outreach attempts will be made to offer the patient care coordination information and services.   Encounter Outcome:  No Answer   Care Coordination Interventions:  No, not indicated    Delsa Sale RN BSN CCM [place]  [facility], [facility] Nurse Care Coordinator  Direct Dial: [phone] Website: [email or web address]

## 2022-11-05 LAB — CMP14+EGFR
ALT: 25 [IU]/L (ref 0–32)
AST: 23 [IU]/L (ref 0–40)
Albumin: 4.2 g/dL (ref 3.9–4.9)
Alkaline Phosphatase: 105 [IU]/L (ref 44–121)
BUN/Creatinine Ratio: 11 — ABNORMAL LOW (ref 12–28)
BUN: 24 mg/dL (ref 8–27)
Bilirubin Total: 0.2 mg/dL (ref 0.0–1.2)
CO2: 23 mmol/L (ref 20–29)
Calcium: 9.5 mg/dL (ref 8.7–10.3)
Chloride: 100 mmol/L (ref 96–106)
Creatinine, Ser: 2.13 mg/dL — ABNORMAL HIGH (ref 0.57–1.00)
Globulin, Total: 2.5 g/dL (ref 1.5–4.5)
Glucose: 137 mg/dL — ABNORMAL HIGH (ref 70–99)
Potassium: 3.7 mmol/L (ref 3.5–5.2)
Sodium: 138 mmol/L (ref 134–144)
Total Protein: 6.7 g/dL (ref 6.0–8.5)
eGFR: 25 mL/min/{1.73_m2} — ABNORMAL LOW (ref >59)

## 2022-11-05 LAB — MICROALBUMIN / CREATININE URINE RATIO

## 2022-11-08 ENCOUNTER — Other Ambulatory Visit: Payer: Self-pay

## 2022-11-08 ENCOUNTER — Other Ambulatory Visit (HOSPITAL_COMMUNITY): Payer: Self-pay

## 2022-11-08 ENCOUNTER — Other Ambulatory Visit: Payer: Self-pay | Admitting: Family Medicine

## 2022-11-08 ENCOUNTER — Telehealth: Payer: Self-pay | Admitting: *Deleted

## 2022-11-08 DIAGNOSIS — Z794 Long term (current) use of insulin: Secondary | ICD-10-CM

## 2022-11-08 NOTE — Progress Notes (Signed)
Care Coordination Note  11/08/2022 Name: [name] MRN: [id number] DOB: [birth year]  Adrienne [name] is a 67 y.o. year old female who is a primary care patient of Hoy Register, MD and is actively engaged with the care management team. I reached out to Adrienne [name] by phone today to assist with re-scheduling a follow up visit with the RN Case Manager  Follow up plan: Unsuccessful telephone outreach attempt made. A HIPAA compliant phone message was left for the patient providing contact information and requesting a return call.   [facility]  Care Coordination Care Guide  Direct Dial: [phone]

## 2022-11-09 MED ORDER — LANTUS SOLOSTAR 100 UNIT/ML ~~LOC~~ SOPN
PEN_INJECTOR | SUBCUTANEOUS | 1 refills | Status: DC
  Filled 2022-11-09: qty 45, 87d supply, fill #0
  Filled 2022-12-22 – 2023-01-13 (×2): qty 45, 87d supply, fill #1

## 2022-11-09 NOTE — Telephone Encounter (Signed)
Requested medication (s) are due for refill today:   Requested medication (s) are on the active medication list: Yes  Last refill:    Future visit scheduled: Yes  Notes to clinic:  Says medication D/C 11/01/22, but still on medication list.    Requested Prescriptions  Pending Prescriptions Disp Refills   insulin glargine (LANTUS SOLOSTAR) 100 UNIT/ML Solostar Pen 30 mL 6    Sig: Inject 35 Units into the skin in the morning AND 22 Units every evening.     Endocrinology:  Diabetes - Insulins Failed - 11/08/2022 10:42 AM      Failed - HBA1C is between 0 and 7.9 and within 180 days    HbA1c, POC (controlled diabetic range)  Date Value Ref Range Status  11/01/2022 9.4 (A) 0.0 - 7.0 % Final         Passed - Valid encounter within last 6 months    Recent Outpatient Visits           1 week ago Type 2 diabetes mellitus with other specified complication, with long-term current use of insulin (HCC)   [place] [facility] [place], [place], MD   3 months ago Type 2 diabetes mellitus with other specified complication, with long-term current use of insulin (HCC)   [place] [facility] [place], [place], MD   5 months ago Type 2 diabetes mellitus with other specified complication, with long-term current use of insulin (HCC)   [place] [facility] [place], [place], MD   6 months ago Type 2 diabetes mellitus with other specified complication, with long-term current use of insulin (HCC)   [place] [facility] [place], [place], MD   9 months ago Type 2 diabetes mellitus with other specified complication, with long-term current use of insulin [facility])   [place] [facility] Hoy Register, MD       Future Appointments             In 3 months Hoy Register, MD [facility]

## 2022-11-10 ENCOUNTER — Other Ambulatory Visit: Payer: Self-pay

## 2022-11-15 ENCOUNTER — Telehealth: Payer: Self-pay

## 2022-11-15 NOTE — Telephone Encounter (Signed)
Pt was called and she states that she is requesting call from [facility] nurse April. I informed patient that I will reach out to April and inform her.

## 2022-11-15 NOTE — Progress Notes (Signed)
Care Coordination Note  11/15/2022 Name: Adrienne [name] MRN: [id number] DOB: 07-04-1955  Adrienne [name] is a 67 y.o. year old female who is a primary care patient of Hoy Register, MD and is actively engaged with the care management team. I reached out to Adrienne [name] by phone today to assist with re-scheduling a follow up visit with the RN Case Manager  Follow up plan: Telephone appointment with care management team member scheduled for:11/22/22  [facility] Coordination Care Guide  Direct Dial: [phone]

## 2022-11-15 NOTE — Telephone Encounter (Signed)
Copied from CRM [id number]. Topic: General - Inquiry >> Nov 14, 2022  4:40 PM Adrienne [name] wrote: Reason for CRM: pt called asking if the nurse who helps he order supplies would call her back tomorrow  CB@  [phone]

## 2022-11-22 ENCOUNTER — Ambulatory Visit: Payer: Self-pay

## 2022-11-22 NOTE — Patient Instructions (Signed)
Visit Information  Thank you for taking time to visit with me today. Please don't hesitate to contact me if I can be of assistance to you.   Following are the goals we discussed today:   Goals Addressed               This Visit's Progress     Patient Stated     I want to improve my diabetes (pt-stated)   Not on track     Care Coordination Interventions: Evaluation of current treatment plan related to type 2 diabetes and patient's adherence to plan as established by provider Review of patient status, including review of consultants reports, relevant laboratory and other test results Reviewed medications with patient and discussed importance of medication adherence, patient denies having barriers to taking or refilling her medications  Educated patient about importance of dietary adherence, counseled on diabetic diet including using the plate method, portion control and implementing daily routine exercise regimen Determined patient's sleep schedule is off, educated on tips to help establish a bedtime ritual, being consistent with bedtime, using lavender to help relax and calm self to help fall asleep and stay asleep Referral sent to Remigio Eisenmenger BSW to assist with OTC orders  Reviewed scheduled/upcoming provider appointments including: PCP follow up with Dr. Alvis Lemmings scheduled for 02/07/23 @2 :10 PM Assessed patient's understanding of A1c goal: <7% Lab Results  Component Value Date   HGBA1C 9.4 (A) 11/01/2022          Our next appointment is by telephone on 02/09/23 at 2:00 PM  Please call the care guide team at [phone] if you need to cancel or reschedule your appointment.   If you are experiencing a Mental Health or Behavioral Health Crisis or need someone to talk to, please call 1-800-273-TALK (toll free, 24 hour hotline)  The patient verbalized understanding of instructions, educational materials, and care plan provided today and DECLINED offer to receive copy of patient  instructions, educational materials, and care plan.   Delsa Sale RN BSN CCM [place]  [facility], [facility] Health Nurse Care Coordinator  Direct Dial: [phone] Website: [email or web address]

## 2022-11-22 NOTE — Patient Outreach (Signed)
  Care Coordination   Follow Up Visit Note   11/22/2022 Name: YILIA SACCA MRN: [id number] DOB: 04/03/1955  [name] is a 67 y.o. year old female who sees Hoy Register, MD for primary care. I spoke with  [name] by phone today.  What matters to the patients health and wellness today?  Patient would like to make some dietary changes and start exercising to help lower her A1c.     Goals Addressed               This Visit's Progress     Patient Stated     I want to improve my diabetes (pt-stated)   Not on track     Care Coordination Interventions: Evaluation of current treatment plan related to type 2 diabetes and patient's adherence to plan as established by provider Review of patient status, including review of consultants reports, relevant laboratory and other test results Reviewed medications with patient and discussed importance of medication adherence, patient denies having barriers to taking or refilling her medications  Educated patient about importance of dietary adherence, counseled on diabetic diet including using the plate method, portion control and implementing daily routine exercise regimen Determined patient's sleep schedule is off, educated on tips to help establish a bedtime ritual, being consistent with bedtime, using lavender to help relax and calm self to help fall asleep and stay asleep Referral sent to Remigio Eisenmenger BSW to assist with OTC orders  Reviewed scheduled/upcoming provider appointments including: PCP follow up with Dr. Alvis Lemmings scheduled for 02/07/23 @2 :10 PM Assessed patient's understanding of A1c goal: <7% Lab Results  Component Value Date   HGBA1C 9.4 (A) 11/01/2022      Interventions Today    Flowsheet Row Most Recent Value  Chronic Disease   Chronic disease during today's visit Diabetes  General Interventions   General Interventions Discussed/Reviewed General Interventions Discussed, General Interventions  Reviewed, Labs, Doctor Visits, Communication with  Doctor Visits Discussed/Reviewed Doctor Visits Discussed, Doctor Visits Reviewed, PCP  Communication with Social Work  Remigio Eisenmenger BSW]  Exercise Interventions   Exercise Discussed/Reviewed Physical Activity, Exercise Reviewed, Exercise Discussed  Physical Activity Discussed/Reviewed Physical Activity Reviewed, Physical Activity Discussed  Education Interventions   Education Provided Provided Education  Provided Verbal Education On Nutrition, Labs, Medication, Exercise, When to see the doctor, Eye Care, Foot Care  Labs Reviewed Hgb A1c  Nutrition Interventions   Nutrition Discussed/Reviewed Nutrition Discussed, Nutrition Reviewed, Carbohydrate meal planning, Adding fruits and vegetables, Portion sizes  Pharmacy Interventions   Pharmacy Dicussed/Reviewed Pharmacy Topics Discussed, Pharmacy Topics Reviewed, Medications and their functions, Medication Adherence          SDOH assessments and interventions completed:  No     Care Coordination Interventions:  Yes, provided   Follow up plan: Referral made to Remigio Eisenmenger BSW to assist with order of OTC products  Follow up call scheduled for 02/09/23 @2 :00 PM     Encounter Outcome:  Patient Visit Completed

## 2022-11-23 ENCOUNTER — Encounter: Payer: Self-pay | Admitting: Licensed Clinical Social Worker

## 2022-11-23 NOTE — Patient Outreach (Signed)
  Care Coordination   Multidisciplinary Case Review Note    11/23/2022 Name: Adrienne [name] MRN: [id number] DOB: 1955-12-02  [name] is a 67 y.o. year old female who sees Hoy Register, MD for primary care.  The  multidisciplinary care team met today to review patient care needs and barriers.     Goals Addressed   None     SDOH assessments and interventions completed:  No     Care Coordination Interventions Activated:  Yes  Interventions Today    Flowsheet Row Most Recent Value  Chronic Disease   Chronic disease during today's visit Diabetes  General Interventions   General Interventions Discussed/Reviewed General Interventions Discussed, General Interventions Reviewed, Communication with  Nira Conn Orders each qtr]  Communication with RN, Social Work  [employer] collaborated with [employer] and RNCM regarding Community education officer order]        Care Coordination Interventions:  Yes, provided   Follow up plan:  Team will continue to follow up with patient to assist with her Pensions consultant order    Multidisciplinary Team Attendees:   Jenel Lucks, LCSW Angel Little, RNCM Alfonzo Beers, PhD  Scribe for Multidisciplinary Case Review:  Jeanie Cooks, PhD [facility], [facility] Social Worker Direct Dial: [phone]  Fax: [phone]

## 2022-11-25 ENCOUNTER — Other Ambulatory Visit (HOSPITAL_COMMUNITY): Payer: Self-pay

## 2022-12-05 ENCOUNTER — Other Ambulatory Visit (HOSPITAL_COMMUNITY): Payer: Self-pay

## 2022-12-05 ENCOUNTER — Other Ambulatory Visit: Payer: Self-pay | Admitting: Family Medicine

## 2022-12-06 MED ORDER — MOUNJARO 7.5 MG/0.5ML ~~LOC~~ SOAJ
7.5000 mg | SUBCUTANEOUS | 0 refills | Status: DC
Start: 2022-12-06 — End: 2023-02-07
  Filled 2022-12-06: qty 2, 28d supply, fill #0
  Filled 2022-12-30: qty 2, 28d supply, fill #1
  Filled 2023-01-27: qty 2, 28d supply, fill #2

## 2022-12-06 NOTE — Telephone Encounter (Signed)
Requested medication (s) are due for refill today: Yes  Requested medication (s) are on the active medication list: Yes  Last refill:  09/22/22  Future visit scheduled: Yes  Notes to clinic:  Manual review.    Requested Prescriptions  Pending Prescriptions Disp Refills   tirzepatide (MOUNJARO) 7.5 MG/0.5ML Pen 6 mL 0    Sig: Inject 7.5 mg into the skin once a week. For 4 weeks then increase to 10 mg once a week     Off-Protocol Failed - 12/05/2022  8:47 AM      Failed - Medication not assigned to a protocol, review manually.      Passed - Valid encounter within last 12 months    Recent Outpatient Visits           1 month ago Type 2 diabetes mellitus with other specified complication, with long-term current use of insulin (HCC)   [place] [facility] [place] Hoy Register, MD   4 months ago Type 2 diabetes mellitus with other specified complication, with long-term current use of insulin (HCC)   [place] [facility] [place] [facility] [place] Hoy Register, MD   6 months ago Type 2 diabetes mellitus with other specified complication, with long-term current use of insulin (HCC)   [place] [facility] [place]. [facility] Hoy Register, MD   7 months ago Type 2 diabetes mellitus with other specified complication, with long-term current use of insulin (HCC)   [place] [facility] [place] [facility] [place]. [facility] Hoy Register, MD   10 months ago Type 2 diabetes mellitus with other specified complication, with long-term current use of insulin (HCC)   [place] [facility] [place] [facility] [place] Hoy Register, MD       Future Appointments             In 2 months Hoy Register, MD [facility] - [facility] Dept Of [place]. [facility]

## 2022-12-07 ENCOUNTER — Other Ambulatory Visit (HOSPITAL_COMMUNITY): Payer: Self-pay

## 2022-12-07 ENCOUNTER — Other Ambulatory Visit: Payer: Self-pay

## 2022-12-08 ENCOUNTER — Other Ambulatory Visit (HOSPITAL_COMMUNITY): Payer: Self-pay

## 2022-12-09 ENCOUNTER — Other Ambulatory Visit (HOSPITAL_COMMUNITY): Payer: Self-pay

## 2022-12-21 ENCOUNTER — Ambulatory Visit: Payer: Self-pay | Admitting: Licensed Clinical Social Worker

## 2022-12-21 NOTE — Patient Outreach (Signed)
  Care Coordination   12/21/2022 Name: Adrienne [name] MRN: [id number] DOB: Dec 15, 1955   Care Coordination Outreach Attempts:  An unsuccessful telephone outreach was attempted today to offer the patient information about available care coordination services. SW attempted to call the patient back after receiving a call from the patient, Not able to leave a message on VM  Follow Up Plan:  Additional outreach attempts will be made to offer the patient care coordination information and services.   Encounter Outcome:  No Answer   Care Coordination Interventions:  No, not indicated   Jeanie Cooks, PhD [facility], [facility] Social Worker Direct Dial: [phone]  Fax: [phone]

## 2022-12-22 ENCOUNTER — Other Ambulatory Visit: Payer: Self-pay

## 2022-12-30 ENCOUNTER — Other Ambulatory Visit: Payer: Self-pay

## 2022-12-30 ENCOUNTER — Ambulatory Visit: Payer: Self-pay | Admitting: Licensed Clinical Social Worker

## 2022-12-30 ENCOUNTER — Other Ambulatory Visit (HOSPITAL_COMMUNITY): Payer: Self-pay

## 2022-12-30 NOTE — Patient Instructions (Signed)
Visit Information  Thank you for taking time to visit with me today. Please don't hesitate to contact me if I can be of assistance to you.   Following are the goals we discussed today:   Goals Addressed             This Visit's Progress    Care Coordination Activities       Care Coordination Interventions: SW assisted the patient with ordering some supplies from [employer]. SW encouraged the patient to look for a support ie. Family or Friend to assist her with her quarterly supply order.  SW will call the patient in 03/24/2023 to do her quarterly order.         Our next appointment is by telephone on 03/24/2023 at 1:00 pm  Please call the care guide team at [phone] if you need to cancel or reschedule your appointment.   If you are experiencing a Mental Health or Behavioral Health Crisis or need someone to talk to, please call the Suicide and Crisis Lifeline: 988 go to [facility] [address] [place] [phone]) call 911  The patient verbalized understanding of instructions, educational materials, and care plan provided today and DECLINED offer to receive copy of patient instructions, educational materials, and care plan.   Jeanie Cooks, PhD [facility], [facility] Social Worker Direct Dial: [phone]  Fax: [phone]

## 2022-12-30 NOTE — Patient Outreach (Signed)
  Care Coordination   Follow Up Visit Note   12/30/2022 Name: Adrienne [name] MRN: [id number] DOB: 09/19/1955  Adrienne [name] is a 67 y.o. year old female who sees Hoy Register, MD for primary care. I spoke with  Adrienne [name] by phone today.  What matters to the patients health and wellness today?  Quarterly order for personal supplies     Goals Addressed             This Visit's Progress    Care Coordination Activities       Care Coordination Interventions: SW assisted the patient with ordering some supplies from [employer]. SW encouraged the patient to look for a support ie. Family or Friend to assist her with her quarterly supply order.  SW will call the patient in 03/24/2023 to do her quarterly order.         SDOH assessments and interventions completed:  Yes  SDOH Interventions Today    Flowsheet Row Most Recent Value  SDOH Interventions   Food Insecurity Interventions Intervention Not Indicated  Housing Interventions Intervention Not Indicated  Transportation Interventions Intervention Not Indicated  Utilities Interventions Intervention Not Indicated        Care Coordination Interventions:  Yes, provided  Interventions Today    Flowsheet Row Most Recent Value  General Interventions   General Interventions Discussed/Reviewed General Interventions Reviewed, [employer] wiht ordering supplies for [employer]]        Follow up plan: Follow up call scheduled for 03/24/2023 at 1:00 pm    Encounter Outcome:  Patient Visit Completed   Jeanie Cooks, PhD [facility], [facility] Social Worker Direct Dial: [phone]  Fax: [phone]

## 2023-01-13 ENCOUNTER — Other Ambulatory Visit: Payer: Self-pay | Admitting: Family Medicine

## 2023-01-13 ENCOUNTER — Other Ambulatory Visit: Payer: Self-pay

## 2023-01-13 ENCOUNTER — Other Ambulatory Visit (HOSPITAL_COMMUNITY): Payer: Self-pay

## 2023-01-13 MED ORDER — FREESTYLE LIBRE 3 SENSOR MISC
6 refills | Status: DC
  Filled 2023-01-13: qty 2, fill #0
  Filled 2023-01-27: qty 2, 28d supply, fill #0
  Filled 2023-03-15: qty 2, 28d supply, fill #1
  Filled 2023-04-12: qty 2, 28d supply, fill #2
  Filled 2023-05-17: qty 2, 28d supply, fill #3
  Filled 2023-06-15: qty 2, 28d supply, fill #4
  Filled 2023-07-15: qty 2, 28d supply, fill #5
  Filled 2023-08-03 – 2023-08-07 (×2): qty 2, 28d supply, fill #6

## 2023-01-16 ENCOUNTER — Other Ambulatory Visit: Payer: Self-pay

## 2023-01-17 ENCOUNTER — Other Ambulatory Visit (HOSPITAL_COMMUNITY): Payer: Self-pay

## 2023-01-27 ENCOUNTER — Other Ambulatory Visit (HOSPITAL_COMMUNITY): Payer: Self-pay

## 2023-01-28 ENCOUNTER — Other Ambulatory Visit (HOSPITAL_COMMUNITY): Payer: Self-pay

## 2023-01-30 ENCOUNTER — Other Ambulatory Visit: Payer: Self-pay

## 2023-02-07 ENCOUNTER — Other Ambulatory Visit: Payer: Self-pay

## 2023-02-07 ENCOUNTER — Ambulatory Visit: Payer: Medicare HMO | Attending: Family Medicine | Admitting: Family Medicine

## 2023-02-07 ENCOUNTER — Encounter: Payer: Self-pay | Admitting: Family Medicine

## 2023-02-07 VITALS — BP 131/81 | HR 76 | Ht 66.0 in | Wt 194.0 lb

## 2023-02-07 DIAGNOSIS — K5909 Other constipation: Secondary | ICD-10-CM

## 2023-02-07 DIAGNOSIS — K219 Gastro-esophageal reflux disease without esophagitis: Secondary | ICD-10-CM | POA: Diagnosis not present

## 2023-02-07 DIAGNOSIS — S6992XD Unspecified injury of left wrist, hand and finger(s), subsequent encounter: Secondary | ICD-10-CM

## 2023-02-07 DIAGNOSIS — E1122 Type 2 diabetes mellitus with diabetic chronic kidney disease: Secondary | ICD-10-CM

## 2023-02-07 DIAGNOSIS — E785 Hyperlipidemia, unspecified: Secondary | ICD-10-CM | POA: Diagnosis not present

## 2023-02-07 DIAGNOSIS — I129 Hypertensive chronic kidney disease with stage 1 through stage 4 chronic kidney disease, or unspecified chronic kidney disease: Secondary | ICD-10-CM

## 2023-02-07 DIAGNOSIS — Z7985 Long-term (current) use of injectable non-insulin antidiabetic drugs: Secondary | ICD-10-CM | POA: Diagnosis not present

## 2023-02-07 DIAGNOSIS — Z794 Long term (current) use of insulin: Secondary | ICD-10-CM

## 2023-02-07 DIAGNOSIS — E1169 Type 2 diabetes mellitus with other specified complication: Secondary | ICD-10-CM | POA: Diagnosis not present

## 2023-02-07 LAB — POCT GLYCOSYLATED HEMOGLOBIN (HGB A1C): HbA1c, POC (controlled diabetic range): 8.2 % — AB (ref 0.0–7.0)

## 2023-02-07 MED ORDER — ATORVASTATIN CALCIUM 80 MG PO TABS
80.0000 mg | ORAL_TABLET | Freq: Every day | ORAL | 1 refills | Status: DC
  Filled 2023-02-07 – 2023-03-22 (×3): qty 90, 90d supply, fill #0

## 2023-02-07 MED ORDER — LOSARTAN POTASSIUM-HCTZ 100-25 MG PO TABS
1.0000 | ORAL_TABLET | Freq: Every day | ORAL | 1 refills | Status: DC
  Filled 2023-02-07 – 2023-03-15 (×3): qty 90, 90d supply, fill #0

## 2023-02-07 MED ORDER — LANTUS SOLOSTAR 100 UNIT/ML ~~LOC~~ SOPN
PEN_INJECTOR | SUBCUTANEOUS | 1 refills | Status: DC
  Filled 2023-02-07 – 2023-03-22 (×2): qty 45, 87d supply, fill #0

## 2023-02-07 MED ORDER — TIRZEPATIDE 10 MG/0.5ML ~~LOC~~ SOAJ
10.0000 mg | SUBCUTANEOUS | 6 refills | Status: DC
  Filled 2023-02-07 – 2023-02-27 (×2): qty 6, 84d supply, fill #0
  Filled 2023-05-26: qty 2, 28d supply, fill #1

## 2023-02-07 MED ORDER — CARVEDILOL 3.125 MG PO TABS
3.1250 mg | ORAL_TABLET | Freq: Two times a day (BID) | ORAL | 1 refills | Status: DC
  Filled 2023-02-07 – 2023-04-12 (×2): qty 180, 90d supply, fill #0

## 2023-02-07 MED ORDER — CLOPIDOGREL BISULFATE 75 MG PO TABS
75.0000 mg | ORAL_TABLET | Freq: Every day | ORAL | 1 refills | Status: DC
  Filled 2023-02-07 – 2023-04-12 (×2): qty 90, 90d supply, fill #0

## 2023-02-07 MED ORDER — FAMOTIDINE 20 MG PO TABS
20.0000 mg | ORAL_TABLET | Freq: Two times a day (BID) | ORAL | 1 refills | Status: DC
  Filled 2023-02-07 – 2023-04-12 (×2): qty 180, 90d supply, fill #0

## 2023-02-07 NOTE — Patient Instructions (Signed)
VISIT SUMMARY:  During today's visit, we discussed your ongoing issues with constipation and gas, which you believe are related to your diabetes medication, Mounjaro. We also reviewed your diabetes management, noting an improvement in your A1c levels. 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  YOUR PLAN:  -TYPE 2 DIABETES MELLITUS: Type 2 Diabetes Mellitus is a condition where your body does not use insulin properly, leading to high blood sugar levels. Your A1c has improved from 9.4 to 8.2, which is a positive step. We discussed your constipation and abdominal discomfort with Mounjaro. You will continue with Mounjaro 7.5mg  weekly for 4 more weeks, then increase to 10mg  weekly. For constipation, continue taking Miralax daily. We will check your A1c again in 3 months.  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 Currently, it does not require any intervention.  -GENERAL HEALTH MAINTENANCE: Continue with your current insulin regimen of 30 units in the morning and 22 units in the evening. We discussed the importance of dietary modifications to better control your blood sugar levels. Please schedule a follow-up appointment with your nephrologist.  INSTRUCTIONS:  Please continue with Mounjaro 7.5mg  weekly for 4 more weeks, then increase to 10mg  weekly. Take Miralax daily for constipation. We will check your A1c again in 3 months. Also, schedule a follow-up appointment with your nephrologist.

## 2023-02-07 NOTE — Progress Notes (Signed)
Subjective:  Patient ID: Adrienne [name], female    DOB: 1955-09-13  Age: 68 y.o. MRN: [id number]  CC: Medical Management of Chronic Issues (Discuss Mounjaro)   HPI DAWNEL [name] is a 68 y.o. year old female with a history of uncontrolled type 2 diabetes mellitus (A1c 8.2), anemia, central retinal artery occlusion (previously followed by 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   Interval History: Discussed the use of AI scribe software for clinical note transcription with the patient, who gave verbal consent to proceed.  She presents with ongoing issues of constipation and gas, which she attributes to her medication, Mounjaro. She expresses interest in exploring other medication options, but ultimately decides to continue with Mounjaro (as she states she cannot take Ozempic or Trulicity) and manage her constipation with Miralax. Her A1c levels have decreased from 9.4 to 8.2, but acknowledges that she needs to continue working on her diabetes management, particularly in regards to her diet. She also mentions a finger injury that occurred three months ago, which has resulted in a permanent bony deformity.  For her CKD she is managed by [facility] and needs to call to secure an appointment. She is doing well on her statin and her antihypertensive.       Past Medical History:  Diagnosis Date   Blindness of both eyes    Cataract    Diabetes (HCC)    GERD (gastroesophageal reflux disease)    Glaucoma    Heart murmur    High cholesterol    Hypertension    Stroke (HCC) 2015   R eye    Past Surgical History:  Procedure Laterality Date   CATARACT EXTRACTION, BILATERAL     Laser eye surgery, cataract surgery bi-lat   CESAREAN SECTION     Twins, Breech   EYE SURGERY Bilateral 2012   LASER, CATARACT   TEE WITHOUT CARDIOVERSION N/A 12/31/2013    Procedure: TRANSESOPHAGEAL ECHOCARDIOGRAM (TEE);  Surgeon: Pamella Pert, MD;  Location: [facility] ENDOSCOPY;  Service: Cardiovascular;  Laterality: N/A;  H&P in file   TUBAL LIGATION Bilateral     Family History  Problem Relation Age of Onset   Dementia Mother    Diabetes Father    Hypertension Father    Hypertension Sister    Diabetes Sister    Diabetes Sister    Heart disease Sister    Colon polyps Daughter    Colon cancer Neg Hx    Esophageal cancer Neg Hx    Rectal cancer Neg Hx    Stomach cancer Neg Hx     Social History   Socioeconomic History   Marital status: Divorced    Spouse name: Not on file   Number of children: 3   Years of education: 12   Highest education level: 12th grade  Occupational History    Employer: [employer]  Tobacco Use   Smoking status: Former    Passive exposure: Past   Smokeless tobacco: Never   Tobacco comments:    QUIT IN 1987  Vaping Use   Vaping status: Never Used  Substance and Sexual Activity   Alcohol use: No    Alcohol/week: 0.0 standard drinks of alcohol    Comment: QUIT IN 1987   Drug use: No    Frequency: 2.0 times per week    Comment: QUIT 1980   Sexual activity: Not Currently  Other Topics Concern   Not on file  Social History Narrative   Patient is divorced with 3 children.   Patient is right handed.   Patient has 12 th grade education.   Patient has not been drinking caffeine.   Social Drivers of Corporate investment banker Strain: Low Risk  (05/12/2022)   Overall Financial Resource Strain (CARDIA)    Difficulty of Paying Living Expenses: Not hard at all  Food Insecurity: No Food Insecurity (12/30/2022)   Hunger Vital Sign    Worried About Running Out of Food in the Last Year: Never true    Ran Out of Food in the Last Year: Never true  Transportation Needs: No Transportation Needs (12/30/2022)   PRAPARE - Administrator, Civil Service (Medical): No    Lack of Transportation (Non-Medical):  No  Physical Activity: Inactive (05/12/2022)   Exercise Vital Sign    Days of Exercise per Week: 0 days    Minutes of Exercise per Session: 0 min  Stress: No Stress Concern Present (05/12/2022)   [employer] of Occupational Health - Occupational Stress Questionnaire    Feeling of Stress : Not at all  Social Connections: Moderately Integrated (03/01/2021)   Social Connection and Isolation Panel [NHANES]    Frequency of Communication with Friends and Family: More than three times a week    Frequency of Social Gatherings with Friends and Family: More than three times a week    Attends Religious Services: More than 4 times per year    Active Member of [employer] or Organizations: Yes    Attends Engineer, structural: More than 4 times per year    Marital Status: Divorced    Allergies  Allergen Reactions   Codeine Itching   Hydrocodone Itching   Oxycodone Itching   Penicillins Itching   Clindamycin/Lincomycin Itching   Niacin And Related Itching   Penicillin G Itching   Niacin Itching and Anxiety    Facial flushing    Outpatient Medications Prior to Visit  Medication Sig Dispense Refill   acetaminophen (TYLENOL) 500 MG tablet Take 500 mg by mouth every 6 (six) hours as needed for moderate pain.     aspirin EC 81 MG tablet Take 81 mg by mouth daily.      cetirizine (ZYRTEC) 10 MG tablet Take 1 tablet (10 mg total) by mouth daily. 30 tablet 1   Continuous Glucose Receiver (FREESTYLE LIBRE 3 READER) DEVI 1 each by Does not apply route 3 (three) times daily. Please use to check blood sugar three times daily. E11.69 1 each 0   Continuous Glucose Sensor (FREESTYLE LIBRE 3 SENSOR) MISC Please use to check blood sugar 3 times daily. Change sensors once every 14 days. 2 each 6   diclofenac Sodium (VOLTAREN) 1 % GEL Apply 4 g topically 4 (four) times daily. 100 g 1   fluticasone (FLONASE) 50 MCG/ACT nasal spray Place 2 sprays into both nostrils daily. 16 g 2    guaiFENesin-dextromethorphan (ROBITUSSIN DM) 100-10 MG/5ML syrup Take 5 mLs by mouth every 6 (six) hours as needed for cough. 118 mL 0   Insulin Pen Needle (B-D UF III MINI PEN NEEDLES) 31G X 5 MM MISC use as directed 4 (four) times daily - after meals and at bedtime. 100 each 3   metoCLOPramide (REGLAN) 5 MG tablet Take 1 tablet (5 mg total) by mouth every 8 (eight) hours as needed for nausea. 90 tablet 1   Misc. Devices MISC Blood  pressure monitor. Dx: Hypertension 1 each 0   atorvastatin (LIPITOR) 80 MG tablet Take 1 tablet (80 mg total) by mouth daily. 90 tablet 1   carvedilol (COREG) 3.125 MG tablet Take 1 tablet (3.125 mg total) by mouth 2 (two) times daily with a meal. 180 tablet 1   clopidogrel (PLAVIX) 75 MG tablet Take 1 tablet (75 mg total) by mouth daily. 90 tablet 1   famotidine (PEPCID) 20 MG tablet Take 1 tablet (20 mg total) by mouth 2 (two) times daily. 180 tablet 1   insulin glargine (LANTUS SOLOSTAR) 100 UNIT/ML Solostar Pen Inject 30 Units into the skin in the morning AND 22 Units every evening. 45 mL 1   losartan-hydrochlorothiazide (HYZAAR) 100-25 MG tablet Take 1 tablet by mouth daily. 90 tablet 1   tirzepatide (MOUNJARO) 7.5 MG/0.5ML Pen Inject 7.5 mg into the skin once a week. 6 mL 0   cholecalciferol (VITAMIN D3) 25 MCG (1000 UNIT) tablet Take 1 tablet (1,000 Units total) by mouth daily. (Patient not taking: Reported on 02/07/2023) 30 tablet 3   No facility-administered medications prior to visit.     ROS Review of Systems  Constitutional:  Negative for activity change and appetite change.  HENT:  Negative for sinus pressure and sore throat.   Eyes:  Positive for visual disturbance.  Respiratory:  Negative for chest tightness, shortness of breath and wheezing.   Cardiovascular:  Negative for chest pain and palpitations.  Gastrointestinal:  Negative for abdominal distention, abdominal pain and constipation.  Genitourinary: Negative.   Musculoskeletal:        See HPI   Psychiatric/Behavioral:  Negative for behavioral problems and dysphoric mood.     Objective:  BP 131/81   Pulse 76   Ht 5\' 6"  (1.676 m)   Wt 194 lb (88 kg)   SpO2 100%   BMI 31.31 kg/m      02/07/2023    2:32 PM 11/01/2022    3:33 PM 07/26/2022    2:59 PM  BP/Weight  Systolic BP 131 115 135  Diastolic BP 81 76 78  Wt. (Lbs) 194 197 199.4  BMI 31.31 kg/m2 31.8 kg/m2 32.18 kg/m2      Physical Exam Constitutional:      Appearance: She is well-developed.  Cardiovascular:     Rate and Rhythm: Normal rate.     Heart sounds: Normal heart sounds. No murmur heard. Pulmonary:     Effort: Pulmonary effort is normal.     Breath sounds: Normal breath sounds. No wheezing or rales.  Chest:     Chest wall: No tenderness.  Abdominal:     General: Bowel sounds are normal. There is no distension.     Palpations: Abdomen is soft. There is no mass.     Tenderness: There is no abdominal tenderness.  Musculoskeletal:        General: Normal range of motion.     Right lower leg: No edema.     Left lower leg: No edema.     Comments: Slight flexion deformity of left ring finger which is not tender  Neurological:     Mental Status: She is alert and oriented to person, place, and time.  Psychiatric:        Mood and Affect: Mood normal.        Latest Ref Rng & Units 11/01/2022    4:28 PM 07/26/2022    4:25 PM 05/02/2022    3:28 PM  CMP  Glucose 70 - 99 mg/dL 161  86  222   BUN 8 - 27 mg/dL 24  32  39   Creatinine 0.57 - 1.00 mg/dL 1.19  1.47  8.29   Sodium 134 - 144 mmol/L 138  142  136   Potassium 3.5 - 5.2 mmol/L 3.7  4.1  4.6   Chloride 96 - 106 mmol/L 100  104  97   CO2 20 - 29 mmol/L 23  22  20    Calcium 8.7 - 10.3 mg/dL 9.5  9.7  56.2   Total Protein 6.0 - 8.5 g/dL 6.7  7.3  6.9   Total Bilirubin 0.0 - 1.2 mg/dL 0.2  0.2  0.3   Alkaline Phos 44 - 121 IU/L 105  98  120   AST 0 - 40 IU/L 23  17  14    ALT 0 - 32 IU/L 25  14  20      Lipid Panel     Component Value  Date/Time   CHOL 202 (H) 05/07/2019 1049   TRIG 122 05/07/2019 1049   HDL 46 05/07/2019 1049   CHOLHDL 4.4 05/07/2019 1049   CHOLHDL 2.7 01/04/2016 1138   VLDL 12 01/04/2016 1138   LDLCALC 134 (H) 05/07/2019 1049    CBC    Component Value Date/Time   WBC 9.7 10/19/2020 1649   WBC 9.6 02/13/2019 2030   RBC 3.71 (L) 10/19/2020 1649   RBC 3.14 (L) 02/13/2019 2030   HGB 11.5 10/19/2020 1649   HCT 33.4 (L) 10/19/2020 1649   PLT 508 (H) 10/19/2020 1649   MCV 90 10/19/2020 1649   MCH 31.0 10/19/2020 1649   MCH 30.6 02/13/2019 2030   MCHC 34.4 10/19/2020 1649   MCHC 32.9 02/13/2019 2030   RDW 11.8 10/19/2020 1649   LYMPHSABS 2.7 10/19/2020 1649   MONOABS 0.4 02/13/2019 2030   EOSABS 0.3 10/19/2020 1649   BASOSABS 0.1 10/19/2020 1649    Lab Results  Component Value Date   HGBA1C 8.2 (A) 02/07/2023    Assessment & Plan:      Type 2 Diabetes Mellitus A1c improved from 9.4 to 8.2. Patient reports constipation and abdominal discomfort with Mounjaro 7.5mg  weekly. Discussed options for alternative medications and constipation management Ozempic or Trulicity which she declines. -Continue Mounjaro 7.5mg  weekly for 4 more weeks as she just received a new refill. -Increase Mounjaro to 10mg  weekly after 4 weeks. -Continue Miralax daily for constipation. -Check A1c in 3 months. -Needs a microalbumin but she will not provide a urine specimen as she states the bathrooms are usually dirty and due to her blindness she would not trust anyone going in ahead of her to check for her.  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 No acute distress. -No intervention needed at this time.  General Health Maintenance -Continue current insulin regimen (30 units in the morning, 22 units in the evening). -Encouraged dietary modifications for better glycemic control. -Schedule follow-up with nephrologist.          Meds ordered this encounter  Medications   tirzepatide (MOUNJARO) 10 MG/0.5ML Pen    Sig: Inject 10 mg into the skin once a week.    Dispense:  6 mL    Refill:  6    Dispense after 02/27/2023   atorvastatin (LIPITOR) 80 MG tablet    Sig: Take 1 tablet (80 mg total) by mouth daily.    Dispense:  90 tablet    Refill:  1   carvedilol (COREG) 3.125 MG tablet    Sig: Take 1 tablet (3.125 mg total) by mouth 2 (two) times daily with a meal.    Dispense:  180 tablet    Refill:  1   clopidogrel (PLAVIX) 75 MG tablet    Sig: Take 1 tablet (75 mg total) by mouth daily.    Dispense:  90 tablet    Refill:  1   famotidine (PEPCID) 20 MG tablet    Sig: Take 1 tablet (20 mg total) by mouth 2 (two) times daily.    Dispense:  180 tablet    Refill:  1   insulin glargine (LANTUS SOLOSTAR) 100 UNIT/ML Solostar Pen    Sig: Inject 30 Units into the skin in the morning AND 22 Units every evening.    Dispense:  45 mL    Refill:  1   losartan-hydrochlorothiazide (HYZAAR) 100-25 MG tablet    Sig: Take 1 tablet by mouth daily.    Dispense:  90 tablet    Refill:  1    Follow-up: Return in about 3 months (around 05/08/2023) for Chronic medical conditions.       Hoy Register, MD, FAAFP. [facility] [place], [place] [phone]   02/07/2023, 2:54 PM

## 2023-02-09 ENCOUNTER — Ambulatory Visit: Payer: Self-pay

## 2023-02-10 NOTE — Patient Outreach (Signed)
  Care Coordination   Follow Up Visit Note   02/10/2023 Name: Adrienne [name] MRN: [id number] DOB: [birth year]  Adrienne [name] is a 68 y.o. year old female who sees Hoy Register, MD for primary care. I spoke with  Adrienne [name] by phone today.  What matters to the patients health and wellness today?  Patient would like to participate in the PREP program.     Goals Addressed               This Visit's Progress     Patient Stated     I want to improve my diabetes (pt-stated)   On track     Care Coordination Interventions: Evaluation of current treatment plan related to type 2 diabetes and patient's adherence to plan as established by provider Review of patient status, including review of consultants reports, relevant laboratory and other test results Reviewed medications with patient and discussed importance of medication adherence, patient denies having barriers to taking or refilling her medications  Educated patient about importance of dietary adherence, counseled on diabetic diet including using the plate method, portion control and implementing daily routine exercise regimen Educated patient about the PREP program through the [facility] and patient would like to participate Collaboration with Myles Lipps regarding patient's blindness and confirmed with Lora patient is still eligible to participate Sent in basket message to Dr. Alvis Lemmings requesting a PREP referral be placed in EPIC Discussed plans with patient for ongoing care coordination follow up and provided patient with direct contact information for nurse care coordinator Assessed patient's understanding of A1c goal: <7%  Lab Results  Component Value Date   HGBA1C 8.2 (A) 02/07/2023       Interventions Today    Flowsheet Row Most Recent Value  Chronic Disease   Chronic disease during today's visit Diabetes  General Interventions   General Interventions Discussed/Reviewed General Interventions Discussed,  General Interventions Reviewed, Doctor Visits, Labs, Communication with  Doctor Visits Discussed/Reviewed PCP, Doctor Visits Reviewed, Doctor Visits Discussed  Communication with RN, PCP/Specialists  [Dr. Alvis Lemmings,  Myles Lipps RN]  Exercise Interventions   Exercise Discussed/Reviewed Exercise Reviewed, Exercise Discussed, Physical Activity  Physical Activity Discussed/Reviewed Physical Activity Discussed, Physical Activity Reviewed, PREP, Types of exercise  Education Interventions   Education Provided Provided Education  Provided Verbal Education On When to see the doctor, Medication, Exercise, Labs, Mental Health/Coping with Illness  Labs Reviewed Hgb A1c  Mental Health Interventions   Mental Health Discussed/Reviewed Mental Health Discussed, Mental Health Reviewed, Coping Strategies  [patient established with Remigio Eisenmenger BSW]  Pharmacy Interventions   Pharmacy Dicussed/Reviewed Pharmacy Topics Reviewed, Pharmacy Topics Discussed, Medications and their functions          SDOH assessments and interventions completed:  No     Care Coordination Interventions:  Yes, provided   Follow up plan: Follow up call scheduled for 02/21/23 @1 :00 PM    Encounter Outcome:  Patient Visit Completed

## 2023-02-10 NOTE — Patient Instructions (Signed)
Visit Information  Thank you for taking time to visit with me today. Please don't hesitate to contact me if I can be of assistance to you.   Following are the goals we discussed today:   Goals Addressed               This Visit's Progress     Patient Stated     I want to improve my diabetes (pt-stated)   On track     Care Coordination Interventions: Evaluation of current treatment plan related to type 2 diabetes and patient's adherence to plan as established by provider Review of patient status, including review of consultants reports, relevant laboratory and other test results Reviewed medications with patient and discussed importance of medication adherence, patient denies having barriers to taking or refilling her medications  Educated patient about importance of dietary adherence, counseled on diabetic diet including using the plate method, portion control and implementing daily routine exercise regimen Educated patient about the PREP program through the [facility] and patient would like to participate Collaboration with Myles Lipps regarding patient's blindness and confirmed with Mervyn Gay patient is still eligible to participate Sent in basket message to Dr. Alvis Lemmings requesting a PREP referral be placed in EPIC Discussed plans with patient for ongoing care coordination follow up and provided patient with direct contact information for nurse care coordinator Assessed patient's understanding of A1c goal: <7%  Lab Results  Component Value Date   HGBA1C 8.2 (A) 02/07/2023           Our next appointment is by telephone on 02/21/23 at 1:00 PM  Please call the care guide team at [phone] if you need to cancel or reschedule your appointment.   If you are experiencing a Mental Health or Behavioral Health Crisis or need someone to talk to, please call 1-800-273-TALK (toll free, 24 hour hotline)  The patient verbalized understanding of instructions, educational materials, and care plan  provided today and DECLINED offer to receive copy of patient instructions, educational materials, and care plan.   Delsa Sale RN BSN CCM   [facility], [facility] Nurse Care Coordinator  Direct Dial: [phone] Website: [email or web address]

## 2023-02-14 ENCOUNTER — Other Ambulatory Visit: Payer: Self-pay | Admitting: Family Medicine

## 2023-02-14 DIAGNOSIS — E1169 Type 2 diabetes mellitus with other specified complication: Secondary | ICD-10-CM

## 2023-02-16 ENCOUNTER — Other Ambulatory Visit (HOSPITAL_COMMUNITY): Payer: Self-pay

## 2023-02-21 ENCOUNTER — Ambulatory Visit: Payer: Self-pay

## 2023-02-21 NOTE — Patient Outreach (Signed)
  Care Coordination   02/21/2023 Name: ORLANDRIA KISSNER MRN: [id number] DOB: Sep 14, 1955   Care Coordination Outreach Attempts:  An unsuccessful outreach was attempted for an appointment today.  Follow Up Plan:  Additional outreach attempts will be made to offer the patient complex care management information and services.   Encounter Outcome:  No Answer   Care Coordination Interventions:  No, not indicated    Clayborne Ly RN BSN CCM [place]  [facility], [facility] Nurse Care Coordinator  Direct Dial: [phone] Website: [email or web address]

## 2023-02-24 ENCOUNTER — Telehealth: Payer: Self-pay

## 2023-02-24 NOTE — Telephone Encounter (Signed)
 Called re: PREP program explained PREP, she wants to attend at [facility]; she is vision impaired and will need assistance; she understands and will work on finding family member/friend who can bring her and be stand by for using exercise equipment. She would like to attend March 3 class, every M/W 2:30-3:45; she will call me once she has someone to assist her and then will set up assessment visit.

## 2023-02-27 ENCOUNTER — Other Ambulatory Visit: Payer: Self-pay

## 2023-02-27 ENCOUNTER — Other Ambulatory Visit (HOSPITAL_COMMUNITY): Payer: Self-pay

## 2023-03-15 ENCOUNTER — Other Ambulatory Visit (HOSPITAL_COMMUNITY): Payer: Self-pay

## 2023-03-15 ENCOUNTER — Other Ambulatory Visit: Payer: Self-pay

## 2023-03-15 ENCOUNTER — Telehealth: Payer: Self-pay

## 2023-03-15 NOTE — Telephone Encounter (Signed)
 Called [id number] in next PREP class; has been able to arrange transportation, but does not have anyone who can attend with her to assist with mobility and her vision impairment; explained the class is a group setting and she needs to be able to function fairly independently or have someone participate with her. She will call me if she is able to secure a resource to attend the class.

## 2023-03-16 ENCOUNTER — Other Ambulatory Visit: Payer: Self-pay

## 2023-03-21 DIAGNOSIS — K219 Gastro-esophageal reflux disease without esophagitis: Secondary | ICD-10-CM | POA: Diagnosis not present

## 2023-03-21 DIAGNOSIS — E785 Hyperlipidemia, unspecified: Secondary | ICD-10-CM | POA: Diagnosis not present

## 2023-03-21 DIAGNOSIS — Z87891 Personal history of nicotine dependence: Secondary | ICD-10-CM | POA: Diagnosis not present

## 2023-03-21 DIAGNOSIS — Z7982 Long term (current) use of aspirin: Secondary | ICD-10-CM | POA: Diagnosis not present

## 2023-03-21 DIAGNOSIS — K5900 Constipation, unspecified: Secondary | ICD-10-CM | POA: Diagnosis not present

## 2023-03-21 DIAGNOSIS — Z794 Long term (current) use of insulin: Secondary | ICD-10-CM | POA: Diagnosis not present

## 2023-03-21 DIAGNOSIS — E1122 Type 2 diabetes mellitus with diabetic chronic kidney disease: Secondary | ICD-10-CM | POA: Diagnosis not present

## 2023-03-21 DIAGNOSIS — Z833 Family history of diabetes mellitus: Secondary | ICD-10-CM | POA: Diagnosis not present

## 2023-03-21 DIAGNOSIS — I129 Hypertensive chronic kidney disease with stage 1 through stage 4 chronic kidney disease, or unspecified chronic kidney disease: Secondary | ICD-10-CM | POA: Diagnosis not present

## 2023-03-21 DIAGNOSIS — N184 Chronic kidney disease, stage 4 (severe): Secondary | ICD-10-CM | POA: Diagnosis not present

## 2023-03-21 DIAGNOSIS — Z8249 Family history of ischemic heart disease and other diseases of the circulatory system: Secondary | ICD-10-CM | POA: Diagnosis not present

## 2023-03-21 DIAGNOSIS — E1143 Type 2 diabetes mellitus with diabetic autonomic (poly)neuropathy: Secondary | ICD-10-CM | POA: Diagnosis not present

## 2023-03-22 ENCOUNTER — Other Ambulatory Visit (HOSPITAL_COMMUNITY): Payer: Self-pay

## 2023-03-22 ENCOUNTER — Other Ambulatory Visit: Payer: Self-pay

## 2023-03-24 ENCOUNTER — Encounter: Payer: Medicare HMO | Admitting: Licensed Clinical Social Worker

## 2023-03-28 ENCOUNTER — Ambulatory Visit: Payer: Self-pay | Admitting: Licensed Clinical Social Worker

## 2023-03-28 NOTE — Patient Outreach (Signed)
 Care Coordination   Follow Up Visit Note   03/28/2023 Name: Adrienne [name] MRN: [id number] DOB: 1955/04/10  Adrienne [name] is a 68 y.o. year old female who sees Hoy Register, MD for primary care. I spoke with  Adrienne [name] by phone today.  What matters to the patients health and wellness today? SW assisted the patient with her OTC quarterly order form [facility].  She stated that she can get someone to take her to the store in the future to buy her things. I reassured her and encouraged her that if she has any problems, I told her to contact her PCP to have them to assist or to make a referral to [facility].    Goals Addressed             This Visit's Progress    COMPLETED: Care Coordination Activities       Care Coordination Interventions: SW assisted the patient with ordering some supplies from [facility]. SW encouraged the patient to look for a support ie. Family or Friend to assist her with her quarterly supply order.  SW will call the patient in 03/24/2023 to do her quarterly order.         SDOH assessments and interventions completed:  Yes  SDOH Interventions Today    Flowsheet Row Most Recent Value  SDOH Interventions   Food Insecurity Interventions Intervention Not Indicated  Housing Interventions Intervention Not Indicated  Transportation Interventions Intervention Not Indicated  Utilities Interventions Intervention Not Indicated        Care Coordination Interventions:  Yes, provided  Interventions Today    Flowsheet Row Most Recent Value  General Interventions   General Interventions Discussed/Reviewed General Interventions Reviewed, Community Resources  [SW assisted patient with her OTC order with her [employer] card]        Follow up plan: No further intervention required.   Encounter Outcome:  Patient Visit Completed   Jeanie Cooks, PhD [facility], [facility] Social Worker Direct Dial: [phone]   Fax: [phone]

## 2023-03-28 NOTE — Patient Instructions (Signed)
 Visit Information  Thank you for taking time to visit with me today. Please don't hesitate to contact me if I can be of assistance to you.   Following are the goals we discussed today:   Goals Addressed             This Visit's Progress    COMPLETED: Care Coordination Activities       Care Coordination Interventions: SW assisted the patient with ordering some supplies from [facility]. SW encouraged the patient to look for a support ie. Family or Friend to assist her with her quarterly supply order.  SW will call the patient in 03/24/2023 to do her quarterly order.         No future follow up needed, SW encouraged patient that if assistance is needed in the future to call the PCP to have a referral made to [facility]  Please call the care guide team at [phone] if you need to cancel or reschedule your appointment.   If you are experiencing a Mental Health or Behavioral Health Crisis or need someone to talk to, please call the Suicide and Crisis Lifeline: 988 go to [facility] [address] [place] [phone]) call 911  The patient verbalized understanding of instructions, educational materials, and care plan provided today and DECLINED offer to receive copy of patient instructions, educational materials, and care plan.   Jeanie Cooks, PhD [facility], [facility] Social Worker Direct Dial: [phone]  Fax: [phone]

## 2023-03-29 ENCOUNTER — Other Ambulatory Visit (HOSPITAL_BASED_OUTPATIENT_CLINIC_OR_DEPARTMENT_OTHER): Payer: Self-pay | Admitting: Pharmacist

## 2023-03-29 ENCOUNTER — Encounter: Payer: Self-pay | Admitting: Pharmacist

## 2023-03-29 DIAGNOSIS — Z794 Long term (current) use of insulin: Secondary | ICD-10-CM

## 2023-03-29 DIAGNOSIS — Z7985 Long-term (current) use of injectable non-insulin antidiabetic drugs: Secondary | ICD-10-CM

## 2023-03-29 DIAGNOSIS — E1169 Type 2 diabetes mellitus with other specified complication: Secondary | ICD-10-CM

## 2023-03-29 NOTE — Progress Notes (Cosign Needed Addendum)
 Pharmacy TNM Diabetes Measure Review  S:  Patient was identified in a report as being at risk for failing the True [place] Metric of A1c control (<8%) in [place] and African American patients. Last A1c was 8.2. Last PCP visit was 02/07/2023.  Call placed to patient to discuss diabetes control and medication management. Patient has been seen by the clinical pharmacist with most recent visit in 2021. I placed a call today to discuss DM. She was initially unavailable but later returned my call.  Of note, her A1c is trending down. She is in the process of titrating Mounjaro with Dr. Alvis Lemmings. Was recently increased to 10 mg weekly. She endorses some continued constipation and being wiped out for ~1 day post injection with the 10mg  dose. She has been without her Miralax but plans to restart this and give her body more time to get used to the 10 mg dose. She will let us know if she is unable to tolerate this moving forward. Of note, she picked up a 12-week supply of the 10mg  Mounjaro on 02/27/2023.  Current diabetes medications include: Lantus 30 units in the morning and 22 units in the evening, Mounjaro 10 mg weely Patient reports adherence to taking all medications as prescribed.   Insurance coverage: [employer]  O:  Lab Results  Component Value Date   HGBA1C 8.2 (A) 02/07/2023   There were no vitals filed for this visit.  Lipid Panel     Component Value Date/Time   CHOL 202 (H) 05/07/2019 1049   TRIG 122 05/07/2019 1049   HDL 46 05/07/2019 1049   CHOLHDL 4.4 05/07/2019 1049   CHOLHDL 2.7 01/04/2016 1138   VLDL 12 01/04/2016 1138   LDLCALC 134 (H) 05/07/2019 1049    Clinical Atherosclerotic Cardiovascular Disease (ASCVD): Yes  The ASCVD Risk score (Arnett DK, et al., 2019) failed to calculate for the following reasons:   Risk score cannot be calculated because patient has a medical history suggesting prior/existing ASCVD   Patient is participating in a Managed Medicaid Plan: No    A/P: Diabetes longstanding currently uncontrolled but improving. I did connect with her and she is still having some tolerability issues with the [facility]. She plans to resume Miralax to help with the constipation. She also wants to give the 10mg  dose more time so her body can adjust. Medication fills are current with her insulin and Mounjaro. I encouraged her to continue with this plan and call back with any continued side effects. At that time, we can return to the 7.5 mg weekly dose if needed. She is amenable to this and will call if needed.  -Next A1c anticipated 05/10/2023 with PCP.   Follow-up:  Pharmacist prn PCP clinic visit: 05/10/2023   Butch Penny, PharmD, BCACP, CPP Clinical Pharmacist [facility] [phone]

## 2023-04-04 ENCOUNTER — Ambulatory Visit: Payer: Self-pay

## 2023-04-04 NOTE — Patient Outreach (Signed)
 Care Coordination   04/04/2023 Name: Adrienne [name] MRN: [id number] DOB: 05-02-1955   Care Coordination Outreach Attempts:  An unsuccessful outreach was attempted for an appointment today.  Follow Up Plan:  Additional outreach attempts will be made to offer the patient complex care management information and services.   Encounter Outcome:  No Answer   Care Coordination Interventions:  No, not indicated    Delsa Sale RN BSN CCM [place]  [facility], [facility] Health Nurse Care Coordinator  Direct Dial: [phone] Website: [email or web address]

## 2023-04-12 ENCOUNTER — Other Ambulatory Visit: Payer: Self-pay

## 2023-04-12 ENCOUNTER — Other Ambulatory Visit (HOSPITAL_COMMUNITY): Payer: Self-pay

## 2023-05-10 ENCOUNTER — Encounter: Payer: Self-pay | Admitting: Family Medicine

## 2023-05-10 ENCOUNTER — Other Ambulatory Visit: Payer: Self-pay

## 2023-05-10 ENCOUNTER — Ambulatory Visit: Payer: Medicare HMO | Attending: Family Medicine | Admitting: Family Medicine

## 2023-05-10 VITALS — BP 121/77 | HR 80 | Ht 66.0 in | Wt 192.0 lb

## 2023-05-10 DIAGNOSIS — K219 Gastro-esophageal reflux disease without esophagitis: Secondary | ICD-10-CM | POA: Diagnosis not present

## 2023-05-10 DIAGNOSIS — Z1231 Encounter for screening mammogram for malignant neoplasm of breast: Secondary | ICD-10-CM

## 2023-05-10 DIAGNOSIS — E1122 Type 2 diabetes mellitus with diabetic chronic kidney disease: Secondary | ICD-10-CM

## 2023-05-10 DIAGNOSIS — E1169 Type 2 diabetes mellitus with other specified complication: Secondary | ICD-10-CM

## 2023-05-10 DIAGNOSIS — Z7985 Long-term (current) use of injectable non-insulin antidiabetic drugs: Secondary | ICD-10-CM

## 2023-05-10 DIAGNOSIS — R110 Nausea: Secondary | ICD-10-CM | POA: Diagnosis not present

## 2023-05-10 DIAGNOSIS — E785 Hyperlipidemia, unspecified: Secondary | ICD-10-CM | POA: Diagnosis not present

## 2023-05-10 DIAGNOSIS — H3411 Central retinal artery occlusion, right eye: Secondary | ICD-10-CM | POA: Diagnosis not present

## 2023-05-10 DIAGNOSIS — Z794 Long term (current) use of insulin: Secondary | ICD-10-CM | POA: Diagnosis not present

## 2023-05-10 DIAGNOSIS — E2839 Other primary ovarian failure: Secondary | ICD-10-CM | POA: Diagnosis not present

## 2023-05-10 DIAGNOSIS — I129 Hypertensive chronic kidney disease with stage 1 through stage 4 chronic kidney disease, or unspecified chronic kidney disease: Secondary | ICD-10-CM

## 2023-05-10 DIAGNOSIS — K5909 Other constipation: Secondary | ICD-10-CM | POA: Diagnosis not present

## 2023-05-10 DIAGNOSIS — Z7984 Long term (current) use of oral hypoglycemic drugs: Secondary | ICD-10-CM | POA: Diagnosis not present

## 2023-05-10 LAB — POCT GLYCOSYLATED HEMOGLOBIN (HGB A1C): HbA1c, POC (controlled diabetic range): 7 % (ref 0.0–7.0)

## 2023-05-10 MED ORDER — LOSARTAN POTASSIUM-HCTZ 100-25 MG PO TABS
1.0000 | ORAL_TABLET | Freq: Every day | ORAL | 1 refills | Status: DC
Start: 2023-05-10 — End: 2023-11-15
  Filled 2023-05-10 – 2023-05-31 (×2): qty 90, 90d supply, fill #0
  Filled 2023-08-21: qty 90, 90d supply, fill #1

## 2023-05-10 MED ORDER — FAMOTIDINE 20 MG PO TABS
20.0000 mg | ORAL_TABLET | Freq: Two times a day (BID) | ORAL | 1 refills | Status: DC
Start: 2023-05-10 — End: 2023-11-29
  Filled 2023-05-10 – 2023-07-15 (×2): qty 180, 90d supply, fill #0
  Filled 2023-10-10: qty 180, 90d supply, fill #1

## 2023-05-10 MED ORDER — CARVEDILOL 3.125 MG PO TABS
3.1250 mg | ORAL_TABLET | Freq: Two times a day (BID) | ORAL | 1 refills | Status: DC
  Filled 2023-05-10 – 2023-07-15 (×2): qty 180, 90d supply, fill #0
  Filled 2023-10-10: qty 180, 90d supply, fill #1

## 2023-05-10 MED ORDER — LANTUS SOLOSTAR 100 UNIT/ML ~~LOC~~ SOPN
PEN_INJECTOR | SUBCUTANEOUS | 1 refills | Status: DC
  Filled 2023-05-10: qty 45, 87d supply, fill #0

## 2023-05-10 MED ORDER — METOCLOPRAMIDE HCL 5 MG PO TABS
5.0000 mg | ORAL_TABLET | Freq: Three times a day (TID) | ORAL | 1 refills | Status: AC | PRN
Start: 2023-05-10 — End: ?
  Filled 2023-05-10 (×2): qty 90, 30d supply, fill #0
  Filled 2023-07-15: qty 90, 30d supply, fill #1

## 2023-05-10 MED ORDER — ATORVASTATIN CALCIUM 80 MG PO TABS
80.0000 mg | ORAL_TABLET | Freq: Every day | ORAL | 1 refills | Status: DC
Start: 2023-05-10 — End: 2023-11-15
  Filled 2023-05-10 – 2023-06-16 (×3): qty 90, 90d supply, fill #0
  Filled 2023-09-05: qty 90, 90d supply, fill #1

## 2023-05-10 MED ORDER — LACTULOSE 10 GM/15ML PO SOLN
10.0000 g | Freq: Three times a day (TID) | ORAL | 0 refills | Status: AC
Start: 2023-05-10 — End: ?
  Filled 2023-05-10 (×2): qty 473, 11d supply, fill #0

## 2023-05-10 MED ORDER — CLOPIDOGREL BISULFATE 75 MG PO TABS
75.0000 mg | ORAL_TABLET | Freq: Every day | ORAL | 1 refills | Status: DC
  Filled 2023-05-10 – 2023-07-15 (×2): qty 90, 90d supply, fill #0
  Filled 2023-10-10: qty 90, 90d supply, fill #1

## 2023-05-10 NOTE — Progress Notes (Signed)
 Subjective:  Patient ID: Adrienne [name], female    DOB: 1955/10/27  Age: 68 y.o. MRN: [id number]  CC: Medical Management of Chronic Issues (Constipation/Belching/Discuss mounjaro )     Discussed the use of AI scribe software for clinical note transcription with the patient, who gave verbal consent to proceed.  History of Present Illness Adrienne [name]  is a 68 y.o. 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 presents with severe constipation.  She reports that she ran out of her usual laxative, Clear Max, and has been unable to have a bowel movement despite taking magnesium citrate two days ago. She describes the stool as being "right there," but she has been unable to manually disimpact it. She has also tried using Fleet enemas without success. She reports that the constipation is causing discomfort and occasional side pain.  In addition to her constipation, Adrienne [name] discusses her diabetes management. Her A1c is currently at 7.0, which she is pleased with. She has been taking Mounjaro  at a higher dosage of 10 mg, which she reports leaves her feeling fatigued and possibly lightheaded the day after taking it. She is unsure whether to continue with this dosage or to decrease it.  She is also on Lantus .  She has not checked her sugar when she has experienced those feelings. Endorses adherence with her statin and her antihypertensive and her blood pressure is normal.  Adrienne [name] also mentions that she has not had her mammogram or bone density test this year. She expresses some reluctance about the mammogram due to discomfort but agrees to have both tests done. She also mentions that her sleep schedule has been off since the  time change, and she has been sleeping during the day and staying up at night.    Past Medical History:  Diagnosis Date   Blindness of both eyes    Cataract    Diabetes (HCC)    GERD (gastroesophageal reflux disease)    Glaucoma    Heart murmur    High cholesterol    Hypertension    Stroke (HCC) 2015   R eye    Past Surgical History:  Procedure Laterality Date   CATARACT EXTRACTION, BILATERAL     Laser eye surgery, cataract surgery bi-lat   CESAREAN SECTION     Twins, Breech   EYE SURGERY Bilateral 2012   LASER, CATARACT   TEE WITHOUT CARDIOVERSION N/A 12/31/2013   Procedure: TRANSESOPHAGEAL ECHOCARDIOGRAM (TEE);  Surgeon: Jessica Morn, MD;  Location: [facility] ENDOSCOPY;  Service: Cardiovascular;  Laterality: N/A;  H&P in file   TUBAL LIGATION Bilateral     Family History  Problem Relation Age of Onset   Dementia Mother    Diabetes Father    Hypertension Father    Hypertension Sister    Diabetes Sister    Diabetes Sister    Heart disease Sister    Colon polyps Daughter    Colon cancer Neg Hx    Esophageal cancer Neg Hx    Rectal cancer Neg Hx    Stomach cancer Neg Hx     Social History   Socioeconomic History   Marital status: Divorced    Spouse name: Not on file   Number of children: 3   Years of education:  12   Highest education level: 12th grade  Occupational History    Employer: [employer]  Tobacco Use   Smoking status: Former    Passive exposure: Past   Smokeless tobacco: Never   Tobacco comments:    QUIT IN 1987  Vaping Use   Vaping status: Never Used  Substance and Sexual Activity   Alcohol use: No    Alcohol/week: 0.0 standard drinks of alcohol    Comment: QUIT IN 1987   Drug use: No    Frequency: 2.0 times per week    Comment: QUIT 1980   Sexual activity: Not Currently  Other Topics Concern   Not on file  Social History Narrative   Patient is divorced with 3 children.   Patient is right handed.   Patient has 12 th grade  education.   Patient has not been drinking caffeine.   Social Drivers of Corporate investment banker Strain: Low Risk  (05/12/2022)   Overall Financial Resource Strain (CARDIA)    Difficulty of Paying Living Expenses: Not hard at all  Food Insecurity: No Food Insecurity (03/28/2023)   Hunger Vital Sign    Worried About Running Out of Food in the Last Year: Never true    Ran Out of Food in the Last Year: Never true  Transportation Needs: No Transportation Needs (03/28/2023)   PRAPARE - Administrator, Civil Service (Medical): No    Lack of Transportation (Non-Medical): No  Physical Activity: Inactive (05/12/2022)   Exercise Vital Sign    Days of Exercise per Week: 0 days    Minutes of Exercise per Session: 0 min  Stress: No Stress Concern Present (05/12/2022)   [employer] of Occupational Health - Occupational Stress Questionnaire    Feeling of Stress : Not at all  Social Connections: Moderately Integrated (03/01/2021)   Social Connection and Isolation Panel [NHANES]    Frequency of Communication with Friends and Family: More than three times a week    Frequency of Social Gatherings with Friends and Family: More than three times a week    Attends Religious Services: More than 4 times per year    Active Member of [employer] or Organizations: Yes    Attends Engineer, structural: More than 4 times per year    Marital Status: Divorced    Allergies  Allergen Reactions   Codeine Itching   Hydrocodone Itching   Oxycodone Itching   Penicillins Itching   Clindamycin/Lincomycin Itching   Niacin And Related Itching   Penicillin G Itching   Niacin Itching and Anxiety    Facial flushing    Outpatient Medications Prior to Visit  Medication Sig Dispense Refill   acetaminophen  (TYLENOL ) 500 MG tablet Take 500 mg by mouth every 6 (six) hours as needed for moderate pain.     aspirin  EC 81 MG tablet Take 81 mg by mouth daily.      cetirizine  (ZYRTEC ) 10 MG tablet Take 1  tablet (10 mg total) by mouth daily. 30 tablet 1   Continuous Glucose Receiver (FREESTYLE LIBRE 3 READER) DEVI 1 each by Does not apply route 3 (three) times daily. Please use to check blood sugar three times daily. E11.69 1 each 0   Continuous Glucose Sensor (FREESTYLE LIBRE 3 SENSOR) MISC Please use to check blood sugar 3 times daily. Change sensors once every 14 days. 2 each 6   diclofenac  Sodium (VOLTAREN ) 1 % GEL Apply 4 g topically 4 (four) times daily. 100 g  1   fluticasone  (FLONASE ) 50 MCG/ACT nasal spray Place 2 sprays into both nostrils daily. 16 g 2   Insulin  Pen Needle (B-D UF III MINI PEN NEEDLES) 31G X 5 MM MISC use as directed 4 (four) times daily - after meals and at bedtime. 100 each 3   Misc. Devices MISC Blood pressure monitor. Dx: Hypertension 1 each 0   tirzepatide  (MOUNJARO ) 10 MG/0.5ML Pen Inject 10 mg into the skin once a week. 6 mL 6   atorvastatin  (LIPITOR) 80 MG tablet Take 1 tablet (80 mg total) by mouth daily. 90 tablet 1   carvedilol  (COREG ) 3.125 MG tablet Take 1 tablet (3.125 mg total) by mouth 2 (two) times daily with a meal. 180 tablet 1   clopidogrel  (PLAVIX ) 75 MG tablet Take 1 tablet (75 mg total) by mouth daily. 90 tablet 1   famotidine  (PEPCID ) 20 MG tablet Take 1 tablet (20 mg total) by mouth 2 (two) times daily. 180 tablet 1   insulin  glargine (LANTUS  SOLOSTAR) 100 UNIT/ML Solostar Pen Inject 30 Units into the skin in the morning AND 22 Units every evening. 45 mL 1   losartan -hydrochlorothiazide  (HYZAAR ) 100-25 MG tablet Take 1 tablet by mouth daily. 90 tablet 1   metoCLOPramide  (REGLAN ) 5 MG tablet Take 1 tablet (5 mg total) by mouth every 8 (eight) hours as needed for nausea. 90 tablet 1   cholecalciferol (VITAMIN D3) 25 MCG (1000 UNIT) tablet Take 1 tablet (1,000 Units total) by mouth daily. (Patient not taking: Reported on 05/10/2023) 30 tablet 3   guaiFENesin -dextromethorphan  (ROBITUSSIN DM) 100-10 MG/5ML syrup Take 5 mLs by mouth every 6 (six) hours as  needed for cough. (Patient not taking: Reported on 05/10/2023) 118 mL 0   No facility-administered medications prior to visit.     ROS Review of Systems  Constitutional:  Negative for activity change and appetite change.  HENT:  Negative for sinus pressure and sore throat.   Eyes:  Positive for visual disturbance.  Respiratory:  Negative for chest tightness, shortness of breath and wheezing.   Cardiovascular:  Negative for chest pain and palpitations.  Gastrointestinal:  Positive for constipation. Negative for abdominal distention and abdominal pain.  Genitourinary: Negative.   Musculoskeletal: Negative.   Psychiatric/Behavioral:  Negative for behavioral problems and dysphoric mood.      Objective:  BP 121/77   Pulse 80   Ht 5\' 6"  (1.676 m)   Wt 192 lb (87.1 kg)   SpO2 99%   BMI 30.99 kg/m      05/10/2023    2:37 PM 02/07/2023    2:32 PM 11/01/2022    3:33 PM  BP/Weight  Systolic BP 121 131 115  Diastolic BP 77 81 76  Wt. (Lbs) 192 194 197  BMI 30.99 kg/m2 31.31 kg/m2 31.8 kg/m2    Wt Readings from Last 3 Encounters:  05/10/23 192 lb (87.1 kg)  02/07/23 194 lb (88 kg)  11/01/22 197 lb (89.4 kg)     Physical Exam Constitutional:      Appearance: She is well-developed.  Cardiovascular:     Rate and Rhythm: Normal rate.     Heart sounds: Normal heart sounds. No murmur heard. Pulmonary:     Effort: Pulmonary effort is normal.     Breath sounds: Normal breath sounds. No wheezing or rales.  Chest:     Chest wall: No tenderness.  Abdominal:     General: Bowel sounds are normal. There is no distension.     Palpations: Abdomen  is soft. There is no mass.     Tenderness: There is no abdominal tenderness.  Musculoskeletal:        General: Normal range of motion.     Right lower leg: No edema.     Left lower leg: No edema.  Neurological:     Mental Status: She is alert and oriented to person, place, and time.  Psychiatric:        Mood and Affect: Mood normal.        Latest Ref Rng & Units 11/01/2022    4:28 PM 07/26/2022    4:25 PM 05/02/2022    3:28 PM  CMP  Glucose 70 - 99 mg/dL 161  86  096   BUN 8 - 27 mg/dL 24  32  39   Creatinine 0.57 - 1.00 mg/dL 0.45  4.09  8.11   Sodium 134 - 144 mmol/L 138  142  136   Potassium 3.5 - 5.2 mmol/L 3.7  4.1  4.6   Chloride 96 - 106 mmol/L 100  104  97   CO2 20 - 29 mmol/L 23  22  20    Calcium  8.7 - 10.3 mg/dL 9.5  9.7  91.4   Total Protein 6.0 - 8.5 g/dL 6.7  7.3  6.9   Total Bilirubin 0.0 - 1.2 mg/dL 0.2  0.2  0.3   Alkaline Phos 44 - 121 IU/L 105  98  120   AST 0 - 40 IU/L 23  17  14    ALT 0 - 32 IU/L 25  14  20      Lipid Panel     Component Value Date/Time   CHOL 202 (H) 05/07/2019 1049   TRIG 122 05/07/2019 1049   HDL 46 05/07/2019 1049   CHOLHDL 4.4 05/07/2019 1049   CHOLHDL 2.7 01/04/2016 1138   VLDL 12 01/04/2016 1138   LDLCALC 134 (H) 05/07/2019 1049    CBC    Component Value Date/Time   WBC 9.7 10/19/2020 1649   WBC 9.6 02/13/2019 2030   RBC 3.71 (L) 10/19/2020 1649   RBC 3.14 (L) 02/13/2019 2030   HGB 11.5 10/19/2020 1649   HCT 33.4 (L) 10/19/2020 1649   PLT 508 (H) 10/19/2020 1649   MCV 90 10/19/2020 1649   MCH 31.0 10/19/2020 1649   MCH 30.6 02/13/2019 2030   MCHC 34.4 10/19/2020 1649   MCHC 32.9 02/13/2019 2030   RDW 11.8 10/19/2020 1649   LYMPHSABS 2.7 10/19/2020 1649   MONOABS 0.4 02/13/2019 2030   EOSABS 0.3 10/19/2020 1649   BASOSABS 0.1 10/19/2020 1649    Lab Results  Component Value Date   HGBA1C 7.0 05/10/2023       Assessment & Plan Type 2 Diabetes Mellitus A1c at 7.0 indicates good control. Fatigue and lightheadedness post-Tirzepatide . - Continue Tirzepatide  10 mg weekly. - Monitor blood glucose, especially during symptoms. - Reduce Insulin  glargine by 2 units bid if hypoglycemia occurs. - Check blood glucose at home when symptomatic. -Counseled on Diabetic diet, my plate method, 782 minutes of moderate intensity exercise/week Blood sugar  logs with fasting goals of 80-120 mg/dl, random of less than 956 and in the event of sugars less than 60 mg/dl or greater than 213 mg/dl encouraged to notify the clinic. Advised on the need for annual eye exams, annual foot exams, Pneumonia vaccine.   Constipation Severe constipation with failed disimpaction. Hard stool possibly impacted. Lactulose  recommended. - Prescribe lactulose  up to three times daily until bowel movement. - Consider  prune juice as alternative or adjunct. - Resume Clear Max post-bowel movement. -Counseled on increasing fiber intake, fruits and vegetable, limit intake of foods like cheese, white bread, white rice   Hyperlipidemia Due for lipid panel. Last meal of about 5 hours ago may affect triglycerides. - Order lipid panel, note recent food intake. - Continue statin  Central retinal artery occlusion of right eye, blindness of left eye She has been on chronic Plavix  and aspirin  by ophthalmologist since then Requires assistance with tasks due to visual impairment. - Ensure accommodations during appointments.   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      Meds ordered this encounter  Medications   atorvastatin  (LIPITOR) 80 MG tablet    Sig: Take 1 tablet (80 mg total) by mouth daily.    Dispense:  90 tablet    Refill:  1   carvedilol  (COREG ) 3.125 MG tablet    Sig: Take 1 tablet (3.125 mg total) by mouth 2 (two) times daily with a meal.    Dispense:  180 tablet    Refill:  1   clopidogrel  (PLAVIX ) 75 MG tablet    Sig: Take 1 tablet (75 mg total) by mouth daily.    Dispense:  90 tablet    Refill:  1   famotidine  (PEPCID ) 20 MG tablet    Sig: Take 1  tablet (20 mg total) by mouth 2 (two) times daily.    Dispense:  180 tablet    Refill:  1   insulin  glargine (LANTUS  SOLOSTAR) 100 UNIT/ML Solostar Pen    Sig: Inject 30 Units into the skin in the morning AND 22 Units every evening.    Dispense:  45 mL    Refill:  1   losartan -hydrochlorothiazide  (HYZAAR ) 100-25 MG tablet    Sig: Take 1 tablet by mouth daily.    Dispense:  90 tablet    Refill:  1   metoCLOPramide  (REGLAN ) 5 MG tablet    Sig: Take 1 tablet (5 mg total) by mouth every 8 (eight) hours as needed for nausea.    Dispense:  90 tablet    Refill:  1   lactulose  (CHRONULAC ) 10 GM/15ML solution    Sig: Take 15 mLs (10 g total) by mouth 3 (three) times daily.    Dispense:  473 mL    Refill:  0    Follow-up: Return in about 6 months (around 11/09/2023) for Chronic medical conditions.       Joaquin Mulberry, MD, FAAFP. [facility] [place], [place] [phone]   05/10/2023, 5:27 PM

## 2023-05-11 ENCOUNTER — Other Ambulatory Visit (HOSPITAL_COMMUNITY): Payer: Self-pay

## 2023-05-11 LAB — CMP14+EGFR
ALT: 25 IU/L (ref 0–32)
AST: 17 IU/L (ref 0–40)
Albumin: 4.2 g/dL (ref 3.9–4.9)
Alkaline Phosphatase: 122 IU/L — ABNORMAL HIGH (ref 44–121)
BUN/Creatinine Ratio: 11 — ABNORMAL LOW (ref 12–28)
BUN: 23 mg/dL (ref 8–27)
Bilirubin Total: 0.4 mg/dL (ref 0.0–1.2)
CO2: 22 mmol/L (ref 20–29)
Calcium: 9.6 mg/dL (ref 8.7–10.3)
Chloride: 102 mmol/L (ref 96–106)
Creatinine, Ser: 2.01 mg/dL — ABNORMAL HIGH (ref 0.57–1.00)
Globulin, Total: 2.6 g/dL (ref 1.5–4.5)
Glucose: 154 mg/dL — ABNORMAL HIGH (ref 70–99)
Potassium: 4.6 mmol/L (ref 3.5–5.2)
Sodium: 141 mmol/L (ref 134–144)
Total Protein: 6.8 g/dL (ref 6.0–8.5)
eGFR: 27 mL/min/{1.73_m2} — ABNORMAL LOW (ref >59)

## 2023-05-11 LAB — LP+NON-HDL CHOLESTEROL
Cholesterol, Total: 148 mg/dL (ref 100–199)
HDL: 45 mg/dL (ref >39)
LDL Chol Calc (NIH): 78 mg/dL (ref 0–99)
Total Non-HDL-Chol (LDL+VLDL): 103 mg/dL (ref 0–129)
Triglycerides: 146 mg/dL (ref 0–149)
VLDL Cholesterol Cal: 25 mg/dL (ref 5–40)

## 2023-05-12 ENCOUNTER — Telehealth: Payer: Self-pay

## 2023-05-12 NOTE — Telephone Encounter (Signed)
 Patient states that she has used the lactulose  since her last visit and she has only had 1 bowl movement and now she states that she is having rectal pain from straining.

## 2023-05-15 NOTE — Telephone Encounter (Signed)
 Reached out to pt to go over provider response. Pt states she took Dulcolax Saturday night since taking it she has been using the bathroom all day Sunday. Pt states she doesn't know if she is using the bathroom on her own. Pt states she will see how she feels today and tomorrow and if she doesn't feel better she will go to the ED

## 2023-05-15 NOTE — Telephone Encounter (Signed)
 If she is using Lactulose  three times daily and is still constipated then she will need to go to the ED. Thanks

## 2023-05-16 ENCOUNTER — Telehealth: Payer: Self-pay

## 2023-05-16 ENCOUNTER — Ambulatory Visit: Payer: Medicare HMO | Attending: Family Medicine

## 2023-05-16 VITALS — Ht 66.0 in | Wt 192.0 lb

## 2023-05-16 DIAGNOSIS — Z0000 Encounter for general adult medical examination without abnormal findings: Secondary | ICD-10-CM

## 2023-05-16 DIAGNOSIS — Z13820 Encounter for screening for osteoporosis: Secondary | ICD-10-CM

## 2023-05-16 DIAGNOSIS — Z1239 Encounter for other screening for malignant neoplasm of breast: Secondary | ICD-10-CM

## 2023-05-16 NOTE — Patient Instructions (Signed)
 Ms. Blehm , Thank you for taking time to come for your Medicare Wellness Visit. I appreciate your ongoing commitment to your health goals. Please review the following plan we discussed and let me know if I can assist you in the future.   Referrals/Orders/Follow-Ups/Clinician Recommendations: Yes, keep maintaining your health by keeping your appointments with Dr. Enobong Newlin and any specialists that you may see.  Call us  if you need anything.  Have a great year!!!!  This is a list of the screening recommended for you and due dates:  Health Maintenance  Topic Date Due   DEXA scan (bone density measurement)  Never done   Mammogram  04/09/2022   COVID-19 Vaccine (4 - 2024-25 season) 09/18/2022   Pneumonia Vaccine (3 of 3 - PPSV23, PCV20 or PCV21) 05/09/2024*   Flu Shot  08/18/2023   Yearly kidney health urinalysis for diabetes  11/01/2023   Complete foot exam   11/01/2023   Hemoglobin A1C  11/09/2023   Yearly kidney function blood test for diabetes  05/09/2024   Medicare Annual Wellness Visit  05/15/2024   DTaP/Tdap/Td vaccine (2 - Td or Tdap) 12/14/2025   Colon Cancer Screening  03/09/2032   Hepatitis C Screening  Completed   Zoster (Shingles) Vaccine  Completed   HPV Vaccine  Aged Out   Meningitis B Vaccine  Aged Out   Eye exam for diabetics  Discontinued  *Topic was postponed. The date shown is not the original due date.    Advanced directives: (Declined) Advance directive discussed with you today. Even though you declined this today, please call our office should you change your mind, and we can give you the proper paperwork for you to fill out.  Next Medicare Annual Wellness Visit scheduled for next year: Yes

## 2023-05-16 NOTE — Progress Notes (Addendum)
 Because this visit was a virtual/telehealth visit,  certain criteria was not obtained, such a blood pressure, CBG if applicable, and timed get up and go. Any medications not marked as "taking" were not mentioned during the medication reconciliation part of the visit. Any vitals not documented were not able to be obtained due to this being a telehealth visit or patient was unable to self-report a recent blood pressure reading due to a lack of equipment at home via telehealth. Vitals that have been documented are verbally provided by the patient.   Subjective:   Adrienne [name] is a 68 y.o. who presents for a Medicare Wellness preventive visit.  Visit Complete: Virtual I connected with  Adrienne [name] on 05/16/23 by a audio enabled telemedicine application and verified that I am speaking with the correct person using two identifiers.  Patient Location: Home  Provider Location: Office/Clinic  I discussed the limitations of evaluation and management by telemedicine. The patient expressed understanding and agreed to proceed.  Vital Signs: Because this visit was a virtual/telehealth visit, some criteria may be missing or patient reported. Any vitals not documented were not able to be obtained and vitals that have been documented are patient reported.  VideoDeclined- This patient declined Librarian, academic. Therefore the visit was completed with audio only.  Persons Participating in Visit: Patient.  AWV Questionnaire: No: Patient Medicare AWV questionnaire was not completed prior to this visit.  Cardiac Risk Factors include: advanced age (>3men, >77 women);diabetes mellitus;dyslipidemia;family history of premature cardiovascular disease;hypertension;obesity (BMI >30kg/m2);sedentary lifestyle     Objective:    Today's Vitals   05/16/23 1154  Weight: 192 lb (87.1 kg)  Height: 5\' 6"  (1.676 m)  PainSc: 0-No pain   Body mass index is 30.99 kg/m.      05/16/2023   11:57 AM 05/12/2022   12:32 PM 09/10/2021    1:54 PM 07/16/2020    2:37 PM 04/03/2019    1:07 PM 02/13/2019    7:59 PM 08/26/2018   12:17 AM  Advanced Directives  Does Patient Have a Medical Advance Directive? No No No No Yes No No  Type of Advance Directive     Out of facility DNR (pink MOST or yellow form)    Would patient like information on creating a medical advance directive? No - Patient declined  No - Patient declined No - Patient declined  No - Patient declined No - Guardian declined    Current Medications (verified) Outpatient Encounter Medications as of 05/16/2023  Medication Sig   acetaminophen  (TYLENOL ) 500 MG tablet Take 500 mg by mouth every 6 (six) hours as needed for moderate pain.   aspirin  EC 81 MG tablet Take 81 mg by mouth daily.    atorvastatin  (LIPITOR) 80 MG tablet Take 1 tablet (80 mg total) by mouth daily.   carvedilol  (COREG ) 3.125 MG tablet Take 1 tablet (3.125 mg total) by mouth 2 (two) times daily with a meal.   cetirizine  (ZYRTEC ) 10 MG tablet Take 1 tablet (10 mg total) by mouth daily.   cholecalciferol (VITAMIN D3) 25 MCG (1000 UNIT) tablet Take 1 tablet (1,000 Units total) by mouth daily. (Patient not taking: Reported on 05/10/2023)   clopidogrel  (PLAVIX ) 75 MG tablet Take 1 tablet (75 mg total) by mouth daily.   Continuous Glucose Receiver (FREESTYLE LIBRE 3 READER) DEVI 1 each by Does not apply route 3 (three) times daily. Please use to check blood sugar three times daily. E11.69   Continuous Glucose  Sensor (FREESTYLE LIBRE 3 SENSOR) MISC Please use to check blood sugar 3 times daily. Change sensors once every 14 days.   diclofenac  Sodium (VOLTAREN ) 1 % GEL Apply 4 g topically 4 (four) times daily.   famotidine  (PEPCID ) 20 MG tablet Take 1 tablet (20 mg total) by mouth 2 (two) times daily.   fluticasone  (FLONASE ) 50 MCG/ACT nasal spray Place 2 sprays into both nostrils daily.   guaiFENesin -dextromethorphan  (ROBITUSSIN DM) 100-10 MG/5ML syrup Take 5  mLs by mouth every 6 (six) hours as needed for cough. (Patient not taking: Reported on 05/10/2023)   insulin  glargine (LANTUS  SOLOSTAR) 100 UNIT/ML Solostar Pen Inject 30 Units into the skin in the morning AND 22 Units every evening.   Insulin  Pen Needle (B-D UF III MINI PEN NEEDLES) 31G X 5 MM MISC use as directed 4 (four) times daily - after meals and at bedtime.   lactulose  (CHRONULAC ) 10 GM/15ML solution Take 15 mLs (10 g total) by mouth 3 (three) times daily.   losartan -hydrochlorothiazide  (HYZAAR ) 100-25 MG tablet Take 1 tablet by mouth daily.   metoCLOPramide  (REGLAN ) 5 MG tablet Take 1 tablet (5 mg total) by mouth every 8 (eight) hours as needed for nausea.   Misc. Devices MISC Blood pressure monitor. Dx: Hypertension   tirzepatide  (MOUNJARO ) 10 MG/0.5ML Pen Inject 10 mg into the skin once a week.   No facility-administered encounter medications on file as of 05/16/2023.    Allergies (verified) Codeine, Hydrocodone, Oxycodone, Penicillins, Clindamycin/lincomycin, Niacin and related, Penicillin g, and Niacin   History: Past Medical History:  Diagnosis Date   Blindness of both eyes    Cataract    Diabetes (HCC)    GERD (gastroesophageal reflux disease)    Glaucoma    Heart murmur    High cholesterol    Hypertension    Stroke (HCC) 2015   R eye   Past Surgical History:  Procedure Laterality Date   CATARACT EXTRACTION, BILATERAL     Laser eye surgery, cataract surgery bi-lat   CESAREAN SECTION     Twins, Breech   EYE SURGERY Bilateral 2012   LASER, CATARACT   TEE WITHOUT CARDIOVERSION N/A 12/31/2013   Procedure: TRANSESOPHAGEAL ECHOCARDIOGRAM (TEE);  Surgeon: Jessica Morn, MD;  Location: [facility] ENDOSCOPY;  Service: Cardiovascular;  Laterality: N/A;  H&P in file   TUBAL LIGATION Bilateral    Family History  Problem Relation Age of Onset   Dementia Mother    Diabetes Father    Hypertension Father    Hypertension Sister    Diabetes Sister    Diabetes Sister    Heart  disease Sister    Colon polyps Daughter    Colon cancer Neg Hx    Esophageal cancer Neg Hx    Rectal cancer Neg Hx    Stomach cancer Neg Hx    Social History   Socioeconomic History   Marital status: Divorced    Spouse name: Not on file   Number of children: 3   Years of education: 12   Highest education level: 12th grade  Occupational History    Employer: [employer]  Tobacco Use   Smoking status: Former    Passive exposure: Past   Smokeless tobacco: Never   Tobacco comments:    QUIT IN 1987  Vaping Use   Vaping status: Never Used  Substance and Sexual Activity   Alcohol use: No    Alcohol/week: 0.0 standard drinks of alcohol    Comment: QUIT IN 1987   Drug  use: No    Frequency: 2.0 times per week    Comment: QUIT 1980   Sexual activity: Not Currently  Other Topics Concern   Not on file  Social History Narrative   Patient is divorced with 3 children.   Patient is right handed.   Patient has 12 th grade education.   Patient has not been drinking caffeine.   Social Drivers of Corporate investment banker Strain: Low Risk  (05/16/2023)   Overall Financial Resource Strain (CARDIA)    Difficulty of Paying Living Expenses: Not hard at all  Food Insecurity: No Food Insecurity (05/16/2023)   Hunger Vital Sign    Worried About Running Out of Food in the Last Year: Never true    Ran Out of Food in the Last Year: Never true  Transportation Needs: No Transportation Needs (05/16/2023)   PRAPARE - Administrator, Civil Service (Medical): No    Lack of Transportation (Non-Medical): No  Physical Activity: Inactive (05/16/2023)   Exercise Vital Sign    Days of Exercise per Week: 0 days    Minutes of Exercise per Session: 0 min  Stress: No Stress Concern Present (05/16/2023)   [employer] of Occupational Health - Occupational Stress Questionnaire    Feeling of Stress : Not at all  Social Connections: Moderately Integrated (05/16/2023)   Social Connection  and Isolation Panel [NHANES]    Frequency of Communication with Friends and Family: More than three times a week    Frequency of Social Gatherings with Friends and Family: More than three times a week    Attends Religious Services: More than 4 times per year    Active Member of [employer] or Organizations: Yes    Attends Engineer, structural: More than 4 times per year    Marital Status: Divorced    Tobacco Counseling Counseling given: Not Answered Tobacco comments: QUIT IN 1987    Clinical Intake:  Pre-visit preparation completed: Yes  Pain : No/denies pain Pain Score: 0-No pain     BMI - recorded: 30.99 Nutritional Status: BMI > 30  Obese Nutritional Risks: None Diabetes: Yes CBG done?: No Did pt. bring in CBG monitor from home?: No  Lab Results  Component Value Date   HGBA1C 7.0 05/10/2023   HGBA1C 8.2 (A) 02/07/2023   HGBA1C 9.4 (A) 11/01/2022     How often do you need to have someone help you when you read instructions, pamphlets, or other written materials from your doctor or pharmacy?: 1 - Never What is the last grade level you completed in school?: 13 YEARS  Interpreter Needed?: No  Information entered by :: Mahonri Seiden N. Daleyza Gadomski, LPN.   Activities of Daily Living     05/16/2023   12:00 PM  In your present state of health, do you have any difficulty performing the following activities:  Hearing? 0  Vision? 1  Difficulty concentrating or making decisions? 0  Walking or climbing stairs? 0  Dressing or bathing? 0  Doing errands, shopping? 0  Preparing Food and eating ? N  Using the Toilet? N  In the past six months, have you accidently leaked urine? N  Do you have problems with loss of bowel control? N  Managing your Medications? N  Managing your Finances? N  Housekeeping or managing your Housekeeping? N    Patient Care Team: Joaquin Mulberry, MD as PCP - General (Family Medicine) Nathen Balder, Skeeter Dukes, RN as VBCI Care Management Little, Skeeter Dukes,  RN  Indicate any recent Medical Services you may have received from other than [facility] providers in the past year (date may be approximate).     Assessment:   This is a routine wellness examination for Adrienne [name].  Hearing/Vision screen Hearing Screening - Comments:: Denies hearing difficulties.  Vision Screening - Comments:: Patient is blind and goes to [facility].   Goals Addressed             This Visit's Progress    Client understands the importance of follow-up with providers by attending scheduled visits         Depression Screen     05/16/2023   11:57 AM 05/12/2022   12:32 PM 01/26/2022    2:46 PM 09/10/2021    1:55 PM 04/20/2021    3:21 PM 03/01/2021   12:22 PM 11/18/2020    9:28 AM  PHQ 2/9 Scores  PHQ - 2 Score 1 0  0 0 0 1  PHQ- 9 Score 10    6  8   Exception Documentation   Patient refusal        Fall Risk     05/16/2023   11:57 AM 05/10/2023    2:38 PM 02/07/2023    2:33 PM 11/01/2022    3:35 PM 05/12/2022   12:32 PM  Fall Risk   Falls in the past year? 0 0 0 0 0  Number falls in past yr: 0 0 0 0 0  Injury with Fall? 0 0 0 0 0  Risk for fall due to : No Fall Risks Impaired vision No Fall Risks Impaired vision Impaired vision;Medication side effect  Follow up Falls prevention discussed;Falls evaluation completed Falls evaluation completed Falls evaluation completed Falls evaluation completed Falls prevention discussed;Education provided;Falls evaluation completed    MEDICARE RISK AT HOME:  Medicare Risk at Home Any stairs in or around the home?: Yes If so, are there any without handrails?: No Home free of loose throw rugs in walkways, pet beds, electrical cords, etc?: Yes Adequate lighting in your home to reduce risk of falls?: Yes Life alert?: No Use of a cane, walker or w/c?: Yes Grab bars in the bathroom?: No Shower chair or bench in shower?: No Elevated toilet seat or a handicapped toilet?: Yes  TIMED UP AND GO:  Was the test performed?   No  Cognitive Function: 6CIT completed    05/16/2023   12:15 PM 09/10/2021    1:57 PM 07/16/2020    2:39 PM  MMSE - Mini Mental State Exam  Not completed: Unable to complete    Orientation to time  5 5  Orientation to Place  5 5  Registration  3 3  Attention/ Calculation  5 5  Recall  3 3  Language- name 2 objects  2 2  Language- repeat  1 1  Language- follow 3 step command  3 3  Language- read & follow direction  1 1  Write a sentence  1 0  Copy design  1 0  Total score  30 28        05/16/2023   11:59 AM 05/12/2022   12:35 PM  6CIT Screen  What Year? 0 points 0 points  What month? 0 points 0 points  What time? 0 points 0 points  Count back from 20 0 points 0 points  Months in reverse 0 points 0 points  Repeat phrase 0 points 0 points  Total Score 0 points 0 points    Immunizations Immunization History  Administered Date(s) Administered   Fluad Quad(high Dose 65+) 11/22/2021   Fluad Trivalent(High Dose 65+) 11/01/2022   Influenza,inj,Quad PF,6+ Mos 10/05/2015, 10/18/2016, 10/04/2017, 10/21/2019, 10/19/2020   Influenza-Unspecified 11/02/2013, 11/14/2014   PFIZER(Purple Top)SARS-COV-2 Vaccination 05/20/2019, 06/10/2019, 01/04/2020   Pneumococcal Conjugate-13 10/21/2019   Pneumococcal Polysaccharide-23 11/04/2014   Pneumococcal-Unspecified 11/14/2014   Tdap 12/15/2015   Zoster Recombinant(Shingrix ) 01/26/2022, 05/02/2022    Screening Tests Health Maintenance  Topic Date Due   DEXA SCAN  Never done   MAMMOGRAM  04/09/2022   COVID-19 Vaccine (4 - 2024-25 season) 09/18/2022   Pneumonia Vaccine 57+ Years old (3 of 3 - PPSV23, PCV20 or PCV21) 05/09/2024 (Originally 12/16/2019)   INFLUENZA VACCINE  08/18/2023   Diabetic kidney evaluation - Urine ACR  11/01/2023   FOOT EXAM  11/01/2023   HEMOGLOBIN A1C  11/09/2023   Diabetic kidney evaluation - eGFR measurement  05/09/2024   Medicare Annual Wellness (AWV)  05/15/2024   DTaP/Tdap/Td (2 - Td or Tdap) 12/14/2025    Colonoscopy  03/09/2032   Hepatitis C Screening  Completed   Zoster Vaccines- Shingrix   Completed   HPV VACCINES  Aged Out   Meningococcal B Vaccine  Aged Out   OPHTHALMOLOGY EXAM  Discontinued    Health Maintenance  Health Maintenance Due  Topic Date Due   DEXA SCAN  Never done   MAMMOGRAM  04/09/2022   COVID-19 Vaccine (4 - 2024-25 season) 09/18/2022   Health Maintenance Items Addressed: Mammogram ordered, DEXA ordered  Additional Screening:  Vision Screening: Recommended annual ophthalmology exams for early detection of glaucoma and other disorders of the eye.  Dental Screening: Recommended annual dental exams for proper oral hygiene  Community Resource Referral / Chronic Care Management: CRR required this visit?  No   CCM required this visit?  No     Plan:     I have personally reviewed and noted the following in the patient's chart:   Medical and social history Use of alcohol, tobacco or illicit drugs  Current medications and supplements including opioid prescriptions. Patient is not currently taking opioid prescriptions. Functional ability and status Nutritional status Physical activity Advanced directives List of other physicians Hospitalizations, surgeries, and ER visits in previous 12 months Vitals Screenings to include cognitive, depression, and falls Referrals and appointments  In addition, I have reviewed and discussed with patient certain preventive protocols, quality metrics, and best practice recommendations. A written personalized care plan for preventive services as well as general preventive health recommendations were provided to patient.     Margette Sheldon, LPN   1/61/0960   After Visit Summary: (Mail) Due to this being a telephonic visit, the after visit summary with patients personalized plan was offered to patient via mail   Notes: Please refer to Routing Comments.

## 2023-05-16 NOTE — Telephone Encounter (Signed)
 Patient stated during AWV that she is having sever constipation for the last 2 weeks now.  Patient stated that she could not take Lactulose .  Patient stated that she has tried Dulcolax, Enema and Miralax with no relief.  Now the gas is strong and smelly.  Patient wants to know what the next step should be ER or try a different medication.  Please advise. CB# [phone]  Adrienne [name] N. Glenna Lango, LPN [facility] Annual Wellness Team Direct Dial: [phone]

## 2023-05-16 NOTE — Telephone Encounter (Signed)
 Please see telephone message in chart from 4/25 as this is a duplicate message. Thanks

## 2023-05-17 ENCOUNTER — Other Ambulatory Visit (HOSPITAL_COMMUNITY): Payer: Self-pay

## 2023-05-17 ENCOUNTER — Other Ambulatory Visit: Payer: Self-pay

## 2023-05-17 NOTE — Patient Outreach (Signed)
 Made plan with patient to call back next day due to constipation condition.

## 2023-05-22 ENCOUNTER — Other Ambulatory Visit: Payer: Self-pay

## 2023-05-22 ENCOUNTER — Emergency Department (HOSPITAL_BASED_OUTPATIENT_CLINIC_OR_DEPARTMENT_OTHER)
Admission: EM | Admit: 2023-05-22 | Discharge: 2023-05-23 | Disposition: A | Discharge status: Home / Self Care | Attending: Emergency Medicine | Admitting: Emergency Medicine

## 2023-05-22 ENCOUNTER — Encounter (HOSPITAL_BASED_OUTPATIENT_CLINIC_OR_DEPARTMENT_OTHER): Payer: Self-pay | Admitting: Emergency Medicine

## 2023-05-22 DIAGNOSIS — K5900 Constipation, unspecified: Secondary | ICD-10-CM | POA: Insufficient documentation

## 2023-05-22 DIAGNOSIS — Z794 Long term (current) use of insulin: Secondary | ICD-10-CM | POA: Insufficient documentation

## 2023-05-22 DIAGNOSIS — Z7982 Long term (current) use of aspirin: Secondary | ICD-10-CM | POA: Diagnosis not present

## 2023-05-22 MED ORDER — BISACODYL 10 MG RE SUPP
10.0000 mg | Freq: Once | RECTAL | Status: AC
  Administered 2023-05-22: 10 mg via RECTAL
  Filled 2023-05-22: qty 1

## 2023-05-22 NOTE — ED Provider Notes (Signed)
 [place] EMERGENCY DEPARTMENT AT [facility] Provider Note   CSN: [id number] Arrival date & time: 05/22/23  1924     History {Add pertinent medical, surgical, social history, OB history to HPI:1} Chief Complaint  Patient presents with   Constipation    Adrienne [name] is a 68 y.o. female.  68 year old female with a history of constipation who presents ER today for same.  Patient states that she takes MiraLAX daily secondary to chronic constipation.  She is on Mounjaro  now no recent dose changes but she has noticed it makes her more constipated.  She is usually able to get things worked out himself with self disimpaction, MiraLAX, enemas.  She states that she was having some issues last week so Wednesday night she took 2 Dulcolax and an enema and then Thursday she self disimpacted and had a good bowel movement.  However at that time she noted that there was some on the right rectal wall that feel like she could not get out.  She has not had a bowel movement since that time but has been passing gas.  She also has not tried any other medications since that time.  Has been eating and drinking at her baseline.  Urinating okay.   Constipation      Home Medications Prior to Admission medications   Medication Sig Start Date End Date Taking? Authorizing Provider  acetaminophen  (TYLENOL ) 500 MG tablet Take 500 mg by mouth every 6 (six) hours as needed for moderate pain.    [provider]  aspirin  EC 81 MG tablet Take 81 mg by mouth daily.     [provider]  atorvastatin  (LIPITOR) 80 MG tablet Take 1 tablet (80 mg total) by mouth daily. 05/10/23   Newlin, Enobong, MD  carvedilol  (COREG ) 3.125 MG tablet Take 1 tablet (3.125 mg total) by mouth 2 (two) times daily with a meal. 05/10/23   Joaquin Mulberry, MD  cetirizine  (ZYRTEC ) 10 MG tablet Take 1 tablet (10 mg total) by mouth daily. 08/02/21   Newlin, Enobong, MD  cholecalciferol (VITAMIN D3) 25 MCG (1000 UNIT)  tablet Take 1 tablet (1,000 Units total) by mouth daily. Patient not taking: Reported on 05/10/2023 10/30/19   Newlin, Enobong, MD  clopidogrel  (PLAVIX ) 75 MG tablet Take 1 tablet (75 mg total) by mouth daily. 05/10/23   Newlin, Enobong, MD  Continuous Glucose Receiver (FREESTYLE LIBRE 3 READER) DEVI 1 each by Does not apply route 3 (three) times daily. Please use to check blood sugar three times daily. E11.69 05/26/22   Joaquin Mulberry, MD  Continuous Glucose Sensor (FREESTYLE LIBRE 3 SENSOR) MISC Please use to check blood sugar 3 times daily. Change sensors once every 14 days. 01/13/23   Newlin, Enobong, MD  diclofenac  Sodium (VOLTAREN ) 1 % GEL Apply 4 g topically 4 (four) times daily. 01/19/21   Newlin, Enobong, MD  famotidine  (PEPCID ) 20 MG tablet Take 1 tablet (20 mg total) by mouth 2 (two) times daily. 05/10/23   Newlin, Enobong, MD  fluticasone  (FLONASE ) 50 MCG/ACT nasal spray Place 2 sprays into both nostrils daily. 06/27/22   Newlin, Enobong, MD  guaiFENesin -dextromethorphan  (ROBITUSSIN DM) 100-10 MG/5ML syrup Take 5 mLs by mouth every 6 (six) hours as needed for cough. Patient not taking: Reported on 05/10/2023 02/18/19   Arrien, Curlee Doss, MD  insulin  glargine (LANTUS  SOLOSTAR) 100 UNIT/ML Solostar Pen Inject 30 Units into the skin in the morning AND 22 Units every evening. 05/10/23   Newlin, Enobong, MD  Insulin  Pen  Needle (B-D UF III MINI PEN NEEDLES) 31G X 5 MM MISC use as directed 4 (four) times daily - after meals and at bedtime. 09/27/22   Newlin, Enobong, MD  lactulose  (CHRONULAC ) 10 GM/15ML solution Take 15 mLs (10 g total) by mouth 3 (three) times daily. 05/10/23   Newlin, Enobong, MD  losartan -hydrochlorothiazide  (HYZAAR ) 100-25 MG tablet Take 1 tablet by mouth daily. 05/10/23   Newlin, Enobong, MD  metoCLOPramide  (REGLAN ) 5 MG tablet Take 1 tablet (5 mg total) by mouth every 8 (eight) hours as needed for nausea. 05/10/23   Newlin, Enobong, MD  Misc. Devices MISC Blood pressure monitor. Dx:  Hypertension 12/18/18   Joaquin Mulberry, MD  tirzepatide  (MOUNJARO ) 10 MG/0.5ML Pen Inject 10 mg into the skin once a week. 02/07/23   Newlin, Enobong, MD      Allergies    Codeine, Hydrocodone, Oxycodone, Penicillins, Clindamycin/lincomycin, Niacin and related, Penicillin g, and Niacin    Review of Systems   Review of Systems  Gastrointestinal:  Positive for constipation.    Physical Exam Updated Vital Signs BP 120/72 (BP Location: Left Arm)   Pulse 83   Temp 97.6 F (36.4 C) (Oral)   Resp 20   SpO2 100%  Physical Exam Vitals and nursing note reviewed.  Constitutional:      Appearance: She is well-developed.  HENT:     Head: Normocephalic and atraumatic.  Cardiovascular:     Rate and Rhythm: Normal rate and regular rhythm.  Pulmonary:     Effort: No respiratory distress.     Breath sounds: No stridor.  Abdominal:     General: There is no distension.  Genitourinary:    Comments: Chaperoned by nurse sade, 0 contents in the rectal vault as far as I could insert my finger.  She did have pretty increased rectal tone.  She did receive a suppository earlier but did not have a bowel movement prior to my exam.  Asymmetry, fluctuance or other obvious abnormalities on digital rectal exam. Musculoskeletal:     Cervical back: Normal range of motion.  Neurological:     Mental Status: She is alert.     ED Results / Procedures / Treatments   Labs (all labs ordered are listed, but only abnormal results are displayed) Labs Reviewed  CBC WITH DIFFERENTIAL/PLATELET  BASIC METABOLIC PANEL WITH GFR  MAGNESIUM    EKG None  Radiology No results found.  Procedures Procedures    Medications Ordered in ED Medications  bisacodyl (DULCOLAX) suppository 10 mg (10 mg Rectal Given 05/22/23 2247)    ED Course/ Medical Decision Making/ A&P                                 Medical Decision Making Amount and/or Complexity of Data Reviewed Labs: ordered.  As patient was feeling  asymmetric abnormalities and when she would do her own disimpactions and she is constipated for 4 days without anything in her rectal vault concern for possible obstruction further up.  Discussed CT scan to evaluate for same but patient is hesitant.  I tried to answer all her questions and allay her anxieties about it however she did not want to go through with it.  She did not want to try to be medicated.  She felt she might get really claustrophobic and cause a panic attack.  At this point I feel it is okay to not do the CT and we will just  check her electrolytes and CBC to make sure there is no other causes for the constipation.  If these are okay then she will increase her frequency of MiraLAX use and a couple other recommendations that we will make to her.  She knows that if this does not help that she will still likely need a CT scan to rule out volvulus, anatomic obstruction, adhesions etc. ***  {Document critical care time when appropriate:1} {Document review of labs and clinical decision tools ie heart score, Chads2Vasc2 etc:1}  {Document your independent review of radiology images, and any outside records:1} {Document your discussion with family members, caretakers, and with consultants:1} {Document social determinants of health affecting pt's care:1} {Document your decision making why or why not admission, treatments were needed:1} Final Clinical Impression(s) / ED Diagnoses Final diagnoses:  None    Rx / DC Orders ED Discharge Orders     None

## 2023-05-22 NOTE — ED Triage Notes (Signed)
 Constipation (chronic) Last BM Thursday Last intervention took dulcolax on Thursday ' Discomfort Passing gas Reports some impaction

## 2023-05-23 ENCOUNTER — Telehealth: Payer: Self-pay | Admitting: Family Medicine

## 2023-05-23 LAB — CBC WITH DIFFERENTIAL/PLATELET
Abs Immature Granulocytes: 0.03 10*3/uL (ref 0.00–0.07)
Basophils Absolute: 0.1 10*3/uL (ref 0.0–0.1)
Basophils Relative: 1 %
Eosinophils Absolute: 0.3 10*3/uL (ref 0.0–0.5)
Eosinophils Relative: 3 %
HCT: 34.3 % — ABNORMAL LOW (ref 36.0–46.0)
Hemoglobin: 11.4 g/dL — ABNORMAL LOW (ref 12.0–15.0)
Immature Granulocytes: 0 %
Lymphocytes Relative: 31 %
Lymphs Abs: 3.4 10*3/uL (ref 0.7–4.0)
MCH: 31.6 pg (ref 26.0–34.0)
MCHC: 33.2 g/dL (ref 30.0–36.0)
MCV: 95 fL (ref 80.0–100.0)
Monocytes Absolute: 0.7 10*3/uL (ref 0.1–1.0)
Monocytes Relative: 6 %
Neutro Abs: 6.5 10*3/uL (ref 1.7–7.7)
Neutrophils Relative %: 59 %
Platelets: 450 10*3/uL — ABNORMAL HIGH (ref 150–400)
RBC: 3.61 MIL/uL — ABNORMAL LOW (ref 3.87–5.11)
RDW: 12.2 % (ref 11.5–15.5)
WBC: 10.9 10*3/uL — ABNORMAL HIGH (ref 4.0–10.5)
nRBC: 0 % (ref 0.0–0.2)

## 2023-05-23 LAB — BASIC METABOLIC PANEL WITH GFR
Anion gap: 12 (ref 5–15)
BUN: 24 mg/dL — ABNORMAL HIGH (ref 8–23)
CO2: 26 mmol/L (ref 22–32)
Calcium: 9.8 mg/dL (ref 8.9–10.3)
Chloride: 100 mmol/L (ref 98–111)
Creatinine, Ser: 1.9 mg/dL — ABNORMAL HIGH (ref 0.44–1.00)
GFR, Estimated: 28 mL/min — ABNORMAL LOW (ref >60)
Glucose, Bld: 153 mg/dL — ABNORMAL HIGH (ref 70–99)
Potassium: 3.8 mmol/L (ref 3.5–5.1)
Sodium: 138 mmol/L (ref 135–145)

## 2023-05-23 LAB — MAGNESIUM: Magnesium: 2 mg/dL (ref 1.7–2.4)

## 2023-05-23 NOTE — Telephone Encounter (Signed)
 Copied from CRM [id number]. Topic: Clinical - Medical Advice  >> May 23, 2023  4:45 PM Jethro Morrison wrote: Reason for CRM: THE PATIENT IS REQUESTING DR NEWLIN TO GO OVER HER INFORMATION FROM HER VISIT AT THE ED ON 05/05. STATED SHE HAS SOME CONCERNS AND WOULD LIKE A CALL BACK

## 2023-05-23 NOTE — Discharge Instructions (Signed)
 There are multiple ways to improve your constipation, some options are listed below and all involve over the counter medications:   ?* You can try the magnesium citrate 1 bottle per day until you get results. ?* You can try an enema once daily as needed.  ?* Generally, you need to increase your fluid intake and take stool softeners twice a day until you follow-up with your primary doctor for further management. ?* MiraLAX is an osmotic laxative. This means that it will keep electrolytes and water in your stool and allow you to have easier bowel movements.  ?      * One option is to take 8 capfuls of MiraLAX and put in a 32 ounce Gatorade and shake it up.  Drink 4-6 ounces per hour until you have results.  Another way is per the directions below. ?      * You  can take this medication up to 3 times daily as needed produce bowel movements. However while you're taking this much MiraLAX you need to make sure you are replenishing her electrolytes and water loss. He should also not take it more than 2 days in a row at this level. Generally I recommend people workup to this. For example take 1 capful of MiraLAX once a day for 2-3 days and if you do not get improvement in your bowel habits then take 1 capful twice a day for 2-3 days and if they don't get relief from this and you can take 1 capful 3 times a day for 2 days. If you start having diarrhea, decrease the amount of MiraLAX you are using until you have soft formed stools once to twice a day. Remember during this process that you need to increase your electrolytes and water intake, so oftentimes sports drinks are good for this. Some people end up taking half or one whole capful daily for the rest of her lives to have regular bowel movements you can do this as you feel is proper. ?

## 2023-05-23 NOTE — Telephone Encounter (Signed)
 Routing to PCP

## 2023-05-24 NOTE — Telephone Encounter (Signed)
 I spoke to the patient on the phone and she will be trying the regimen prescribed at the ED to see if that controls her constipation and she will let me know if this works or not so we can discuss a possible reduction in her dose of Mounjaro  and increasing Lantus  if constipation persist.

## 2023-05-26 ENCOUNTER — Other Ambulatory Visit: Payer: Self-pay

## 2023-05-30 ENCOUNTER — Telehealth: Payer: Self-pay | Admitting: Family Medicine

## 2023-05-30 ENCOUNTER — Other Ambulatory Visit (HOSPITAL_COMMUNITY): Payer: Self-pay

## 2023-05-30 DIAGNOSIS — E1169 Type 2 diabetes mellitus with other specified complication: Secondary | ICD-10-CM

## 2023-05-30 MED ORDER — TIRZEPATIDE 7.5 MG/0.5ML ~~LOC~~ SOAJ
7.5000 mg | SUBCUTANEOUS | 1 refills | Status: DC
  Filled 2023-05-30: qty 6, 84d supply, fill #0
  Filled 2023-07-05 – 2023-08-17 (×2): qty 6, 84d supply, fill #1

## 2023-05-30 MED ORDER — LANTUS SOLOSTAR 100 UNIT/ML ~~LOC~~ SOPN: PEN_INJECTOR | SUBCUTANEOUS | Status: DC

## 2023-05-30 NOTE — Telephone Encounter (Signed)
 Please inform her I have sent a prescription for 7.5 mg of Mounjaro  to her pharmacy.  She needs to increase her morning dose of Lantus  from 30 units to 34 units due to the decrease in Mounjaro  dose and continue with the 22 units she was taking in the evening.

## 2023-05-30 NOTE — Addendum Note (Signed)
 Addended by: Carine Nordgren on: 05/30/2023 04:42 PM   Modules accepted: Orders

## 2023-05-30 NOTE — Telephone Encounter (Signed)
 Routing to PCP for review.

## 2023-05-30 NOTE — Telephone Encounter (Signed)
 Copied from CRM [id number]. Topic: Clinical - Medication Question >> May 30, 2023  9:54 AM Elle L wrote:  Reason for CRM: The patient is requesting a lower dosage of her tirzepatide  (MOUNJARO ) 10 MG/0.5ML Pen be sent in to the [facility]. The patient's call back number is [phone].

## 2023-05-31 ENCOUNTER — Other Ambulatory Visit (HOSPITAL_COMMUNITY): Payer: Self-pay

## 2023-05-31 NOTE — Telephone Encounter (Signed)
 FYI Copied from CRM [id number]. Topic: Clinical - Medication Question >> May 31, 2023 11:18 AM Rosaria Common wrote:  Reason for CRM: The patient is requesting a lower dosage of her tirzepatide  (MOUNJARO ) 10 MG/0.5ML Pen be sent in to the [facility]. The patient's call back number is [phone].

## 2023-05-31 NOTE — Telephone Encounter (Signed)
 Patient was called and informed of medication changes.

## 2023-06-15 ENCOUNTER — Other Ambulatory Visit (HOSPITAL_COMMUNITY): Payer: Self-pay

## 2023-06-16 ENCOUNTER — Other Ambulatory Visit (HOSPITAL_COMMUNITY): Payer: Self-pay

## 2023-06-16 ENCOUNTER — Other Ambulatory Visit: Payer: Self-pay

## 2023-07-04 ENCOUNTER — Other Ambulatory Visit (HOSPITAL_COMMUNITY): Payer: Self-pay

## 2023-07-04 ENCOUNTER — Other Ambulatory Visit: Payer: Self-pay | Admitting: Family Medicine

## 2023-07-04 MED ORDER — FREESTYLE LIBRE 3 READER DEVI
1.0000 | Freq: Three times a day (TID) | 0 refills | Status: AC
  Filled 2023-07-04: qty 1, 90d supply, fill #0

## 2023-07-05 ENCOUNTER — Other Ambulatory Visit: Payer: Self-pay

## 2023-07-05 ENCOUNTER — Other Ambulatory Visit (HOSPITAL_COMMUNITY): Payer: Self-pay

## 2023-07-10 ENCOUNTER — Other Ambulatory Visit: Payer: Self-pay

## 2023-07-10 ENCOUNTER — Other Ambulatory Visit (HOSPITAL_COMMUNITY): Payer: Self-pay

## 2023-07-15 ENCOUNTER — Other Ambulatory Visit (HOSPITAL_COMMUNITY): Payer: Self-pay

## 2023-07-17 ENCOUNTER — Other Ambulatory Visit: Payer: Self-pay

## 2023-07-19 ENCOUNTER — Other Ambulatory Visit (HOSPITAL_COMMUNITY): Payer: Self-pay

## 2023-08-03 ENCOUNTER — Other Ambulatory Visit (HOSPITAL_COMMUNITY): Payer: Self-pay

## 2023-08-07 ENCOUNTER — Other Ambulatory Visit: Payer: Self-pay

## 2023-08-14 ENCOUNTER — Telehealth: Payer: Self-pay

## 2023-08-14 ENCOUNTER — Other Ambulatory Visit (HOSPITAL_COMMUNITY): Payer: Self-pay

## 2023-08-14 ENCOUNTER — Other Ambulatory Visit: Payer: Self-pay

## 2023-08-14 DIAGNOSIS — Z794 Long term (current) use of insulin: Secondary | ICD-10-CM

## 2023-08-14 MED ORDER — LANTUS SOLOSTAR 100 UNIT/ML ~~LOC~~ SOPN
PEN_INJECTOR | SUBCUTANEOUS | 1 refills | Status: DC
  Filled 2023-08-14 (×2): qty 45, 80d supply, fill #0
  Filled 2023-10-02 – 2023-11-15 (×2): qty 45, 80d supply, fill #1

## 2023-08-14 NOTE — Telephone Encounter (Signed)
 Copied from CRM [id number]. Topic: Clinical - Medication Question >> Aug 14, 2023 12:25 PM Jayma L wrote: Reason for CRM: patient called asking for a handicap sticker for car, said it needs to be wrote for ongoing or forever use. Please callback patient to advise its been taken care of

## 2023-08-14 NOTE — Telephone Encounter (Signed)
 Copied from CRM [id number]. Topic: Clinical - Medication Question >> Aug 14, 2023 11:28 AM Gustabo D wrote: insulin  glargine (LANTUS  SOLOSTAR) 100 UNIT/ML Solostar Pen- Says the pharmacy doesn't have a prescription for it. Patient is calling to check on it to be sure it'll be ready when she needs it refilled. Patient will need the medication sooner than her appointment in October because she will be out by then.

## 2023-08-14 NOTE — Telephone Encounter (Signed)
 nsulin glargine (LANTUS  SOLOSTAR) 100 UNIT/ML Solostar Pen sent to [facility] [address], [address], [place] [address] Phone: [phone]    Call to patient unable to reach VM Left . If patient returns call please make her aware.

## 2023-08-15 NOTE — Telephone Encounter (Signed)
 Handicap form has been placed in PCP box for signature. Patient will be called once ready for pick up.

## 2023-08-17 ENCOUNTER — Other Ambulatory Visit: Payer: Self-pay

## 2023-08-18 ENCOUNTER — Telehealth: Payer: Self-pay

## 2023-08-18 NOTE — Telephone Encounter (Signed)
 Patient was called and informed of handicap form is ready for pick up.

## 2023-08-18 NOTE — Telephone Encounter (Signed)
 Patient's daughter came to the office today and picked up the form.

## 2023-08-21 ENCOUNTER — Other Ambulatory Visit (HOSPITAL_COMMUNITY): Payer: Self-pay

## 2023-09-05 ENCOUNTER — Other Ambulatory Visit (HOSPITAL_COMMUNITY): Payer: Self-pay

## 2023-10-02 ENCOUNTER — Other Ambulatory Visit: Payer: Self-pay | Admitting: Family Medicine

## 2023-10-02 ENCOUNTER — Other Ambulatory Visit (HOSPITAL_COMMUNITY): Payer: Self-pay

## 2023-10-03 ENCOUNTER — Other Ambulatory Visit: Payer: Self-pay

## 2023-10-03 ENCOUNTER — Other Ambulatory Visit (HOSPITAL_COMMUNITY): Payer: Self-pay

## 2023-10-03 MED ORDER — FREESTYLE LIBRE 3 SENSOR MISC
5 refills | Status: AC
  Filled 2023-10-03: qty 2, 28d supply, fill #0
  Filled 2023-10-30: qty 2, 28d supply, fill #1
  Filled 2023-12-11: qty 2, 28d supply, fill #2
  Filled 2024-01-01: qty 2, 28d supply, fill #3
  Filled 2024-02-20: qty 2, 28d supply, fill #4

## 2023-10-03 NOTE — Telephone Encounter (Signed)
 Requested Prescriptions  Pending Prescriptions Disp Refills   Continuous Glucose Sensor (FREESTYLE LIBRE 3 SENSOR) MISC 2 each 5    Sig: Please use to check blood sugar 3 times daily. Change sensors once every 14 days.     Endocrinology: Diabetes - Testing Supplies Passed - 10/03/2023  1:53 PM      Passed - Valid encounter within last 12 months    Recent Outpatient Visits           4 months ago Type 2 diabetes mellitus with other specified complication, with long-term current use of insulin  [facility])   [facility] [place] Delbert Clam, MD   7 months ago Type 2 diabetes mellitus with other specified complication, with long-term current use of insulin  [facility])   [facility] [place]. [facility] Delbert Clam, MD   11 months ago Type 2 diabetes mellitus with other specified complication, with long-term current use of insulin  [facility])   [facility] [place] Delbert Clam, MD   1 year ago Type 2 diabetes mellitus with other specified complication, with long-term current use of insulin  [facility])   [facility] [place]. [facility] Delbert Clam, MD   1 year ago Type 2 diabetes mellitus with other specified complication, with long-term current use of insulin  [facility])   [facility] [place]. [facility] Delbert Clam, MD

## 2023-10-10 ENCOUNTER — Other Ambulatory Visit (HOSPITAL_COMMUNITY): Payer: Self-pay

## 2023-10-18 ENCOUNTER — Ambulatory Visit: Admitting: Family Medicine

## 2023-10-18 ENCOUNTER — Telehealth: Payer: Self-pay | Admitting: Family Medicine

## 2023-10-18 NOTE — Telephone Encounter (Signed)
 Contacted pt left pt unsure about coming in I did give her options to resch she said she will call back at 5

## 2023-10-30 ENCOUNTER — Other Ambulatory Visit (HOSPITAL_COMMUNITY): Payer: Self-pay

## 2023-10-30 ENCOUNTER — Other Ambulatory Visit: Payer: Self-pay | Admitting: Family Medicine

## 2023-10-30 ENCOUNTER — Other Ambulatory Visit: Payer: Self-pay

## 2023-10-30 MED ORDER — MOUNJARO 7.5 MG/0.5ML ~~LOC~~ SOAJ
7.5000 mg | SUBCUTANEOUS | 0 refills | Status: DC
  Filled 2023-10-30: qty 2, 28d supply, fill #0

## 2023-11-15 ENCOUNTER — Other Ambulatory Visit: Payer: Self-pay

## 2023-11-15 ENCOUNTER — Other Ambulatory Visit: Payer: Self-pay | Admitting: Family Medicine

## 2023-11-15 ENCOUNTER — Other Ambulatory Visit (HOSPITAL_COMMUNITY): Payer: Self-pay

## 2023-11-15 DIAGNOSIS — E1169 Type 2 diabetes mellitus with other specified complication: Secondary | ICD-10-CM

## 2023-11-15 DIAGNOSIS — E1122 Type 2 diabetes mellitus with diabetic chronic kidney disease: Secondary | ICD-10-CM

## 2023-11-15 MED ORDER — LOSARTAN POTASSIUM-HCTZ 100-25 MG PO TABS
1.0000 | ORAL_TABLET | Freq: Every day | ORAL | 0 refills | Status: DC
  Filled 2023-11-15: qty 90, 90d supply, fill #0

## 2023-11-15 MED ORDER — ATORVASTATIN CALCIUM 80 MG PO TABS
80.0000 mg | ORAL_TABLET | Freq: Every day | ORAL | 0 refills | Status: DC
  Filled 2023-11-15: qty 90, 90d supply, fill #0

## 2023-11-27 ENCOUNTER — Ambulatory Visit: Payer: Self-pay

## 2023-11-27 NOTE — Telephone Encounter (Signed)
 FYI Only or Action Required?: Action required by provider: clinical question for provider.  Patient was last seen in primary care on 05/10/2023 by Newlin, Enobong, MD.  Called Nurse Triage reporting Abdominal Pain.  Symptoms began several days ago.  Interventions attempted: Nothing.  Symptoms are: unchanged.  Triage Disposition: See Physician Within 24 Hours  Patient/caregiver understands and will follow disposition?: No, wishes to speak with PCP   Copied from CRM #[id number]. Topic: Clinical - Red Word Triage >> Nov 27, 2023 12:58 PM Hadassah PARAS wrote: Red Word that prompted transfer to Nurse Triage: Last Wednesday pt was experiencing diarrhea and vommiting. Now only stomach spasms  and weakness Reason for Disposition  [1] MODERATE pain (e.g., interferes with normal activities) AND [2] pain comes and goes (cramps) AND [3] present > 24 hours  (Exception: Pain with Vomiting or Diarrhea - see that Guideline.)  Answer Assessment - Initial Assessment Questions No available appts today. Advised UC today and ED if symptoms worsen. Patient declines and reports I think I could make it until my appt, Wednesday.  Patient requesting call back for medication to take over the counter.   1. LOCATION: Where does it hurt?      Around belly button 2. RADIATION: Does the pain shoot anywhere else? (e.g., chest, back)     no 3. ONSET: When did the pain begin? (e.g., minutes, hours or days ago)      Last Wednesday; took Castro oil for constipation, then having symptoms since. Last BM yesterday 4. SUDDEN: Gradual or sudden onset?     gradual 5. PATTERN Does the pain come and go, or is it constant?     Comes and goes; dull ache 6. SEVERITY: How bad is the pain?  (e.g., Scale 1-10; mild, moderate, or severe)     6/10 when occurring  8. CAUSE: What do you think is causing the stomach pain? (e.g., gallstones, recent abdominal surgery)     Castro oil  10. OTHER SYMPTOMS: Do you have any  other symptoms? (e.g., back pain, diarrhea, fever, urination pain, vomiting)       Denies fever, chills, n/v/d, urination problems  Protocols used: Abdominal Pain - Female-A-AH

## 2023-11-28 NOTE — Telephone Encounter (Signed)
 The diarrhea and vomiting could have caused electrolyte losses and dehydration.  Please advise to stay hydrated.  Since she has a visit with me tomorrow I will check her labs.

## 2023-11-28 NOTE — Telephone Encounter (Signed)
 Routing to PCP for review.  Patient has upcoming appointment on 01/29/23

## 2023-11-29 ENCOUNTER — Encounter: Payer: Self-pay | Admitting: Family Medicine

## 2023-11-29 ENCOUNTER — Ambulatory Visit: Attending: Family Medicine | Admitting: Family Medicine

## 2023-11-29 ENCOUNTER — Other Ambulatory Visit: Payer: Self-pay

## 2023-11-29 ENCOUNTER — Other Ambulatory Visit (HOSPITAL_COMMUNITY): Payer: Self-pay

## 2023-11-29 VITALS — BP 96/64 | HR 77 | Temp 98.0°F | Ht 66.0 in | Wt 188.0 lb

## 2023-11-29 DIAGNOSIS — E1169 Type 2 diabetes mellitus with other specified complication: Secondary | ICD-10-CM

## 2023-11-29 DIAGNOSIS — Z23 Encounter for immunization: Secondary | ICD-10-CM

## 2023-11-29 DIAGNOSIS — R112 Nausea with vomiting, unspecified: Secondary | ICD-10-CM | POA: Diagnosis not present

## 2023-11-29 DIAGNOSIS — H3411 Central retinal artery occlusion, right eye: Secondary | ICD-10-CM

## 2023-11-29 DIAGNOSIS — E785 Hyperlipidemia, unspecified: Secondary | ICD-10-CM

## 2023-11-29 DIAGNOSIS — K5909 Other constipation: Secondary | ICD-10-CM

## 2023-11-29 DIAGNOSIS — Z794 Long term (current) use of insulin: Secondary | ICD-10-CM | POA: Diagnosis not present

## 2023-11-29 DIAGNOSIS — E1122 Type 2 diabetes mellitus with diabetic chronic kidney disease: Secondary | ICD-10-CM | POA: Diagnosis not present

## 2023-11-29 DIAGNOSIS — I129 Hypertensive chronic kidney disease with stage 1 through stage 4 chronic kidney disease, or unspecified chronic kidney disease: Secondary | ICD-10-CM | POA: Diagnosis not present

## 2023-11-29 DIAGNOSIS — K219 Gastro-esophageal reflux disease without esophagitis: Secondary | ICD-10-CM | POA: Diagnosis not present

## 2023-11-29 LAB — POCT GLYCOSYLATED HEMOGLOBIN (HGB A1C): HbA1c, POC (controlled diabetic range): 8 % — AB (ref 0.0–7.0)

## 2023-11-29 MED ORDER — LANTUS SOLOSTAR 100 UNIT/ML ~~LOC~~ SOPN
PEN_INJECTOR | SUBCUTANEOUS | 1 refills | Status: AC
  Filled 2023-11-29 – 2024-02-06 (×2): qty 45, 80d supply, fill #0

## 2023-11-29 MED ORDER — LOSARTAN POTASSIUM-HCTZ 100-25 MG PO TABS
1.0000 | ORAL_TABLET | Freq: Every day | ORAL | 1 refills | Status: AC
  Filled 2023-11-29 – 2024-01-09 (×2): qty 90, 90d supply, fill #0

## 2023-11-29 MED ORDER — MOUNJARO 7.5 MG/0.5ML ~~LOC~~ SOAJ
7.5000 mg | SUBCUTANEOUS | 1 refills | Status: AC
  Filled 2023-11-29 (×2): qty 6, 84d supply, fill #0
  Filled 2024-02-20: qty 6, 84d supply, fill #1

## 2023-11-29 MED ORDER — CLOPIDOGREL BISULFATE 75 MG PO TABS
75.0000 mg | ORAL_TABLET | Freq: Every day | ORAL | 1 refills | Status: AC
Start: 2023-11-29 — End: ?
  Filled 2023-11-29 – 2024-01-01 (×3): qty 90, 90d supply, fill #0

## 2023-11-29 MED ORDER — FAMOTIDINE 20 MG PO TABS
20.0000 mg | ORAL_TABLET | Freq: Two times a day (BID) | ORAL | 1 refills | Status: AC
Start: 2023-11-29 — End: ?
  Filled 2023-11-29 – 2024-01-01 (×2): qty 180, 90d supply, fill #0

## 2023-11-29 MED ORDER — CARVEDILOL 3.125 MG PO TABS
3.1250 mg | ORAL_TABLET | Freq: Two times a day (BID) | ORAL | 1 refills | Status: AC
Start: 2023-11-29 — End: ?
  Filled 2023-11-29 – 2024-01-01 (×2): qty 180, 90d supply, fill #0

## 2023-11-29 MED ORDER — ATORVASTATIN CALCIUM 80 MG PO TABS
80.0000 mg | ORAL_TABLET | Freq: Every day | ORAL | 1 refills | Status: AC
  Filled 2023-11-29 – 2024-01-22 (×3): qty 90, 90d supply, fill #0

## 2023-11-29 NOTE — Telephone Encounter (Signed)
 Noted

## 2023-11-29 NOTE — Progress Notes (Signed)
 Subjective:  Patient ID: Adrienne [name], female    DOB: [birth year]  Age: 68 y.o. MRN: [id number]  CC: Medical Management of Chronic Issues (Vomiting and diarrhea/Abdominal pain/Discuss mounjaro )     Discussed the use of AI scribe software for clinical note transcription with the patient, who gave verbal consent to proceed.  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 who presents with nausea, diarrhea, and vomiting.  Nausea, diarrhea, and vomiting began last week after taking castor oil as a laxative. Diarrhea and vomiting subsided by last Wednesday, but she experienced another episode of vomiting today after eating dry barbecue chicken. Since then, she has only consumed crackers and water. Ongoing abdominal discomfort has been present since last Wednesday, primarily in the lower abdomen and around the ribs, and is gradually easing.  Diabetes management includes Mounjaro  on Thursdays and Lantus  with doses of 34 units in the morning and 23 units in the evening. She feels weak and lacks energy the day after taking Mounjaro . Her recent A1c increased to 8.0 from 7.0, attributed to dietary choices during Halloween.  She feels lightheaded and acknowledges inadequate fluid intake, contributing to dehydration. She lives alone but receives assistance from a neighbor who is a engineer, civil (consulting) and a cleaning service twice a week.    Past Medical History:  Diagnosis Date   Blindness of both eyes    Cataract    Diabetes (HCC)    GERD (gastroesophageal reflux disease)    Glaucoma    Heart murmur    High cholesterol    Hypertension    Stroke (HCC) 2015   R eye    Past Surgical  History:  Procedure Laterality Date   CATARACT EXTRACTION, BILATERAL     Laser eye surgery, cataract surgery bi-lat   CESAREAN SECTION     Twins, Breech   EYE SURGERY Bilateral 2012   LASER, CATARACT   TEE WITHOUT CARDIOVERSION N/A 12/31/2013   Procedure: TRANSESOPHAGEAL ECHOCARDIOGRAM (TEE);  Surgeon: Adrienne JONELLE Bergamo, MD;  Location: [facility] ENDOSCOPY;  Service: Cardiovascular;  Laterality: N/A;  H&P in file   TUBAL LIGATION Bilateral     Family History  Problem Relation Age of Onset   Dementia Mother    Diabetes Father    Hypertension Father    Hypertension Sister    Diabetes Sister    Diabetes Sister    Heart disease Sister    Colon polyps Daughter    Colon cancer Neg Hx    Esophageal cancer Neg Hx    Rectal cancer Neg Hx    Stomach cancer Neg Hx     Social History   Socioeconomic History   Marital status: Divorced    Spouse name: Not on file   Number of children: 3   Years of education: 12   Highest education level: 12th grade  Occupational History    Employer: [employer]  Tobacco Use   Smoking status: Former    Passive exposure: Past   Smokeless tobacco: Never   Tobacco comments:    QUIT IN 1987  Vaping Use   Vaping status: Never Used  Substance and Sexual Activity   Alcohol use: No    Alcohol/week: 0.0 standard drinks of  alcohol    Comment: QUIT IN 1987   Drug use: No    Frequency: 2.0 times per week    Comment: QUIT 1980   Sexual activity: Not Currently  Other Topics Concern   Not on file  Social History Narrative   Patient is divorced with 3 children.   Patient is right handed.   Patient has 12 th grade education.   Patient has not been drinking caffeine.   Social Drivers of Corporate Investment Banker Strain: Low Risk  (05/16/2023)   Overall Financial Resource Strain (CARDIA)    Difficulty of Paying Living Expenses: Not hard at all  Food Insecurity: No Food Insecurity (05/16/2023)   Hunger Vital Sign    Worried About Running Out of Food  in the Last Year: Never true    Ran Out of Food in the Last Year: Never true  Transportation Needs: No Transportation Needs (05/16/2023)   PRAPARE - Administrator, Civil Service (Medical): No    Lack of Transportation (Non-Medical): No  Physical Activity: Inactive (05/16/2023)   Exercise Vital Sign    Days of Exercise per Week: 0 days    Minutes of Exercise per Session: 0 min  Stress: No Stress Concern Present (05/16/2023)   [employer] of Occupational Health - Occupational Stress Questionnaire    Feeling of Stress : Not at all  Social Connections: Moderately Integrated (05/16/2023)   Social Connection and Isolation Panel    Frequency of Communication with Friends and Family: More than three times a week    Frequency of Social Gatherings with Friends and Family: More than three times a week    Attends Religious Services: More than 4 times per year    Active Member of [employer] or Organizations: Yes    Attends Engineer, Structural: More than 4 times per year    Marital Status: Divorced    Allergies  Allergen Reactions   Codeine Itching   Hydrocodone Itching   Oxycodone Itching   Penicillins Itching   Clindamycin/Lincomycin Itching   Niacin And Related Itching   Penicillin G Itching   Niacin Itching and Anxiety    Facial flushing    Outpatient Medications Prior to Visit  Medication Sig Dispense Refill   acetaminophen  (TYLENOL ) 500 MG tablet Take 500 mg by mouth every 6 (six) hours as needed for moderate pain.     aspirin  EC 81 MG tablet Take 81 mg by mouth daily.      cetirizine  (ZYRTEC ) 10 MG tablet Take 1 tablet (10 mg total) by mouth daily. 30 tablet 1   Continuous Glucose Receiver (FREESTYLE LIBRE 3 READER) DEVI Please use to check blood sugar three times daily. 1 each 0   Continuous Glucose Sensor (FREESTYLE LIBRE 3 SENSOR) MISC Please use to check blood sugar 3 times daily. Change sensors once every 14 days. 2 each 5   diclofenac  Sodium (VOLTAREN ) 1 %  GEL Apply 4 g topically 4 (four) times daily. 100 g 1   fluticasone  (FLONASE ) 50 MCG/ACT nasal spray Place 2 sprays into both nostrils daily. 16 g 2   Insulin  Pen Needle (B-D UF III MINI PEN NEEDLES) 31G X 5 MM MISC use as directed 4 (four) times daily - after meals and at bedtime. 100 each 3   lactulose  (CHRONULAC ) 10 GM/15ML solution Take 15 mLs (10 g total) by mouth 3 (three) times daily. 473 mL 0   metoCLOPramide  (REGLAN ) 5 MG tablet Take 1 tablet (5 mg total)  by mouth every 8 (eight) hours as needed for nausea. 90 tablet 1   Misc. Devices MISC Blood pressure monitor. Dx: Hypertension 1 each 0   atorvastatin  (LIPITOR) 80 MG tablet Take 1 tablet (80 mg total) by mouth daily. 90 tablet 0   carvedilol  (COREG ) 3.125 MG tablet Take 1 tablet (3.125 mg total) by mouth 2 (two) times daily with a meal. 180 tablet 1   clopidogrel  (PLAVIX ) 75 MG tablet Take 1 tablet (75 mg total) by mouth daily. 90 tablet 1   famotidine  (PEPCID ) 20 MG tablet Take 1 tablet (20 mg total) by mouth 2 (two) times daily. 180 tablet 1   insulin  glargine (LANTUS  SOLOSTAR) 100 UNIT/ML Solostar Pen Inject 34 Units into the skin in the morning AND 22 Units every evening. 45 mL 1   losartan -hydrochlorothiazide  (HYZAAR ) 100-25 MG tablet Take 1 tablet by mouth daily. 90 tablet 0   tirzepatide  (MOUNJARO ) 7.5 MG/0.5ML Pen Inject 7.5 mg into the skin once a week. 2 mL 0   cholecalciferol (VITAMIN D3) 25 MCG (1000 UNIT) tablet Take 1 tablet (1,000 Units total) by mouth daily. (Patient not taking: Reported on 11/29/2023) 30 tablet 3   guaiFENesin -dextromethorphan  (ROBITUSSIN DM) 100-10 MG/5ML syrup Take 5 mLs by mouth every 6 (six) hours as needed for cough. (Patient not taking: Reported on 11/29/2023) 118 mL 0   No facility-administered medications prior to visit.     ROS Review of Systems  Constitutional:  Negative for activity change and appetite change.  HENT:  Negative for sinus pressure and sore throat.   Eyes:  Positive for  visual disturbance.  Respiratory:  Negative for chest tightness, shortness of breath and wheezing.   Cardiovascular:  Negative for chest pain and palpitations.  Gastrointestinal:  Positive for abdominal pain, diarrhea and vomiting. Negative for abdominal distention and constipation.  Genitourinary: Negative.   Musculoskeletal: Negative.   Neurological:  Positive for light-headedness.  Psychiatric/Behavioral:  Negative for behavioral problems and dysphoric mood.     Objective:  BP 96/64   Pulse 77   Temp 98 F (36.7 C) (Oral)   Ht 5' 6 (1.676 m)   Wt 188 lb (85.3 kg)   SpO2 99%   BMI 30.34 kg/m      11/29/2023    2:31 PM 05/23/2023    1:04 AM 05/22/2023    7:43 PM  BP/Weight  Systolic BP 96 125 120  Diastolic BP 64 80 72  Wt. (Lbs) 188    BMI 30.34 kg/m2        Physical Exam Constitutional:      Appearance: She is well-developed.  Cardiovascular:     Rate and Rhythm: Normal rate.     Heart sounds: Normal heart sounds. No murmur heard. Pulmonary:     Effort: Pulmonary effort is normal.     Breath sounds: Normal breath sounds. No wheezing or rales.  Chest:     Chest wall: No tenderness.  Abdominal:     General: Bowel sounds are normal. There is no distension.     Palpations: Abdomen is soft. There is no mass.     Tenderness: There is no abdominal tenderness.  Musculoskeletal:        General: Normal range of motion.     Right lower leg: No edema.     Left lower leg: No edema.  Neurological:     Mental Status: She is alert and oriented to person, place, and time.  Psychiatric:        Mood  and Affect: Mood normal.        Latest Ref Rng & Units 05/22/2023   11:15 PM 05/10/2023    3:26 PM 11/01/2022    4:28 PM  CMP  Glucose 70 - 99 mg/dL 846  845  862   BUN 8 - 23 mg/dL 24  23  24    Creatinine 0.44 - 1.00 mg/dL 8.09  7.98  7.86   Sodium 135 - 145 mmol/L 138  141  138   Potassium 3.5 - 5.1 mmol/L 3.8  4.6  3.7   Chloride 98 - 111 mmol/L 100  102  100   CO2 22  - 32 mmol/L 26  22  23    Calcium  8.9 - 10.3 mg/dL 9.8  9.6  9.5   Total Protein 6.0 - 8.5 g/dL  6.8  6.7   Total Bilirubin 0.0 - 1.2 mg/dL  0.4  0.2   Alkaline Phos 44 - 121 IU/L  122  105   AST 0 - 40 IU/L  17  23   ALT 0 - 32 IU/L  25  25     Lipid Panel     Component Value Date/Time   CHOL 148 05/10/2023 1526   TRIG 146 05/10/2023 1526   HDL 45 05/10/2023 1526   CHOLHDL 4.4 05/07/2019 1049   CHOLHDL 2.7 01/04/2016 1138   VLDL 12 01/04/2016 1138   LDLCALC 78 05/10/2023 1526    CBC    Component Value Date/Time   WBC 10.9 (H) 05/22/2023 2315   RBC 3.61 (L) 05/22/2023 2315   HGB 11.4 (L) 05/22/2023 2315   HGB 11.5 10/19/2020 1649   HCT 34.3 (L) 05/22/2023 2315   HCT 33.4 (L) 10/19/2020 1649   PLT 450 (H) 05/22/2023 2315   PLT 508 (H) 10/19/2020 1649   MCV 95.0 05/22/2023 2315   MCV 90 10/19/2020 1649   MCH 31.6 05/22/2023 2315   MCHC 33.2 05/22/2023 2315   RDW 12.2 05/22/2023 2315   RDW 11.8 10/19/2020 1649   LYMPHSABS 3.4 05/22/2023 2315   LYMPHSABS 2.7 10/19/2020 1649   MONOABS 0.7 05/22/2023 2315   EOSABS 0.3 05/22/2023 2315   EOSABS 0.3 10/19/2020 1649   BASOSABS 0.1 05/22/2023 2315   BASOSABS 0.1 10/19/2020 1649    Lab Results  Component Value Date   HGBA1C 8.0 (A) 11/29/2023    Lab Results  Component Value Date   HGBA1C 8.0 (A) 11/29/2023   HGBA1C 7.0 05/10/2023   HGBA1C 8.2 (A) 02/07/2023       Assessment & Plan Nausea and vomiting with dehydration Acute gastroenteritis with nausea, vomiting, and diarrhea, likely worsened by castor oil. Suspected dehydration and possible electrolyte imbalance which can explain weakness - Ordered comprehensive metabolic panel for electrolytes. - Encouraged hydration with water and Gatorade. - Prescribe potassium supplements if hypokalemia is confirmed.  Type 2 diabetes mellitus A1c increased to 8.0 from 7.0, likely due to dietary indiscretion. Current treatment includes Tirzepatide  and Insulin  glargine. She  reports weakness on current Mounjaro  dose but prefers it over oral medications. - Continue Tirzepatide  (Mounjaro ) 7.5 mg weekly. - Continue Insulin  glargine (Lantus ) 34 units AM, 23 units PM. - Discussed reducing Mounjaro  to 5 mg and increasing Lantus  to 37 units AM, 25 units PM if symptoms persist. -Counseled on Diabetic diet, the healthy plate, 849 minutes of moderate intensity exercise/week Blood sugar logs with fasting goals of 80-120 mg/dl, random of less than 819 and in the event of sugars less than 60 mg/dl or greater  than 400 mg/dl encouraged to notify the clinic. Advised on the need for annual eye exams, annual foot exams, Pneumonia vaccine.   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 - Continue Miralax. -Counseled on increasing fiber intake, fruits and vegetable, limit intake of foods like cheese, white bread, white rice  GERD - Stable - Continue PPI   Central retinal occlusion of right eye Visually impaired in both eyes - She remains on Plavix    General Health Maintenance Due for flu vaccination. - Administered flu shot.       Meds ordered this encounter  Medications   atorvastatin  (LIPITOR) 80 MG tablet    Sig: Take 1 tablet (80 mg total) by mouth daily.    Dispense:  90 tablet    Refill:  1   carvedilol  (COREG ) 3.125 MG tablet    Sig: Take 1 tablet (3.125 mg total) by mouth 2 (two) times daily with a meal.    Dispense:  180 tablet    Refill:  1   clopidogrel  (PLAVIX ) 75 MG tablet    Sig: Take 1 tablet (75 mg  total) by mouth daily.    Dispense:  90 tablet    Refill:  1   famotidine  (PEPCID ) 20 MG tablet    Sig: Take 1 tablet (20 mg total) by mouth 2 (two) times daily.    Dispense:  180 tablet    Refill:  1   insulin  glargine (LANTUS  SOLOSTAR) 100 UNIT/ML Solostar Pen    Sig: Inject 34 Units into the skin in the morning AND 22 Units every evening.    Dispense:  45 mL    Refill:  1   losartan -hydrochlorothiazide  (HYZAAR ) 100-25 MG tablet    Sig: Take 1 tablet by mouth daily.    Dispense:  90 tablet    Refill:  1   tirzepatide  (MOUNJARO ) 7.5 MG/0.5ML Pen    Sig: Inject 7.5 mg into the skin once a week.    Dispense:  6 mL    Refill:  1    Follow-up: Return in about 3 months (around 02/29/2024) for Chronic medical conditions.       Corrina Sabin, MD, FAAFP. [facility] [place], [place] [phone]   11/29/2023, 4:32 PM

## 2023-11-29 NOTE — Patient Instructions (Signed)
 VISIT SUMMARY:  During today's visit, we discussed your recent symptoms of nausea, diarrhea, and vomiting, which began after taking castor oil. We also reviewed your diabetes management and noted an increase in your A1c levels. Additionally, we addressed your chronic constipation and provided a flu vaccination.  YOUR PLAN:  -ACUTE GASTROENTERITIS WITH DEHYDRATION: Acute gastroenteritis is an inflammation of the stomach and intestines, often causing nausea, vomiting, and diarrhea. Your symptoms were likely worsened by castor oil. We suspect you may be dehydrated and possibly have an electrolyte imbalance. We have ordered a comprehensive metabolic panel to check your electrolytes and encouraged you to stay hydrated with water and Gatorade. If your potassium levels are low, we will prescribe supplements.  -TYPE 2 DIABETES MELLITUS: Type 2 diabetes is a condition where your body does not use insulin  properly, leading to high blood sugar levels. Your A1c has increased to 8.0, likely due to dietary choices. We will continue your current medications, Tirzepatide  (Mounjaro ) and Insulin  glargine (Lantus ). If you continue to feel weak, we may adjust your medication doses.  -CONSTIPATION: Constipation is a condition where you have difficulty passing stools. This may be related to your use of Tirzepatide . We recommend continuing with Miralax to help manage this issue.  -GENERAL HEALTH MAINTENANCE: You were due for a flu vaccination, which we administered today. Keeping up with vaccinations is important for your overall health.  INSTRUCTIONS:  Please follow up with us  after your comprehensive metabolic panel results are available. If you continue to feel weak or have any other concerns, contact our office. Stay hydrated and monitor your blood sugar levels regularly.

## 2023-11-30 ENCOUNTER — Ambulatory Visit: Payer: Self-pay | Admitting: Family Medicine

## 2023-11-30 ENCOUNTER — Other Ambulatory Visit: Payer: Self-pay

## 2023-11-30 LAB — CMP14+EGFR
ALT: 11 IU/L (ref 0–32)
AST: 19 IU/L (ref 0–40)
Albumin: 4.1 g/dL (ref 3.9–4.9)
Alkaline Phosphatase: 101 IU/L (ref 49–135)
BUN/Creatinine Ratio: 19 (ref 12–28)
BUN: 46 mg/dL — ABNORMAL HIGH (ref 8–27)
Bilirubin Total: 0.5 mg/dL (ref 0.0–1.2)
CO2: 23 mmol/L (ref 20–29)
Calcium: 9.5 mg/dL (ref 8.7–10.3)
Chloride: 99 mmol/L (ref 96–106)
Creatinine, Ser: 2.39 mg/dL — ABNORMAL HIGH (ref 0.57–1.00)
Globulin, Total: 2.5 g/dL (ref 1.5–4.5)
Glucose: 124 mg/dL — ABNORMAL HIGH (ref 70–99)
Potassium: 3.4 mmol/L — ABNORMAL LOW (ref 3.5–5.2)
Sodium: 139 mmol/L (ref 134–144)
Total Protein: 6.6 g/dL (ref 6.0–8.5)
eGFR: 21 mL/min/1.73 — ABNORMAL LOW (ref >59)

## 2023-12-11 ENCOUNTER — Other Ambulatory Visit (HOSPITAL_COMMUNITY): Payer: Self-pay

## 2023-12-11 ENCOUNTER — Other Ambulatory Visit: Payer: Self-pay

## 2023-12-29 NOTE — Progress Notes (Signed)
 Adrienne [name]                                          MRN: [id number]   12/29/2023   The [facility] Quality Team Specialist reviewed this patient medical record for the purposes of chart review for care gap closure. The following were reviewed: chart review for care gap closure-kidney health evaluation for diabetes:eGFR  and uACR.    [facility] Quality Team

## 2024-01-01 ENCOUNTER — Other Ambulatory Visit (HOSPITAL_COMMUNITY): Payer: Self-pay

## 2024-01-01 ENCOUNTER — Other Ambulatory Visit: Payer: Self-pay

## 2024-01-09 ENCOUNTER — Other Ambulatory Visit (HOSPITAL_COMMUNITY): Payer: Self-pay

## 2024-01-15 ENCOUNTER — Other Ambulatory Visit (HOSPITAL_COMMUNITY): Payer: Self-pay

## 2024-01-19 ENCOUNTER — Other Ambulatory Visit (HOSPITAL_COMMUNITY): Payer: Self-pay

## 2024-01-22 ENCOUNTER — Other Ambulatory Visit: Payer: Self-pay

## 2024-01-22 ENCOUNTER — Telehealth: Payer: Self-pay | Admitting: Family Medicine

## 2024-01-22 ENCOUNTER — Other Ambulatory Visit (HOSPITAL_COMMUNITY): Payer: Self-pay

## 2024-01-22 DIAGNOSIS — E1122 Type 2 diabetes mellitus with diabetic chronic kidney disease: Secondary | ICD-10-CM

## 2024-01-22 NOTE — Telephone Encounter (Signed)
 Copied from CRM [id number]. Topic: Referral - Request for Referral >> Jan 22, 2024  9:07 AM Rosaria BRAVO wrote:  Did the patient discuss referral with their provider in the last year? Yes (If No - schedule appointment) (If Yes - send message)  Appointment offered? Yes  Type of order/referral and detailed reason for visit: Nephrology, has seen PCP.   Preference of office, provider, location: [facility]  If referral order, have you been seen by this specialty before? Yes (If Yes, this issue or another issue? When? Where?  3 years ago, at [facility]   Can we respond through MyChart? No

## 2024-01-23 ENCOUNTER — Other Ambulatory Visit (HOSPITAL_BASED_OUTPATIENT_CLINIC_OR_DEPARTMENT_OTHER): Payer: Self-pay

## 2024-01-23 ENCOUNTER — Other Ambulatory Visit (HOSPITAL_COMMUNITY): Payer: Self-pay

## 2024-01-23 NOTE — Telephone Encounter (Signed)
 Patient is needing new referral to [facility].

## 2024-01-24 NOTE — Addendum Note (Signed)
 Addended by: Eliah Marquard on: 01/24/2024 12:26 PM   Modules accepted: Orders

## 2024-01-24 NOTE — Telephone Encounter (Signed)
 Referral has been placed.

## 2024-01-25 NOTE — Telephone Encounter (Signed)
Patient has been informed of referral being placed

## 2024-01-26 NOTE — Progress Notes (Signed)
 Adrienne [name]                                          MRN: [id number]   01/26/2024   The [facility] Quality Team Specialist reviewed this patient medical record for the purposes of chart review for care gap closure. The following were reviewed: chart review for care gap closure-diabetic eye exam and kidney health evaluation for diabetes:eGFR  and uACR.    [facility] Quality Team

## 2024-02-06 ENCOUNTER — Other Ambulatory Visit (HOSPITAL_COMMUNITY): Payer: Self-pay

## 2024-02-07 NOTE — Progress Notes (Signed)
 Adrienne [name]                                          MRN: [id number]   02/07/2024   The [facility] Quality Team Specialist reviewed this patient medical record for the purposes of chart review for care gap closure. The following were reviewed: chart review for care gap closure-kidney health evaluation for diabetes:eGFR  and uACR.    [facility] Quality Team

## 2024-02-20 ENCOUNTER — Other Ambulatory Visit: Payer: Self-pay

## 2024-03-04 ENCOUNTER — Ambulatory Visit: Admitting: Family Medicine

## 2024-05-21 ENCOUNTER — Ambulatory Visit
